# Patient Record
Sex: Female | Born: 1950 | Race: White | Hispanic: No | Marital: Married | Smoking: Current every day smoker
Health system: Southern US, Community
[De-identification: ages and names within clinical notes are randomized; demographics above are authoritative.]

## PROBLEM LIST (undated history)

## (undated) DIAGNOSIS — M797 Fibromyalgia: Secondary | ICD-10-CM

## (undated) DIAGNOSIS — F419 Anxiety disorder, unspecified: Secondary | ICD-10-CM

## (undated) DIAGNOSIS — E079 Disorder of thyroid, unspecified: Secondary | ICD-10-CM

## (undated) DIAGNOSIS — K219 Gastro-esophageal reflux disease without esophagitis: Secondary | ICD-10-CM

## (undated) DIAGNOSIS — IMO0002 Reserved for concepts with insufficient information to code with codable children: Secondary | ICD-10-CM

## (undated) DIAGNOSIS — E785 Hyperlipidemia, unspecified: Secondary | ICD-10-CM

## (undated) DIAGNOSIS — Z5189 Encounter for other specified aftercare: Secondary | ICD-10-CM

## (undated) DIAGNOSIS — M810 Age-related osteoporosis without current pathological fracture: Secondary | ICD-10-CM

## (undated) DIAGNOSIS — E119 Type 2 diabetes mellitus without complications: Secondary | ICD-10-CM

## (undated) DIAGNOSIS — F32A Depression, unspecified: Secondary | ICD-10-CM

## (undated) DIAGNOSIS — M1990 Unspecified osteoarthritis, unspecified site: Secondary | ICD-10-CM

## (undated) DIAGNOSIS — M329 Systemic lupus erythematosus, unspecified: Secondary | ICD-10-CM

## (undated) DIAGNOSIS — I10 Essential (primary) hypertension: Secondary | ICD-10-CM

## (undated) HISTORY — DX: Encounter for other specified aftercare: Z51.89

## (undated) HISTORY — DX: Unspecified osteoarthritis, unspecified site: M19.90

## (undated) HISTORY — DX: Age-related osteoporosis without current pathological fracture: M81.0

## (undated) HISTORY — DX: Anxiety disorder, unspecified: F41.9

## (undated) HISTORY — DX: Type 2 diabetes mellitus without complications: E11.9

## (undated) HISTORY — DX: Essential (primary) hypertension: I10

## (undated) HISTORY — DX: Gastro-esophageal reflux disease without esophagitis: K21.9

## (undated) HISTORY — DX: Fibromyalgia: M79.7

## (undated) HISTORY — DX: Hyperlipidemia, unspecified: E78.5

## (undated) HISTORY — DX: Disorder of thyroid, unspecified: E07.9

---

## 1991-11-07 HISTORY — PX: OTHER SURGICAL HISTORY: SHX169

## 2020-05-06 ENCOUNTER — Telehealth: Payer: Self-pay | Admitting: Endocrinology

## 2020-05-06 NOTE — Telephone Encounter (Signed)
Patient states she has only discussed Calcium with her PCP Dr Nyra Capes, and she has only been seeing him for less than a year. Patient moved here from [place], but Dr Dwyane Dee needed lab results for the past 2 years for Calcium for this patient and shes only had her labs done at Dr Nyra Capes office. Asked if Mrs Jasmine Keith could get those results please.   Dr Maryella Shivers Phone: [phone] [address] [place], [place] [address]

## 2020-05-07 NOTE — Telephone Encounter (Signed)
Called pt and got her old PCP providers name. His name is Dr. Boykin Peek and he is located in [place], [place] at the [facility].Their number is [phone] and fax number is [phone]. I called and spoke with Medical Records and they stated the way in which to obtain these labs needs to be a faxed request. This will be done today.

## 2020-05-14 ENCOUNTER — Ambulatory Visit: Payer: Medicare Other | Admitting: Endocrinology

## 2020-05-31 ENCOUNTER — Encounter: Payer: Self-pay | Admitting: Endocrinology

## 2020-05-31 ENCOUNTER — Other Ambulatory Visit: Payer: Self-pay

## 2020-05-31 ENCOUNTER — Ambulatory Visit (INDEPENDENT_AMBULATORY_CARE_PROVIDER_SITE_OTHER): Payer: Medicare Other | Admitting: Endocrinology

## 2020-05-31 DIAGNOSIS — Z780 Asymptomatic menopausal state: Secondary | ICD-10-CM

## 2020-05-31 NOTE — Progress Notes (Signed)
Patient ID: Jasmine Keith, female   DOB: January 22, 1951, 69 y.o.   MRN: [id number]            Chief complaint: High calcium  History of Present Illness:  Referring physician:   Review of records show that she has had a high calcium since at least 12/2018 At that time it was slightly above normal at 10.5 She also had a 11.0 calcium on 06/26/2019 Most recent is 11.2 done on 04/09/2020, at the same time albumin is 4.9  No results found for: CALCIUM  The hypercalcemia is not associated with any history of fractures, kidney problems, nephrolithiasis, sarcoidosis, known carcinoma or thyroid disease  She has had some pain in her lower back and sides and upper thighs but not any known fracture Has not had any history of height loss He did not take any calcium supplements or any herbal remedies  Prior PTH level in 2020 was 34, repeat 32, probably from [place] with upper normal 65  No results found for: PTH, CALCIUM, CAION, PHOS    25 (OH) Vitamin D level not available  No results found for: VD25OH  She has not had a bone density done  Allergies as of 05/31/2020   Not on File     Medication List       Accurate as of May 31, 2020  4:55 PM. If you have any questions, ask your nurse or doctor.        amLODipine 10 MG tablet Commonly known as: NORVASC Take 10 mg by mouth daily.   busPIRone 5 MG tablet Commonly known as: BUSPAR Take 5 mg by mouth daily.   fenofibrate 160 MG tablet Take 160 mg by mouth daily.   gabapentin 800 MG tablet Commonly known as: NEURONTIN Take 800 mg by mouth 2 (two) times daily.   LEVEMIR FLEXPEN [place] Inject 36 Units into the skin daily.   losartan 100 MG tablet Commonly known as: COZAAR Take 100 mg by mouth daily.   meclizine 25 MG tablet Commonly known as: ANTIVERT Take 25 mg by mouth as needed for dizziness.   metFORMIN 1000 MG tablet Commonly known as: GLUCOPHAGE Take 1,000 mg by mouth 2 (two) times daily with a meal.   nortriptyline  50 MG capsule Commonly known as: PAMELOR Take 50 mg by mouth at bedtime.   pantoprazole 40 MG tablet Commonly known as: PROTONIX Take 40 mg by mouth daily.   pravastatin 10 MG tablet Commonly known as: PRAVACHOL Take 10 mg by mouth daily.       Allergies: Not on File  History reviewed. No pertinent past medical history.  History reviewed. No pertinent surgical history.  Family History  Problem Relation Age of Onset  . Diabetes Mother   . Kidney disease Mother   . Diabetes Father   . Cancer Sister        lung  . Kidney disease Sister     Social History:  reports that she has been smoking cigarettes. She has been smoking about 0.50 packs per day. She has never used smokeless tobacco. No history on file for alcohol use and drug use.  Review of Systems  Constitutional: Negative for weight loss and weight gain.  Eyes: Negative for blurred vision.  Cardiovascular: Negative for leg swelling.  Gastrointestinal: Negative for constipation and diarrhea.  Endocrine: Negative for general weakness.       Has had diabetes 10 yrs..  Managed by PCP, currently on Levemir insulin with Metformin.  Last A1c  7.1 in 6/21  Musculoskeletal: Positive for joint pain.  Neurological: Negative for numbness and tingling.     EXAM:  BP (!) 140/72 (BP Location: Left Arm, Patient Position: Sitting, Cuff Size: Normal)   Pulse 93   Ht 5\' 1"  (1.549 m)   Wt 114 lb 12.8 oz (52.1 kg)   SpO2 96%   BMI 21.69 kg/m   GENERAL: Averagely built and nourished  No pallor, clubbing, lymphadenopathy or edema.    Skin:  no rash or pigmentation.  EYES:  Externally normal.   ENT: Exam deferred.  THYROID:  Not palpable.   HEART:  Normal  S1 and S2; no murmur or click.  CHEST:  Normal shape Lungs:   Vescicular breath sounds heard equally.  No crepitations/ wheeze.  ABDOMEN:  No distention.  Liver and spleen not palpable.   No other mass or tenderness.  NEUROLOGICAL: .Reflexes are normal  bilaterally at biceps.  SPINE AND JOINTS: Spine appears normal to inspection  Some [place] nodes present on the fingers.  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  Discussed potential  effects of hyperparathyroidism long-term on bone health, kidney stones and kidney function Explained to patient that surgery is indicated only there are symptoms of high calcium, calcium level over 1 point above the normal range or known osteoporosis.  Plan:  Check ionized calcium  Repeat PTH  Check vitamin D level  Bone density  Further management will depend on results of current evaluation  DIABETES: She is being followed by PCP and is asking about her A1c targets and this was explained Also explained to her that Medicare will not cover CGM like freestyle libre unless she is on multiple insulin injections and requiring more frequent monitoring  Jasmine Keith 05/31/2020, 4:55 PM

## 2020-06-02 LAB — PTH, INTACT AND CALCIUM
Calcium: 10.7 mg/dL — ABNORMAL HIGH (ref 8.7–10.3)
PTH: 45 pg/mL (ref 15–65)

## 2020-06-02 LAB — CALCIUM, IONIZED: Calcium, Ion: 5.8 mg/dL — ABNORMAL HIGH (ref 4.5–5.6)

## 2020-06-23 ENCOUNTER — Ambulatory Visit (INDEPENDENT_AMBULATORY_CARE_PROVIDER_SITE_OTHER)
Admission: RE | Admit: 2020-06-23 | Discharge: 2020-06-23 | Disposition: A | Discharge status: Home / Self Care | Payer: Medicare Other | Source: Ambulatory Visit | Attending: Endocrinology | Admitting: Endocrinology

## 2020-06-23 ENCOUNTER — Other Ambulatory Visit: Payer: Self-pay

## 2020-06-23 DIAGNOSIS — Z780 Asymptomatic menopausal state: Secondary | ICD-10-CM | POA: Diagnosis not present

## 2020-07-05 NOTE — Progress Notes (Signed)
Please call to let patient know that the bone density shows borderline osteoporosis at the hip.  Recommend starting alendronate 70 mg weekly for reducing fracture risk in the future.  This will be taken with water on empty stomach.  Can discuss further if needed

## 2020-07-08 ENCOUNTER — Encounter: Payer: Self-pay | Admitting: *Deleted

## 2020-07-13 ENCOUNTER — Other Ambulatory Visit: Payer: Self-pay | Admitting: *Deleted

## 2020-07-13 MED ORDER — ALENDRONATE SODIUM 70 MG PO TABS: 70.0000 mg | ORAL_TABLET | ORAL | 0 refills | Status: DC

## 2020-07-13 NOTE — Telephone Encounter (Signed)
Patient called regarding lab results letter.Prescription sent .  Patient verbalized understanding and would like to know if scan showed anything to worry about with her thyroid.

## 2020-07-23 ENCOUNTER — Encounter: Payer: Self-pay | Admitting: Medical

## 2020-07-23 ENCOUNTER — Other Ambulatory Visit: Payer: Self-pay

## 2020-07-23 ENCOUNTER — Ambulatory Visit (INDEPENDENT_AMBULATORY_CARE_PROVIDER_SITE_OTHER): Payer: Medicare Other | Admitting: Medical

## 2020-07-23 ENCOUNTER — Ambulatory Visit (HOSPITAL_BASED_OUTPATIENT_CLINIC_OR_DEPARTMENT_OTHER)
Admission: RE | Admit: 2020-07-23 | Discharge: 2020-07-23 | Disposition: A | Discharge status: Home / Self Care | Payer: Medicare Other | Source: Ambulatory Visit | Attending: Medical | Admitting: Medical

## 2020-07-23 VITALS — BP 133/65 | HR 94 | Temp 98.7°F | Resp 18 | Ht 61.0 in | Wt 116.2 lb

## 2020-07-23 DIAGNOSIS — E119 Type 2 diabetes mellitus without complications: Secondary | ICD-10-CM | POA: Diagnosis not present

## 2020-07-23 DIAGNOSIS — M069 Rheumatoid arthritis, unspecified: Secondary | ICD-10-CM

## 2020-07-23 DIAGNOSIS — F419 Anxiety disorder, unspecified: Secondary | ICD-10-CM

## 2020-07-23 DIAGNOSIS — I10 Essential (primary) hypertension: Secondary | ICD-10-CM | POA: Diagnosis not present

## 2020-07-23 DIAGNOSIS — M25559 Pain in unspecified hip: Secondary | ICD-10-CM | POA: Diagnosis present

## 2020-07-23 DIAGNOSIS — M797 Fibromyalgia: Secondary | ICD-10-CM

## 2020-07-23 DIAGNOSIS — R61 Generalized hyperhidrosis: Secondary | ICD-10-CM

## 2020-07-23 DIAGNOSIS — M810 Age-related osteoporosis without current pathological fracture: Secondary | ICD-10-CM

## 2020-07-23 DIAGNOSIS — G4700 Insomnia, unspecified: Secondary | ICD-10-CM

## 2020-07-23 DIAGNOSIS — K219 Gastro-esophageal reflux disease without esophagitis: Secondary | ICD-10-CM

## 2020-07-23 DIAGNOSIS — E785 Hyperlipidemia, unspecified: Secondary | ICD-10-CM

## 2020-07-23 MED ORDER — ALENDRONATE SODIUM 70 MG PO TABS: 70.0000 mg | ORAL_TABLET | ORAL | 3 refills | Status: DC

## 2020-07-23 NOTE — Progress Notes (Signed)
   Subjective:    Patient ID: Jasmine Keith, female    DOB: 09-12-1951, 69 y.o.   MRN: [id number]  HPI  Pt in for first time.  Pt is retired. Was working in nursing facility. She does not exercise.  Pt states diet healthy most of the time. 2 cups of coffee in am. Pt current smoker. Hx of smoker since 69 yo. Pack a day on average. Rare alcohol use.    Pt states rt hip pain prevents her from exercise.   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 Continue meloxicam.  Also hx of fibromyalgia.  Hx of htn. Continue amlodipine   For gerd on protonix.   For high choleterol on fenofribtate and pravastin.  For insomnia continue pamelor.  For anxiety continue buspar.  For osteoporosis rx fosamax.  Pt has diabetes. Pt is on levemir 36 units at night. Pt not sure when a1c was. She   Pt has had covid vacine.    Review of Systems     Objective:   Physical Exam  General Mental Status- Alert. General Appearance- Not in acute distress.   Skin General: Color- Normal Color. Moisture- Normal Moisture.  Neck  No JVD. Chest and Lung Exam Auscultation: Breath Sounds:-Normal.  Cardiovascular Auscultation:Rythm- Regular. Murmurs & Other Heart Sounds:Auscultation of the heart reveals- No Murmurs.  Abdomen Inspection:-Inspeection Normal. Palpation/Percussion:Note:No mass. Palpation and Percussion of the abdomen reveal- Non Tender, Non Distended + BS, no rebound or guarding.    Neurologic Cranial Nerve exam:- CN III-XII intact(No nystagmus), symmetric smile. Strength:- 5/5 equal and symmetric strength both upper and lower extremities.  Right hip-pain on range of motion.     Assessment & Plan:  Nice to meet you.  History of hypertension.  Blood pressure well controlled today.  Continue amlodipine.  History of osteoporosis.  Recent your Fosamax prescription to your pharmacy.  History of diabetes but I do not know when your last A1c was.  You report last A1c was  just above 7.0.  Continue current Levemir.  I want you to call your prior PCP and find out date of last A1c.  If more than 90 days then go ahead and get scheduled for CMP, lipid panel, A1c and CBC.  Want the labs to be done fasting.  The need to get scheduled for those labs.  For anxiety which is presently controlled recommend continue BuSpar.  For fibromyalgia history continue gabapentin.  For GERD continue Protonix.  For history of insomnia continue Pamelor.  High cholesterol continue fenofibrate and pravastatin.  We will follow fasting lipid panel when you get that done.    For right hip pain we will go ahead and get his right hip x-ray today.  You mention at the end of the interview random sweating during the day.  When asked that you check your temperature to see if you have a fever.  Need to know this first as this would change work-up.  Also go ahead and get CBC to assess your infection fighting cells.  For history of smoking strongly recommend discontinuing smoking.  Follow-up in 2 months or as needed.  Time spent with patient today was 47 minutes which consisted of chart rediew, discussing diagnoses, work up,  Treatment, answering various questions and documentation.

## 2020-07-23 NOTE — Patient Instructions (Addendum)
Nice to meet you.  History of hypertension.  Blood pressure well controlled today.  Continue amlodipine.  History of osteoporosis.  Recent your Fosamax prescription to your pharmacy.  History of diabetes but I do not know when your last A1c was.  You report last A1c was just above 7.0.  Continue current Levemir.  I want you to call your prior PCP and find out date of last A1c.  If more than 90 days then go ahead and get scheduled for CMP, lipid panel, A1c and CBC.  Want the labs to be done fasting.  The need to get scheduled for those labs.  For anxiety which is presently controlled recommend continue BuSpar.  For fibromyalgia history continue gabapentin.  For GERD continue Protonix.  For history of insomnia continue Pamelor.  High cholesterol continue fenofibrate and pravastatin.  We will follow fasting lipid panel when you get that done.    For right hip pain we will go ahead and get his right hip x-ray today.  You mention at the end of the interview random sweating during the day.  When asked that you check your temperature to see if you have a fever.  Need to know this first as this would change work-up.  Also go ahead and get CBC to assess your infection fighting cells.  For history of smoking strongly recommend discontinuing smoking.  Follow-up in 2 months or as needed.

## 2020-09-22 ENCOUNTER — Encounter: Payer: Self-pay | Admitting: Medical

## 2020-09-22 ENCOUNTER — Other Ambulatory Visit: Payer: Self-pay

## 2020-09-22 ENCOUNTER — Ambulatory Visit (INDEPENDENT_AMBULATORY_CARE_PROVIDER_SITE_OTHER): Payer: Medicare Other | Admitting: Medical

## 2020-09-22 VITALS — BP 150/70 | HR 97 | Resp 18 | Ht 61.0 in | Wt 114.0 lb

## 2020-09-22 DIAGNOSIS — I10 Essential (primary) hypertension: Secondary | ICD-10-CM

## 2020-09-22 DIAGNOSIS — M810 Age-related osteoporosis without current pathological fracture: Secondary | ICD-10-CM

## 2020-09-22 DIAGNOSIS — E119 Type 2 diabetes mellitus without complications: Secondary | ICD-10-CM

## 2020-09-22 DIAGNOSIS — M797 Fibromyalgia: Secondary | ICD-10-CM | POA: Diagnosis not present

## 2020-09-22 DIAGNOSIS — E785 Hyperlipidemia, unspecified: Secondary | ICD-10-CM

## 2020-09-22 MED ORDER — INSULIN DETEMIR 100 UNIT/ML FLEXPEN: SUBCUTANEOUS | 0 refills | Status: DC

## 2020-09-22 MED ORDER — BUSPIRONE HCL 7.5 MG PO TABS: 7.5000 mg | ORAL_TABLET | Freq: Two times a day (BID) | ORAL | 0 refills | Status: DC

## 2020-09-22 MED ORDER — METOPROLOL SUCCINATE ER 25 MG PO TB24: 25.0000 mg | ORAL_TABLET | Freq: Every day | ORAL | 3 refills | Status: DC

## 2020-09-22 NOTE — Progress Notes (Signed)
Subjective:    Patient ID: Jasmine Keith, female    DOB: [birth year], 69 y.o.   MRN: [id number]  HPI Pt in for follow up.  Pt has htn. BP high here. Has not been checking her bp at home. They think machine 68 year old. On amlodipine 10 mg daily.  Pt has diabetes last a1c was 7.1 early June. On levemir 36 units at night.    Pt has osteoporosis. Pt did get fosamax filled.  Pt has anxiety. She is on buspar 5 mg twice a day. Started by Dr. Nyra Capes. She states nervous person. She thinks anxiety not controlled.  Has fibromyalgia and use gabapentin 800 mg twice daily.  Has high cholesterol. On fenofibrate.    Review of Systems  Constitutional: Negative for activity change, chills, diaphoresis, fatigue and fever.  Respiratory: Negative for cough, chest tightness and shortness of breath.   Cardiovascular: Negative for chest pain, palpitations and leg swelling.  Gastrointestinal: Negative for abdominal pain, blood in stool, nausea and vomiting.  Genitourinary: Negative for dysuria.  Musculoskeletal: Negative for back pain, neck pain and neck stiffness.  Neurological: Negative for dizziness, seizures, weakness and headaches.  Psychiatric/Behavioral: Negative for agitation, behavioral problems, confusion, dysphoric mood, sleep disturbance and suicidal ideas. The patient is nervous/anxious.    Past Medical History:  Diagnosis Date  . Anxiety   . Diabetes mellitus without complication ([place])   . Fibromyalgia   . GERD (gastroesophageal reflux disease)   . Hyperlipidemia   . Hypertension   . Osteoporosis      Social History   Socioeconomic History  . Marital status: Married    Spouse name: Not on file  . Number of children: Not on file  . Years of education: Not on file  . Highest education level: Not on file  Occupational History  . Not on file  Tobacco Use  . Smoking status: Current Every Day Smoker    Packs/day: 0.50    Types: Cigarettes  . Smokeless tobacco: Never  Used  Substance and Sexual Activity  . Alcohol use: Yes    Comment: Rare  . Drug use: Not on file  . Sexual activity: Not on file  Other Topics Concern  . Not on file  Social History Narrative  . Not on file   Social Determinants of Health   Financial Resource Strain:   . Difficulty of Paying Living Expenses: Not on file  Food Insecurity:   . Worried About Charity fundraiser in the Last Year: Not on file  . Ran Out of Food in the Last Year: Not on file  Transportation Needs:   . Lack of Transportation (Medical): Not on file  . Lack of Transportation (Non-Medical): Not on file  Physical Activity:   . Days of Exercise per Week: Not on file  . Minutes of Exercise per Session: Not on file  Stress:   . Feeling of Stress : Not on file  Social Connections:   . Frequency of Communication with Friends and Family: Not on file  . Frequency of Social Gatherings with Friends and Family: Not on file  . Attends Religious Services: Not on file  . Active Member of Clubs or Organizations: Not on file  . Attends Archivist Meetings: Not on file  . Marital Status: Not on file  Intimate Partner Violence:   . Fear of Current or Ex-Partner: Not on file  . Emotionally Abused: Not on file  . Physically Abused: Not on file  .  Sexually Abused: Not on file    No past surgical history on file.  Family History  Problem Relation Age of Onset  . Diabetes Mother   . Kidney disease Mother   . Diabetes Father   . Cancer Sister        lung  . Kidney disease Sister     Not on File  Current Outpatient Medications on File Prior to Visit  Medication Sig Dispense Refill  . alendronate (FOSAMAX) 70 MG tablet Take 1 tablet (70 mg total) by mouth once a week. Take with a full glass of water on an empty stomach. 12 tablet 3  . amLODipine (NORVASC) 10 MG tablet Take 10 mg by mouth daily.    . busPIRone (BUSPAR) 5 MG tablet Take 5 mg by mouth daily.    . fenofibrate 160 MG tablet Take 160 mg  by mouth daily.    Marland Kitchen gabapentin (NEURONTIN) 800 MG tablet Take 800 mg by mouth 2 (two) times daily.    . Insulin Detemir (LEVEMIR FLEXPEN [place]) Inject 36 Units into the skin daily.    Marland Kitchen losartan (COZAAR) 100 MG tablet Take 100 mg by mouth daily.    . meclizine (ANTIVERT) 25 MG tablet Take 25 mg by mouth as needed for dizziness.    . meloxicam (MOBIC) 7.5 MG tablet Take 7.5 mg by mouth daily as needed.    . metFORMIN (GLUCOPHAGE) 1000 MG tablet Take 1,000 mg by mouth 2 (two) times daily with a meal.    . nortriptyline (PAMELOR) 50 MG capsule Take 50 mg by mouth at bedtime.    . pantoprazole (PROTONIX) 40 MG tablet Take 40 mg by mouth daily.    . pravastatin (PRAVACHOL) 10 MG tablet Take 10 mg by mouth daily.     No current facility-administered medications on file prior to visit.    BP (!) 150/70   Pulse 97   Resp 18   Ht 5\' 1"  (1.549 m)   Wt 114 lb (51.7 kg)   SpO2 99%   BMI 21.54 kg/m      Objective:   Physical Exam  General Mental Status- Alert. General Appearance- Not in acute distress.   Skin General: Color- Normal Color. Moisture- Normal Moisture.  Neck Carotid Arteries- Normal color. Moisture- Normal Moisture. No carotid bruits. No JVD.  Chest and Lung Exam Auscultation: Breath Sounds:-Normal.  Cardiovascular Auscultation:Rythm- Regular. Murmurs & Other Heart Sounds:Auscultation of the heart reveals- No Murmurs.  Abdomen Inspection:-Inspeection Normal. Palpation/Percussion:Note:No mass. Palpation and Percussion of the abdomen reveal- Non Tender, Non Distended + BS, no rebound or guarding.    Neurologic Cranial Nerve exam:- CN III-XII intact(No nystagmus), symmetric smile. Drift Test:- No drift. Romberg Exam:- Negative.  Heal to Toe Gait exam:-Normal. Finger to Nose:- Normal/Intact Strength:- 5/5 equal and symmetric strength both upper and lower extremities.      Assessment & Plan:  Your blood pressure is elevated today.  Not adequately controlled with  losartan and amlodipine.  We will add low-dose metoprolol to your regimen.  He recommended to get new blood pressure cuff and check your blood pressures daily.  Call our office in 1 week and give me update BP reading.  For history of osteoporosis continue on Fosamax.  History of diabetes.  Last A1c in the summer was 7.1.  Will repeat U7O and get metabolic panel today.  Refilled Levemir today.  Follow A1c results and see if we have to increase number of units.  History of fibromyalgia continue with gabapentin.  Anxiety not ideally controlled with current 5 mg BuSpar.  Will increase BuSpar to 7.5 mg twice daily.  For hyperlipidemia continue fenofibrate.  Not fasting today so we will get fasting lipid panel on next visit.  Follow-up date to be determined after lab review.

## 2020-09-22 NOTE — Patient Instructions (Signed)
Your blood pressure is elevated today.  Not adequately controlled with losartan and amlodipine.  We will add low-dose metoprolol to your regimen.  He recommended to get new blood pressure cuff and check your blood pressures daily.  Call our office in 1 week and give me update BP reading.  For history of osteoporosis continue on Fosamax.  History of diabetes.  Last A1c in the summer was 7.1.  Will repeat Z6W and get metabolic panel today.  Refilled Levemir today.  Follow A1c results and see if we have to increase number of units.  History of fibromyalgia continue with gabapentin.  Anxiety not ideally controlled with current 5 mg BuSpar.  Will increase BuSpar to 7.5 mg twice daily.  For hyperlipidemia continue fenofibrate.  Not fasting today so we will get fasting lipid panel on next visit.  Follow-up date to be determined after lab review.

## 2020-09-23 ENCOUNTER — Telehealth: Payer: Self-pay | Admitting: Medical

## 2020-09-23 LAB — COMPREHENSIVE METABOLIC PANEL
ALT: 16 U/L (ref 0–35)
AST: 20 U/L (ref 0–37)
Albumin: 4.6 g/dL (ref 3.5–5.2)
Alkaline Phosphatase: 43 U/L (ref 39–117)
BUN: 20 mg/dL (ref 6–23)
CO2: 24 mEq/L (ref 19–32)
Calcium: 10.8 mg/dL — ABNORMAL HIGH (ref 8.4–10.5)
Chloride: 103 mEq/L (ref 96–112)
Creatinine, Ser: 1.03 mg/dL (ref 0.40–1.20)
GFR: 56.11 mL/min — ABNORMAL LOW (ref >60.00)
Glucose, Bld: 166 mg/dL — ABNORMAL HIGH (ref 70–99)
Potassium: 4.8 mEq/L (ref 3.5–5.1)
Sodium: 137 mEq/L (ref 135–145)
Total Bilirubin: 0.4 mg/dL (ref 0.2–1.2)
Total Protein: 7.2 g/dL (ref 6.0–8.3)

## 2020-09-23 LAB — HEMOGLOBIN A1C: Hgb A1c MFr Bld: 6.7 % — ABNORMAL HIGH (ref 4.6–6.5)

## 2020-09-23 NOTE — Telephone Encounter (Signed)
Future pth and calcium placed.

## 2020-10-15 ENCOUNTER — Other Ambulatory Visit: Payer: Self-pay | Admitting: Medical

## 2020-11-29 ENCOUNTER — Telehealth: Payer: Self-pay | Admitting: Endocrinology

## 2020-11-29 ENCOUNTER — Telehealth: Payer: Self-pay | Admitting: Medical

## 2020-11-29 ENCOUNTER — Other Ambulatory Visit: Payer: Self-pay | Admitting: *Deleted

## 2020-11-29 MED ORDER — LOSARTAN POTASSIUM 100 MG PO TABS: 100.0000 mg | ORAL_TABLET | Freq: Every day | ORAL | 3 refills | Status: DC

## 2020-11-29 MED ORDER — NORTRIPTYLINE HCL 50 MG PO CAPS
50.0000 mg | ORAL_CAPSULE | Freq: Every day | ORAL | 0 refills | Status: DC
Start: 2020-11-29 — End: 2021-03-09

## 2020-11-29 MED ORDER — GABAPENTIN 800 MG PO TABS: 800.0000 mg | ORAL_TABLET | Freq: Two times a day (BID) | ORAL | 2 refills | Status: DC

## 2020-11-29 MED ORDER — INSULIN DETEMIR 100 UNIT/ML FLEXPEN: SUBCUTANEOUS | 0 refills | Status: DC

## 2020-11-29 MED ORDER — METFORMIN HCL 1000 MG PO TABS: 1000.0000 mg | ORAL_TABLET | Freq: Two times a day (BID) | ORAL | 3 refills | Status: DC

## 2020-11-29 NOTE — Telephone Encounter (Signed)
All her medications are being prescribed by PCP

## 2020-11-29 NOTE — Telephone Encounter (Signed)
Noted  

## 2020-11-29 NOTE — Telephone Encounter (Signed)
Medication: insulin detemir (LEVEMIR) 100 unit/ml SOLN [[id number]  insulin detemir (LEVEMIR) 100 unit/ml SOLN    Has the patient contacted their pharmacy? No. (If no, request that the patient contact the pharmacy for the refill.) (If yes, when and what did the pharmacy advise?)  Preferred Pharmacy (with phone number or street name):  [id number] - [place], [address] Phone:  [phone]  Fax:  [phone]       Agent: Please be advised that RX refills may take up to 3 business days. We ask that you follow-up with your pharmacy.

## 2020-11-29 NOTE — Telephone Encounter (Signed)
Refilled pt losartan, gabapentin, pamelor and metformin. Please get her to follow up with me by end of February.

## 2020-11-29 NOTE — Telephone Encounter (Signed)
Patient called request new RX's for the following medications: metFORMIN (GLUCOPHAGE) 1000 MG tablet  gabapentin (NEURONTIN) 800 MG tablet  nortriptyline (PAMELOR) 50 MG capsule   AND   losartan (COZAAR) 100 MG tablet  Be sent to:  [id number] - [place], [place] - [place] [address] Phone:  [phone]  Fax:  [phone]

## 2020-11-29 NOTE — Telephone Encounter (Signed)
I do not see that you have prescribed any of these medications for this patient before?  Please advise if okay to fill or deny?

## 2020-11-29 NOTE — Addendum Note (Signed)
Addended by: Jeronimo Greaves on: 11/29/2020 01:35 PM   Modules accepted: Orders

## 2020-11-29 NOTE — Addendum Note (Signed)
Addended by: Anabel Halon on: 11/29/2020 05:26 PM   Modules accepted: Orders

## 2020-11-29 NOTE — Telephone Encounter (Signed)
Rx sent 

## 2020-11-30 NOTE — Telephone Encounter (Signed)
Called pt and lvm to return call.  

## 2020-12-01 NOTE — Telephone Encounter (Signed)
Pt scheduled 12/29/2020.

## 2020-12-02 ENCOUNTER — Other Ambulatory Visit: Payer: Self-pay | Admitting: *Deleted

## 2020-12-02 ENCOUNTER — Ambulatory Visit (INDEPENDENT_AMBULATORY_CARE_PROVIDER_SITE_OTHER): Payer: Medicare Other | Admitting: Endocrinology

## 2020-12-02 ENCOUNTER — Encounter: Payer: Self-pay | Admitting: Endocrinology

## 2020-12-02 ENCOUNTER — Other Ambulatory Visit: Payer: Self-pay

## 2020-12-02 DIAGNOSIS — R5383 Other fatigue: Secondary | ICD-10-CM

## 2020-12-02 DIAGNOSIS — M85859 Other specified disorders of bone density and structure, unspecified thigh: Secondary | ICD-10-CM | POA: Insufficient documentation

## 2020-12-02 HISTORY — DX: Other fatigue: R53.83

## 2020-12-02 HISTORY — DX: Hypercalcemia: E83.52

## 2020-12-02 HISTORY — DX: Other specified disorders of bone density and structure, unspecified thigh: M85.859

## 2020-12-02 LAB — BASIC METABOLIC PANEL
BUN: 16 mg/dL (ref 6–23)
CO2: 26 mEq/L (ref 19–32)
Calcium: 10 mg/dL (ref 8.4–10.5)
Chloride: 106 mEq/L (ref 96–112)
Creatinine, Ser: 0.92 mg/dL (ref 0.40–1.20)
GFR: 64.16 mL/min (ref >60.00)
Glucose, Bld: 161 mg/dL — ABNORMAL HIGH (ref 70–99)
Potassium: 3.9 mEq/L (ref 3.5–5.1)
Sodium: 139 mEq/L (ref 135–145)

## 2020-12-02 LAB — VITAMIN D 25 HYDROXY (VIT D DEFICIENCY, FRACTURES): VITD: 29.84 ng/mL — ABNORMAL LOW (ref 30.00–100.00)

## 2020-12-02 LAB — TSH: TSH: 4.72 u[IU]/mL — ABNORMAL HIGH (ref 0.35–4.50)

## 2020-12-02 LAB — T4, FREE: Free T4: 0.88 ng/dL (ref 0.60–1.60)

## 2020-12-02 MED ORDER — BD PEN NEEDLE NANO U/F 32G X 4 MM MISC: 5 refills | Status: DC

## 2020-12-02 NOTE — Progress Notes (Addendum)
Patient ID: MILIA Keith, female   DOB: 02/11/1951, 70 y.o.   MRN: [id number]            Chief complaint: Follow-up of high calcium  History of Present Illness:    Review of records show that she has had a high calcium since at least 12/2018 At that time it was slightly above normal at 10.5 She also had a 11.0 calcium on 06/26/2019 Highest level is 11.2 done on 04/09/2020, at the same time albumin is 4.9  Lab Results  Component Value Date   CALCIUM 10.8 (H) 09/22/2020   CALCIUM 10.7 (H) 05/31/2020    The hypercalcemia is not associated with any history of fractures, kidney problems, nephrolithiasis, sarcoidosis, known carcinoma or thyroid disease  She does have OSTEOPENIA For this she was started on Fosamax 70 mg weekly in 06/2020  She did not take any calcium supplements or vitamin D  Previous PTH level in 2020 was 34, repeat 32, probably from [name] with upper normal 65  Lab Results  Component Value Date   PTH 45 05/31/2020   PTH Comment 05/31/2020   CALCIUM 10.8 (H) 09/22/2020   CAION 5.8 (H) 05/31/2020      25 (OH) Vitamin D level not available  No results found for: VD25OH  BONE density results:  Results: 06/23/2020 Lumbar spine L1-L4 Femoral neck (FN) 33% left distal radius  T-score -0.3 RFN: -2.2 LFN: -2.3      Allergies as of 12/02/2020   No Known Allergies     Medication List       Accurate as of December 02, 2020  1:39 PM. If you have any questions, ask your nurse or doctor.        alendronate 70 MG tablet Commonly known as: FOSAMAX Take 1 tablet (70 mg total) by mouth once a week. Take with a full glass of water on an empty stomach.   amLODipine 10 MG tablet Commonly known as: NORVASC Take 10 mg by mouth daily.   BD Pen Needle Nano U/F 32G X 4 MM Misc Generic drug: Insulin Pen Needle Use one needle daily to inject insulin What changed:   how to take this  additional instructions Changed by: PEDRAZA, RHONDA, CMA   busPIRone 5 MG  tablet Commonly known as: BUSPAR Take 5 mg by mouth daily.   busPIRone 7.5 MG tablet Commonly known as: BUSPAR TAKE 1 TABLET BY MOUTH 2 TIMES DAILY.   fenofibrate 160 MG tablet Take 160 mg by mouth daily.   gabapentin 800 MG tablet Commonly known as: NEURONTIN Take 1 tablet (800 mg total) by mouth 2 (two) times daily.   LEVEMIR FLEXPEN [place] Inject 36 Units into the skin daily.   insulin detemir 100 unit/ml Soln Commonly known as: LEVEMIR 36 units at night.   losartan 100 MG tablet Commonly known as: COZAAR Take 1 tablet (100 mg total) by mouth daily.   meclizine 25 MG tablet Commonly known as: ANTIVERT Take 25 mg by mouth as needed for dizziness.   meloxicam 7.5 MG tablet Commonly known as: MOBIC Take 7.5 mg by mouth daily as needed.   metFORMIN 1000 MG tablet Commonly known as: GLUCOPHAGE Take 1 tablet (1,000 mg total) by mouth 2 (two) times daily with a meal.   metoprolol succinate 25 MG 24 hr tablet Commonly known as: TOPROL-XL Take 1 tablet (25 mg total) by mouth daily.   nortriptyline 50 MG capsule Commonly known as: PAMELOR Take 1 capsule (50 mg total) by mouth  at bedtime.   pantoprazole 40 MG tablet Commonly known as: PROTONIX Take 40 mg by mouth daily.   pravastatin 10 MG tablet Commonly known as: PRAVACHOL Take 10 mg by mouth daily.       Allergies: No Known Allergies  Past Medical History:  Diagnosis Date  . Anxiety   . Diabetes mellitus without complication ([place])   . Fibromyalgia   . GERD (gastroesophageal reflux disease)   . Hyperlipidemia   . Hypertension   . Osteoporosis     No past surgical history on file.  Family History  Problem Relation Age of Onset  . Diabetes Mother   . Kidney disease Mother   . Diabetes Father   . Cancer Sister        lung  . Kidney disease Sister     Social History:  reports that she has been smoking cigarettes. She has been smoking about 0.50 packs per day. She has never used smokeless tobacco.  She reports current alcohol use. No history on file for drug use.  Review of Systems   She is being managed by her PCP in follow-up for diabetes on Levemir and Metformin  She thinks her blood sugars are fairly good but checking infrequently  Lab Results  Component Value Date   HGBA1C 6.7 (H) 09/22/2020   Lab Results  Component Value Date   CREATININE 1.03 09/22/2020   FATIGUE: She says she is having significant fatigue and not clear why this is so, no history of anemia No thyroid levels available  No results found for: TSH, FREET4   EXAM:  BP 130/70   Pulse 82   Ht 5\' 1"  (1.549 m)   Wt 117 lb 6.4 oz (53.3 kg)   SpO2 99%   BMI 22.18 kg/m     Assessment/Plan:   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:  She has had mild relatively chronic hypercalcemia with last level under 11  OSTEOPENIA: Her T score is -2.2 at the hip she is on Fosamax prophylactically because of associated hyperparathyroid  Plan:  Check calcium again  Check vitamin D level  Bone density in 06/2022  Continue Fosamax for at least a total of 5 years  DIABETES: She is being followed by PCP and can continue to follow-up with him as scheduled If request for consultation is made by PCP would be glad to see her for this also  FATIGUE: We will rule out hypothyroidism with free T4 and TSH levels  Jasmine Keith 12/02/2020, 1:39 PM   Addendum: Vitamin D level slightly low, start vitamin D3 1000 units daily Also TSH mildly increased, can try levothyroxine 25 mcg daily and follow-up in 2 months with labs  Jasmine Keith Dwyane Dee

## 2020-12-16 ENCOUNTER — Other Ambulatory Visit: Payer: Self-pay | Admitting: Medical

## 2020-12-29 ENCOUNTER — Ambulatory Visit: Payer: BLUE CROSS/BLUE SHIELD | Admitting: Medical

## 2021-01-03 ENCOUNTER — Telehealth: Payer: Self-pay | Admitting: Medical

## 2021-01-03 ENCOUNTER — Other Ambulatory Visit: Payer: Self-pay

## 2021-01-03 ENCOUNTER — Ambulatory Visit (INDEPENDENT_AMBULATORY_CARE_PROVIDER_SITE_OTHER): Payer: Medicare Other | Admitting: Medical

## 2021-01-03 ENCOUNTER — Encounter: Payer: Self-pay | Admitting: Medical

## 2021-01-03 VITALS — BP 132/60 | HR 77 | Temp 98.3°F | Resp 20 | Ht 61.0 in | Wt 114.2 lb

## 2021-01-03 DIAGNOSIS — G4700 Insomnia, unspecified: Secondary | ICD-10-CM

## 2021-01-03 DIAGNOSIS — R197 Diarrhea, unspecified: Secondary | ICD-10-CM

## 2021-01-03 DIAGNOSIS — I10 Essential (primary) hypertension: Secondary | ICD-10-CM

## 2021-01-03 DIAGNOSIS — F32A Depression, unspecified: Secondary | ICD-10-CM

## 2021-01-03 DIAGNOSIS — E119 Type 2 diabetes mellitus without complications: Secondary | ICD-10-CM | POA: Diagnosis not present

## 2021-01-03 DIAGNOSIS — E785 Hyperlipidemia, unspecified: Secondary | ICD-10-CM

## 2021-01-03 DIAGNOSIS — Z1211 Encounter for screening for malignant neoplasm of colon: Secondary | ICD-10-CM

## 2021-01-03 DIAGNOSIS — R5383 Other fatigue: Secondary | ICD-10-CM | POA: Diagnosis not present

## 2021-01-03 DIAGNOSIS — R7989 Other specified abnormal findings of blood chemistry: Secondary | ICD-10-CM | POA: Diagnosis not present

## 2021-01-03 LAB — CBC WITH DIFFERENTIAL/PLATELET
Basophils Absolute: 0.1 10*3/uL (ref 0.0–0.1)
Basophils Relative: 1.1 % (ref 0.0–3.0)
Eosinophils Absolute: 0.1 10*3/uL (ref 0.0–0.7)
Eosinophils Relative: 1.6 % (ref 0.0–5.0)
HCT: 34.8 % — ABNORMAL LOW (ref 36.0–46.0)
Hemoglobin: 11.6 g/dL — ABNORMAL LOW (ref 12.0–15.0)
Lymphocytes Relative: 27.3 % (ref 12.0–46.0)
Lymphs Abs: 1.9 10*3/uL (ref 0.7–4.0)
MCHC: 33.3 g/dL (ref 30.0–36.0)
MCV: 79.6 fl (ref 78.0–100.0)
Monocytes Absolute: 0.3 10*3/uL (ref 0.1–1.0)
Monocytes Relative: 4.9 % (ref 3.0–12.0)
Neutro Abs: 4.5 10*3/uL (ref 1.4–7.7)
Neutrophils Relative %: 65.1 % (ref 43.0–77.0)
Platelets: 390 10*3/uL (ref 150.0–400.0)
RBC: 4.37 Mil/uL (ref 3.87–5.11)
RDW: 14.6 % (ref 11.5–15.5)
WBC: 7 10*3/uL (ref 4.0–10.5)

## 2021-01-03 LAB — COMPREHENSIVE METABOLIC PANEL
ALT: 12 U/L (ref 0–35)
AST: 16 U/L (ref 0–37)
Albumin: 4.4 g/dL (ref 3.5–5.2)
Alkaline Phosphatase: 44 U/L (ref 39–117)
BUN: 16 mg/dL (ref 6–23)
CO2: 24 mEq/L (ref 19–32)
Calcium: 10.5 mg/dL (ref 8.4–10.5)
Chloride: 103 mEq/L (ref 96–112)
Creatinine, Ser: 0.83 mg/dL (ref 0.40–1.20)
GFR: 72.55 mL/min (ref >60.00)
Glucose, Bld: 144 mg/dL — ABNORMAL HIGH (ref 70–99)
Potassium: 4.5 mEq/L (ref 3.5–5.1)
Sodium: 137 mEq/L (ref 135–145)
Total Bilirubin: 0.4 mg/dL (ref 0.2–1.2)
Total Protein: 7 g/dL (ref 6.0–8.3)

## 2021-01-03 LAB — LIPID PANEL
Cholesterol: 257 mg/dL — ABNORMAL HIGH (ref 0–200)
HDL: 33.6 mg/dL — ABNORMAL LOW (ref >39.00)
NonHDL: 223.09
Total CHOL/HDL Ratio: 8
Triglycerides: 386 mg/dL — ABNORMAL HIGH (ref 0.0–149.0)
VLDL: 77.2 mg/dL — ABNORMAL HIGH (ref 0.0–40.0)

## 2021-01-03 LAB — LDL CHOLESTEROL, DIRECT: Direct LDL: 191 mg/dL

## 2021-01-03 LAB — T4, FREE: Free T4: 0.87 ng/dL (ref 0.60–1.60)

## 2021-01-03 LAB — VITAMIN B12: Vitamin B-12: 191 pg/mL — ABNORMAL LOW (ref 211–911)

## 2021-01-03 LAB — IRON: Iron: 62 ug/dL (ref 42–145)

## 2021-01-03 LAB — TSH: TSH: 6.85 u[IU]/mL — ABNORMAL HIGH (ref 0.35–4.50)

## 2021-01-03 LAB — HEMOGLOBIN A1C: Hgb A1c MFr Bld: 6.9 % — ABNORMAL HIGH (ref 4.6–6.5)

## 2021-01-03 MED ORDER — INSULIN DETEMIR 100 UNIT/ML FLEXPEN: SUBCUTANEOUS | 3 refills | Status: DC

## 2021-01-03 MED ORDER — ATORVASTATIN CALCIUM 10 MG PO TABS: 10.0000 mg | ORAL_TABLET | Freq: Every day | ORAL | 3 refills | Status: DC

## 2021-01-03 MED ORDER — VENLAFAXINE HCL ER 37.5 MG PO CP24: 37.5000 mg | ORAL_CAPSULE | Freq: Every day | ORAL | 0 refills | Status: DC

## 2021-01-03 MED ORDER — DICYCLOMINE HCL 10 MG PO CAPS: 10.0000 mg | ORAL_CAPSULE | Freq: Three times a day (TID) | ORAL | 0 refills | Status: DC

## 2021-01-03 NOTE — Patient Instructions (Addendum)
History of hypertension and on recheck today blood pressure is better than initial reading.  Continue amlodipine 10 mg daily, metoprolol XL 25 mg daily and losartan 20 mg daily.  History of diabetes and last A1c was six 6.7.  I refilled your Levemir today.  Continue 36 units at night and Metformin 1000 mg twice daily.  History of insomnia.  Continue nortriptyline.  History of depression and quite high pH Q9 score.  Prescribed low-dose Effexor 25 mg daily.  If any thoughts of harm to self or others then recommend [facility] long ED evaluation.  If not improving with low-dose Effexor then will refer you to psychiatry.  For fatigue placed TSH, T4, B12, B1, iron level and vitamin D.  For chronic loose stools for the past 3 to 4 months placed orders to do stool panel studies.  Tenderness and as soon as possible.  Then can start the Bentyl.  Went ahead and referred to GI for screening colonoscopy and to evaluate chronic loose stool/diarrhea.  History of hip pain.  Difficulty ambulating.  We will go ahead and fill out your disability placard.  X-rays were negative in the past when done through med center.  If you want me to refer to sports medicine let me know.  Follow-up in 1 month or as needed.

## 2021-01-03 NOTE — Progress Notes (Signed)
Subjective:    Patient ID: Jasmine Keith, female    DOB: 20-Aug-1951, 70 y.o.   MRN: [id number]  HPI  Pt bp is high initially. Pt does not check bp at home. She is on amlodipine 10 mg daily, metoprolol xl 25 mg daily and losartan 100 mg daily.  Pt has diabetes. Last a1c 3 months ago was 6.7. On levimir 36 units at night and metformin 1000 mg bid.   Hx of high cholesterol. On fenofribate.  Pt phq-9 score is 15. Pt is on pamelor 50 mg at night. Pt states in past use prozac. Worked for 2 years and then stopped working. Pt clarifies no plans for suicide or hurting other. But transient thoughts better of dead. Pt did psychiatrist in past around 70 years of age.     Review of Systems  Constitutional: Positive for fatigue.  HENT: Negative for congestion and ear discharge.   Eyes: Negative for pain.  Respiratory: Negative for cough, chest tightness, shortness of breath and wheezing.   Gastrointestinal: Positive for diarrhea.       For 3-4 months. Husband had and then he got better. Pt still has 3-4 loose stools a day.   Genitourinary: Negative for dysuria, flank pain and frequency.  Musculoskeletal: Negative for back pain.       Rt hip pain hurts when she walks. Hx of handicap placard.  Skin: Negative for rash.  Neurological: Negative for dizziness and headaches.  Hematological: Negative for adenopathy. Does not bruise/bleed easily.  Psychiatric/Behavioral: Negative for behavioral problems, decreased concentration and suicidal ideas. The patient is not nervous/anxious.     Past Medical History:  Diagnosis Date  . Anxiety   . Diabetes mellitus without complication ([place])   . Fibromyalgia   . GERD (gastroesophageal reflux disease)   . Hyperlipidemia   . Hypertension   . Osteoporosis      Social History   Socioeconomic History  . Marital status: Married    Spouse name: Not on file  . Number of children: Not on file  . Years of education: Not on file  . Highest  education level: Not on file  Occupational History  . Not on file  Tobacco Use  . Smoking status: Current Every Day Smoker    Packs/day: 0.50    Types: Cigarettes  . Smokeless tobacco: Never Used  Substance and Sexual Activity  . Alcohol use: Yes    Comment: Rare  . Drug use: Not on file  . Sexual activity: Not on file  Other Topics Concern  . Not on file  Social History Narrative  . Not on file   Social Determinants of Health   Financial Resource Strain: Not on file  Food Insecurity: Not on file  Transportation Needs: Not on file  Physical Activity: Not on file  Stress: Not on file  Social Connections: Not on file  Intimate Partner Violence: Not on file    No past surgical history on file.  Family History  Problem Relation Age of Onset  . Diabetes Mother   . Kidney disease Mother   . Diabetes Father   . Cancer Sister        lung  . Kidney disease Sister   . Thyroid disease Neg Hx     No Known Allergies  Current Outpatient Medications on File Prior to Visit  Medication Sig Dispense Refill  . alendronate (FOSAMAX) 70 MG tablet Take 1 tablet (70 mg total) by mouth once a week. Take with a  full glass of water on an empty stomach. 12 tablet 3  . amLODipine (NORVASC) 10 MG tablet Take 10 mg by mouth daily.    . busPIRone (BUSPAR) 5 MG tablet Take 5 mg by mouth daily.    . fenofibrate 160 MG tablet Take 160 mg by mouth daily.    Marland Kitchen gabapentin (NEURONTIN) 800 MG tablet Take 1 tablet (800 mg total) by mouth 2 (two) times daily. 60 tablet 2  . Insulin Detemir (LEVEMIR FLEXPEN [place]) Inject 36 Units into the skin daily.    . Insulin Pen Needle (BD PEN NEEDLE NANO U/F) 32G X 4 MM MISC Use one needle daily to inject insulin 30 each 5  . losartan (COZAAR) 100 MG tablet Take 1 tablet (100 mg total) by mouth daily. 90 tablet 3  . meclizine (ANTIVERT) 25 MG tablet Take 25 mg by mouth as needed for dizziness.    . meloxicam (MOBIC) 7.5 MG tablet Take 7.5 mg by mouth daily as needed.     . metFORMIN (GLUCOPHAGE) 1000 MG tablet Take 1 tablet (1,000 mg total) by mouth 2 (two) times daily with a meal. 180 tablet 3  . metoprolol succinate (TOPROL-XL) 25 MG 24 hr tablet TAKE 1 TABLET BY MOUTH EVERY DAY 90 tablet 1  . nortriptyline (PAMELOR) 50 MG capsule Take 1 capsule (50 mg total) by mouth at bedtime. 90 capsule 0  . pantoprazole (PROTONIX) 40 MG tablet Take 40 mg by mouth daily.    . busPIRone (BUSPAR) 7.5 MG tablet TAKE 1 TABLET BY MOUTH 2 TIMES DAILY. (Patient not taking: No sig reported) 180 tablet 1  . pravastatin (PRAVACHOL) 10 MG tablet Take 10 mg by mouth daily. (Patient not taking: No sig reported)     No current facility-administered medications on file prior to visit.    BP 132/60   Pulse 77   Temp 98.3 F (36.8 C) (Oral)   Resp 20   Ht 5\' 1"  (1.549 m)   Wt 114 lb 3.2 oz (51.8 kg)   SpO2 100%   BMI 21.58 kg/m       Objective:   Physical Exam   General Mental Status- Alert. General Appearance- Not in acute distress.   Skin General: Color- Normal Color. Moisture- Normal Moisture.  Neck Carotid Arteries- Normal color. Moisture- Normal Moisture. No carotid bruits. No JVD.  Chest and Lung Exam Auscultation: Breath Sounds:-Normal.  Cardiovascular Auscultation:Rythm- Regular. Murmurs & Other Heart Sounds:Auscultation of the heart reveals- No Murmurs.  Abdomen Inspection:-Inspeection Normal. Palpation/Percussion:Note:No mass. Palpation and Percussion of the abdomen reveal- Non Tender, Non Distended + BS, no rebound or guarding.    Neurologic Cranial Nerve exam:- CN III-XII intact(No nystagmus), symmetric smile. Strength:- 5/5 equal and symmetric strength both upper and lower extremities.     Assessment & Plan:  History of hypertension and on recheck today blood pressure is better than initial reading.  Continue amlodipine 10 mg daily, metoprolol XL 25 mg daily and losartan 20 mg daily.  History of diabetes and last A1c was six 6.7.  I  refilled your Levemir today.  Continue 36 units at night and Metformin 1000 mg twice daily.  History of insomnia.  Continue nortriptyline.  History of depression and quite high pH Q9 score.  Prescribed low-dose Effexor 25 mg daily.  If any thoughts of harm to self or others then recommend [facility] long ED evaluation.  If not improving with low-dose Effexor then will refer you to psychiatry.  For fatigue placed TSH, T4, B12, B1,  iron level and vitamin D.  For chronic loose stools for the past 3 to 4 months placed orders to do stool panel studies.  Tenderness and as soon as possible.  Then can start the Bentyl.  Went ahead and referred to GI for screening colonoscopy and to evaluate chronic loose stool/diarrhea.  History of hip pain.  Difficulty ambulating.  We will go ahead and fill out your disability placard.  X-rays were negative in the past when done through med center.  If you want me to refer to sports medicine let me know.  Follow-up in 1 month or as needed.  Mackie Pai, PA-C   Time spent with patient today was 40+   minutes which consisted of chart review, discussing diagnoses, work up, treatment and documentation.

## 2021-01-03 NOTE — Telephone Encounter (Signed)
Rx atorvastatin.

## 2021-01-06 ENCOUNTER — Encounter: Payer: Self-pay | Admitting: Gastroenterology

## 2021-01-06 ENCOUNTER — Telehealth: Payer: Self-pay | Admitting: Medical

## 2021-01-06 LAB — VITAMIN D 1,25 DIHYDROXY
Vitamin D 1, 25 (OH)2 Total: 18 pg/mL (ref 18–72)
Vitamin D2 1, 25 (OH)2: <8 pg/mL
Vitamin D3 1, 25 (OH)2: 18 pg/mL

## 2021-01-06 MED ORDER — VITAMIN D (ERGOCALCIFEROL) 1.25 MG (50000 UNIT) PO CAPS: 50000.0000 [IU] | ORAL_CAPSULE | ORAL | 0 refills | Status: DC

## 2021-01-06 NOTE — Telephone Encounter (Signed)
Rx vit D sent to pt pharmacy. 

## 2021-01-12 NOTE — Addendum Note (Signed)
Addended by: Elayne Snare on: 01/12/2021 08:29 AM   Modules accepted: Orders

## 2021-01-14 ENCOUNTER — Telehealth: Payer: Self-pay

## 2021-01-14 DIAGNOSIS — R5383 Other fatigue: Secondary | ICD-10-CM

## 2021-01-14 MED ORDER — LEVOTHYROXINE SODIUM 25 MCG PO TABS: 25.0000 ug | ORAL_TABLET | Freq: Every day | ORAL | 1 refills | Status: DC

## 2021-01-14 NOTE — Telephone Encounter (Signed)
Called and spoke with pt to give lab results. Pt was advised to start vitamin D3 1000 units daily. Pt was recently prescribed vitamin D 1.25 MG from PCP that pt has started. Also advised to start levothyroxine 25 mcg daily. Rx sent to pharmacy. Lab appt scheduled for 2 months.

## 2021-01-14 NOTE — Telephone Encounter (Signed)
-----   Message from Elayne Snare, MD sent at 01/12/2021  8:29 AM EST ----- Call regarding labs: Vitamin D level slightly low, start vitamin D3 1000 units daily Also TSH mildly increased, can start levothyroxine 25 mcg daily as a thyroid supplement and follow-up in 2 months with labs

## 2021-01-26 ENCOUNTER — Other Ambulatory Visit: Payer: Self-pay | Admitting: Medical

## 2021-01-31 ENCOUNTER — Other Ambulatory Visit: Payer: Self-pay

## 2021-01-31 ENCOUNTER — Encounter: Payer: Self-pay | Admitting: Medical

## 2021-01-31 ENCOUNTER — Ambulatory Visit (INDEPENDENT_AMBULATORY_CARE_PROVIDER_SITE_OTHER): Payer: Medicare Other | Admitting: Medical

## 2021-01-31 VITALS — BP 130/60 | HR 78 | Temp 98.2°F | Resp 16 | Ht 61.0 in | Wt 116.0 lb

## 2021-01-31 DIAGNOSIS — E785 Hyperlipidemia, unspecified: Secondary | ICD-10-CM | POA: Diagnosis not present

## 2021-01-31 DIAGNOSIS — E119 Type 2 diabetes mellitus without complications: Secondary | ICD-10-CM | POA: Diagnosis not present

## 2021-01-31 DIAGNOSIS — I10 Essential (primary) hypertension: Secondary | ICD-10-CM

## 2021-01-31 DIAGNOSIS — M25559 Pain in unspecified hip: Secondary | ICD-10-CM

## 2021-01-31 DIAGNOSIS — F32A Depression, unspecified: Secondary | ICD-10-CM

## 2021-01-31 DIAGNOSIS — R7989 Other specified abnormal findings of blood chemistry: Secondary | ICD-10-CM

## 2021-01-31 DIAGNOSIS — R197 Diarrhea, unspecified: Secondary | ICD-10-CM

## 2021-01-31 DIAGNOSIS — E538 Deficiency of other specified B group vitamins: Secondary | ICD-10-CM | POA: Diagnosis not present

## 2021-01-31 NOTE — Progress Notes (Signed)
Subjective:    Patient ID: Jasmine Keith, female    DOB: 1951-07-04, 70 y.o.   MRN: [id number]  HPI  Pt in for follow up on bp. Pt does not check her blood pressure at home. Pt is on amlodipine 10 mg daily, metoprolol xl 25 mg daily and losartan 100 mg daily.  Last a1c was 6.9. On levemir 36 units at night and metformin 1000 mg twice daily.  Pt has high cholesterol and she is now on atorvastatin.   Reviewed 10 year cardiovascular risk score.   Pt has hx of depression. Pt mood feels some better. Started on low dose effexor 37.5 mg.    Hx of rt hip pain for years. Hurts for her to walk. In past used a cane. She wants disability placard. On review states can't walk 200 feet without having to rest.    Review of Systems  Constitutional: Negative for chills, fatigue and fever.  HENT: Negative for dental problem, ear discharge and ear pain.   Eyes: Negative for photophobia and pain.  Respiratory: Negative for cough, chest tightness, shortness of breath and wheezing.   Cardiovascular: Negative for chest pain and palpitations.  Gastrointestinal: Negative for abdominal pain.  Genitourinary: Negative for dysuria and flank pain.  Musculoskeletal: Negative for back pain.  Skin: Negative for rash.  Neurological: Negative for dizziness, seizures, syncope, weakness, numbness and headaches.  Hematological: Negative for adenopathy. Does not bruise/bleed easily.  Psychiatric/Behavioral: Positive for dysphoric mood. Negative for behavioral problems, confusion, self-injury, sleep disturbance and suicidal ideas. The patient is nervous/anxious.     Past Medical History:  Diagnosis Date  . Anxiety   . Diabetes mellitus without complication ([place])   . Fibromyalgia   . GERD (gastroesophageal reflux disease)   . Hyperlipidemia   . Hypertension   . Osteoporosis      Social History   Socioeconomic History  . Marital status: Married    Spouse name: Not on file  . Number of children: Not  on file  . Years of education: Not on file  . Highest education level: Not on file  Occupational History  . Not on file  Tobacco Use  . Smoking status: Current Every Day Smoker    Packs/day: 0.50    Types: Cigarettes  . Smokeless tobacco: Never Used  Substance and Sexual Activity  . Alcohol use: Yes    Comment: Rare  . Drug use: Not on file  . Sexual activity: Not on file  Other Topics Concern  . Not on file  Social History Narrative  . Not on file   Social Determinants of Health   Financial Resource Strain: Not on file  Food Insecurity: Not on file  Transportation Needs: Not on file  Physical Activity: Not on file  Stress: Not on file  Social Connections: Not on file  Intimate Partner Violence: Not on file    No past surgical history on file.  Family History  Problem Relation Age of Onset  . Diabetes Mother   . Kidney disease Mother   . Diabetes Father   . Cancer Sister        lung  . Kidney disease Sister   . Thyroid disease Neg Hx     No Known Allergies  Current Outpatient Medications on File Prior to Visit  Medication Sig Dispense Refill  . alendronate (FOSAMAX) 70 MG tablet Take 1 tablet (70 mg total) by mouth once a week. Take with a full glass of water on an empty  stomach. 12 tablet 3  . amLODipine (NORVASC) 10 MG tablet Take 10 mg by mouth daily.    Marland Kitchen atorvastatin (LIPITOR) 10 MG tablet Take 1 tablet (10 mg total) by mouth daily. 30 tablet 3  . busPIRone (BUSPAR) 5 MG tablet Take 5 mg by mouth daily.    . busPIRone (BUSPAR) 7.5 MG tablet TAKE 1 TABLET BY MOUTH 2 TIMES DAILY. (Patient not taking: No sig reported) 180 tablet 1  . dicyclomine (BENTYL) 10 MG capsule Take 1 capsule (10 mg total) by mouth 3 (three) times daily before meals. 90 capsule 0  . fenofibrate 160 MG tablet Take 160 mg by mouth daily.    Marland Kitchen gabapentin (NEURONTIN) 800 MG tablet Take 1 tablet (800 mg total) by mouth 2 (two) times daily. 60 tablet 2  . Insulin Detemir (LEVEMIR FLEXPEN [place])  Inject 36 Units into the skin daily.    . insulin detemir (LEVEMIR) 100 unit/ml SOLN 36 units at night. 15 mL 3  . Insulin Pen Needle (BD PEN NEEDLE NANO U/F) 32G X 4 MM MISC Use one needle daily to inject insulin 30 each 5  . levothyroxine (SYNTHROID) 25 MCG tablet Take 1 tablet (25 mcg total) by mouth daily before breakfast. 90 tablet 1  . losartan (COZAAR) 100 MG tablet Take 1 tablet (100 mg total) by mouth daily. 90 tablet 3  . meclizine (ANTIVERT) 25 MG tablet Take 25 mg by mouth as needed for dizziness.    . meloxicam (MOBIC) 7.5 MG tablet Take 7.5 mg by mouth daily as needed.    . metFORMIN (GLUCOPHAGE) 1000 MG tablet Take 1 tablet (1,000 mg total) by mouth 2 (two) times daily with a meal. 180 tablet 3  . metoprolol succinate (TOPROL-XL) 25 MG 24 hr tablet TAKE 1 TABLET BY MOUTH EVERY DAY 90 tablet 1  . nortriptyline (PAMELOR) 50 MG capsule Take 1 capsule (50 mg total) by mouth at bedtime. 90 capsule 0  . pantoprazole (PROTONIX) 40 MG tablet Take 40 mg by mouth daily.    Marland Kitchen venlafaxine XR (EFFEXOR-XR) 37.5 MG 24 hr capsule TAKE 1 CAPSULE BY MOUTH DAILY WITH BREAKFAST. 90 capsule 1  . Vitamin D, Ergocalciferol, (DRISDOL) 1.25 MG (50000 UNIT) CAPS capsule Take 1 capsule (50,000 Units total) by mouth every 7 (seven) days. 8 capsule 0   No current facility-administered medications on file prior to visit.    BP (!) 161/61 (BP Location: Left Arm, Patient Position: Sitting, Cuff Size: Small)   Pulse 78   Temp 98.2 F (36.8 C) (Oral)   Resp 16   Ht 5\' 1"  (1.549 m)   Wt 116 lb (52.6 kg)   SpO2 100%   BMI 21.92 kg/m       Objective:   Physical Exam   General Mental Status- Alert. General Appearance- Not in acute distress.   Skin General: Color- Normal Color. Moisture- Normal Moisture.  Neck Carotid Arteries- Normal color. Moisture- Normal Moisture. No carotid bruits. No JVD.  Chest and Lung Exam Auscultation: Breath Sounds:-Normal.  Cardiovascular Auscultation:Rythm-  Regular. Murmurs & Other Heart Sounds:Auscultation of the heart reveals- No Murmurs.  Abdomen Inspection:-Inspeection Normal. Palpation/Percussion:Note:No mass. Palpation and Percussion of the abdomen reveal- Non Tender, Non Distended + BS, no rebound or guarding.    Neurologic Cranial Nerve exam:- CN III-XII intact(No nystagmus), symmetric smile. Drift Test:- No drift. Romberg Exam:- Negative.  Heal to Toe Gait exam:-Normal. Finger to Nose:- Normal/Intact Strength:- 5/5 equal and symmetric strength both upper and lower extremities.  Assessment & Plan:  Bp is well controlled. Continue amlodipine 10 mg daily, metoprolol xl 25 mg daily and losartan 100 mg daily.  Diabetes well controlled/less than 7.0 a1c. Continue levmir 36 units at night and metfomrin 1000 mg twice daily.  For high cholesterol continue atorvastatin.  Depression/mood better. Continue with effexor 37.5 mg daily/  For rt hip pain chronic will sign disability placard. But also want to go ahead and refer you to sport med MD.  For low b-12 get level today.  Follow up 2 months or as needed.

## 2021-01-31 NOTE — Patient Instructions (Addendum)
Bp is well controlled. Continue amlodipine 10 mg daily, metoprolol xl 25 mg daily and losartan 100 mg daily.  Diabetes well controlled/less than 7.0 a1c. Continue levmir 36 units at night and metfomrin 1000 mg twice daily.  For high cholesterol continue atorvastatin.  Depression/mood better. Continue with effexor 37.5 mg daily/  For rt hip pain chronic will sign disability placard. But also want to go ahead and refer you to sport med MD.  For low b-12 get level today.  Follow up 2 months or as needed.

## 2021-01-31 NOTE — Addendum Note (Signed)
Addended by: Manuela Schwartz on: 01/31/2021 02:25 PM   Modules accepted: Orders

## 2021-02-01 LAB — VITAMIN B12: Vitamin B-12: 195 pg/mL — ABNORMAL LOW (ref 211–911)

## 2021-02-02 LAB — CLOSTRIDIUM DIFFICILE BY PCR: Toxigenic C. Difficile by PCR: NEGATIVE

## 2021-02-04 LAB — OVA AND PARASITE EXAMINATION
CONCENTRATE RESULT:: NONE SEEN
MICRO NUMBER:: 11699635
SPECIMEN QUALITY:: ADEQUATE
TRICHROME RESULT:: NONE SEEN

## 2021-02-05 LAB — STOOL CULTURE: E coli, Shiga toxin Assay: NEGATIVE

## 2021-02-10 ENCOUNTER — Ambulatory Visit (INDEPENDENT_AMBULATORY_CARE_PROVIDER_SITE_OTHER): Payer: Medicare Other

## 2021-02-10 ENCOUNTER — Other Ambulatory Visit: Payer: Self-pay

## 2021-02-10 DIAGNOSIS — E538 Deficiency of other specified B group vitamins: Secondary | ICD-10-CM | POA: Diagnosis not present

## 2021-02-10 MED ORDER — CYANOCOBALAMIN 1000 MCG/ML IJ SOLN
1000.0000 ug | Freq: Once | INTRAMUSCULAR | Status: AC
  Administered 2021-02-10: 1000 ug via INTRAMUSCULAR

## 2021-02-10 NOTE — Progress Notes (Addendum)
Pt here for monthly B12 injection per Mackie Pai, PA-C  B12 1020mcg given IM on right deltoid and pt tolerated injection well.  Next B12 injection scheduled for 02/17/2021. -Jma   Mackie Pai, PA-C

## 2021-02-11 ENCOUNTER — Encounter: Payer: Self-pay | Admitting: Gastroenterology

## 2021-02-11 ENCOUNTER — Ambulatory Visit (INDEPENDENT_AMBULATORY_CARE_PROVIDER_SITE_OTHER): Payer: Medicare Other | Admitting: Gastroenterology

## 2021-02-11 VITALS — BP 132/82 | Ht 61.0 in | Wt 114.4 lb

## 2021-02-11 DIAGNOSIS — R197 Diarrhea, unspecified: Secondary | ICD-10-CM

## 2021-02-11 DIAGNOSIS — R110 Nausea: Secondary | ICD-10-CM

## 2021-02-11 DIAGNOSIS — R634 Abnormal weight loss: Secondary | ICD-10-CM | POA: Diagnosis not present

## 2021-02-11 NOTE — Patient Instructions (Signed)
If you are age 69 or older, your body mass index should be between 23-30. Your Body mass index is 21.61 kg/m. If this is out of the aforementioned range listed, please consider follow up with your Primary Care Provider.  If you are age 47 or younger, your body mass index should be between 19-25. Your Body mass index is 21.61 kg/m. If this is out of the aformentioned range listed, please consider follow up with your Primary Care Provider.   You have been scheduled for an endoscopy and colonoscopy. Please follow the written instructions given to you at your visit today. Please pick up your prep supplies at the pharmacy within the next 1-3 days. If you use inhalers (even only as needed), please bring them with you on the day of your procedure.  Continue with Bentyl 3 times a day.   Thank you,  Dr. Jackquline Denmark

## 2021-02-11 NOTE — Progress Notes (Signed)
Chief Complaint: GI problems  Referring Provider:  Mackie Pai, PA-C      ASSESSMENT AND PLAN;   #1. Diarrhea. D/d includes irritable bowel syndrome with predominant diarrhea, diarrhea due to medications like Metformin, microscopic colitis, IBD, malabsorption, celiac disease and hypothyroidism. Neg stool studies for C Diff, culture.  #2. Nausea with postprandial abdominal bloating.  #3. Wt loss  Plan: -Bentyl 10mg  TID (she has it) -EGD/Colon with miralax prep.  I discussed risks and benefits. -Check Wt every week and record. If continued wt loss, will proceed with CT abdo/pelvis with PO/IV contrast followed by solid-phase GES, if needed.  -Discussed need to eat 3 meals a day -Also asked her to quit smoking.   D/w pt and pt's husband in detail. All the contact numbers were given. Patient knows how to get in touch with [place].  Discussed risks & benefits. Risks including rare perforation req laparotomy, bleeding after bx/polypectomy req blood transfusion, rarely missing neoplasms, risks of anesthesia/sedation. Benefits outweigh the risks. Patient agrees to proceed. All the questions were answered. Consent forms given for review.     HPI:    Jasmine Keith is a 70 y.o. female  Accompanied by her husband With IDDM, anxiety/depression, fibromyalgia, HLD, HTN, GERD  C/O Diarrhea 2-4/day x 3-4 months, getting worse.  She will occasionally have nocturnal symptoms but not usually.  Had negative stool studies including stool for C. difficile, WBCs, ova parasites.  No melena or hematochezia.  She would have bowel movements after she eats.  She has been given a prescription for Bentyl which she has not tried.  She continues to be on Metformin.   C/O postprandial upper abdominal bloating with nausea but no vomiting.  Does not have good appetite.  Has unintentional weight loss. Wt loss 129 to 114 over 3 months.  She has turned into a vegetarian.  Was found to have B12 deficiency  and is currently on supplements.  No fever chills or night sweats.  SH-moved from [place] to [place] and then to [place] 3 months ago.   Past Medical History:  Diagnosis Date  . Anxiety   . Diabetes mellitus without complication ([place])   . Fibromyalgia   . GERD (gastroesophageal reflux disease)   . Hyperlipidemia   . Hypertension   . Osteoporosis     History reviewed. No pertinent surgical history.  Family History  Problem Relation Age of Onset  . Diabetes Mother   . Kidney disease Mother   . Diabetes Father   . Cancer Sister        lung  . Kidney disease Sister   . Thyroid disease Neg Hx     Social History   Tobacco Use  . Smoking status: Current Every Day Smoker    Packs/day: 0.50    Types: Cigarettes  . Smokeless tobacco: Never Used  Substance Use Topics  . Alcohol use: Yes    Comment: Rare  . Drug use: Not Currently    Current Outpatient Medications  Medication Sig Dispense Refill  . alendronate (FOSAMAX) 70 MG tablet Take 1 tablet (70 mg total) by mouth once a week. Take with a full glass of water on an empty stomach. 12 tablet 3  . amLODipine (NORVASC) 10 MG tablet Take 10 mg by mouth daily.    . busPIRone (BUSPAR) 7.5 MG tablet TAKE 1 TABLET BY MOUTH 2 TIMES DAILY. 180 tablet 1  . fenofibrate 160 MG tablet Take 160 mg by mouth daily.    Marland Kitchen  gabapentin (NEURONTIN) 800 MG tablet Take 1 tablet (800 mg total) by mouth 2 (two) times daily. 60 tablet 2  . Insulin Detemir (LEVEMIR FLEXPEN [place]) Inject 36 Units into the skin daily.    . insulin detemir (LEVEMIR) 100 unit/ml SOLN 36 units at night. 15 mL 3  . Insulin Pen Needle (BD PEN NEEDLE NANO U/F) 32G X 4 MM MISC Use one needle daily to inject insulin 30 each 5  . levothyroxine (SYNTHROID) 25 MCG tablet Take 1 tablet (25 mcg total) by mouth daily before breakfast. 90 tablet 1  . losartan (COZAAR) 100 MG tablet Take 1 tablet (100 mg total) by mouth daily. 90 tablet 3  . meclizine (ANTIVERT) 25 MG tablet Take 25  mg by mouth as needed for dizziness.    . meloxicam (MOBIC) 7.5 MG tablet Take 7.5 mg by mouth daily as needed.    . metFORMIN (GLUCOPHAGE) 1000 MG tablet Take 1 tablet (1,000 mg total) by mouth 2 (two) times daily with a meal. 180 tablet 3  . metoprolol succinate (TOPROL-XL) 25 MG 24 hr tablet TAKE 1 TABLET BY MOUTH EVERY DAY 90 tablet 1  . nortriptyline (PAMELOR) 50 MG capsule Take 1 capsule (50 mg total) by mouth at bedtime. 90 capsule 0  . pantoprazole (PROTONIX) 40 MG tablet Take 40 mg by mouth daily.    Marland Kitchen venlafaxine XR (EFFEXOR-XR) 37.5 MG 24 hr capsule TAKE 1 CAPSULE BY MOUTH DAILY WITH BREAKFAST. 90 capsule 1  . Vitamin D, Ergocalciferol, (DRISDOL) 1.25 MG (50000 UNIT) CAPS capsule Take 1 capsule (50,000 Units total) by mouth every 7 (seven) days. 8 capsule 0  . busPIRone (BUSPAR) 5 MG tablet Take 5 mg by mouth daily.    Marland Kitchen dicyclomine (BENTYL) 10 MG capsule Take 1 capsule (10 mg total) by mouth 3 (three) times daily before meals. (Patient not taking: Reported on 02/11/2021) 90 capsule 0   No current facility-administered medications for this visit.    No Known Allergies  Review of Systems:  Constitutional: Denies fever, chills, diaphoresis, appetite change and has fatigue.  HEENT: Denies photophobia, eye pain, redness, hearing loss, ear pain, congestion, sore throat, rhinorrhea, sneezing, mouth sores, neck pain, neck stiffness and tinnitus.   Respiratory: Denies SOB, DOE, cough, chest tightness,  and wheezing.   Cardiovascular: Denies chest pain, palpitations and leg swelling.  Genitourinary: Denies dysuria, urgency, frequency, hematuria, flank pain and difficulty urinating.  Musculoskeletal: Denies myalgias, back pain, joint swelling, arthralgias and gait problem.  Skin: No rash.  Neurological: Denies dizziness, seizures, syncope, weakness, light-headedness, numbness and headaches.  Hematological: Denies adenopathy. Easy bruising, personal or family bleeding history   Psychiatric/Behavioral: Has anxiety or depression     Physical Exam:    BP 132/82 (BP Location: Right Arm, Patient Position: Sitting, Cuff Size: Normal)   Ht 5\' 1"  (1.549 m)   Wt 114 lb 6 oz (51.9 kg)   BMI 21.61 kg/m  Wt Readings from Last 3 Encounters:  02/11/21 114 lb 6 oz (51.9 kg)  01/31/21 116 lb (52.6 kg)  01/03/21 114 lb 3.2 oz (51.8 kg)   Constitutional:  Well-developed, in no acute distress. Psychiatric: Normal mood and affect. Behavior is normal. HEENT: Pupils normal.  Conjunctivae are normal. No scleral icterus. Neck supple.  Cardiovascular: Normal rate, regular rhythm. No edema Pulmonary/chest: Effort normal and breath sounds normal. No wheezing, rales or rhonchi. Abdominal: Soft, nondistended. Nontender. Bowel sounds active throughout. There are no masses palpable. No hepatomegaly. Rectal: Deferred Neurological: Alert and oriented to  person place and time. Skin: Skin is warm and dry. No rashes noted.  Data Reviewed: I have personally reviewed following labs and imaging studies  CBC: CBC Latest Ref Rng & Units 01/03/2021  WBC 4.0 - 10.5 K/uL 7.0  Hemoglobin 12.0 - 15.0 g/dL 11.6(L)  Hematocrit 36.0 - 46.0 % 34.8(L)  Platelets 150.0 - 400.0 K/uL 390.0    CMP: CMP Latest Ref Rng & Units 01/03/2021 12/02/2020 09/22/2020  Glucose 70 - 99 mg/dL 144(H) 161(H) 166(H)  BUN 6 - 23 mg/dL 16 16 20   Creatinine 0.40 - 1.20 mg/dL 0.83 0.92 1.03  Sodium 135 - 145 mEq/L 137 139 137  Potassium 3.5 - 5.1 mEq/L 4.5 3.9 4.8  Chloride 96 - 112 mEq/L 103 106 103  CO2 19 - 32 mEq/L 24 26 24   Calcium 8.4 - 10.5 mg/dL 10.5 10.0 10.8(H)  Total Protein 6.0 - 8.3 g/dL 7.0 - 7.2  Total Bilirubin 0.2 - 1.2 mg/dL 0.4 - 0.4  Alkaline Phos 39 - 117 U/L 44 - 43  AST 0 - 37 U/L 16 - 20  ALT 0 - 35 U/L 12 - 16      Carmell Austria, MD 02/11/2021, 10:39 AM  Cc: Mackie Pai, PA-C

## 2021-02-17 ENCOUNTER — Ambulatory Visit (INDEPENDENT_AMBULATORY_CARE_PROVIDER_SITE_OTHER): Payer: Medicare Other

## 2021-02-17 ENCOUNTER — Other Ambulatory Visit: Payer: Self-pay

## 2021-02-17 DIAGNOSIS — E538 Deficiency of other specified B group vitamins: Secondary | ICD-10-CM

## 2021-02-17 MED ORDER — CYANOCOBALAMIN 1000 MCG/ML IJ SOLN
1000.0000 ug | Freq: Once | INTRAMUSCULAR | Status: AC
  Administered 2021-02-17: 1000 ug via INTRAMUSCULAR

## 2021-02-17 NOTE — Progress Notes (Addendum)
Jasmine Keith is a 70 y.o. female presents to the office today for B12 injection #2 of 4 weekly, per physician's orders. Original order: on lab results from 01-31-21. Cyanocobalamin (med),1000 mcg/ml (dose),  IM (route) was administered on right deltoid (location) today. Patient tolerated injection.  Patient next injection due: in one week appt made for 02-24-21.   Jolene Provost [name], PA-C

## 2021-02-24 ENCOUNTER — Other Ambulatory Visit: Payer: Self-pay

## 2021-02-24 ENCOUNTER — Ambulatory Visit (INDEPENDENT_AMBULATORY_CARE_PROVIDER_SITE_OTHER): Payer: Medicare Other

## 2021-02-24 DIAGNOSIS — E538 Deficiency of other specified B group vitamins: Secondary | ICD-10-CM | POA: Diagnosis not present

## 2021-02-24 MED ORDER — CYANOCOBALAMIN 1000 MCG/ML IJ SOLN
1000.0000 ug | Freq: Once | INTRAMUSCULAR | Status: AC
  Administered 2021-02-24: 1000 ug via INTRAMUSCULAR

## 2021-02-24 NOTE — Progress Notes (Addendum)
Jasmine Keith is a 70 y.o. female presents to the office today for B12 injections #3 of 4 weekly, per physician's orders. Original order: on lab results from 01-31-21. Cyanocobalamin (med), 1000 mcg/ml (dose),  IM (route) was administered on left deltoid (location) today. Patient tolerated injection.  Patient next injection due: in one week, appt made for Teresa Pelton, PA-C

## 2021-02-28 ENCOUNTER — Other Ambulatory Visit: Payer: Self-pay | Admitting: Medical

## 2021-03-02 ENCOUNTER — Ambulatory Visit (HOSPITAL_BASED_OUTPATIENT_CLINIC_OR_DEPARTMENT_OTHER)
Admission: RE | Admit: 2021-03-02 | Discharge: 2021-03-02 | Disposition: A | Discharge status: Home / Self Care | Payer: Medicare Other | Source: Ambulatory Visit | Attending: Medical | Admitting: Medical

## 2021-03-02 ENCOUNTER — Ambulatory Visit (INDEPENDENT_AMBULATORY_CARE_PROVIDER_SITE_OTHER): Payer: Medicare Other | Admitting: Medical

## 2021-03-02 ENCOUNTER — Other Ambulatory Visit: Payer: Self-pay

## 2021-03-02 VITALS — BP 182/63 | HR 77 | Temp 98.0°F | Resp 20 | Ht 61.0 in | Wt 113.0 lb

## 2021-03-02 DIAGNOSIS — R519 Headache, unspecified: Secondary | ICD-10-CM

## 2021-03-02 DIAGNOSIS — W57XXXA Bitten or stung by nonvenomous insect and other nonvenomous arthropods, initial encounter: Secondary | ICD-10-CM

## 2021-03-02 DIAGNOSIS — E538 Deficiency of other specified B group vitamins: Secondary | ICD-10-CM

## 2021-03-02 DIAGNOSIS — R42 Dizziness and giddiness: Secondary | ICD-10-CM | POA: Diagnosis present

## 2021-03-02 DIAGNOSIS — S30861A Insect bite (nonvenomous) of abdominal wall, initial encounter: Secondary | ICD-10-CM

## 2021-03-02 DIAGNOSIS — I10 Essential (primary) hypertension: Secondary | ICD-10-CM | POA: Diagnosis not present

## 2021-03-02 DIAGNOSIS — R197 Diarrhea, unspecified: Secondary | ICD-10-CM

## 2021-03-02 MED ORDER — DOXYCYCLINE HYCLATE 100 MG PO TABS: 100.0000 mg | ORAL_TABLET | Freq: Two times a day (BID) | ORAL | 0 refills | Status: DC

## 2021-03-02 MED ORDER — MECLIZINE HCL 25 MG PO TABS: 25.0000 mg | ORAL_TABLET | Freq: Three times a day (TID) | ORAL | 0 refills | Status: DC | PRN

## 2021-03-02 NOTE — Progress Notes (Signed)
Subjective:    Patient ID: Jasmine Keith, female    DOB: Feb 15, 1951, 70 y.o.   MRN: [id number]  HPI  Pt in for 2 weeks of having some headaches and dizziness. Pt states meclizine will help with dizziness. Meclizine make dizziness tolerable.  Pt states headaches are occurring almost daily. Will last for few hours up to all day. Last 2 days less headache. No loss of vision, no slurred speech, no gross motor/sensory function deficits or aphasia.  Over past 2 weeks she had high level bp reading 202/80 about one week ago. Sysolic has been [phone] as she checked.  Pt is still having daily chronic loose stools. Stools studies at end of march were negative.  GI MD plan. 1. Diarrhea. D/d includes irritable bowel syndrome with predominant diarrhea, diarrhea due to medications like Metformin, microscopic colitis, IBD, malabsorption, celiac disease and hypothyroidism. Neg stool studies for C Diff, culture.  #2. Nausea with postprandial abdominal bloating.  #3. Wt loss  Plan: -Bentyl 10mg  TID (she has it) -EGD/Colon with miralax prep.  I discussed risks and benefits. -Check Wt every week and record. If continued wt loss, will proceed with CT abdo/pelvis with PO/IV contrast followed by solid-phase GES, if needed.  -Discussed need to eat 3 meals a day -Also asked her to quit smoking.   Pt states also got bit by tick on rt side abdomen. Pt pulled it off 5 days ago. The area is little red and sore.  Review of Systems  Constitutional: Positive for fatigue.  Respiratory: Negative for cough, choking, shortness of breath and wheezing.   Cardiovascular: Negative for chest pain and palpitations.  Gastrointestinal: Negative for abdominal pain and blood in stool.  Genitourinary: Negative for dysuria.  Musculoskeletal: Negative for back pain and myalgias.  Skin: Negative for rash.  Neurological: Positive for headaches. Negative for dizziness, speech difficulty, weakness and numbness.   Psychiatric/Behavioral: Negative for behavioral problems, confusion and suicidal ideas. The patient is not nervous/anxious.     Past Medical History:  Diagnosis Date  . Anxiety   . Diabetes mellitus without complication ([place])   . Fibromyalgia   . GERD (gastroesophageal reflux disease)   . Hyperlipidemia   . Hypertension   . Osteoporosis      Social History   Socioeconomic History  . Marital status: Married    Spouse name: Not on file  . Number of children: Not on file  . Years of education: Not on file  . Highest education level: Not on file  Occupational History  . Not on file  Tobacco Use  . Smoking status: Current Every Day Smoker    Packs/day: 0.50    Types: Cigarettes  . Smokeless tobacco: Never Used  Substance and Sexual Activity  . Alcohol use: Yes    Comment: Rare  . Drug use: Not Currently  . Sexual activity: Not on file  Other Topics Concern  . Not on file  Social History Narrative  . Not on file   Social Determinants of Health   Financial Resource Strain: Not on file  Food Insecurity: Not on file  Transportation Needs: Not on file  Physical Activity: Not on file  Stress: Not on file  Social Connections: Not on file  Intimate Partner Violence: Not on file    No past surgical history on file.  Family History  Problem Relation Age of Onset  . Diabetes Mother   . Kidney disease Mother   . Diabetes Father   . Cancer Sister  lung  . Kidney disease Sister   . Thyroid disease Neg Hx     No Known Allergies  Current Outpatient Medications on File Prior to Visit  Medication Sig Dispense Refill  . alendronate (FOSAMAX) 70 MG tablet Take 1 tablet (70 mg total) by mouth once a week. Take with a full glass of water on an empty stomach. 12 tablet 3  . amLODipine (NORVASC) 10 MG tablet Take 10 mg by mouth daily.    Marland Kitchen atorvastatin (LIPITOR) 10 MG tablet TAKE 1 TABLET BY MOUTH EVERY DAY 90 tablet 1  . busPIRone (BUSPAR) 5 MG tablet Take 5 mg by  mouth daily.    . busPIRone (BUSPAR) 7.5 MG tablet TAKE 1 TABLET BY MOUTH TWICE A DAY 180 tablet 1  . dicyclomine (BENTYL) 10 MG capsule TAKE 1 CAPSULE (10 MG TOTAL) BY MOUTH 3 (THREE) TIMES DAILY BEFORE MEALS. 90 capsule 0  . fenofibrate 160 MG tablet Take 160 mg by mouth daily.    Marland Kitchen gabapentin (NEURONTIN) 800 MG tablet Take 1 tablet (800 mg total) by mouth 2 (two) times daily. 60 tablet 2  . Insulin Detemir (LEVEMIR FLEXPEN [place]) Inject 36 Units into the skin daily.    . insulin detemir (LEVEMIR) 100 unit/ml SOLN 36 units at night. 15 mL 3  . Insulin Pen Needle (BD PEN NEEDLE NANO U/F) 32G X 4 MM MISC Use one needle daily to inject insulin 30 each 5  . levothyroxine (SYNTHROID) 25 MCG tablet Take 1 tablet (25 mcg total) by mouth daily before breakfast. 90 tablet 1  . losartan (COZAAR) 100 MG tablet Take 1 tablet (100 mg total) by mouth daily. 90 tablet 3  . meclizine (ANTIVERT) 25 MG tablet Take 25 mg by mouth as needed for dizziness.    . meloxicam (MOBIC) 7.5 MG tablet Take 7.5 mg by mouth daily as needed.    . metFORMIN (GLUCOPHAGE) 1000 MG tablet Take 1 tablet (1,000 mg total) by mouth 2 (two) times daily with a meal. 180 tablet 3  . metoprolol succinate (TOPROL-XL) 25 MG 24 hr tablet TAKE 1 TABLET BY MOUTH EVERY DAY 90 tablet 1  . nortriptyline (PAMELOR) 50 MG capsule Take 1 capsule (50 mg total) by mouth at bedtime. 90 capsule 0  . pantoprazole (PROTONIX) 40 MG tablet Take 40 mg by mouth daily.    Marland Kitchen venlafaxine XR (EFFEXOR-XR) 37.5 MG 24 hr capsule TAKE 1 CAPSULE BY MOUTH DAILY WITH BREAKFAST. 90 capsule 1  . Vitamin D, Ergocalciferol, (DRISDOL) 1.25 MG (50000 UNIT) CAPS capsule TAKE 1 CAPSULE (50,000 UNITS TOTAL) BY MOUTH EVERY 7 (SEVEN) DAYS 8 capsule 0   No current facility-administered medications on file prior to visit.    BP (!) 182/63   Pulse 77   Temp 98 F (36.7 C)   Resp 20   Ht 5\' 1"  (1.549 m)   Wt 113 lb (51.3 kg)   SpO2 100%   BMI 21.35 kg/m       Objective:    Physical Exam   General Mental Status- Alert. General Appearance- Not in acute distress.   Skin Right side abdomen small tick bite site.  Minimal scab present and slight tender.  No portion of tick seen.  Neck Carotid Arteries- Normal color. Moisture- Normal Moisture. No carotid bruits. No JVD.  Chest and Lung Exam Auscultation: Breath Sounds:-Normal.  Cardiovascular Auscultation:Rythm- Regular. Murmurs & Other Heart Sounds:Auscultation of the heart reveals- No Murmurs.  Abdomen Inspection:-Inspeection Normal. Palpation/Percussion:Note:No mass. Palpation and Percussion of  the abdomen reveal- Non Tender, Non Distended + BS, no rebound or guarding.    Neurologic Cranial Nerve exam:- CN III-XII intact(No nystagmus), symmetric smile. Drift Test:- No drift. Romberg Exam:- difficult romberg. Little off balance. Heal to Toe Gait exam:-Normal. Finger to Nose:- Normal/Intact Strength:- 5/5 equal and symmetric strength both upper and lower extremities.       Assessment & Plan:  Recent moderate to severe headaches intermittently over the past 2 weeks.  With intermittent episodes of dizziness and occasional rare vomiting.  Prescribed meclizine to use for dizziness.  Rx advisement given.  Your blood pressure is moderate to severe high today.  Continue current BP medications and advised increasing metoprolol to 50 mg daily.  You have already taken one 25 mg tablets to take additional 25mg  this afternoon.  Will check electrolytes/labs today.  If blood pressure remains high and electrolytes normal could consider adding a diuretic.  CT head without contrast ordered.  You can go down to get that study at this afternoon.  It is on hold and call study.  All discussed results with the after radiologist reviews.  If CT head negative but worsening signs and symptoms as described and recommend ED evaluation as in some cases MRIs needed despite negative CT.  Diarrhea recently chronic with negative  stool studies.  GI has evaluated you.  Colonoscopy has been planned.  Recommend hydrate well and eat bland foods.  Continue/restart Bentyl.  Tick bite recently with some tenderness at the tick bite site.  Based on constellation of recent signs and symptoms and described fatigue recommend going ahead and starting doxycycline antibiotic.  Rx advisement given regarding possible loose stools.  Make sure he has some food in your stomach before taking the antibiotic.  If any severe GI symptoms or increased diarrhea let me know.  Can take probiotics over-the-counter and increase probiotics in diet.  Follow-up tomorrow for B12.  Follow-up in 7 days or as needed.  Mackie Pai, PA-C   Time spent with patient today was  41  minutes which consisted of chart rediew, discussing diagnosis, work up treatment and documentation.

## 2021-03-02 NOTE — Patient Instructions (Signed)
Recent moderate to severe headaches intermittently over the past 2 weeks.  With intermittent episodes of dizziness and occasional rare vomiting.  Prescribed meclizine to use for dizziness.  Rx advisement given.  Your blood pressure is moderate to severe high today.  Continue current BP medications and advised increasing metoprolol to 50 mg daily.  You have already taken one 25 mg tablets to take additional 25mg  this afternoon.  Will check electrolytes/labs today.  If blood pressure remains high and electrolytes normal could consider adding a diuretic.  CT head without contrast ordered.  You can go down to get that study at this afternoon.  It is on hold and call study.  All discussed results with the after radiologist reviews.  If CT head negative but worsening signs and symptoms as described and recommend ED evaluation as in some cases MRIs needed despite negative CT.  Diarrhea recently chronic with negative stool studies.  GI has evaluated you.  Colonoscopy has been planned.  Recommend hydrate well and eat bland foods.  Continue/restart Bentyl.  Tick bite recently with some tenderness at the tick bite site.  Based on constellation of recent signs and symptoms and described fatigue recommend going ahead and starting doxycycline antibiotic.  Rx advisement given regarding possible loose stools.  Make sure he has some food in your stomach before taking the antibiotic.  If any severe GI symptoms or increased diarrhea let me know.  Can take probiotics over-the-counter and increase probiotics in diet.  Follow-up tomorrow for B12.  Follow-up in 7 days or as needed.

## 2021-03-03 ENCOUNTER — Ambulatory Visit: Payer: Medicare Other

## 2021-03-03 LAB — COMPREHENSIVE METABOLIC PANEL
ALT: 10 U/L (ref 0–35)
AST: 15 U/L (ref 0–37)
Albumin: 4.6 g/dL (ref 3.5–5.2)
Alkaline Phosphatase: 40 U/L (ref 39–117)
BUN: 19 mg/dL (ref 6–23)
CO2: 25 mEq/L (ref 19–32)
Calcium: 10.7 mg/dL — ABNORMAL HIGH (ref 8.4–10.5)
Chloride: 104 mEq/L (ref 96–112)
Creatinine, Ser: 0.8 mg/dL (ref 0.40–1.20)
GFR: 75.75 mL/min (ref >60.00)
Glucose, Bld: 71 mg/dL (ref 70–99)
Potassium: 4.3 mEq/L (ref 3.5–5.1)
Sodium: 140 mEq/L (ref 135–145)
Total Bilirubin: 0.3 mg/dL (ref 0.2–1.2)
Total Protein: 7.3 g/dL (ref 6.0–8.3)

## 2021-03-03 LAB — CBC WITH DIFFERENTIAL/PLATELET
Basophils Absolute: 0.1 10*3/uL (ref 0.0–0.1)
Basophils Relative: 1.2 % (ref 0.0–3.0)
Eosinophils Absolute: 0.2 10*3/uL (ref 0.0–0.7)
Eosinophils Relative: 2.3 % (ref 0.0–5.0)
HCT: 35.8 % — ABNORMAL LOW (ref 36.0–46.0)
Hemoglobin: 11.8 g/dL — ABNORMAL LOW (ref 12.0–15.0)
Lymphocytes Relative: 30.5 % (ref 12.0–46.0)
Lymphs Abs: 2.5 10*3/uL (ref 0.7–4.0)
MCHC: 32.9 g/dL (ref 30.0–36.0)
MCV: 80 fl (ref 78.0–100.0)
Monocytes Absolute: 0.5 10*3/uL (ref 0.1–1.0)
Monocytes Relative: 5.8 % (ref 3.0–12.0)
Neutro Abs: 4.9 10*3/uL (ref 1.4–7.7)
Neutrophils Relative %: 60.2 % (ref 43.0–77.0)
Platelets: 428 10*3/uL — ABNORMAL HIGH (ref 150.0–400.0)
RBC: 4.48 Mil/uL (ref 3.87–5.11)
RDW: 15.1 % (ref 11.5–15.5)
WBC: 8.1 10*3/uL (ref 4.0–10.5)

## 2021-03-03 LAB — VITAMIN B12: Vitamin B-12: 834 pg/mL (ref 211–911)

## 2021-03-09 ENCOUNTER — Other Ambulatory Visit: Payer: Self-pay

## 2021-03-09 ENCOUNTER — Encounter: Payer: Self-pay | Admitting: Medical

## 2021-03-09 ENCOUNTER — Ambulatory Visit (INDEPENDENT_AMBULATORY_CARE_PROVIDER_SITE_OTHER): Payer: Medicare Other | Admitting: Medical

## 2021-03-09 VITALS — BP 148/60 | HR 77 | Resp 20 | Ht 61.0 in | Wt 111.0 lb

## 2021-03-09 DIAGNOSIS — R197 Diarrhea, unspecified: Secondary | ICD-10-CM

## 2021-03-09 DIAGNOSIS — I10 Essential (primary) hypertension: Secondary | ICD-10-CM

## 2021-03-09 DIAGNOSIS — E538 Deficiency of other specified B group vitamins: Secondary | ICD-10-CM | POA: Diagnosis not present

## 2021-03-09 DIAGNOSIS — R350 Frequency of micturition: Secondary | ICD-10-CM | POA: Diagnosis not present

## 2021-03-09 DIAGNOSIS — R8290 Unspecified abnormal findings in urine: Secondary | ICD-10-CM | POA: Diagnosis not present

## 2021-03-09 DIAGNOSIS — F32A Depression, unspecified: Secondary | ICD-10-CM

## 2021-03-09 LAB — POC URINALSYSI DIPSTICK (AUTOMATED)
Blood, UA: NEGATIVE
Glucose, UA: NEGATIVE
Ketones, UA: 5
Leukocytes, UA: NEGATIVE
Nitrite, UA: NEGATIVE
Protein, UA: POSITIVE — AB
Spec Grav, UA: >=1.03 — AB (ref 1.010–1.025)
Urobilinogen, UA: 0.2 E.U./dL
pH, UA: 5.5 (ref 5.0–8.0)

## 2021-03-09 MED ORDER — VENLAFAXINE HCL ER 37.5 MG PO CP24: 37.5000 mg | ORAL_CAPSULE | Freq: Every day | ORAL | 3 refills | Status: DC

## 2021-03-09 NOTE — Patient Instructions (Addendum)
Your blood pressure is a lot better than it was previously.  Continue current regimen.  I do want you to get you upper arm blood pressure cuff over-the-counter and check your blood pressure 3 times daily 5 minutes apart.  Update me on Monday on those readings.  If blood pressure still elevated then would consider low-dose HCTZ as an add-on to your treatment.  History of diarrhea.  You are scheduled for colonoscopy upcoming week.  I want your blood pressure to be recently controlled prior to that study.   Frequent urination and odor over the last 4 to 5 days.  We will get a urinalysis and urine culture.  Then decide on antibiotic use.  B12 level is now upper range normal.  Want you to get B12 over-the-counter 5000 MCG dose and use 1 tablet daily.  History of depression.  Still depressed by PHQ-9 score.  We will increase your dose of Effexor to 75mg .  Providing serotonin syndrome education sheet since you are also on Pamelor.  If you have any insight signs or symptoms and stop both meds and let me know.  He has severe signs and symptoms and ED evaluation.  For vitamin D deficiency continue weekly vitamin D and will repeat vitamin D level in 4 weeks.  Follow-up in 4 weeks or as needed.   Serotonin Syndrome Serotonin is a chemical in your body (neurotransmitter) that helps to control several functions, such as:  Brain and nerve cell function.  Mood and emotions.  Memory.  Eating.  Sleeping.  Sexual activity.  Stress response. Having too much serotonin in your body can cause serotonin syndrome. This condition can be harmful to your brain and nerve cells. This can be a life-threatening condition. What are the causes? This condition may be caused by taking medicines or drugs that increase the level of serotonin in your body, such as:  Antidepressant medicines.  Migraine medicines.  Certain pain medicines.  Certain drugs, including ecstasy, LSD, cocaine, and  amphetamines.  Over-the-counter cough or cold medicines that contain dextromethorphan.  Certain herbal supplements, including St. John's wort, ginseng, and nutmeg. This condition usually occurs when you take these medicines or drugs in combination, but it can also happen with a high dose of a single medicine or drug. What increases the risk? You are more likely to develop this condition if:  You just started taking a medicine or drug that increases the level of serotonin in the body.  You recently increased the dose of a medicine or drug that increases the level of serotonin in the body.  You take more than one medicine or drug that increases the level of serotonin in the body. What are the signs or symptoms? Symptoms of this condition usually start within several hours of taking a medicine or drug. Symptoms may be mild or severe. Mild symptoms include:  Sweating.  Restlessness or agitation.  Muscle twitching or stiffness.  Rapid heart rate.  Nausea and vomiting.  Diarrhea.  Headache.  Shivering or goose bumps.  Confusion. Severe symptoms include:  Irregular heartbeat.  Seizures.  Loss of consciousness.  High fever. How is this diagnosed? This condition may be diagnosed based on:  Your medical history.  A physical exam.  Your prior use of drugs and medicines.  Blood or urine tests. These may be used to rule out other causes of your symptoms. How is this treated? The treatment for this condition depends on the severity of your symptoms.  For mild cases, stopping the  medicine or drug that caused your condition is usually all that is needed.  For moderate to severe cases, treatment in a hospital may be needed to prevent or manage life-threatening symptoms. This may include medicines to control your symptoms, IV fluids, interventions to support your breathing, and treatments to control your body temperature. Follow these instructions at home: Medicines  Take  over-the-counter and prescription medicines only as told by your health care provider. This is important.  Check with your health care provider before you start taking any new prescriptions, over-the-counter medicines, herbs, or supplements.  Avoid combining any medicines that can cause this condition to occur.   Lifestyle  Maintain a healthy lifestyle. ? Eat a healthy diet that includes plenty of vegetables, fruits, whole grains, low-fat dairy products, and lean protein. Do not eat a lot of foods that are high in fat, added sugars, or salt. ? Get the right amount and quality of sleep. Most adults need 7-9 hours of sleep each night. ? Make time to exercise, even if it is only for short periods of time. Most adults should exercise for at least 150 minutes each week. ? Do not drink alcohol. ? Do not use illegal drugs, and do not take medicines for reasons other than they are prescribed.   General instructions  Do not use any products that contain nicotine or tobacco, such as cigarettes and e-cigarettes. If you need help quitting, ask your health care provider.  Keep all follow-up visits as told by your health care provider. This is important. Contact a health care provider if:  Your symptoms do not improve or they get worse. Get help right away if you:  Have worsening confusion, severe headache, chest pain, high fever, seizures, or loss of consciousness.  Experience serious side effects of medicine, such as swelling of your face, lips, tongue, or throat.  Have serious thoughts about hurting yourself or others. These symptoms may represent a serious problem that is an emergency. Do not wait to see if the symptoms will go away. Get medical help right away. Call your local emergency services (911 in the [place] Do not drive yourself to the hospital. If you ever feel like you may hurt yourself or others, or have thoughts about taking your own life, get help right away. You can go to your  nearest emergency department or call:  Your local emergency services (911 in the [place]  A suicide crisis helpline, such as the [place] at 251-518-5209. This is open 24 hours a day. Summary  Serotonin is a brain chemical that helps to regulate the nervous system. High levels of serotonin in the body can cause serotonin syndrome, which is a very dangerous condition.  This condition may be caused by taking medicines or drugs that increase the level of serotonin in your body.  Treatment depends on the severity of your symptoms. For mild cases, stopping the medicine or drug that caused your condition is usually all that is needed.  Check with your health care provider before you start taking any new prescriptions, over-the-counter medicines, herbs, or supplements. This information is not intended to replace advice given to you by your health care provider. Make sure you discuss any questions you have with your health care provider. Document Revised: 11/30/2017 Document Reviewed: 11/30/2017 Elsevier Patient Education  2021 [employer].

## 2021-03-09 NOTE — Progress Notes (Signed)
Subjective:    Patient ID: Jasmine Keith, female    DOB: 1950/12/04, 70 y.o.   MRN: [id number]  HPI 62 Pt blood pressure today initially 149/51.   Pt has old machines. Her reading at 166/73. Some other readings above. But not as high as it was before.  I advised to  increase her metoprolol to 50 mg daily.  Pt states a lot less dizzy than before. No only transient dizziness on position change.   Ct head was negative.  Pt got 3 injections b12 and now b12 level higher range normal.   Hx of low vit D. Has 4 tabs weekly.   Pt has follow up with Dr. Lyndel Safe.         Review of Systems  Constitutional: Negative for chills, fatigue and fever.  Respiratory: Negative for cough, chest tightness, shortness of breath and wheezing.   Cardiovascular: Negative for chest pain and palpitations.  Gastrointestinal: Negative for abdominal distention, abdominal pain, blood in stool, constipation, diarrhea and nausea.  Genitourinary: Positive for frequency. Negative for difficulty urinating, dysuria and flank pain.       Odor to urine and some frequent urination for 5 day.  Musculoskeletal: Negative for back pain.  Skin: Negative for rash.  Neurological: Negative for dizziness and headaches.  Hematological: Negative for adenopathy. Does not bruise/bleed easily.  Psychiatric/Behavioral: Positive for dysphoric mood. Negative for behavioral problems, confusion, hallucinations and suicidal ideas. The patient is not nervous/anxious.     Past Medical History:  Diagnosis Date  . Anxiety   . Diabetes mellitus without complication ([place])   . Fibromyalgia   . GERD (gastroesophageal reflux disease)   . Hyperlipidemia   . Hypertension   . Osteoporosis      Social History   Socioeconomic History  . Marital status: Married    Spouse name: Not on file  . Number of children: Not on file  . Years of education: Not on file  . Highest education level: Not on file  Occupational History  . Not  on file  Tobacco Use  . Smoking status: Current Every Day Smoker    Packs/day: 0.50    Types: Cigarettes  . Smokeless tobacco: Never Used  Substance and Sexual Activity  . Alcohol use: Yes    Comment: Rare  . Drug use: Not Currently  . Sexual activity: Not on file  Other Topics Concern  . Not on file  Social History Narrative  . Not on file   Social Determinants of Health   Financial Resource Strain: Not on file  Food Insecurity: Not on file  Transportation Needs: Not on file  Physical Activity: Not on file  Stress: Not on file  Social Connections: Not on file  Intimate Partner Violence: Not on file    No past surgical history on file.  Family History  Problem Relation Age of Onset  . Diabetes Mother   . Kidney disease Mother   . Diabetes Father   . Cancer Sister        lung  . Kidney disease Sister   . Thyroid disease Neg Hx     No Known Allergies  Current Outpatient Medications on File Prior to Visit  Medication Sig Dispense Refill  . alendronate (FOSAMAX) 70 MG tablet Take 1 tablet (70 mg total) by mouth once a week. Take with a full glass of water on an empty stomach. 12 tablet 3  . amLODipine (NORVASC) 10 MG tablet Take 10 mg by mouth daily.    Marland Kitchen  atorvastatin (LIPITOR) 10 MG tablet TAKE 1 TABLET BY MOUTH EVERY DAY 90 tablet 1  . busPIRone (BUSPAR) 5 MG tablet Take 5 mg by mouth daily.    . busPIRone (BUSPAR) 7.5 MG tablet TAKE 1 TABLET BY MOUTH TWICE A DAY 180 tablet 1  . dicyclomine (BENTYL) 10 MG capsule TAKE 1 CAPSULE (10 MG TOTAL) BY MOUTH 3 (THREE) TIMES DAILY BEFORE MEALS. 90 capsule 0  . doxycycline (VIBRA-TABS) 100 MG tablet Take 1 tablet (100 mg total) by mouth 2 (two) times daily. Can give caps or generic. 20 tablet 0  . fenofibrate 160 MG tablet Take 160 mg by mouth daily.    Marland Kitchen gabapentin (NEURONTIN) 800 MG tablet Take 1 tablet (800 mg total) by mouth 2 (two) times daily. 60 tablet 2  . Insulin Detemir (LEVEMIR FLEXPEN ) Inject 36 Units into the  skin daily.    . insulin detemir (LEVEMIR) 100 unit/ml SOLN 36 units at night. 15 mL 3  . Insulin Pen Needle (BD PEN NEEDLE NANO U/F) 32G X 4 MM MISC Use one needle daily to inject insulin 30 each 5  . levothyroxine (SYNTHROID) 25 MCG tablet Take 1 tablet (25 mcg total) by mouth daily before breakfast. 90 tablet 1  . losartan (COZAAR) 100 MG tablet Take 1 tablet (100 mg total) by mouth daily. 90 tablet 3  . meclizine (ANTIVERT) 25 MG tablet Take 1 tablet (25 mg total) by mouth 3 (three) times daily as needed for dizziness. 30 tablet 0  . meloxicam (MOBIC) 7.5 MG tablet Take 7.5 mg by mouth daily as needed.    . metFORMIN (GLUCOPHAGE) 1000 MG tablet Take 1 tablet (1,000 mg total) by mouth 2 (two) times daily with a meal. 180 tablet 3  . metoprolol succinate (TOPROL-XL) 25 MG 24 hr tablet TAKE 1 TABLET BY MOUTH EVERY DAY 90 tablet 1  . pantoprazole (PROTONIX) 40 MG tablet Take 40 mg by mouth daily.    . Vitamin D, Ergocalciferol, (DRISDOL) 1.25 MG (50000 UNIT) CAPS capsule TAKE 1 CAPSULE (50,000 UNITS TOTAL) BY MOUTH EVERY 7 (SEVEN) DAYS 8 capsule 0   No current facility-administered medications on file prior to visit.    BP (!) 148/60   Pulse 77   Resp 20   Ht 5\' 1"  (1.549 m)   Wt 111 lb (50.3 kg)   SpO2 99%   BMI 20.97 kg/m      Objective:   Physical Exam  General Mental Status- Alert. General Appearance- Not in acute distress.   Skin General: Color- Normal Color. Moisture- Normal Moisture.  Neck Carotid Arteries- Normal color. Moisture- Normal Moisture. No carotid bruits. No JVD.  Chest and Lung Exam Auscultation: Breath Sounds:-Normal.  Cardiovascular Auscultation:Rythm- Regular. Murmurs & Other Heart Sounds:Auscultation of the heart reveals- No Murmurs.  Abdomen Inspection:-Inspeection Normal. Palpation/Percussion:Note:No mass. Palpation and Percussion of the abdomen reveal- Non Tender, Non Distended + BS, no rebound or guarding.    Neurologic Cranial Nerve  exam:- CN III-XII intact(No nystagmus), symmetric smile. Drift Test:- No drift. Romberg Exam:- Negative.  Heal to Toe Gait exam:-Normal. Finger to Nose:- Normal/Intact Strength:- 5/5 equal and symmetric strength both upper and lower extremities.  Back- no cva tenderness    Assessment & Plan:  Your blood pressure is a lot better than it was previously.  Continue current regimen.  I do want you to get you upper arm blood pressure cuff over-the-counter and check your blood pressure 3 times daily 5 minutes apart.  Update me on Monday  on those readings.  If blood pressure still elevated then would consider low-dose HCTZ as an add-on to your treatment.  History of diarrhea.  You are scheduled for colonoscopy upcoming week.  I want your blood pressure to be recently controlled prior to that study.   Frequent urination and odor over the last 4 to 5 days.  We will get a urinalysis and urine culture.  Then decide on antibiotic use.  B12 level is now upper range normal.  Want you to get B12 over-the-counter 5000 MCG dose and use 1 tablet daily.  History of depression.  Still depressed by PHQ-9 score.  We will increase your dose of Effexor to 75mg .  Providing serotonin syndrome education sheet since you are also on Pamelor.  If you have any insight signs or symptoms and stop both meds and let me know.  He has severe signs and symptoms and ED evaluation.  For vitamin D deficiency continue weekly vitamin D and will repeat vitamin D level in 4 weeks.  Follow-up in 4 weeks or as needed.  Mackie Pai, PA-C

## 2021-03-10 LAB — URINE CULTURE
MICRO NUMBER:: 11849193
Result:: NO GROWTH
SPECIMEN QUALITY:: ADEQUATE

## 2021-03-14 ENCOUNTER — Other Ambulatory Visit (INDEPENDENT_AMBULATORY_CARE_PROVIDER_SITE_OTHER): Payer: Medicare Other

## 2021-03-14 ENCOUNTER — Other Ambulatory Visit: Payer: Self-pay

## 2021-03-14 DIAGNOSIS — R5383 Other fatigue: Secondary | ICD-10-CM

## 2021-03-14 LAB — TSH: TSH: 3.9 u[IU]/mL (ref 0.35–4.50)

## 2021-03-14 LAB — T4, FREE: Free T4: 0.88 ng/dL (ref 0.60–1.60)

## 2021-03-15 ENCOUNTER — Telehealth: Payer: Self-pay | Admitting: Medical

## 2021-03-15 LAB — BASIC METABOLIC PANEL
BUN: 19 mg/dL (ref 6–23)
CO2: 19 mEq/L (ref 19–32)
Calcium: 9.9 mg/dL (ref 8.4–10.5)
Chloride: 108 mEq/L (ref 96–112)
Creatinine, Ser: 0.91 mg/dL (ref 0.40–1.20)
GFR: 64.88 mL/min (ref >60.00)
Glucose, Bld: 170 mg/dL — ABNORMAL HIGH (ref 70–99)
Potassium: 4.3 mEq/L (ref 3.5–5.1)
Sodium: 139 mEq/L (ref 135–145)

## 2021-03-15 MED ORDER — METOPROLOL SUCCINATE ER 50 MG PO TB24: 50.0000 mg | ORAL_TABLET | Freq: Every day | ORAL | 1 refills | Status: DC

## 2021-03-15 NOTE — Telephone Encounter (Addendum)
Is patient supposed to take medication BID or once daily ?      I have advised her to take two of  the 25 extended release metoprolol.  Her blood pressure had improved so advised her to increase to 50 mg.  I sent in a higher dose extended release version to take 1 tablet daily.  Mackie Pai, PA-C

## 2021-03-15 NOTE — Telephone Encounter (Signed)
Patient states Jasmine Keith told her to take 2 tablets instead of 1.   metoprolol succinate (TOPROL-XL) 25 MG 24 hr tablet [[id number]]    [id number] - [place], [place] - [address]  [address] [name] [address]  Phone:  [phone] Fax:  [phone]

## 2021-03-16 NOTE — Telephone Encounter (Signed)
Pt.notified

## 2021-03-18 ENCOUNTER — Ambulatory Visit (AMBULATORY_SURGERY_CENTER): Payer: Medicare Other | Admitting: Gastroenterology

## 2021-03-18 ENCOUNTER — Encounter: Payer: Self-pay | Admitting: Gastroenterology

## 2021-03-18 ENCOUNTER — Other Ambulatory Visit: Payer: Self-pay

## 2021-03-18 VITALS — BP 138/64 | HR 68 | Temp 97.1°F | Resp 20 | Ht 61.0 in | Wt 114.0 lb

## 2021-03-18 DIAGNOSIS — D122 Benign neoplasm of ascending colon: Secondary | ICD-10-CM | POA: Diagnosis not present

## 2021-03-18 DIAGNOSIS — K2950 Unspecified chronic gastritis without bleeding: Secondary | ICD-10-CM | POA: Diagnosis not present

## 2021-03-18 DIAGNOSIS — K2980 Duodenitis without bleeding: Secondary | ICD-10-CM | POA: Diagnosis not present

## 2021-03-18 DIAGNOSIS — R7309 Other abnormal glucose: Secondary | ICD-10-CM

## 2021-03-18 DIAGNOSIS — R110 Nausea: Secondary | ICD-10-CM | POA: Diagnosis not present

## 2021-03-18 DIAGNOSIS — K2970 Gastritis, unspecified, without bleeding: Secondary | ICD-10-CM

## 2021-03-18 DIAGNOSIS — K648 Other hemorrhoids: Secondary | ICD-10-CM

## 2021-03-18 DIAGNOSIS — K5730 Diverticulosis of large intestine without perforation or abscess without bleeding: Secondary | ICD-10-CM

## 2021-03-18 DIAGNOSIS — R197 Diarrhea, unspecified: Secondary | ICD-10-CM

## 2021-03-18 MED ORDER — DEXTROSE 50 % IV SOLN
25.0000 mL | Freq: Once | INTRAVENOUS | Status: AC
  Administered 2021-03-18: 25 mL via INTRAVENOUS

## 2021-03-18 MED ORDER — SODIUM CHLORIDE 0.9 % IV SOLN: 500.0000 mL | Freq: Once | INTRAVENOUS | Status: DC

## 2021-03-18 NOTE — Op Note (Addendum)
[facility] Patient Name: Jasmine Keith Procedure Date: 03/18/2021 2:39 PM MRN: [id number] Endoscopist: Jackquline Denmark , MD Age: 70 Referring MD:  Date of Birth: 1951/03/25 Gender: Female Account #: [id number] Procedure:                Colonoscopy Indications:              Diarrhea of unexplained origin Medicines:                Monitored Anesthesia Care Procedure:                Pre-Anesthesia Assessment:                           - Prior to the procedure, a History and Physical                            was performed, and patient medications and                            allergies were reviewed. The patient's tolerance of                            previous anesthesia was also reviewed. The risks                            and benefits of the procedure and the sedation                            options and risks were discussed with the patient.                            All questions were answered, and informed consent                            was obtained. Prior Anticoagulants: The patient has                            taken no previous anticoagulant or antiplatelet                            agents. ASA Grade Assessment: II - A patient with                            mild systemic disease. After reviewing the risks                            and benefits, the patient was deemed in                            satisfactory condition to undergo the procedure.                           After obtaining informed consent, the colonoscope  was passed under direct vision. Throughout the                            procedure, the patient's blood pressure, pulse, and                            oxygen saturations were monitored continuously. The                            Olympus PCF-[id number] (#[id number]) Colonoscope was                            introduced through the anus and advanced to the the                            cecum, identified by  appendiceal orifice and                            ileocecal valve. The colonoscopy was performed                            without difficulty. TI could not be intubated                            despite multiple attempts. The patient tolerated                            the procedure well. The quality of the bowel                            preparation was good. The ileocecal valve,                            appendiceal orifice, and rectum were photographed. Scope In: 3:02:39 PM Scope Out: 3:20:20 PM Scope Withdrawal Time: 0 hours 13 minutes 50 seconds  Total Procedure Duration: 0 hours 17 minutes 41 seconds  Findings:                 A 2 mm polyp was found in the proximal ascending                            colon. The polyp was sessile. The polyp was removed                            with a cold biopsy forceps. Resection and retrieval                            were complete.                           Multiple medium-mouthed diverticula were found in                            the sigmoid colon and few in ascending colon.  Multiple random colonic biopsies were obtained from                            throughout the colon to rule out microscopic                            colitis.                           Non-bleeding internal hemorrhoids were found during                            retroflexion. The hemorrhoids were moderate.                           The exam was otherwise without abnormality on                            direct and retroflexion views. Complications:            No immediate complications. Estimated Blood Loss:     Estimated blood loss: none. Impression:               - One 2 mm polyp in the proximal ascending colon,                            removed with a cold biopsy forceps. Resected and                            retrieved.                           - Pancolonic diverticulosis predominantly in the                            sigmoid  colon.                           - Non-bleeding internal hemorrhoids.                           - The examination was otherwise normal on direct                            and retroflexion views. Recommendation:           - Patient has a contact number available for                            emergencies. The signs and symptoms of potential                            delayed complications were discussed with the                            patient. Return to normal activities tomorrow.  Written discharge instructions were provided to the                            patient.                           - Resume previous diet.                           - Continue present medications.                           - Await pathology results.                           - Repeat colonoscopy for surveillance based on                            pathology results.                           - Return to GI clinic PRN.                           - The findings and recommendations were discussed                            with the patient's family. Jackquline Denmark, MD 03/18/2021 3:28:03 PM This report has been signed electronically.

## 2021-03-18 NOTE — Progress Notes (Signed)
Report to PACU, RN, vss, BBS= Clear.  

## 2021-03-18 NOTE — Progress Notes (Signed)
Called to room to assist during endoscopic procedure.  Patient ID and intended procedure confirmed with present staff. Received instructions for my participation in the procedure from the performing physician.  

## 2021-03-18 NOTE — Progress Notes (Signed)
VS by HC °

## 2021-03-18 NOTE — Op Note (Signed)
[facility] Patient Name: Jasmine Keith Procedure Date: 03/18/2021 2:40 PM MRN: [id number] Endoscopist: Jackquline Denmark , MD Age: 70 Referring MD:  Date of Birth: [birth year] Gender: Female Account #: [id number] Procedure:                Upper GI endoscopy Indications:              Nausea. Medicines:                Monitored Anesthesia Care Procedure:                Pre-Anesthesia Assessment:                           - Prior to the procedure, a History and Physical                            was performed, and patient medications and                            allergies were reviewed. The patient's tolerance of                            previous anesthesia was also reviewed. The risks                            and benefits of the procedure and the sedation                            options and risks were discussed with the patient.                            All questions were answered, and informed consent                            was obtained. Prior Anticoagulants: The patient has                            taken no previous anticoagulant or antiplatelet                            agents. ASA Grade Assessment: II - A patient with                            mild systemic disease. After reviewing the risks                            and benefits, the patient was deemed in                            satisfactory condition to undergo the procedure.                           After obtaining informed consent, the endoscope was  passed under direct vision. Throughout the                            procedure, the patient's blood pressure, pulse, and                            oxygen saturations were monitored continuously. The                            #[id number] was introduced through the mouth, and                            advanced to the second part of duodenum. The upper                            GI endoscopy was accomplished without  difficulty.                            The patient tolerated the procedure well. Scope In: Scope Out: Findings:                 The examined esophagus was normal, with                            well-defined Z-line at 36 cm, examined by NBI.                           Diffuse mild inflammation characterized by erythema                            was found in the gastric body with prominent                            gastric folds. Multiple biopsies were taken with a                            cold forceps for histology. Some biopsies were                            obtained using the tunnel technique.                           The examined duodenum was normal. Biopsies for                            histology were taken with a cold forceps for                            evaluation of celiac disease. Complications:            No immediate complications. Estimated Blood Loss:     Estimated blood loss: none. Impression:               - Mild gastritis.                           -  Mildly prominent gastric folds (biopsied) Recommendation:           - Patient has a contact number available for                            emergencies. The signs and symptoms of potential                            delayed complications were discussed with the                            patient. Return to normal activities tomorrow.                            Written discharge instructions were provided to the                            patient.                           - Resume previous diet.                           - Continue present medications.                           - Await pathology results.                           - The findings and recommendations were discussed                            with the patient's family. Jackquline Denmark, MD 03/18/2021 3:24:37 PM This report has been signed electronically.

## 2021-03-18 NOTE — Patient Instructions (Signed)
YOU HAD AN ENDOSCOPIC PROCEDURE TODAY AT THE  ENDOSCOPY CENTER:   Refer to the procedure report that was given to you for any specific questions about what was found during the examination.  If the procedure report does not answer your questions, please call your gastroenterologist to clarify.  If you requested that your care partner not be given the details of your procedure findings, then the procedure report has been included in a sealed envelope for you to review at your convenience later.  YOU SHOULD EXPECT: Some feelings of bloating in the abdomen. Passage of more gas than usual.  Walking can help get rid of the air that was put into your GI tract during the procedure and reduce the bloating. If you had a lower endoscopy (such as a colonoscopy or flexible sigmoidoscopy) you may notice spotting of blood in your stool or on the toilet paper. If you underwent a bowel prep for your procedure, you may not have a normal bowel movement for a few days.  Please Note:  You might notice some irritation and congestion in your nose or some drainage.  This is from the oxygen used during your procedure.  There is no need for concern and it should clear up in a day or so.  SYMPTOMS TO REPORT IMMEDIATELY:   Following lower endoscopy (colonoscopy or flexible sigmoidoscopy):  Excessive amounts of blood in the stool  Significant tenderness or worsening of abdominal pains  Swelling of the abdomen that is new, acute  Fever of 100F or higher   Following upper endoscopy (EGD)  Vomiting of blood or coffee ground material  New chest pain or pain under the shoulder blades  Painful or persistently difficult swallowing  New shortness of breath  Fever of 100F or higher  Black, tarry-looking stools  For urgent or emergent issues, a gastroenterologist can be reached at any hour by calling [phone]. Do not use MyChart messaging for urgent concerns.    DIET:  We do recommend a small meal at first, but  then you may proceed to your regular diet.  Drink plenty of fluids but you should avoid alcoholic beverages for 24 hours.  ACTIVITY:  You should plan to take it easy for the rest of today and you should NOT DRIVE or use heavy machinery until tomorrow (because of the sedation medicines used during the test).    FOLLOW UP: Our staff will call the number listed on your records 48-72 hours following your procedure to check on you and address any questions or concerns that you may have regarding the information given to you following your procedure. If we do not reach you, we will leave a message.  We will attempt to reach you two times.  During this call, we will ask if you have developed any symptoms of COVID 19. If you develop any symptoms (ie: fever, flu-like symptoms, shortness of breath, cough etc.) before then, please call [phone].  If you test positive for Covid 19 in the 2 weeks post procedure, please call and report this information to us.    If any biopsies were taken you will be contacted by phone or by letter within the next 1-3 weeks.  Please call us at [phone] if you have not heard about the biopsies in 3 weeks.    SIGNATURES/CONFIDENTIALITY: You and/or your care partner have signed paperwork which will be entered into your electronic medical record.  These signatures attest to the fact that that the information above on   your After Visit Summary has been reviewed and is understood.  Full responsibility of the confidentiality of this discharge information lies with you and/or your care-partner. 

## 2021-03-22 ENCOUNTER — Telehealth: Payer: Self-pay | Admitting: *Deleted

## 2021-03-22 NOTE — Telephone Encounter (Signed)
Attempted 2nd phone call. No answer. Called twice and rings a couple of times and goes to busy signal. Unable to leave message.

## 2021-03-22 NOTE — Telephone Encounter (Signed)
Attempted f/u phone call. No answer. Left message. °

## 2021-04-03 ENCOUNTER — Other Ambulatory Visit: Payer: Self-pay | Admitting: Medical

## 2021-04-05 ENCOUNTER — Encounter: Payer: Self-pay | Admitting: Medical

## 2021-04-05 ENCOUNTER — Ambulatory Visit (INDEPENDENT_AMBULATORY_CARE_PROVIDER_SITE_OTHER): Payer: Medicare Other | Admitting: Medical

## 2021-04-05 ENCOUNTER — Other Ambulatory Visit: Payer: Self-pay

## 2021-04-05 VITALS — BP 128/60 | HR 75 | Resp 18 | Ht 61.0 in | Wt 115.0 lb

## 2021-04-05 DIAGNOSIS — E559 Vitamin D deficiency, unspecified: Secondary | ICD-10-CM

## 2021-04-05 DIAGNOSIS — K580 Irritable bowel syndrome with diarrhea: Secondary | ICD-10-CM | POA: Diagnosis not present

## 2021-04-05 DIAGNOSIS — I10 Essential (primary) hypertension: Secondary | ICD-10-CM | POA: Diagnosis not present

## 2021-04-05 DIAGNOSIS — E119 Type 2 diabetes mellitus without complications: Secondary | ICD-10-CM | POA: Diagnosis not present

## 2021-04-05 DIAGNOSIS — F32A Depression, unspecified: Secondary | ICD-10-CM | POA: Diagnosis not present

## 2021-04-05 LAB — HEMOGLOBIN A1C: Hgb A1c MFr Bld: 6.9 % — ABNORMAL HIGH (ref 4.6–6.5)

## 2021-04-05 MED ORDER — VENLAFAXINE HCL ER 75 MG PO CP24: 75.0000 mg | ORAL_CAPSULE | Freq: Every day | ORAL | 3 refills | Status: DC

## 2021-04-05 NOTE — Progress Notes (Signed)
Subjective:    Patient ID: Jasmine [facility], female    DOB: 04/19/1951, 70 y.o.   MRN: [id number]  HPI   Pt in for follow up.  Pt has hx of depression. Pt states her mood is overall better compared to last visit. Pt did not increase her effexor to 75. Pt had 37.5 mg tabs. I believe I had asked her to take 2 TABS.   Pt blood pressure mild elevated today.I had asked her to check bp daily. She states checked bp twice and was 140/90 range. Pt is on losartan 100 mg daily. Pt also is on metoprolol xl 50 mg daiy.  Pt did get the colonoscopy done. 1 2 mm polyp found. Pt diarrhea symptoms better with bentyl.  Hx of diabetes. Last a1c was 6.9. Pt is on metformin 1000 mg twice daily.          Review of Systems  Constitutional: Negative for chills and fatigue.  Respiratory: Negative for cough.   Cardiovascular: Negative for chest pain and palpitations.  Gastrointestinal: Negative for abdominal pain.  Endocrine: Negative for polydipsia, polyphagia and polyuria.  Genitourinary: Negative for dysuria.  Musculoskeletal: Negative for back pain.  Neurological: Negative for dizziness, syncope, weakness, light-headedness and numbness.  Psychiatric/Behavioral: Positive for dysphoric mood. Negative for agitation and confusion. The patient is nervous/anxious.      Past Medical History:  Diagnosis Date  . Anxiety   . Diabetes mellitus without complication ([place])   . Fibromyalgia   . GERD (gastroesophageal reflux disease)   . Hyperlipidemia   . Hypertension   . Osteoporosis   . Thyroid disease      Social History   Socioeconomic History  . Marital status: Married    Spouse name: Not on file  . Number of children: Not on file  . Years of education: Not on file  . Highest education level: Not on file  Occupational History  . Not on file  Tobacco Use  . Smoking status: Current Every Day Smoker    Packs/day: 0.50    Types: Cigarettes  . Smokeless tobacco: Never Used  Vaping Use   . Vaping Use: Never used  Substance and Sexual Activity  . Alcohol use: Yes    Comment: Rare  . Drug use: Not Currently  . Sexual activity: Not on file  Other Topics Concern  . Not on file  Social History Narrative  . Not on file   Social Determinants of Health   Financial Resource Strain: Not on file  Food Insecurity: Not on file  Transportation Needs: Not on file  Physical Activity: Not on file  Stress: Not on file  Social Connections: Not on file  Intimate Partner Violence: Not on file    Past Surgical History:  Procedure Laterality Date  . coronary stent placed  1993    Family History  Problem Relation Age of Onset  . Diabetes Mother   . Kidney disease Mother   . Diabetes Father   . Cancer Sister        lung  . Kidney disease Sister   . Thyroid disease Neg Hx   . Colon cancer Neg Hx   . Esophageal cancer Neg Hx   . Rectal cancer Neg Hx   . Stomach cancer Neg Hx     No Known Allergies  Current Outpatient Medications on File Prior to Visit  Medication Sig Dispense Refill  . alendronate (FOSAMAX) 70 MG tablet Take 1 tablet (70 mg total) by mouth once  a week. Take with a full glass of water on an empty stomach. 12 tablet 3  . amLODipine (NORVASC) 10 MG tablet Take 10 mg by mouth daily.    . busPIRone (BUSPAR) 7.5 MG tablet TAKE 1 TABLET BY MOUTH TWICE A DAY 180 tablet 1  . dicyclomine (BENTYL) 10 MG capsule TAKE 1 CAPSULE (10 MG TOTAL) BY MOUTH 3 (THREE) TIMES DAILY BEFORE MEALS. 90 capsule 0  . fenofibrate 160 MG tablet Take 160 mg by mouth daily.    Marland Kitchen gabapentin (NEURONTIN) 800 MG tablet Take 1 tablet (800 mg total) by mouth 2 (two) times daily. 60 tablet 2  . Insulin Detemir (LEVEMIR FLEXPEN [place]) Inject 36 Units into the skin daily.    . insulin detemir (LEVEMIR) 100 unit/ml SOLN 36 units at night. 15 mL 3  . Insulin Pen Needle (BD PEN NEEDLE NANO U/F) 32G X 4 MM MISC Use one needle daily to inject insulin 30 each 5  . levothyroxine (SYNTHROID) 25 MCG tablet  Take 1 tablet (25 mcg total) by mouth daily before breakfast. 90 tablet 1  . losartan (COZAAR) 100 MG tablet Take 1 tablet (100 mg total) by mouth daily. 90 tablet 3  . meclizine (ANTIVERT) 25 MG tablet Take 1 tablet (25 mg total) by mouth 3 (three) times daily as needed for dizziness. 30 tablet 0  . meloxicam (MOBIC) 7.5 MG tablet Take 7.5 mg by mouth daily as needed.    . metFORMIN (GLUCOPHAGE) 1000 MG tablet Take 1 tablet (1,000 mg total) by mouth 2 (two) times daily with a meal. 180 tablet 3  . metoprolol succinate (TOPROL-XL) 50 MG 24 hr tablet Take 1 tablet (50 mg total) by mouth daily. Take with or immediately following a meal. 90 tablet 1  . pantoprazole (PROTONIX) 40 MG tablet Take 40 mg by mouth daily.    . Vitamin D, Ergocalciferol, (DRISDOL) 1.25 MG (50000 UNIT) CAPS capsule TAKE 1 CAPSULE (50,000 UNITS TOTAL) BY MOUTH EVERY 7 (SEVEN) DAYS 8 capsule 0   Current Facility-Administered Medications on File Prior to Visit  Medication Dose Route Frequency Provider Last Rate Last Admin  . 0.9 %  sodium chloride infusion  500 mL Intravenous Once Jackquline Denmark, MD        BP 128/60   Pulse 75   Resp 18   Ht 5\' 1"  (1.549 m)   Wt 115 lb (52.2 kg)   SpO2 100%   BMI 21.73 kg/m       Objective:   Physical Exam  General Mental Status- Alert. General Appearance- Not in acute distress.   Skin General: Color- Normal Color. Moisture- Normal Moisture.  Neck Carotid Arteries- Normal color. Moisture- Normal Moisture. No carotid bruits. No JVD.  Chest and Lung Exam Auscultation: Breath Sounds:-Normal.  Cardiovascular Auscultation:Rythm- Regular. Murmurs & Other Heart Sounds:Auscultation of the heart reveals- No Murmurs.  Abdomen Inspection:-Inspeection Normal. Palpation/Percussion:Note:No mass. Palpation and Percussion of the abdomen reveal- Non Tender, Non Distended + BS, no rebound or guarding.    Neurologic Cranial Nerve exam:- CN III-XII intact(No nystagmus), symmetric  smile. Strength:- 5/5 equal and symmetric strength both upper and lower extremities.      Assessment & Plan:  History of hypertension with blood pressure well controlled on second check today.  Continue losartan 100 mg daily and metoprolol XL 50 mg daily.  You recommend that you get new blood pressure cuff and check blood pressures 3-4 times a week.  Want to see your blood pressure less than 140/90.  Presently recommend continuing same BP med regimen.  If BP trending above 140/90 then would add additional medication.  History of depression with mood slightly increased recently.  Though you have moderate high depression score.  Going to prescribe Effexor 75mg  daily.  You had been on 37.5 mg in the past.  History of IBS type signs and symptoms.  Colonoscopy only showed small 2 mm polyp.  Continue bentyl.  History of diabetes.  Last A1c was 6.9.  We will get repeat A1c today.  Continue metformin.  If A1c less than 7 will make any dose adjustments.  Follow-up in 1 month or as needed.

## 2021-04-05 NOTE — Patient Instructions (Signed)
History of hypertension with blood pressure well controlled on second check today.  Continue losartan 100 mg daily and metoprolol XL 50 mg daily.  You recommend that you get new blood pressure cuff and check blood pressures 3-4 times a week.  Want to see your blood pressure less than 140/90.  Presently recommend continuing same BP med regimen.  If BP trending above 140/90 then would add additional medication.  History of depression with mood slightly increased recently.  Though you have moderate high depression score.  Going to prescribe Effexor 75mg  daily.  You had been on 37.5 mg in the past.  History of IBS type signs and symptoms.  Colonoscopy only showed small 2 mm polyp.  Continue bentyl.  History of diabetes.  Last A1c was 6.9.  We will get repeat A1c today.  Continue metformin.  If A1c less than 7 will make any dose adjustments.  Follow-up in 1 month or as needed.

## 2021-04-06 ENCOUNTER — Encounter: Payer: Self-pay | Admitting: Gastroenterology

## 2021-04-06 ENCOUNTER — Ambulatory Visit: Payer: Medicare Other | Admitting: Medical

## 2021-04-12 ENCOUNTER — Other Ambulatory Visit: Payer: Self-pay | Admitting: Medical

## 2021-04-29 ENCOUNTER — Other Ambulatory Visit: Payer: Self-pay | Admitting: Medical

## 2021-04-30 ENCOUNTER — Other Ambulatory Visit: Payer: Self-pay | Admitting: Medical

## 2021-05-10 ENCOUNTER — Ambulatory Visit: Payer: Medicare Other | Admitting: Medical

## 2021-05-11 ENCOUNTER — Ambulatory Visit (HOSPITAL_COMMUNITY)
Admission: EM | Admit: 2021-05-11 | Discharge: 2021-05-11 | Disposition: A | Discharge status: Home / Self Care | Payer: Medicare Other

## 2021-05-11 ENCOUNTER — Emergency Department (HOSPITAL_COMMUNITY)
Admission: EM | Admit: 2021-05-11 | Discharge: 2021-05-11 | Disposition: A | Discharge status: Home / Self Care | Payer: Medicare Other | Attending: Emergency Medicine | Admitting: Emergency Medicine

## 2021-05-11 ENCOUNTER — Encounter (HOSPITAL_COMMUNITY): Payer: Self-pay | Admitting: Emergency Medicine

## 2021-05-11 ENCOUNTER — Other Ambulatory Visit: Payer: Self-pay

## 2021-05-11 ENCOUNTER — Encounter (HOSPITAL_COMMUNITY): Payer: Self-pay | Admitting: *Deleted

## 2021-05-11 DIAGNOSIS — Z5321 Procedure and treatment not carried out due to patient leaving prior to being seen by health care provider: Secondary | ICD-10-CM | POA: Insufficient documentation

## 2021-05-11 DIAGNOSIS — L0211 Cutaneous abscess of neck: Secondary | ICD-10-CM | POA: Diagnosis not present

## 2021-05-11 DIAGNOSIS — R509 Fever, unspecified: Secondary | ICD-10-CM

## 2021-05-11 DIAGNOSIS — L02811 Cutaneous abscess of head [any part, except face]: Secondary | ICD-10-CM

## 2021-05-11 LAB — CBC WITH DIFFERENTIAL/PLATELET
Abs Immature Granulocytes: 0.03 10*3/uL (ref 0.00–0.07)
Basophils Absolute: 0 10*3/uL (ref 0.0–0.1)
Basophils Relative: 1 %
Eosinophils Absolute: 0 10*3/uL (ref 0.0–0.5)
Eosinophils Relative: 0 %
HCT: 34.3 % — ABNORMAL LOW (ref 36.0–46.0)
Hemoglobin: 11.5 g/dL — ABNORMAL LOW (ref 12.0–15.0)
Immature Granulocytes: 1 %
Lymphocytes Relative: 16 %
Lymphs Abs: 0.8 10*3/uL (ref 0.7–4.0)
MCH: 26.7 pg (ref 26.0–34.0)
MCHC: 33.5 g/dL (ref 30.0–36.0)
MCV: 79.8 fL — ABNORMAL LOW (ref 80.0–100.0)
Monocytes Absolute: 0.4 10*3/uL (ref 0.1–1.0)
Monocytes Relative: 7 %
Neutro Abs: 4 10*3/uL (ref 1.7–7.7)
Neutrophils Relative %: 75 %
Platelets: 289 10*3/uL (ref 150–400)
RBC: 4.3 MIL/uL (ref 3.87–5.11)
RDW: 14.3 % (ref 11.5–15.5)
WBC: 5.2 10*3/uL (ref 4.0–10.5)
nRBC: 0 % (ref 0.0–0.2)

## 2021-05-11 LAB — COMPREHENSIVE METABOLIC PANEL
ALT: 25 U/L (ref 0–44)
AST: 37 U/L (ref 15–41)
Albumin: 4.2 g/dL (ref 3.5–5.0)
Alkaline Phosphatase: 36 U/L — ABNORMAL LOW (ref 38–126)
Anion gap: 10 (ref 5–15)
BUN: 15 mg/dL (ref 8–23)
CO2: 23 mmol/L (ref 22–32)
Calcium: 9.5 mg/dL (ref 8.9–10.3)
Chloride: 104 mmol/L (ref 98–111)
Creatinine, Ser: 0.97 mg/dL (ref 0.44–1.00)
GFR, Estimated: >60 mL/min (ref >60)
Glucose, Bld: 77 mg/dL (ref 70–99)
Potassium: 4.4 mmol/L (ref 3.5–5.1)
Sodium: 137 mmol/L (ref 135–145)
Total Bilirubin: 0.5 mg/dL (ref 0.3–1.2)
Total Protein: 7.4 g/dL (ref 6.5–8.1)

## 2021-05-11 LAB — LACTIC ACID, PLASMA: Lactic Acid, Venous: 1.5 mmol/L (ref 0.5–1.9)

## 2021-05-11 NOTE — ED Provider Notes (Signed)
Emergency Medicine Provider Triage Evaluation Note  Jasmine Keith , a 70 y.o. female  was evaluated in triage.  Pt complains of abscesses to the top of the head, scalp.  Prior extensive history including diabetes.  Evaluated urgent care previously, and sent here for further evaluation along with work-up for sepsis.  Review of Systems  Positive: Wound, fever Negative:   Physical Exam  BP (!) 157/76 (BP Location: Left Arm)   Pulse 93   Temp 99.8 F (37.7 C) (Oral)   Resp 18   SpO2 100%  Gen:   Awake, no distress   Resp:  Normal effort  MSK:   Moves extremities without difficulty  Other:   One 1.5x1.5cm abscess behind R ear, exquisitely tender. R sided cervical lymphadenopathy. Effusion at base of neck Medical Decision Making  Medically screening exam initiated at 2:30 PM.  Appropriate orders placed.  Lauri Purdum [facility] was informed that the remainder of the evaluation will be completed by another provider, this initial triage assessment does not replace that evaluation, and the importance of remaining in the ED until their evaluation is complete.     Janeece Fitting, PA-C 05/11/21 1432    Daleen Bo, MD 05/12/21 [phone]

## 2021-05-11 NOTE — ED Triage Notes (Signed)
Patient reports multiple scalp abscesses x4 days. Fever last night. Sent by UC for further evaluation.

## 2021-05-11 NOTE — ED Notes (Signed)
PT refused standing oeder for tylenol for temp of 100.6.

## 2021-05-11 NOTE — Discharge Instructions (Addendum)
-  [facility] for further evaluation of your head abscesses and fevers. I'm concerned you have an infection that's going into your bloodstream.  This needs prompt intervention with imaging, lab work, possible drainage and IV antibiotics.

## 2021-05-11 NOTE — ED Notes (Signed)
Pt left d/t wait time. States that she could not wait any longer.

## 2021-05-11 NOTE — ED Provider Notes (Signed)
[facility]    CSN: [id number] Arrival date & time: 05/11/21  1029      History   Chief Complaint Chief Complaint  Patient presents with   Fever    HPI Jasmine Keith is a 70 y.o. female presenting with multiple abscesses to head and scalp.  Medical history diabetes, fibromyalgia, hyperlipidemia, hypertension, thyroid disease, osteoporosis.  States that her symptoms began about 5 days ago with 2 scalp abscesses, these have improved somewhat but now she has a large and painful lump behind her right ear with swelling and pain extending down into her neck.  Some pain radiating into her ear as well, but denies hearing changes, tinnitus, dizziness.  Endorses chills and generalized body aches and generalized weakness.  Denies chest pain, shortness of breath, dizziness.  HPI  Past Medical History:  Diagnosis Date   Anxiety    Diabetes mellitus without complication ([place])    Fibromyalgia    GERD (gastroesophageal reflux disease)    Hyperlipidemia    Hypertension    Osteoporosis    Thyroid disease     Patient Active Problem List   Diagnosis Date Noted   Fatigue 12/02/2020   Osteopenia of hip 12/02/2020   Hypercalcemia 12/02/2020    Past Surgical History:  Procedure Laterality Date   coronary stent placed  1993    OB History   No obstetric history on file.      Home Medications    Prior to Admission medications   Medication Sig Start Date End Date Taking? Authorizing Provider  alendronate (FOSAMAX) 70 MG tablet Take 1 tablet (70 mg total) by mouth once a week. Take with a full glass of water on an empty stomach. 07/23/20   Saguier, Percell Miller, PA-C  amLODipine (NORVASC) 10 MG tablet Take 10 mg by mouth daily.    [provider]  busPIRone (BUSPAR) 7.5 MG tablet TAKE 1 TABLET BY MOUTH TWICE A DAY 02/28/21   Saguier, Percell Miller, PA-C  dicyclomine (BENTYL) 10 MG capsule TAKE 1 CAPSULE (10 MG TOTAL) BY MOUTH 3 (THREE) TIMES DAILY BEFORE MEALS. 04/29/21    Saguier, Percell Miller, PA-C  fenofibrate 160 MG tablet Take 160 mg by mouth daily.    [provider]  gabapentin (NEURONTIN) 800 MG tablet TAKE 1 TABLET BY MOUTH 2 TIMES DAILY. 04/12/21   Saguier, Percell Miller, PA-C  Insulin Detemir (LEVEMIR FLEXPEN [place]) Inject 36 Units into the skin daily.    [provider]  insulin detemir (LEVEMIR) 100 unit/ml SOLN 36 units at night. 01/03/21   Saguier, Percell Miller, PA-C  Insulin Pen Needle (BD PEN NEEDLE NANO U/F) 32G X 4 MM MISC Use one needle daily to inject insulin 12/02/20   Elayne Snare, MD  levothyroxine (SYNTHROID) 25 MCG tablet Take 1 tablet (25 mcg total) by mouth daily before breakfast. 01/14/21   Elayne Snare, MD  losartan (COZAAR) 100 MG tablet Take 1 tablet (100 mg total) by mouth daily. 11/29/20   Saguier, Percell Miller, PA-C  meclizine (ANTIVERT) 25 MG tablet Take 1 tablet (25 mg total) by mouth 3 (three) times daily as needed for dizziness. 03/02/21   Saguier, Percell Miller, PA-C  meloxicam (MOBIC) 7.5 MG tablet Take 7.5 mg by mouth daily as needed. 07/07/20   [provider]  metFORMIN (GLUCOPHAGE) 1000 MG tablet Take 1 tablet (1,000 mg total) by mouth 2 (two) times daily with a meal. 11/29/20   Saguier, Percell Miller, PA-C  metoprolol succinate (TOPROL-XL) 50 MG 24 hr tablet Take 1 tablet (50 mg total) by  mouth daily. Take with or immediately following a meal. 03/15/21   Saguier, Percell Miller, PA-C  pantoprazole (PROTONIX) 40 MG tablet Take 40 mg by mouth daily.    [provider]  venlafaxine XR (EFFEXOR-XR) 75 MG 24 hr capsule TAKE 1 CAPSULE BY MOUTH DAILY WITH BREAKFAST. 04/29/21   Saguier, Percell Miller, PA-C  Vitamin D, Ergocalciferol, (DRISDOL) 1.25 MG (50000 UNIT) CAPS capsule TAKE 1 CAPSULE (50,000 UNITS TOTAL) BY MOUTH EVERY 7 (SEVEN) DAYS 05/03/21   Saguier, Percell Miller, PA-C    Family History Family History  Problem Relation Age of Onset   Diabetes Mother    Kidney disease Mother    Diabetes Father    Cancer Sister        lung   Kidney disease Sister     Thyroid disease Neg Hx    Colon cancer Neg Hx    Esophageal cancer Neg Hx    Rectal cancer Neg Hx    Stomach cancer Neg Hx     Social History Social History   Tobacco Use   Smoking status: Every Day    Packs/day: 0.50    Pack years: 0.00    Types: Cigarettes   Smokeless tobacco: Never  Vaping Use   Vaping Use: Never used  Substance Use Topics   Alcohol use: Yes    Comment: Rare   Drug use: Not Currently     Allergies   Patient has no known allergies.   Review of Systems Review of Systems  Musculoskeletal:        Abscesses scalp and head  All other systems reviewed and are negative.   Physical Exam Triage Vital Signs ED Triage Vitals  Enc Vitals Group     BP 05/11/21 1157 (!) 150/66     Pulse Rate 05/11/21 1157 87     Resp 05/11/21 1157 20     Temp 05/11/21 1157 (!) 100.7 F (38.2 C)     Temp Source 05/11/21 1157 Oral     SpO2 05/11/21 1157 100 %     Weight --      Height --      Head Circumference --      Peak Flow --      Pain Score 05/11/21 1158 8     Pain Loc --      Pain Edu? --      Excl. in [place]? --    No data found.  Updated Vital Signs BP (!) 150/66   Pulse 87   Temp (!) 100.7 F (38.2 C) (Oral)   Resp 20   SpO2 100%   Visual Acuity Right Eye Distance:   Left Eye Distance:   Bilateral Distance:    Right Eye Near:   Left Eye Near:    Bilateral Near:     Physical Exam Vitals reviewed.  Constitutional:      General: She is not in acute distress.    Appearance: Normal appearance. She is ill-appearing. She is not diaphoretic.  HENT:     Head: Normocephalic and atraumatic. Mass present.     Comments: Multiple abscesses to scalp. One 1.5x1.5cm abscess behind R ear, exquisitely tender. R sided cervical lymphadenopathy. Effusion at base of neck.     Right Ear: Hearing, tympanic membrane, ear canal and external ear normal. No middle ear effusion. There is no impacted cerumen. No foreign body. Tympanic membrane is not erythematous,  retracted or bulging.     Left Ear: Hearing, tympanic membrane, ear canal and external ear normal.  No middle ear effusion. There is no impacted cerumen. No foreign body. Tympanic membrane is not erythematous, retracted or bulging.  Cardiovascular:     Rate and Rhythm: Normal rate and regular rhythm.     Heart sounds: Normal heart sounds.  Pulmonary:     Effort: Pulmonary effort is normal.     Breath sounds: Normal breath sounds.  Skin:    General: Skin is warm.  Neurological:     General: No focal deficit present.     Mental Status: She is alert and oriented to person, place, and time.  Psychiatric:        Mood and Affect: Mood normal.        Behavior: Behavior normal.        Thought Content: Thought content normal.        Judgment: Judgment normal.     UC Treatments / Results  Labs (all labs ordered are listed, but only abnormal results are displayed) Labs Reviewed - No data to display  EKG   Radiology No results found.  Procedures Procedures (including critical care time)  Medications Ordered in UC Medications - No data to display  Initial Impression / Assessment and Plan / UC Course  I have reviewed the triage vital signs and the nursing notes.  Pertinent labs & imaging results that were available during my care of the patient were reviewed by me and considered in my medical decision making (see chart for details).     This patient is a very pleasant 70 y.o. year old female presenting with multiple abscesses to head.  Febrile but nontachycardic.  Given concern for deep tissue abscess of head and neck, even mastoiditis, I am recommending this patient head straight to the emergency department.  Suspect she will require imaging and possible I&D.  May require IV antibiotics.  She is hemodynamically stable for transport in personal vehicle driven by husband.  Final Clinical Impressions(s) / UC Diagnoses   Final diagnoses:  Abscess of multiple sites of head and neck   Febrile illness     Discharge Instructions      -[facility] straight [facility] for further evaluation of your head abscesses and fevers. I'm concerned you have an infection that's going into your bloodstream.  This needs prompt intervention with imaging, lab work, possible drainage and IV antibiotics.   ED Prescriptions   None    PDMP not reviewed this encounter.   Hazel Sams, PA-C 05/11/21 1306

## 2021-05-12 ENCOUNTER — Telehealth: Payer: Self-pay

## 2021-05-12 NOTE — Telephone Encounter (Signed)
Note opened in error.

## 2021-05-12 NOTE — Telephone Encounter (Signed)
Patient called stating she was seen sent to urgent care for abscess on head and behind ear. She was advised after 2 hours at UC that they would not be able to do anything for the abscess. She proceeded to [employer] work was completed however patient waited 6 hours and left without being seen.   Her husband grabbed the phone from her and expressed his frustration with [place] not being able to do anything. I tried to see if anyone could see her today but no appointment available. Her husband expressed that "we are his doctor and we should be able to do something, this is unacceptable" I expressed my apologies and advised him we are doing all we could. He stated she had to be "all but carried in the ER as she was so weak"  I spoke with Maudie Mercury our Production assistant, radio. She did advise me to place on Dr. Serita Sheller schedule tomorrow for evaluation. I have advised her to proceed back to ER for any worsening symptoms such as fever, vomiting, nausea, chest pain. Patient agreed.

## 2021-05-13 ENCOUNTER — Ambulatory Visit (INDEPENDENT_AMBULATORY_CARE_PROVIDER_SITE_OTHER): Payer: Medicare Other | Admitting: Family Medicine

## 2021-05-13 ENCOUNTER — Other Ambulatory Visit: Payer: Self-pay

## 2021-05-13 ENCOUNTER — Encounter: Payer: Self-pay | Admitting: Family Medicine

## 2021-05-13 VITALS — BP 142/72 | HR 84 | Temp 98.6°F | Ht 61.0 in | Wt 115.2 lb

## 2021-05-13 DIAGNOSIS — I889 Nonspecific lymphadenitis, unspecified: Secondary | ICD-10-CM | POA: Diagnosis not present

## 2021-05-13 MED ORDER — AMOXICILLIN-POT CLAVULANATE 875-125 MG PO TABS: 1.0000 | ORAL_TABLET | Freq: Two times a day (BID) | ORAL | 0 refills | Status: DC

## 2021-05-13 MED ORDER — PREDNISONE 20 MG PO TABS: 40.0000 mg | ORAL_TABLET | Freq: Every day | ORAL | 0 refills | Status: AC

## 2021-05-13 NOTE — Patient Instructions (Signed)
Take the prednisone first. If no improvement by Sunday, please start the Augmentin/antibiotic.  Ice/cold pack over area for 10-15 min twice daily.  Stay hydrated.  Let us know if you need anything.

## 2021-05-13 NOTE — Progress Notes (Signed)
Chief Complaint  Patient presents with   Abscess    Back of right ear and on top/side of head-right side.    Jasmine Keith is a 70 y.o. female here for a skin complaint.  She is here with her husband.  Duration: 1 week Location: R ear/side of head Pruritic? No Painful? Yes Drainage? No Sick contacts? No Other associated symptoms: Pain is better, she has not had a fever in 2 days. She went to urgent care and was told she needs to go to the emergency department for possible IV antibiotics.  She was waiting and did not stay to be seen. Therapies tried thus far: none  Past Medical History:  Diagnosis Date   Anxiety    Diabetes mellitus without complication (HCC)    Fibromyalgia    GERD (gastroesophageal reflux disease)    Hyperlipidemia    Hypertension    Osteoporosis    Thyroid disease     BP (!) 142/72   Pulse 84   Temp 98.6 F (37 C) (Oral)   Ht 5\' 1"  (1.549 m)   Wt 115 lb 4 oz (52.3 kg)   SpO2 95%   BMI 21.78 kg/m  Gen: awake, alert, appearing stated age Heart: RRR Lungs: CTAB. No accessory muscle use Skin: There is a 1.8 cm enlarged lymph node postauricularly on the right that is tender to palpation without fluctuance or erythema.  There are scattered lymph nodes over the right posterior scalp measuring less than 1 cm but they are also tender to palpation.  I did not appreciate any fluctuance, erythema, or drainage with these as well. Psych: Age appropriate judgment and insight  Lymphadenitis - Plan: predniSONE (DELTASONE) 20 MG tablet, amoxicillin-clavulanate (AUGMENTIN) 875-125 MG tablet  I think this is resolving on its own.  5-day prednisone burst 40 mg daily.  If no improvement in the next 2 days, will start a 10-day course of Augmentin.  If she is still having issues, will consider referral to ENT for possible biopsy and repeat labs. F/u prn. The patient and her spouse voiced understanding and agreement to the plan.  [place],  DO 05/13/21 12:13 PM

## 2021-05-23 ENCOUNTER — Other Ambulatory Visit: Payer: Self-pay | Admitting: Medical

## 2021-05-27 ENCOUNTER — Other Ambulatory Visit: Payer: Self-pay

## 2021-05-27 MED ORDER — AMLODIPINE BESYLATE 10 MG PO TABS: 10.0000 mg | ORAL_TABLET | Freq: Every day | ORAL | 2 refills | Status: DC

## 2021-05-27 MED ORDER — PANTOPRAZOLE SODIUM 40 MG PO TBEC: 40.0000 mg | DELAYED_RELEASE_TABLET | Freq: Every day | ORAL | 2 refills | Status: DC

## 2021-05-27 MED ORDER — FENOFIBRATE 160 MG PO TABS: 160.0000 mg | ORAL_TABLET | Freq: Every day | ORAL | 2 refills | Status: DC

## 2021-06-03 ENCOUNTER — Other Ambulatory Visit: Payer: Self-pay | Admitting: Medical

## 2021-06-15 ENCOUNTER — Other Ambulatory Visit: Payer: Self-pay | Admitting: Medical

## 2021-06-18 ENCOUNTER — Other Ambulatory Visit: Payer: Self-pay | Admitting: Medical

## 2021-07-02 ENCOUNTER — Other Ambulatory Visit: Payer: Self-pay | Admitting: Medical

## 2021-07-03 ENCOUNTER — Other Ambulatory Visit: Payer: Self-pay | Admitting: Medical

## 2021-07-05 MED ORDER — VENLAFAXINE HCL ER 75 MG PO CP24: 75.0000 mg | ORAL_CAPSULE | Freq: Every day | ORAL | 0 refills | Status: DC

## 2021-07-12 ENCOUNTER — Other Ambulatory Visit: Payer: Self-pay | Admitting: Endocrinology

## 2021-07-12 DIAGNOSIS — R5383 Other fatigue: Secondary | ICD-10-CM

## 2021-07-13 ENCOUNTER — Other Ambulatory Visit: Payer: Self-pay | Admitting: Medical

## 2021-07-13 IMAGING — CT CT HEAD W/O CM
3 of 4 series · 13 of 47 positions shown, 15 images · non-contrast
Comparison: None.

CLINICAL DATA: Headache.  Imbalance.

EXAM:
CT HEAD WITHOUT CONTRAST
TECHNIQUE: Contiguous axial images were obtained from the base of the skull
through the vertex without intravenous contrast.

[Series 2: head wo · axial · 0.44mm/px · z∈[+1002,+1122]mm · 7 of 33 slices shown, 9 images]
[im 5/33  brain]
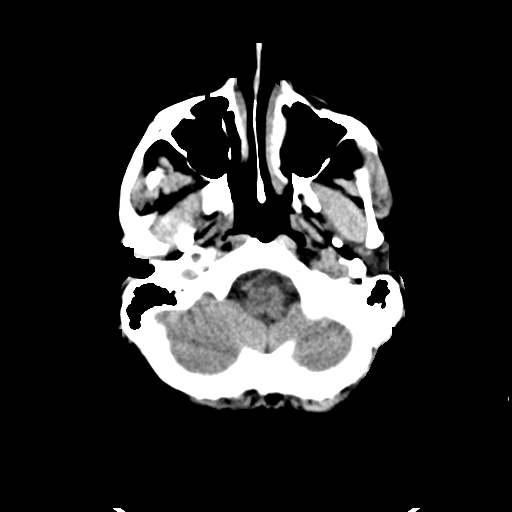
[im 5/33  bone]
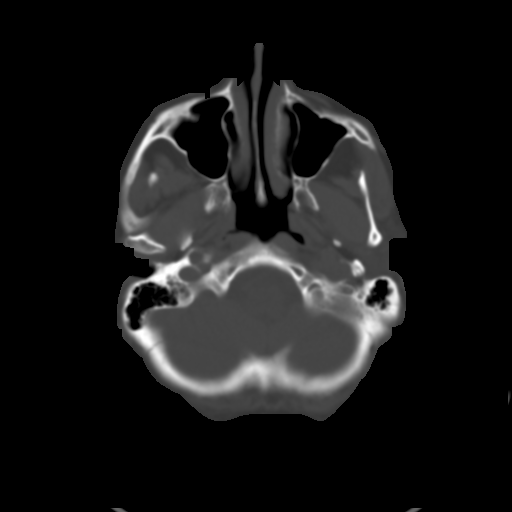
[im 9/33  brain]
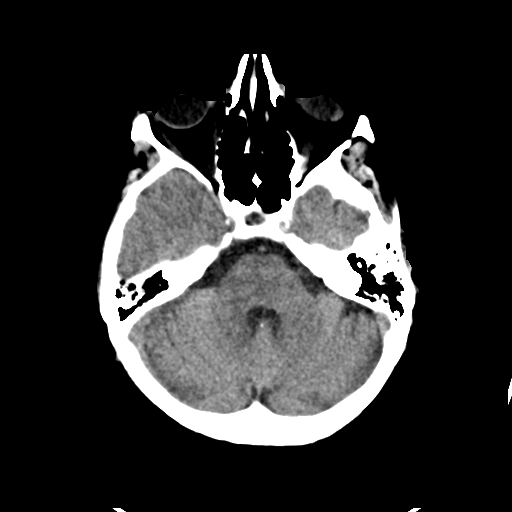
[im 13/33  brain]
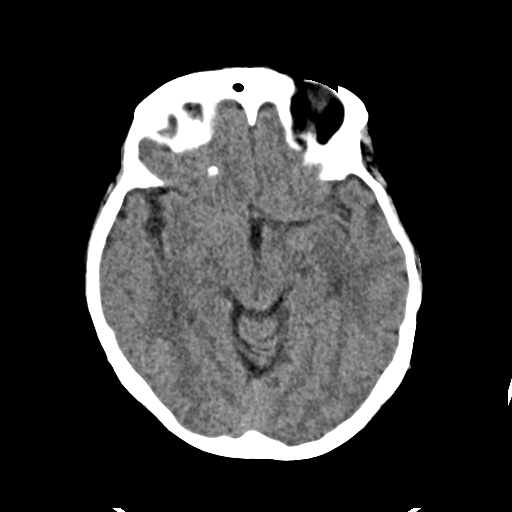
[im 17/33  brain]
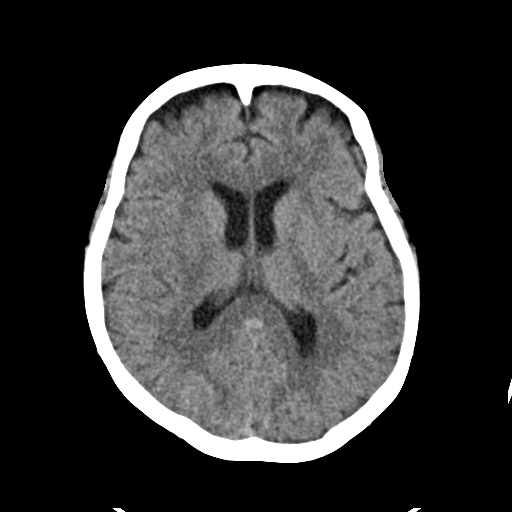
[im 21/33  brain]
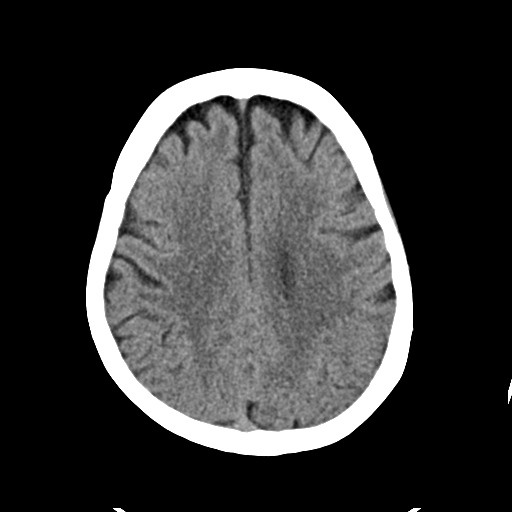
[im 21/33  bone]
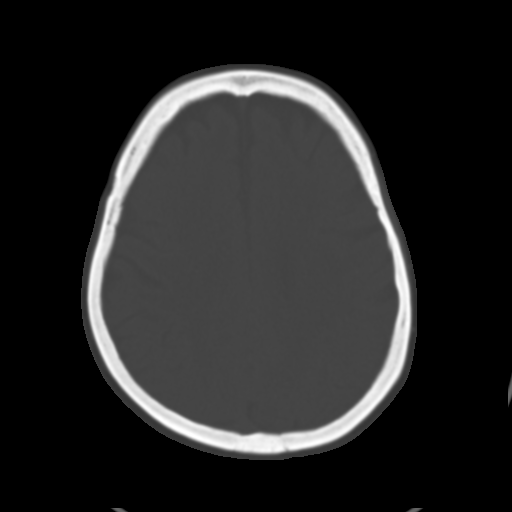
[im 25/33  brain]
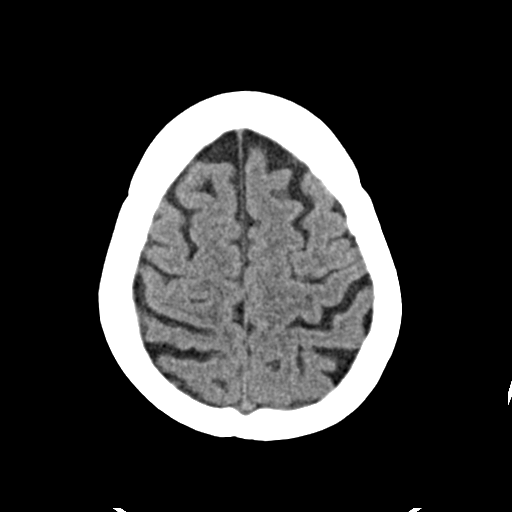
[im 29/33  brain]
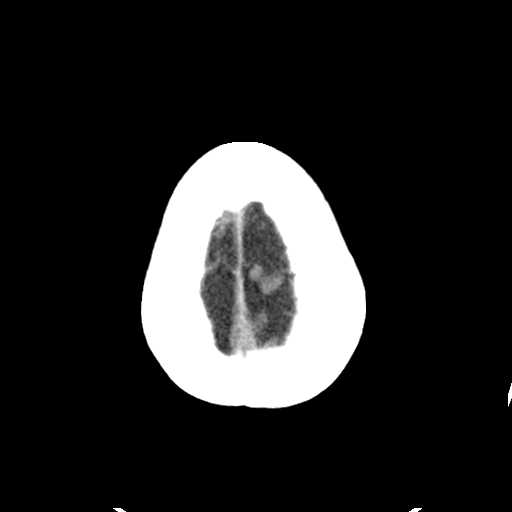

[Series 4: coronal soft · coronal · 0.32mm/px · 3 of 67 slices shown]
[im 23/67  brain]
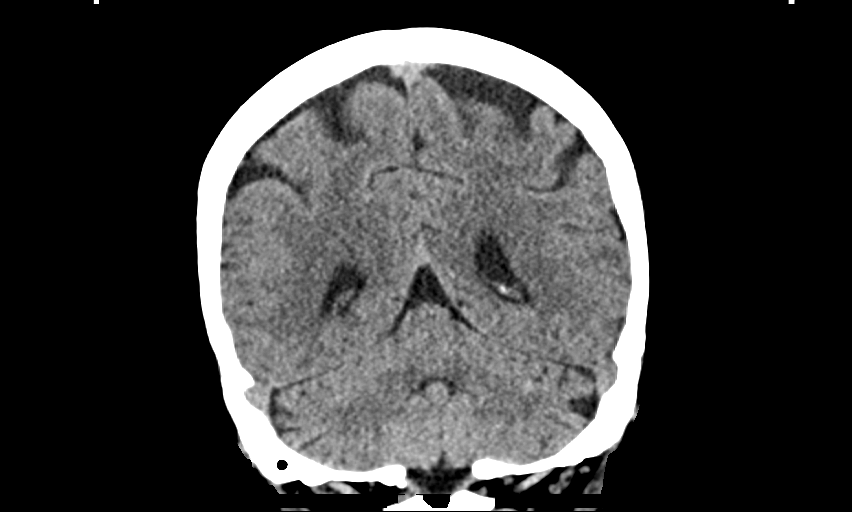
[im 30/67  brain]
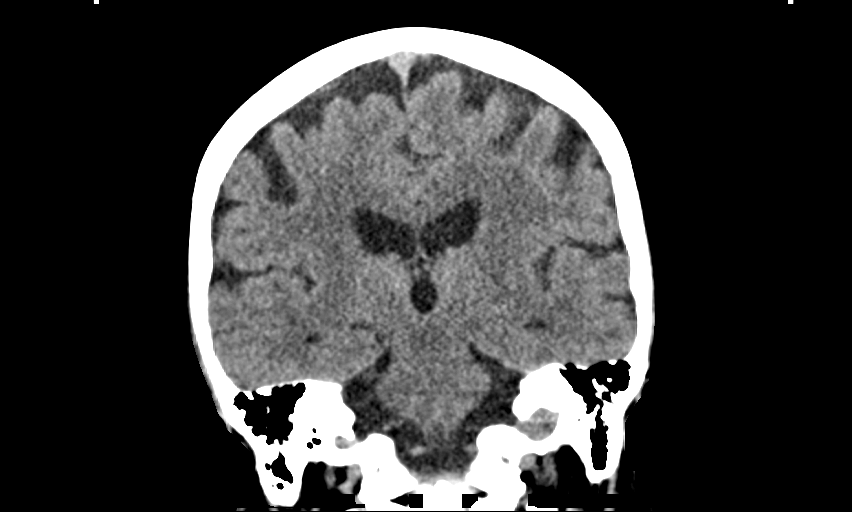
[im 37/67  brain]
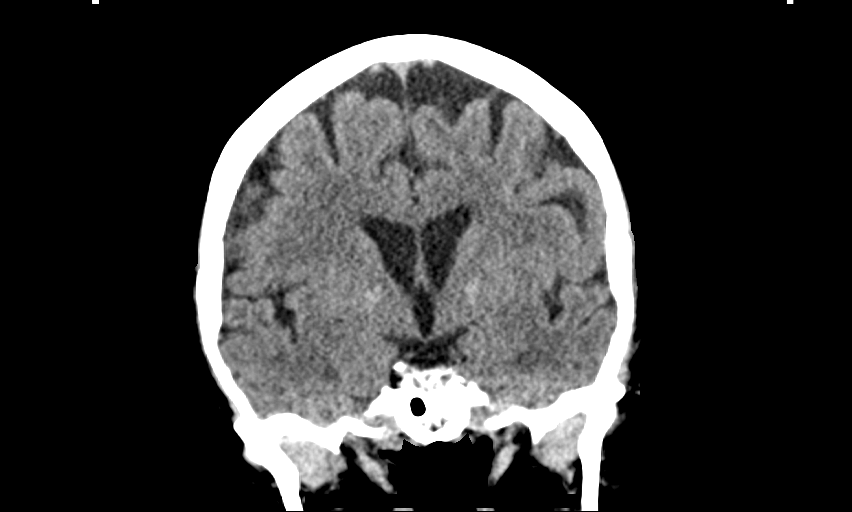

[Series 5: sag soft · sagittal · 0.32mm/px · 3 of 54 slices shown]
[im 18/54  brain]
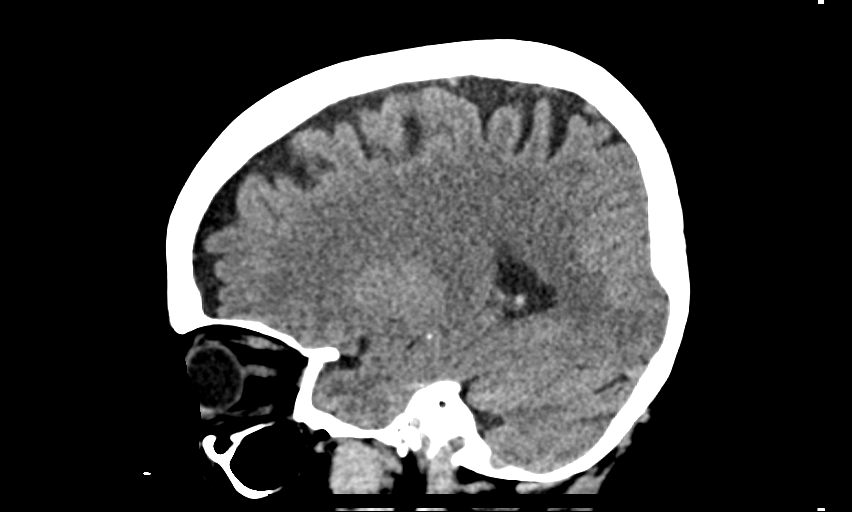
[im 27/54  brain]
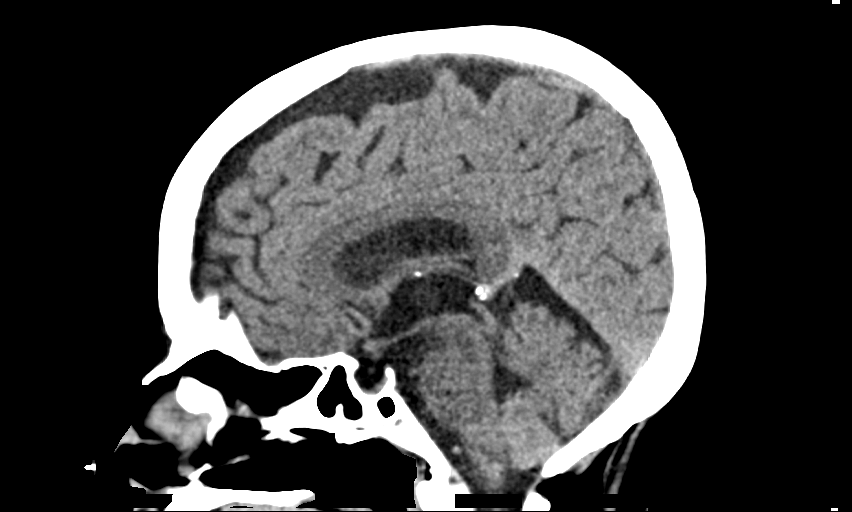
[im 36/54  brain]
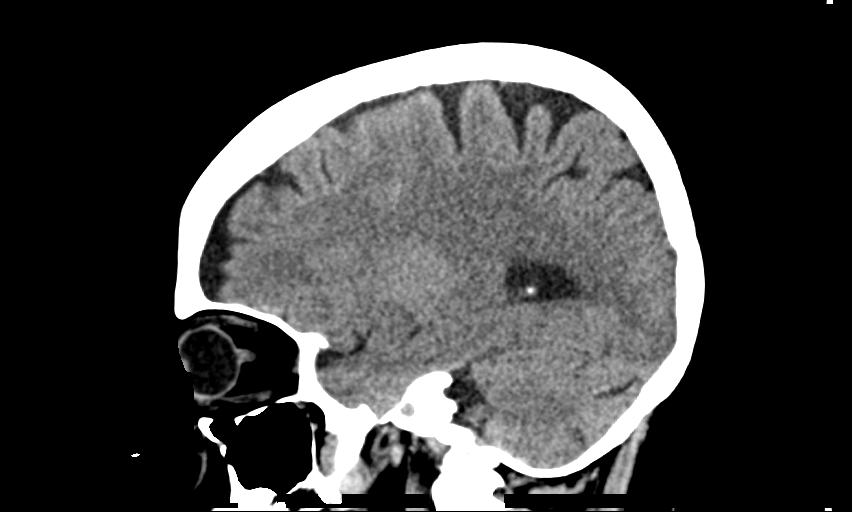

[13 of 47 positions shown; findings below may reference images not displayed]

FINDINGS: Brain: There is no evidence of an acute infarct, intracranial
hemorrhage, mass, midline shift, or extra-axial fluid collection.
The ventricles and sulci are within normal limits for age.

Vascular: Calcified atherosclerosis at the skull base. No hyperdense
vessel.

Skull: No fracture or suspicious osseous lesion.

Sinuses/Orbits: Paranasal sinuses and mastoid air cells are clear.
Unremarkable orbits.

Other: None.
IMPRESSION: Unremarkable CT appearance of the brain.

These results will be called to the ordering clinician or
representative by the [HOSPITAL] at the imaging location.

## 2021-07-14 NOTE — Addendum Note (Signed)
Addended by: Anabel Halon on: 07/14/2021 09:52 AM   Modules accepted: Orders

## 2021-07-14 NOTE — Telephone Encounter (Signed)
Would you like Pt to continue ergocalciferol?

## 2021-07-14 NOTE — Telephone Encounter (Signed)
Pt called and lvm to return call to schedule lab visit

## 2021-07-15 ENCOUNTER — Telehealth: Payer: Self-pay | Admitting: Medical

## 2021-07-15 DIAGNOSIS — R5383 Other fatigue: Secondary | ICD-10-CM

## 2021-07-15 MED ORDER — LEVOTHYROXINE SODIUM 25 MCG PO TABS
25.0000 ug | ORAL_TABLET | Freq: Every day | ORAL | 0 refills | Status: DC
Start: 2021-07-15 — End: 2021-10-07

## 2021-07-15 MED ORDER — GABAPENTIN 800 MG PO TABS: 800.0000 mg | ORAL_TABLET | Freq: Two times a day (BID) | ORAL | 2 refills | Status: DC

## 2021-07-15 NOTE — Telephone Encounter (Signed)
Pt called in regarding medication refill for gabapentin (NEURONTIN) 800 MG tablet.   [id number] [place], [address]  [address] [place] [id number] Pt is calling regarding medication refill for abapentin (NEURONTIN) 800 MG tablet.  And levothyroxine (SYNTHROID) 25 MCG tablet  Pt will be going out of town tomorrow morning 9/10.

## 2021-07-15 NOTE — Telephone Encounter (Signed)
Rx sent 

## 2021-07-20 ENCOUNTER — Other Ambulatory Visit: Payer: Self-pay | Admitting: Medical

## 2021-08-08 ENCOUNTER — Ambulatory Visit (INDEPENDENT_AMBULATORY_CARE_PROVIDER_SITE_OTHER): Payer: Medicare Other

## 2021-08-08 ENCOUNTER — Encounter: Payer: Self-pay | Admitting: Medical

## 2021-08-08 DIAGNOSIS — Z23 Encounter for immunization: Secondary | ICD-10-CM | POA: Diagnosis not present

## 2021-08-16 ENCOUNTER — Telehealth: Payer: Self-pay | Admitting: Medical

## 2021-08-16 MED ORDER — AMLODIPINE BESYLATE 10 MG PO TABS: 10.0000 mg | ORAL_TABLET | Freq: Every day | ORAL | 2 refills | Status: DC

## 2021-08-16 MED ORDER — FENOFIBRATE 160 MG PO TABS: 160.0000 mg | ORAL_TABLET | Freq: Every day | ORAL | 2 refills | Status: DC

## 2021-08-16 MED ORDER — METOPROLOL SUCCINATE ER 50 MG PO TB24: 50.0000 mg | ORAL_TABLET | Freq: Every day | ORAL | 1 refills | Status: DC

## 2021-08-16 MED ORDER — BUSPIRONE HCL 7.5 MG PO TABS: 7.5000 mg | ORAL_TABLET | Freq: Two times a day (BID) | ORAL | 1 refills | Status: DC

## 2021-08-16 MED ORDER — PANTOPRAZOLE SODIUM 40 MG PO TBEC: 40.0000 mg | DELAYED_RELEASE_TABLET | Freq: Every day | ORAL | 2 refills | Status: DC

## 2021-08-16 MED ORDER — GABAPENTIN 800 MG PO TABS: 800.0000 mg | ORAL_TABLET | Freq: Two times a day (BID) | ORAL | 2 refills | Status: DC

## 2021-08-16 NOTE — Telephone Encounter (Signed)
Rx sent 

## 2021-08-16 NOTE — Telephone Encounter (Signed)
Medication:  amLODipine (NORVASC) 10 MG tablet busPIRone (BUSPAR) 7.5 MG tablet  pantoprazole (PROTONIX) 40 MG tablet  gabapentin (NEURONTIN) 800 MG tablet fenofibrate 160 MG tablet  Has the patient contacted their pharmacy? No. (If no, request that the patient contact the pharmacy for the refill.) (If yes, when and what did the pharmacy advise?)  Preferred Pharmacy (with phone number or street name):  [id number] - [place], [address] [name] [address]  Phone:  [phone]  Fax:  [phone]

## 2021-08-18 ENCOUNTER — Ambulatory Visit (INDEPENDENT_AMBULATORY_CARE_PROVIDER_SITE_OTHER): Payer: Medicare Other | Admitting: Endocrinology

## 2021-08-18 ENCOUNTER — Other Ambulatory Visit: Payer: Self-pay

## 2021-08-18 ENCOUNTER — Encounter: Payer: Self-pay | Admitting: Endocrinology

## 2021-08-18 DIAGNOSIS — M85859 Other specified disorders of bone density and structure, unspecified thigh: Secondary | ICD-10-CM

## 2021-08-18 DIAGNOSIS — R5383 Other fatigue: Secondary | ICD-10-CM | POA: Diagnosis not present

## 2021-08-18 DIAGNOSIS — E559 Vitamin D deficiency, unspecified: Secondary | ICD-10-CM

## 2021-08-18 DIAGNOSIS — E039 Hypothyroidism, unspecified: Secondary | ICD-10-CM | POA: Diagnosis not present

## 2021-08-18 DIAGNOSIS — D509 Iron deficiency anemia, unspecified: Secondary | ICD-10-CM | POA: Diagnosis not present

## 2021-08-18 LAB — BASIC METABOLIC PANEL
BUN: 19 mg/dL (ref 6–23)
CO2: 24 mEq/L (ref 19–32)
Calcium: 9.6 mg/dL (ref 8.4–10.5)
Chloride: 103 mEq/L (ref 96–112)
Creatinine, Ser: 0.87 mg/dL (ref 0.40–1.20)
GFR: 68.27 mL/min (ref >60.00)
Glucose, Bld: 115 mg/dL — ABNORMAL HIGH (ref 70–99)
Potassium: 4.1 mEq/L (ref 3.5–5.1)
Sodium: 138 mEq/L (ref 135–145)

## 2021-08-18 LAB — IBC PANEL
Iron: 71 ug/dL (ref 42–145)
Saturation Ratios: 11.4 % — ABNORMAL LOW (ref 20.0–50.0)
TIBC: 623 ug/dL — ABNORMAL HIGH (ref 250.0–450.0)
Transferrin: 445 mg/dL — ABNORMAL HIGH (ref 212.0–360.0)

## 2021-08-18 LAB — T4, FREE: Free T4: 0.96 ng/dL (ref 0.60–1.60)

## 2021-08-18 LAB — VITAMIN D 25 HYDROXY (VIT D DEFICIENCY, FRACTURES): VITD: 42 ng/mL (ref 30.00–100.00)

## 2021-08-18 LAB — TSH: TSH: 4.24 u[IU]/mL (ref 0.35–5.50)

## 2021-08-18 NOTE — Progress Notes (Signed)
Patient ID: Jasmine Keith, female   DOB: 1951-04-21, 70 y.o.   MRN: [id number]            Chief complaint: Endocrinology follow-up  History of Present Illness:   HYPERCALCEMIA: Her current ecords show that she has had a high calcium since at least 12/2018 At that time it was slightly above normal at 10.5 She also had a 11.0 calcium on 06/26/2019  Highest level is 11.2 done on 04/09/2020, at the same time albumin is 4.9 Recent calcium levels have been normal  Lab Results  Component Value Date   CALCIUM 9.5 05/11/2021   CALCIUM 9.9 03/14/2021   CALCIUM 10.7 (H) 03/02/2021   CALCIUM 10.5 01/03/2021   CALCIUM 10.0 12/02/2020   CALCIUM 10.8 (H) 09/22/2020   CALCIUM 10.7 (H) 05/31/2020    The hypercalcemia is not associated with any history of fractures or kidney stones  She does have OSTEOPENIA For this she was started on Fosamax 70 mg weekly in 06/2020 Taking this regularly as directed without side effects  She did not take any calcium supplements  She was recommended OTC vitamin D but his PCP had prescribed 50,000 units vitamin D weekly for 2 months in June Currently not taking any supplements  PTH levels have been fluctuating between 32 and 45  Lab Results  Component Value Date   PTH 45 05/31/2020   PTH Comment 05/31/2020   CALCIUM 9.5 05/11/2021   CAION 5.8 (H) 05/31/2020      25 (OH) Vitamin D level:  Lab Results  Component Value Date   VD25OH 29.84 (L) 12/02/2020    BONE density results:  Results:  06/23/2020 Lumbar spine L1-L4 Femoral neck (FN) 33% left distal radius  T-score -0.3 RFN: -2.2 LFN: -2.3      Allergies as of 08/18/2021   No Known Allergies      Medication List        Accurate as of August 18, 2021  1:10 PM. If you have any questions, ask your nurse or doctor.          alendronate 70 MG tablet Commonly known as: FOSAMAX TAKE 1 TABLET BY MOUTH ONCE A WEEK. TAKE WITH A FULL GLASS OF WATER ON AN EMPTY STOMACH.   amLODipine  10 MG tablet Commonly known as: NORVASC Take 1 tablet (10 mg total) by mouth daily.   amoxicillin-clavulanate 875-125 MG tablet Commonly known as: AUGMENTIN Take 1 tablet by mouth 2 (two) times daily.   BD Pen Needle Nano U/F 32G X 4 MM Misc Generic drug: Insulin Pen Needle Use one needle daily to inject insulin   busPIRone 7.5 MG tablet Commonly known as: BUSPAR Take 1 tablet (7.5 mg total) by mouth 2 (two) times daily.   dicyclomine 10 MG capsule Commonly known as: BENTYL TAKE 1 CAPSULE (10 MG TOTAL) BY MOUTH 3 (THREE) TIMES DAILY BEFORE MEALS.   fenofibrate 160 MG tablet Take 1 tablet (160 mg total) by mouth daily.   gabapentin 800 MG tablet Commonly known as: NEURONTIN Take 1 tablet (800 mg total) by mouth 2 (two) times daily.   Levemir FlexTouch 100 UNIT/ML FlexPen Generic drug: insulin detemir INJECT 36 UNITS DAILY AT NIGHT   levothyroxine 25 MCG tablet Commonly known as: SYNTHROID Take 1 tablet (25 mcg total) by mouth daily before breakfast.   losartan 100 MG tablet Commonly known as: COZAAR Take 1 tablet (100 mg total) by mouth daily.   meclizine 25 MG tablet Commonly known as: ANTIVERT Take 1  tablet (25 mg total) by mouth 3 (three) times daily as needed for dizziness.   meloxicam 7.5 MG tablet Commonly known as: MOBIC Take 7.5 mg by mouth daily as needed.   metFORMIN 1000 MG tablet Commonly known as: GLUCOPHAGE Take 1 tablet (1,000 mg total) by mouth 2 (two) times daily with a meal.   metoprolol succinate 50 MG 24 hr tablet Commonly known as: TOPROL-XL Take 1 tablet (50 mg total) by mouth daily. Take with or immediately following a meal.   pantoprazole 40 MG tablet Commonly known as: PROTONIX Take 1 tablet (40 mg total) by mouth daily.   venlafaxine XR 75 MG 24 hr capsule Commonly known as: EFFEXOR-XR Take 1 capsule (75 mg total) by mouth daily with breakfast.   Vitamin D (Ergocalciferol) 1.25 MG (50000 UNIT) Caps capsule Commonly known as:  DRISDOL TAKE 1 CAPSULE (50,000 UNITS TOTAL) BY MOUTH EVERY 7 (SEVEN) DAYS        Allergies: No Known Allergies  Past Medical History:  Diagnosis Date   Anxiety    Diabetes mellitus without complication (HCC)    Fibromyalgia    GERD (gastroesophageal reflux disease)    Hyperlipidemia    Hypertension    Osteoporosis    Thyroid disease     Past Surgical History:  Procedure Laterality Date   coronary stent placed  1993    Family History  Problem Relation Age of Onset   Diabetes Mother    Kidney disease Mother    Diabetes Father    Cancer Sister        lung   Kidney disease Sister    Thyroid disease Neg Hx    Colon cancer Neg Hx    Esophageal cancer Neg Hx    Rectal cancer Neg Hx    Stomach cancer Neg Hx     Social History:  reports that she has been smoking cigarettes. She has been smoking an average of .5 packs per day. She has never used smokeless tobacco. She reports current alcohol use. She reports that she does not currently use drugs.  Review of Systems  She is being managed by her PCP in follow-up for diabetes on Levemir and Metformin No recent A1c available  Lab Results  Component Value Date   HGBA1C 6.9 (H) 04/05/2021   HGBA1C 6.9 (H) 01/03/2021   HGBA1C 6.7 (H) 09/22/2020   Lab Results  Component Value Date   CREATININE 0.97 05/11/2021   FATIGUE: Continues to complain of significant fatigue has not been evaluated by PCP Has mild anemia  HYPOTHYROIDISM: She was tried on levothyroxine 25 mcg daily in 2/22 when TSH was trending higher However she does not think she felt better with the supplement and is still tired  Lab Results  Component Value Date   TSH 3.90 03/14/2021   TSH 6.85 (H) 01/03/2021   TSH 4.72 (H) 12/02/2020   FREET4 0.88 03/14/2021   FREET4 0.87 01/03/2021   FREET4 0.88 12/02/2020     EXAM:  BP 140/70   Pulse (!) 59   Ht 5\' 1"  (1.549 m)   Wt 113 lb (51.3 kg)   SpO2 99%   BMI 21.35 kg/m   Thyroid not palpable No  tremor Biceps reflexes difficult to elicit  Assessment/Plan:   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:  She has had mild chronic hypercalcemia with recent levels back to normal  OSTEOPENIA: Baseline T score is -2.2 at the hip  As before she is on Fosamax prophylactically because of associated hyperparathyroidism No  side effects with this and she is taking it as directed regularly  History of vitamin D deficiency: Currently not on supplements and will need follow-up labs  DIABETES: She is being followed by PCP and needs to follow-up with him, needs follow-up appointment  FATIGUE: We will rule out iron deficiency, she does have persistent mild anemia  ABNORMAL TSH levels likely has only subclinical hypothyroidism as she does not seem to benefit from trial of 25 mg levothyroxine Will recheck levels today   Elayne Snare 08/18/2021, 1:10 PM   addendum: Her labs show calcium of 9.6, vitamin D of 42 Iron saturation 11% and needs to be evaluated by PCP Since TSH is 4.2 she will leave off her levothyroxine as unlikely she is benefiting from supplementation  Elayne Snare

## 2021-08-20 ENCOUNTER — Other Ambulatory Visit: Payer: Self-pay | Admitting: Medical

## 2021-09-23 ENCOUNTER — Other Ambulatory Visit: Payer: Self-pay

## 2021-09-23 ENCOUNTER — Encounter: Payer: Self-pay | Admitting: Medical

## 2021-09-23 ENCOUNTER — Ambulatory Visit (INDEPENDENT_AMBULATORY_CARE_PROVIDER_SITE_OTHER): Payer: Medicare Other | Admitting: Medical

## 2021-09-23 VITALS — BP 165/85 | HR 65 | Resp 18 | Ht 61.0 in | Wt 114.0 lb

## 2021-09-23 DIAGNOSIS — I10 Essential (primary) hypertension: Secondary | ICD-10-CM | POA: Diagnosis not present

## 2021-09-23 DIAGNOSIS — L089 Local infection of the skin and subcutaneous tissue, unspecified: Secondary | ICD-10-CM | POA: Diagnosis not present

## 2021-09-23 DIAGNOSIS — E119 Type 2 diabetes mellitus without complications: Secondary | ICD-10-CM | POA: Diagnosis not present

## 2021-09-23 DIAGNOSIS — R591 Generalized enlarged lymph nodes: Secondary | ICD-10-CM | POA: Diagnosis not present

## 2021-09-23 DIAGNOSIS — D649 Anemia, unspecified: Secondary | ICD-10-CM

## 2021-09-23 MED ORDER — LEVEMIR FLEXTOUCH 100 UNIT/ML ~~LOC~~ SOPN: PEN_INJECTOR | SUBCUTANEOUS | 3 refills | Status: DC

## 2021-09-23 MED ORDER — DOXYCYCLINE HYCLATE 100 MG PO TABS: 100.0000 mg | ORAL_TABLET | Freq: Two times a day (BID) | ORAL | 0 refills | Status: DC

## 2021-09-23 MED ORDER — HYDRALAZINE HCL 25 MG PO TABS: 25.0000 mg | ORAL_TABLET | Freq: Three times a day (TID) | ORAL | 0 refills | Status: DC

## 2021-09-23 NOTE — Progress Notes (Signed)
Subjective:    Patient ID: Jasmine Keith, female    DOB: [birth year], 70 y.o.   MRN: [id number]  HPI Area under her rt arm is swollen, red and draining. This started about one week ago. She states area burst and she got creamy yellow discharge from the area. She applied peroxide to area. Pt states drained for about 2 days.   No fever, no chills or sweats.  Pt has historry of lymphadenitis behind both ears. Dr.Wendling exam back in summer.   "Skin: There is a 1.8 cm enlarged lymph node postauricularly on the right that is tender to palpation without fluctuance or erythema.  There are scattered lymph nodes over the right posterior scalp measuring less than 1 cm but they are also tender to palpation.  I did not appreciate any fluctuance, erythema, or drainage with these as well. Psych: Age appropriate judgment and insight"  Dx.  Lymphadenitis - Plan: predniSONE (DELTASONE) 20 MG tablet, amoxicillin-clavulanate (AUGMENTIN) 875-125 MG tablet   I think this is resolving on its own.  5-day prednisone burst 40 mg daily.  If no improvement in the next 2 days, will start a 10-day course of Augmentin.  If she is still having issues, will consider referral to ENT for possible biopsy and repeat labs. F/u prn. The patient and her spouse voiced understanding and agreement to the plan.   High bp readings at home 767-341 systolic at home.  Pt is on amlodipine, losartan and b- blocker.   Review of Systems  Constitutional:  Negative for chills, fatigue and fever.  HENT:  Negative for congestion and drooling.   Respiratory:  Negative for cough, chest tightness, shortness of breath and wheezing.   Cardiovascular:  Negative for chest pain and palpitations.  Gastrointestinal:  Negative for abdominal distention, abdominal pain and blood in stool.  Musculoskeletal:  Negative for back pain.  Skin:        Skin infection.  Hematological:  Negative for adenopathy. Does not bruise/bleed easily.   Psychiatric/Behavioral:  Negative for behavioral problems, confusion and sleep disturbance. The patient is not nervous/anxious.       Objective:   Physical Exam  General- No acute distress. Pleasant patient. Neck- Full range of motion, no jvd Lungs- Clear, even and unlabored. Heart- regular rate and rhythm. Neurologic- CNII- XII grossly intact. No gross motor or sensory function deficits. Left axillary area- mild small pea sized area of induration. No dc, no warmth but mild tender. Heent- no sinus pressure. Rt side posterior cervical lymph node mild palpable notes. Behind rt ear sligth palpable enlarged lymph node. Rt ear- normal tm. Periauricular skin and on neck normal.           Assessment & Plan:    Patient Instructions  Skin infection presently. Prior description of abscess but looks like you have gotten a lot better. Considered getting culture but no discharge to culture.  At the present time considering low-level skin infection that I expect will get better with use doxycycline antibiotic.  Hx of persistent mild swollen lymph nodes  rt side neck and behind rt ear. This has been case since summer. Areas better but not completely. Decided to go ahead and refer to ENT.  Diabetes-last A1c was well controlled.  Refilling your insulin today and getting A1c along with CMP.  Hypertension-blood pressures are not controlled recently.  No cardiac or neurologic signs or symptoms.  Continue current 3 blood pressure med regimen and adding on hydralazine 25 mg 3 times daily.  If you have any cardiac or neurologic signs or symptoms recommend ED evaluation.   Follow-up in 10 days or sooner if needed.   Mackie Pai, PA-C

## 2021-09-23 NOTE — Patient Instructions (Addendum)
Skin infection presently. Prior description of abscess but looks like you have gotten a lot better. Considered getting culture but no discharge to culture.  At the present time considering low-level skin infection that I expect will get better with use doxycycline antibiotic.  Hx of persistent mild swollen lymph nodes  rt side neck and behind rt ear. This has been case since summer. Areas better but not completely. Decided to go ahead and refer to ENT.  Diabetes-last A1c was well controlled.  Refilling your insulin today and getting A1c along with CMP.  Hypertension-blood pressures are not controlled recently.  No cardiac or neurologic signs or symptoms.  Continue current 3 blood pressure med regimen and adding on hydralazine 25 mg 3 times daily.  If you have any cardiac or neurologic signs or symptoms recommend ED evaluation.   Follow-up in 10 days or sooner if needed.

## 2021-09-24 LAB — CBC WITH DIFFERENTIAL/PLATELET
Absolute Monocytes: 435 cells/uL (ref 200–950)
Basophils Absolute: 63 cells/uL (ref 0–200)
Basophils Relative: 0.8 %
Eosinophils Absolute: 79 cells/uL (ref 15–500)
Eosinophils Relative: 1 %
HCT: 33.7 % — ABNORMAL LOW (ref 35.0–45.0)
Hemoglobin: 10.9 g/dL — ABNORMAL LOW (ref 11.7–15.5)
Lymphs Abs: 2425 cells/uL (ref 850–3900)
MCH: 26.6 pg — ABNORMAL LOW (ref 27.0–33.0)
MCHC: 32.3 g/dL (ref 32.0–36.0)
MCV: 82.2 fL (ref 80.0–100.0)
MPV: 11.1 fL (ref 7.5–12.5)
Monocytes Relative: 5.5 %
Neutro Abs: 4898 cells/uL (ref 1500–7800)
Neutrophils Relative %: 62 %
Platelets: 396 10*3/uL (ref 140–400)
RBC: 4.1 10*6/uL (ref 3.80–5.10)
RDW: 14.5 % (ref 11.0–15.0)
Total Lymphocyte: 30.7 %
WBC: 7.9 10*3/uL (ref 3.8–10.8)

## 2021-09-24 LAB — HEMOGLOBIN A1C
Hgb A1c MFr Bld: 6.7 % of total Hgb — ABNORMAL HIGH (ref <5.7)
Mean Plasma Glucose: 146 mg/dL
eAG (mmol/L): 8.1 mmol/L

## 2021-09-24 LAB — COMPREHENSIVE METABOLIC PANEL
AG Ratio: 1.7 (calc) (ref 1.0–2.5)
ALT: 13 U/L (ref 6–29)
AST: 19 U/L (ref 10–35)
Albumin: 4.2 g/dL (ref 3.6–5.1)
Alkaline phosphatase (APISO): 39 U/L (ref 37–153)
BUN: 14 mg/dL (ref 7–25)
CO2: 23 mmol/L (ref 20–32)
Calcium: 9.9 mg/dL (ref 8.6–10.4)
Chloride: 108 mmol/L (ref 98–110)
Creat: 0.79 mg/dL (ref 0.50–1.05)
Globulin: 2.5 g/dL (calc) (ref 1.9–3.7)
Glucose, Bld: 96 mg/dL (ref 65–99)
Potassium: 4.3 mmol/L (ref 3.5–5.3)
Sodium: 141 mmol/L (ref 135–146)
Total Bilirubin: 0.3 mg/dL (ref 0.2–1.2)
Total Protein: 6.7 g/dL (ref 6.1–8.1)

## 2021-09-24 NOTE — Addendum Note (Signed)
Addended by: Anabel Halon on: 09/24/2021 01:34 PM   Modules accepted: Orders

## 2021-10-04 ENCOUNTER — Ambulatory Visit (INDEPENDENT_AMBULATORY_CARE_PROVIDER_SITE_OTHER): Payer: Medicare Other | Admitting: Medical

## 2021-10-04 ENCOUNTER — Encounter: Payer: Self-pay | Admitting: Medical

## 2021-10-04 ENCOUNTER — Other Ambulatory Visit: Payer: Self-pay

## 2021-10-04 VITALS — BP 157/80 | HR 78 | Resp 18 | Ht 61.0 in | Wt 113.6 lb

## 2021-10-04 DIAGNOSIS — L732 Hidradenitis suppurativa: Secondary | ICD-10-CM

## 2021-10-04 DIAGNOSIS — R591 Generalized enlarged lymph nodes: Secondary | ICD-10-CM

## 2021-10-04 DIAGNOSIS — L089 Local infection of the skin and subcutaneous tissue, unspecified: Secondary | ICD-10-CM | POA: Diagnosis not present

## 2021-10-04 DIAGNOSIS — R519 Headache, unspecified: Secondary | ICD-10-CM

## 2021-10-04 DIAGNOSIS — I10 Essential (primary) hypertension: Secondary | ICD-10-CM | POA: Diagnosis not present

## 2021-10-04 DIAGNOSIS — E559 Vitamin D deficiency, unspecified: Secondary | ICD-10-CM

## 2021-10-04 DIAGNOSIS — D649 Anemia, unspecified: Secondary | ICD-10-CM

## 2021-10-04 DIAGNOSIS — R5383 Other fatigue: Secondary | ICD-10-CM

## 2021-10-04 LAB — CBC WITH DIFFERENTIAL/PLATELET
Basophils Absolute: 0.1 10*3/uL (ref 0.0–0.1)
Basophils Relative: 1.2 % (ref 0.0–3.0)
Eosinophils Absolute: 0 10*3/uL (ref 0.0–0.7)
Eosinophils Relative: 0.5 % (ref 0.0–5.0)
HCT: 34.2 % — ABNORMAL LOW (ref 36.0–46.0)
Hemoglobin: 11.2 g/dL — ABNORMAL LOW (ref 12.0–15.0)
Lymphocytes Relative: 17.5 % (ref 12.0–46.0)
Lymphs Abs: 1.7 10*3/uL (ref 0.7–4.0)
MCHC: 32.6 g/dL (ref 30.0–36.0)
MCV: 81.9 fl (ref 78.0–100.0)
Monocytes Absolute: 0.4 10*3/uL (ref 0.1–1.0)
Monocytes Relative: 3.9 % (ref 3.0–12.0)
Neutro Abs: 7.3 10*3/uL (ref 1.4–7.7)
Neutrophils Relative %: 76.9 % (ref 43.0–77.0)
Platelets: 362 10*3/uL (ref 150.0–400.0)
RBC: 4.18 Mil/uL (ref 3.87–5.11)
RDW: 14.6 % (ref 11.5–15.5)
WBC: 9.5 10*3/uL (ref 4.0–10.5)

## 2021-10-04 LAB — IRON: Iron: 69 ug/dL (ref 42–145)

## 2021-10-04 MED ORDER — AMOXICILLIN-POT CLAVULANATE 875-125 MG PO TABS: 1.0000 | ORAL_TABLET | Freq: Two times a day (BID) | ORAL | 0 refills | Status: DC

## 2021-10-04 NOTE — Progress Notes (Signed)
Subjective:    Patient ID: Jasmine Keith, female    DOB: Dec 01, 1950, 70 y.o.   MRN: [id number]  HPI Pt in for follow up on left axillary area skin infection. She took 3 days of doxycycline and then had to stop due to stomach being upset. She states did not take on empty stomach but still had symptoms.   Pt describes that antibiotic caused reflux type symptoms.  Pt also has diabetes. Last a1c was 6.8. no tx to diabetic regimen.   Pt has lymph node enlarged behind her rt ear. She states has sore spots rt side scalp and  rt occipital area lymph node enlarged.  Referred to [facility].    Sent to [facility].   [address]   [address], [place] [address] [phone]  Htn- she stopped her newest bp medication hydralazine  on same day she stopped doxy.     Review of Systems  Constitutional:  Negative for chills, fatigue and fever.  Respiratory:  Negative for cough, chest tightness, shortness of breath and wheezing.   Cardiovascular:  Negative for chest pain and palpitations.  Gastrointestinal:  Negative for abdominal pain.  Musculoskeletal:  Negative for back pain.  Hematological:  Positive for adenopathy. Does not bruise/bleed easily.  Psychiatric/Behavioral:  Negative for suicidal ideas. The patient is not nervous/anxious.    Past Medical History:  Diagnosis Date   Anxiety    Diabetes mellitus without complication (HCC)    Fibromyalgia    GERD (gastroesophageal reflux disease)    Hyperlipidemia    Hypertension    Osteoporosis    Thyroid disease      Social History   Socioeconomic History   Marital status: Married    Spouse name: Not on file   Number of children: Not on file   Years of education: Not on file   Highest education level: Not on file  Occupational History   Not on file  Tobacco Use   Smoking status: Every Day    Packs/day: 0.50    Types: Cigarettes   Smokeless tobacco: Never  Vaping Use   Vaping Use: Never used   Substance and Sexual Activity   Alcohol use: Yes    Comment: Rare   Drug use: Not Currently   Sexual activity: Not on file  Other Topics Concern   Not on file  Social History Narrative   Not on file   Social Determinants of Health   Financial Resource Strain: Not on file  Food Insecurity: Not on file  Transportation Needs: Not on file  Physical Activity: Not on file  Stress: Not on file  Social Connections: Not on file  Intimate Partner Violence: Not on file    Past Surgical History:  Procedure Laterality Date   coronary stent placed  1993    Family History  Problem Relation Age of Onset   Diabetes Mother    Kidney disease Mother    Diabetes Father    Cancer Sister        lung   Kidney disease Sister    Thyroid disease Neg Hx    Colon cancer Neg Hx    Esophageal cancer Neg Hx    Rectal cancer Neg Hx    Stomach cancer Neg Hx     No Known Allergies  Current Outpatient Medications on File Prior to Visit  Medication Sig Dispense Refill   alendronate (FOSAMAX) 70 MG tablet TAKE 1 TABLET BY MOUTH ONCE A WEEK. TAKE  WITH A FULL GLASS OF WATER ON AN EMPTY STOMACH. 12 tablet 3   amLODipine (NORVASC) 10 MG tablet Take 1 tablet (10 mg total) by mouth daily. 30 tablet 2   busPIRone (BUSPAR) 7.5 MG tablet Take 1 tablet (7.5 mg total) by mouth 2 (two) times daily. 180 tablet 1   dicyclomine (BENTYL) 10 MG capsule TAKE 1 CAPSULE (10 MG TOTAL) BY MOUTH 3 (THREE) TIMES DAILY BEFORE MEALS. 90 capsule 0   doxycycline (VIBRA-TABS) 100 MG tablet Take 1 tablet (100 mg total) by mouth 2 (two) times daily. Can give capsules or generic. 20 tablet 0   fenofibrate 160 MG tablet Take 1 tablet (160 mg total) by mouth daily. 30 tablet 2   gabapentin (NEURONTIN) 800 MG tablet Take 1 tablet (800 mg total) by mouth 2 (two) times daily. 60 tablet 2   hydrALAZINE (APRESOLINE) 25 MG tablet Take 1 tablet (25 mg total) by mouth 3 (three) times daily. 90 tablet 0   insulin detemir (LEVEMIR FLEXTOUCH)  100 UNIT/ML FlexPen INJECT 36 UNITS DAILY AT NIGHT 3 mL 3   Insulin Pen Needle (BD PEN NEEDLE NANO U/F) 32G X 4 MM MISC Use one needle daily to inject insulin 30 each 5   levothyroxine (SYNTHROID) 25 MCG tablet Take 1 tablet (25 mcg total) by mouth daily before breakfast. 90 tablet 0   losartan (COZAAR) 100 MG tablet Take 1 tablet (100 mg total) by mouth daily. 90 tablet 3   meclizine (ANTIVERT) 25 MG tablet Take 1 tablet (25 mg total) by mouth 3 (three) times daily as needed for dizziness. 30 tablet 0   meloxicam (MOBIC) 7.5 MG tablet Take 7.5 mg by mouth daily as needed.     metFORMIN (GLUCOPHAGE) 1000 MG tablet Take 1 tablet (1,000 mg total) by mouth 2 (two) times daily with a meal. 180 tablet 3   metoprolol succinate (TOPROL-XL) 50 MG 24 hr tablet Take 1 tablet (50 mg total) by mouth daily. Take with or immediately following a meal. 90 tablet 1   pantoprazole (PROTONIX) 40 MG tablet Take 1 tablet (40 mg total) by mouth daily. 30 tablet 2   venlafaxine XR (EFFEXOR-XR) 75 MG 24 hr capsule Take 1 capsule (75 mg total) by mouth daily with breakfast. 30 capsule 0   Vitamin D, Ergocalciferol, (DRISDOL) 1.25 MG (50000 UNIT) CAPS capsule TAKE 1 CAPSULE (50,000 UNITS TOTAL) BY MOUTH EVERY 7 (SEVEN) DAYS 8 capsule 0   Current Facility-Administered Medications on File Prior to Visit  Medication Dose Route Frequency Provider Last Rate Last Admin   0.9 %  sodium chloride infusion  500 mL Intravenous Once Jackquline Denmark, MD        BP (!) 164/80   Pulse 78   Resp 18   Ht 5\' 1"  (1.549 m)   Wt 113 lb 9.6 oz (51.5 kg)   SpO2 98%   BMI 21.46 kg/m       Objective:   Physical Exam  General Mental Status- Alert. General Appearance- Not in acute distress.   Skin Scalp tenderness rt side parietal area. Mild pinkish redness.  Neck Carotid Arteries- Normal color. Moisture- Normal Moisture. No carotid bruits. No JVD.  Chest and Lung Exam Auscultation: Breath  Sounds:-Normal.  Cardiovascular Auscultation:Rythm- Regular. Murmurs & Other Heart Sounds:Auscultation of the heart reveals- No Murmurs.  Abdomen Inspection:-Inspeection Normal. Palpation/Percussion:Note:No mass. Palpation and Percussion of the abdomen reveal- Non Tender, Non Distended + BS, no rebound or guarding.    NeurologicCranial Nerve exam:  CN  III-XII intact(No nystagmus), symmetric smile. Strength:- 5/5 equal and symmetric strength both upper and lower extremities.       Assessment & Plan:   Patient Instructions  Resolved hydradenitis left side with brief course of doxy. But could not tolerate gi side effect.  Scalp tenderness with redness pink color. Rx augmentin. Number to ent to evaluate the rt side lymph node behind ear is [phone].  Htn- bp is elevated today but you have not been on hydralazine. Restarted today. Call with bp update or bring in numbers when repeat lab work Friday or Monday.  For anemia recently worse get cbc, iron level and ifob.  Follow up 1 week or sooner if needed.      Mackie Pai, PA-C

## 2021-10-04 NOTE — Patient Instructions (Addendum)
Resolved hydradenitis left side with brief course of doxy. But could not tolerate gi side effect.  Scalp tenderness with redness pink color. Rx augmentin. Number to ent to evaluate the rt side lymph node behind ear is [phone].  Htn- bp is elevated today but you have not been on hydralazine. Restarted today. Call with bp update or bring in numbers when repeat lab work Friday or Monday.  For anemia recently worse get cbc, iron level and ifob.  Follow up 1 week or sooner if needed.

## 2021-10-07 ENCOUNTER — Other Ambulatory Visit: Payer: Self-pay | Admitting: Endocrinology

## 2021-10-07 ENCOUNTER — Telehealth: Payer: Self-pay | Admitting: Medical

## 2021-10-07 DIAGNOSIS — R5383 Other fatigue: Secondary | ICD-10-CM

## 2021-10-07 DIAGNOSIS — E559 Vitamin D deficiency, unspecified: Secondary | ICD-10-CM

## 2021-10-07 MED ORDER — VITAMIN D (ERGOCALCIFEROL) 1.25 MG (50000 UNIT) PO CAPS: 50000.0000 [IU] | ORAL_CAPSULE | ORAL | 0 refills | Status: DC

## 2021-10-07 NOTE — Addendum Note (Signed)
Addended by: Anabel Halon on: 10/07/2021 02:18 PM   Modules accepted: Orders

## 2021-10-07 NOTE — Telephone Encounter (Signed)
Chart opened to rx med, order lab, review chart, respond to my chart message or send message to staff member  

## 2021-10-08 LAB — VITAMIN B1: Vitamin B1 (Thiamine): 9 nmol/L (ref 8–30)

## 2021-10-08 LAB — VITAMIN D 1,25 DIHYDROXY
Vitamin D 1, 25 (OH)2 Total: 18 pg/mL (ref 18–72)
Vitamin D2 1, 25 (OH)2: 9 pg/mL
Vitamin D3 1, 25 (OH)2: 9 pg/mL

## 2021-10-11 ENCOUNTER — Other Ambulatory Visit: Payer: Self-pay | Admitting: *Deleted

## 2021-10-11 ENCOUNTER — Ambulatory Visit: Payer: Medicare Other | Admitting: Medical

## 2021-10-11 DIAGNOSIS — E559 Vitamin D deficiency, unspecified: Secondary | ICD-10-CM

## 2021-10-17 ENCOUNTER — Other Ambulatory Visit: Payer: Self-pay | Admitting: Medical

## 2021-11-13 ENCOUNTER — Other Ambulatory Visit: Payer: Self-pay | Admitting: Medical

## 2021-11-14 ENCOUNTER — Telehealth: Payer: Self-pay | Admitting: Medical

## 2021-11-14 NOTE — Telephone Encounter (Signed)
Left message for patient to call back and schedule Medicare Annual Wellness Visit (AWV) in office.  ° °If not able to come in office, please offer to do virtually or by telephone.  Left office number and my jabber #[phone]. ° °Due for AWVI ° °Please schedule at anytime with Nurse Health Advisor. °  °

## 2021-11-15 ENCOUNTER — Other Ambulatory Visit: Payer: Self-pay | Admitting: Medical

## 2021-11-17 ENCOUNTER — Other Ambulatory Visit: Payer: Self-pay | Admitting: Medical

## 2021-11-18 ENCOUNTER — Other Ambulatory Visit: Payer: Self-pay | Admitting: Medical

## 2021-11-22 DIAGNOSIS — T310 Burns involving less than 10% of body surface: Secondary | ICD-10-CM

## 2021-11-22 DIAGNOSIS — T2131XA Burn of third degree of chest wall, initial encounter: Secondary | ICD-10-CM

## 2021-11-22 HISTORY — DX: Burn of third degree of chest wall, initial encounter: T21.31XA

## 2021-11-22 HISTORY — DX: Burns involving less than 10% of body surface: T31.0

## 2021-11-30 ENCOUNTER — Other Ambulatory Visit: Payer: Self-pay | Admitting: Medical

## 2021-12-07 ENCOUNTER — Other Ambulatory Visit: Payer: Self-pay | Admitting: Medical

## 2021-12-07 ENCOUNTER — Telehealth: Payer: Self-pay | Admitting: Medical

## 2021-12-07 NOTE — Telephone Encounter (Signed)
Left message for patient to call back and schedule Medicare Annual Wellness Visit (AWV) in office.  ° °If not able to come in office, please offer to do virtually or by telephone.  Left office number and my jabber #[phone]. ° °Due for AWVI ° °Please schedule at anytime with Nurse Health Advisor. °  °

## 2021-12-14 ENCOUNTER — Other Ambulatory Visit: Payer: Self-pay | Admitting: Medical

## 2021-12-20 ENCOUNTER — Other Ambulatory Visit: Payer: Self-pay | Admitting: Medical

## 2021-12-20 NOTE — Telephone Encounter (Addendum)
Pharmacy comment: Product Backordered/Unavailable:RX THAT WAS SENT OVER FOR PT IS NO LONGER BEING MADE BY THE COMPANY....  Sent in levimir flexpen. Our pharmacy states that is availablle. Sent rx. Can you check they filled. Thanks

## 2021-12-21 ENCOUNTER — Other Ambulatory Visit: Payer: Self-pay | Admitting: Medical

## 2021-12-21 MED ORDER — LEVEMIR FLEXPEN 100 UNIT/ML ~~LOC~~ SOPN: 36.0000 [IU] | PEN_INJECTOR | Freq: Every day | SUBCUTANEOUS | 11 refills | Status: DC

## 2021-12-21 NOTE — Addendum Note (Signed)
Addended by: Anabel Halon on: 12/21/2021 01:31 PM   Modules accepted: Orders

## 2021-12-21 NOTE — Telephone Encounter (Signed)
Please see pharmacy comment. I contacted [facility] and the pharmacist stated that the Levemir Flextouch 100 Unit/ML has been discontinued and will no longer be available. Pharmacist had no suggestions for alternatives. Please advise, thanks.

## 2021-12-23 ENCOUNTER — Other Ambulatory Visit: Payer: Self-pay

## 2021-12-23 MED ORDER — METFORMIN HCL 1000 MG PO TABS: 1000.0000 mg | ORAL_TABLET | Freq: Two times a day (BID) | ORAL | 3 refills | Status: DC

## 2021-12-23 MED ORDER — BD PEN NEEDLE NANO U/F 32G X 4 MM MISC: 5 refills | Status: DC

## 2022-01-03 ENCOUNTER — Other Ambulatory Visit: Payer: Self-pay | Admitting: Medical

## 2022-01-10 ENCOUNTER — Other Ambulatory Visit: Payer: Self-pay | Admitting: Medical

## 2022-01-18 ENCOUNTER — Other Ambulatory Visit: Payer: Self-pay | Admitting: Medical

## 2022-01-18 NOTE — Progress Notes (Addendum)
? ?Subjective:  ? Jasmine Keith is a 71 y.o. female who presents for an Initial Medicare Annual Wellness Visit. ?I connected with  Jasmine Keith on 01/19/22 by a audio enabled telemedicine application and verified that I am speaking with the correct person using two identifiers. ? ?Patient Location: Home ? ?Provider Location: Office/Clinic ? ?I discussed the limitations of evaluation and management by telemedicine. The patient expressed understanding and agreed to proceed.  ? ?Review of Systems    ?Cardiac Risk Factors include: advanced age (>74mn, >>54women);dyslipidemia;diabetes mellitus;hypertension ? ?   ?Objective:  ?  ?There were no vitals filed for this visit. ?There is no height or weight on file to calculate BMI. ? ?Advanced Directives 01/19/2022 05/11/2021 03/18/2021  ?Does Patient Have a Medical Advance Directive? No No No  ?Would patient like information on creating a medical advance directive? No - Patient declined - No - Patient declined  ? ? ?Current Medications (verified) ?Outpatient Encounter Medications as of 01/19/2022  ?Medication Sig  ? alendronate (FOSAMAX) 70 MG tablet TAKE 1 TABLET BY MOUTH ONCE A WEEK. TAKE WITH A FULL GLASS OF WATER ON AN EMPTY STOMACH.  ? amLODipine (NORVASC) 10 MG tablet TAKE 1 TABLET BY MOUTH EVERY DAY  ? busPIRone (BUSPAR) 7.5 MG tablet Take 1 tablet (7.5 mg total) by mouth 2 (two) times daily.  ? dicyclomine (BENTYL) 10 MG capsule TAKE 1 CAPSULE BY MOUTH 3 TIMES DAILY BEFORE MEALS.  ? fenofibrate 160 MG tablet TAKE 1 TABLET BY MOUTH EVERY DAY  ? gabapentin (NEURONTIN) 800 MG tablet TAKE 1 TABLET BY MOUTH 2 TIMES DAILY.  ? hydrALAZINE (APRESOLINE) 25 MG tablet TAKE 1 TABLET BY MOUTH THREE TIMES A DAY  ? insulin detemir (LEVEMIR FLEXPEN) 100 UNIT/ML FlexPen Inject 36 Units into the skin at bedtime.  ? Insulin Pen Needle (BD PEN NEEDLE NANO U/F) 32G X 4 MM MISC Use one needle daily to inject insulin  ? levothyroxine (SYNTHROID) 25 MCG tablet TAKE 1 TABLET BY MOUTH  EVERY DAY BEFORE BREAKFAST  ? losartan (COZAAR) 100 MG tablet TAKE 1 TABLET BY MOUTH EVERY DAY  ? meclizine (ANTIVERT) 25 MG tablet Take 1 tablet (25 mg total) by mouth 3 (three) times daily as needed for dizziness.  ? meloxicam (MOBIC) 7.5 MG tablet Take 7.5 mg by mouth daily as needed.  ? metFORMIN (GLUCOPHAGE) 1000 MG tablet Take 1 tablet (1,000 mg total) by mouth 2 (two) times daily with a meal.  ? metoprolol succinate (TOPROL-XL) 50 MG 24 hr tablet Take 1 tablet (50 mg total) by mouth daily. Take with or immediately following a meal.  ? pantoprazole (PROTONIX) 40 MG tablet TAKE 1 TABLET BY MOUTH EVERY DAY  ? venlafaxine XR (EFFEXOR-XR) 75 MG 24 hr capsule TAKE 1 CAPSULE BY MOUTH DAILY WITH BREAKFAST.  ? Vitamin D, Ergocalciferol, (DRISDOL) 1.25 MG (50000 UNIT) CAPS capsule Take 1 capsule (50,000 Units total) by mouth every 7 (seven) days.  ? [DISCONTINUED] amoxicillin-clavulanate (AUGMENTIN) 875-125 MG tablet Take 1 tablet by mouth 2 (two) times daily.  ? ?Facility-Administered Encounter Medications as of 01/19/2022  ?Medication  ? 0.9 %  sodium chloride infusion  ? ? ?Allergies (verified) ?Patient has no known allergies.  ? ?History: ?Past Medical History:  ?Diagnosis Date  ? Anxiety   ? Diabetes mellitus without complication ([place]   ? Fibromyalgia   ? GERD (gastroesophageal reflux disease)   ? Hyperlipidemia   ? Hypertension   ? Osteoporosis   ? Thyroid disease   ? ?  Past Surgical History:  ?Procedure Laterality Date  ? coronary stent placed  1993  ? ?Family History  ?Problem Relation Age of Onset  ? Diabetes Mother   ? Kidney disease Mother   ? Diabetes Father   ? Cancer Sister   ?     lung  ? Kidney disease Sister   ? Thyroid disease Neg Hx   ? Colon cancer Neg Hx   ? Esophageal cancer Neg Hx   ? Rectal cancer Neg Hx   ? Stomach cancer Neg Hx   ? ?Social History  ? ?Socioeconomic History  ? Marital status: Married  ?  Spouse name: Not on file  ? Number of children: Not on file  ? Years of education: Not on  file  ? Highest education level: Not on file  ?Occupational History  ? Not on file  ?Tobacco Use  ? Smoking status: Every Day  ?  Packs/day: 0.50  ?  Types: Cigarettes  ? Smokeless tobacco: Never  ?Vaping Use  ? Vaping Use: Never used  ?Substance and Sexual Activity  ? Alcohol use: Yes  ?  Comment: Rare  ? Drug use: Not Currently  ? Sexual activity: Not on file  ?Other Topics Concern  ? Not on file  ?Social History Narrative  ? Not on file  ? ?Social Determinants of Health  ? ?Financial Resource Strain: Low Risk   ? Difficulty of Paying Living Expenses: Not hard at all  ?Food Insecurity: No Food Insecurity  ? Worried About Charity fundraiser in the Last Year: Never true  ? Ran Out of Food in the Last Year: Never true  ?Transportation Needs: No Transportation Needs  ? Lack of Transportation (Medical): No  ? Lack of Transportation (Non-Medical): No  ?Physical Activity: Insufficiently Active  ? Days of Exercise per Week: 7 days  ? Minutes of Exercise per Session: 20 min  ?Stress: No Stress Concern Present  ? Feeling of Stress : Not at all  ?Social Connections: Socially Isolated  ? Frequency of Communication with Friends and Family: Twice a week  ? Frequency of Social Gatherings with Friends and Family: Never  ? Attends Religious Services: Never  ? Active Member of Clubs or Organizations: No  ? Attends Archivist Meetings: Never  ? Marital Status: Married  ? ? ?Tobacco Counseling ?Ready to quit: Not Answered ?Counseling given: Not Answered ? ? ?Clinical Intake: ? ?Pre-visit preparation completed: Yes ? ?Pain : No/denies pain ? ?  ? ?Nutritional Risks: None ?Diabetes: Yes ?CBG done?: No ?Did pt. bring in CBG monitor from home?: No ? ?  ? ?Diabetic?No ? ?Interpreter Needed?: No ? ?Information entered by :: Deborh Pense ? ? ?Activities of Daily Living ?In your present state of health, do you have any difficulty performing the following activities: 01/19/2022  ?Hearing? N  ?Vision? Y  ?Difficulty concentrating  or making decisions? N  ?Walking or climbing stairs? Y  ?Dressing or bathing? N  ?Doing errands, shopping? N  ?Preparing Food and eating ? N  ?Using the Toilet? N  ?In the past six months, have you accidently leaked urine? N  ?Do you have problems with loss of bowel control? N  ?Managing your Medications? N  ?Managing your Finances? N  ?Housekeeping or managing your Housekeeping? N  ?Some recent data might be hidden  ? ? ?Patient Care Team: ?Saguier, Iris Pert as PCP - General (Internal Medicine) ? ?Indicate any recent Medical Services you may have received from other  than [facility] providers in the past year (date may be approximate). ? ?   ?Assessment:  ? This is a routine wellness examination for Jasmine Keith. ? ?Hearing/Vision screen ?No results found. ? ?Dietary issues and exercise activities discussed: ?Current Exercise Habits: The patient does not participate in regular exercise at present, Exercise limited by: None identified ? ? Goals Addressed   ? ?  ?  ?  ?  ? This Visit's Progress  ?  Patient Stated     ?  Maintain health ?  ? ?  ? ?Depression Screen ?PHQ 2/9 Scores 01/19/2022 03/09/2021 01/03/2021 07/23/2020  ?PHQ - 2 Score '2 6 5 3  '$ ?PHQ- 9 Score '11 21 15 10  '$ ?  ?Fall Risk ?Fall Risk  01/19/2022  ?Falls in the past year? 0  ?Number falls in past yr: 0  ?Injury with Fall? 0  ?Risk for fall due to : No Fall Risks  ?Follow up Falls evaluation completed  ? ? ?FALL RISK PREVENTION PERTAINING TO THE HOME: ? ?Any stairs in or around the home? No  ?If so, are there any without handrails?  N/A ?Home free of loose throw rugs in walkways, pet beds, electrical cords, etc? Yes  ?Adequate lighting in your home to reduce risk of falls? Yes  ? ?ASSISTIVE DEVICES UTILIZED TO PREVENT FALLS: ? ?Life alert? No  ?Use of a cane, walker or w/c? No  ?Grab bars in the bathroom? No  ?Shower chair or bench in shower? No  ?Elevated toilet seat or a handicapped toilet? No  ? ?TIMED UP AND GO: ? ?Was the test performed? No .  ? ?Cognitive  Function: ?  ?  ?6CIT Screen 01/19/2022  ?What Year? 0 points  ?What time? 0 points  ?Count back from 20 0 points  ?Months in reverse 0 points  ?Repeat phrase 0 points  ? ? ?Immunizations ?Immunization History  ?

## 2022-01-19 ENCOUNTER — Ambulatory Visit (INDEPENDENT_AMBULATORY_CARE_PROVIDER_SITE_OTHER): Payer: Medicare Other

## 2022-01-19 DIAGNOSIS — Z1231 Encounter for screening mammogram for malignant neoplasm of breast: Secondary | ICD-10-CM | POA: Diagnosis not present

## 2022-01-19 DIAGNOSIS — Z0000 Encounter for general adult medical examination without abnormal findings: Secondary | ICD-10-CM

## 2022-01-19 DIAGNOSIS — Z0100 Encounter for examination of eyes and vision without abnormal findings: Secondary | ICD-10-CM

## 2022-01-19 NOTE — Patient Instructions (Signed)
Jasmine Keith , ?Thank you for taking time to come for your Medicare Wellness Visit. I appreciate your ongoing commitment to your health goals. Please review the following plan we discussed and let me know if I can assist you in the future.  ? ?Screening recommendations/referrals: ?Colonoscopy: 03/18/21 due 03/18/28 ?Mammogram: ordered 01/19/22 ?Bone Density: 06/23/20 due 06/23/22 ?Recommended yearly ophthalmology/optometry visit for glaucoma screening and checkup ?Recommended yearly dental visit for hygiene and checkup ? ?Vaccinations: ?Influenza vaccine: up to date ?Pneumococcal vaccine: Due-May obtain vaccine at your local pharmacy.  ?Tdap vaccine: up to date per pt ?Shingles vaccine: Due-May obtain vaccine at your local pharmacy.    ?Covid-19:Due-May obtain vaccine at your local pharmacy.  ? ?Advanced directives: no ? ?Conditions/risks identified: see problem list ? ?Next appointment: Follow up in one year for your annual wellness visit  ? ? ?Preventive Care 70 Years and Older, Female ?Preventive care refers to lifestyle choices and visits with your health care provider that can promote health and wellness. ?What does preventive care include? ?A yearly physical exam. This is also called an annual well check. ?Dental exams once or twice a year. ?Routine eye exams. Ask your health care provider how often you should have your eyes checked. ?Personal lifestyle choices, including: ?Daily care of your teeth and gums. ?Regular physical activity. ?Eating a healthy diet. ?Avoiding tobacco and drug use. ?Limiting alcohol use. ?Practicing safe sex. ?Taking low-dose aspirin every day. ?Taking vitamin and mineral supplements as recommended by your health care provider. ?What happens during an annual well check? ?The services and screenings done by your health care provider during your annual well check will depend on your age, overall health, lifestyle risk factors, and family history of disease. ?Counseling  ?Your health care  provider may ask you questions about your: ?Alcohol use. ?Tobacco use. ?Drug use. ?Emotional well-being. ?Home and relationship well-being. ?Sexual activity. ?Eating habits. ?History of falls. ?Memory and ability to understand (cognition). ?Work and work Statistician. ?Reproductive health. ?Screening  ?You may have the following tests or measurements: ?Height, weight, and BMI. ?Blood pressure. ?Lipid and cholesterol levels. These may be checked every 5 years, or more frequently if you are over 42 years old. ?Skin check. ?Lung cancer screening. You may have this screening every year starting at age 49 if you have a 30-pack-year history of smoking and currently smoke or have quit within the past 15 years. ?Fecal occult blood test (FOBT) of the stool. You may have this test every year starting at age 49. ?Flexible sigmoidoscopy or colonoscopy. You may have a sigmoidoscopy every 5 years or a colonoscopy every 10 years starting at age 60. ?Hepatitis C blood test. ?Hepatitis B blood test. ?Sexually transmitted disease (STD) testing. ?Diabetes screening. This is done by checking your blood sugar (glucose) after you have not eaten for a while (fasting). You may have this done every 1-3 years. ?Bone density scan. This is done to screen for osteoporosis. You may have this done starting at age 60. ?Mammogram. This may be done every 1-2 years. Talk to your health care provider about how often you should have regular mammograms. ?Talk with your health care provider about your test results, treatment options, and if necessary, the need for more tests. ?Vaccines  ?Your health care provider may recommend certain vaccines, such as: ?Influenza vaccine. This is recommended every year. ?Tetanus, diphtheria, and acellular pertussis (Tdap, Td) vaccine. You may need a Td booster every 10 years. ?Zoster vaccine. You may need this after age 61. ?Pneumococcal  13-valent conjugate (PCV13) vaccine. One dose is recommended after age  48. ?Pneumococcal polysaccharide (PPSV23) vaccine. One dose is recommended after age 64. ?Talk to your health care provider about which screenings and vaccines you need and how often you need them. ?This information is not intended to replace advice given to you by your health care provider. Make sure you discuss any questions you have with your health care provider. ?Document Released: 11/19/2015 Document Revised: 07/12/2016 Document Reviewed: 08/24/2015 ?Elsevier Interactive Patient Education ? 2017 [place]. ? ?Fall Prevention in the Home ?Falls can cause injuries. They can happen to people of all ages. There are many things you can do to make your home safe and to help prevent falls. ?What can I do on the outside of my home? ?Regularly fix the edges of walkways and driveways and fix any cracks. ?Remove anything that might make you trip as you walk through a door, such as a raised step or threshold. ?Trim any bushes or trees on the path to your home. ?Use bright outdoor lighting. ?Clear any walking paths of anything that might make someone trip, such as rocks or tools. ?Regularly check to see if handrails are loose or broken. Make sure that both sides of any steps have handrails. ?Any raised decks and porches should have guardrails on the edges. ?Have any leaves, snow, or ice cleared regularly. ?Use sand or salt on walking paths during winter. ?Clean up any spills in your garage right away. This includes oil or grease spills. ?What can I do in the bathroom? ?Use night lights. ?Install grab bars by the toilet and in the tub and shower. Do not use towel bars as grab bars. ?Use non-skid mats or decals in the tub or shower. ?If you need to sit down in the shower, use a plastic, non-slip stool. ?Keep the floor dry. Clean up any water that spills on the floor as soon as it happens. ?Remove soap buildup in the tub or shower regularly. ?Attach bath mats securely with double-sided non-slip rug tape. ?Do not have throw  rugs and other things on the floor that can make you trip. ?What can I do in the bedroom? ?Use night lights. ?Make sure that you have a light by your bed that is easy to reach. ?Do not use any sheets or blankets that are too big for your bed. They should not hang down onto the floor. ?Have a firm chair that has side arms. You can use this for support while you get dressed. ?Do not have throw rugs and other things on the floor that can make you trip. ?What can I do in the kitchen? ?Clean up any spills right away. ?Avoid walking on wet floors. ?Keep items that you use a lot in easy-to-reach places. ?If you need to reach something above you, use a strong step stool that has a grab bar. ?Keep electrical cords out of the way. ?Do not use floor polish or wax that makes floors slippery. If you must use wax, use non-skid floor wax. ?Do not have throw rugs and other things on the floor that can make you trip. ?What can I do with my stairs? ?Do not leave any items on the stairs. ?Make sure that there are handrails on both sides of the stairs and use them. Fix handrails that are broken or loose. Make sure that handrails are as long as the stairways. ?Check any carpeting to make sure that it is firmly attached to the stairs. Fix any carpet  that is loose or worn. ?Avoid having throw rugs at the top or bottom of the stairs. If you do have throw rugs, attach them to the floor with carpet tape. ?Make sure that you have a light switch at the top of the stairs and the bottom of the stairs. If you do not have them, ask someone to add them for you. ?What else can I do to help prevent falls? ?Wear shoes that: ?Do not have high heels. ?Have rubber bottoms. ?Are comfortable and fit you well. ?Are closed at the toe. Do not wear sandals. ?If you use a stepladder: ?Make sure that it is fully opened. Do not climb a closed stepladder. ?Make sure that both sides of the stepladder are locked into place. ?Ask someone to hold it for you, if  possible. ?Clearly mark and make sure that you can see: ?Any grab bars or handrails. ?First and last steps. ?Where the edge of each step is. ?Use tools that help you move around (mobility aids) if they are needed. These

## 2022-01-24 ENCOUNTER — Telehealth (HOSPITAL_BASED_OUTPATIENT_CLINIC_OR_DEPARTMENT_OTHER): Payer: Self-pay

## 2022-02-01 ENCOUNTER — Telehealth (HOSPITAL_BASED_OUTPATIENT_CLINIC_OR_DEPARTMENT_OTHER): Payer: Self-pay

## 2022-02-03 ENCOUNTER — Other Ambulatory Visit: Payer: Self-pay | Admitting: Medical

## 2022-02-08 ENCOUNTER — Other Ambulatory Visit: Payer: Self-pay | Admitting: Medical

## 2022-02-15 ENCOUNTER — Other Ambulatory Visit: Payer: Self-pay | Admitting: Medical

## 2022-02-26 ENCOUNTER — Other Ambulatory Visit: Payer: Self-pay | Admitting: Medical

## 2022-03-03 ENCOUNTER — Other Ambulatory Visit: Payer: Self-pay | Admitting: Medical

## 2022-03-23 ENCOUNTER — Other Ambulatory Visit: Payer: Self-pay | Admitting: Medical

## 2022-04-02 ENCOUNTER — Other Ambulatory Visit: Payer: Self-pay | Admitting: Endocrinology

## 2022-04-02 DIAGNOSIS — R5383 Other fatigue: Secondary | ICD-10-CM

## 2022-04-24 ENCOUNTER — Other Ambulatory Visit: Payer: Self-pay

## 2022-04-24 DIAGNOSIS — R5383 Other fatigue: Secondary | ICD-10-CM

## 2022-04-24 MED ORDER — AMLODIPINE BESYLATE 10 MG PO TABS: 10.0000 mg | ORAL_TABLET | Freq: Every day | ORAL | 0 refills | Status: DC

## 2022-04-24 MED ORDER — LEVOTHYROXINE SODIUM 25 MCG PO TABS: ORAL_TABLET | ORAL | 0 refills | Status: DC

## 2022-04-24 MED ORDER — GABAPENTIN 800 MG PO TABS: 800.0000 mg | ORAL_TABLET | Freq: Two times a day (BID) | ORAL | 0 refills | Status: DC

## 2022-05-06 ENCOUNTER — Other Ambulatory Visit: Payer: Self-pay | Admitting: Medical

## 2022-05-07 ENCOUNTER — Other Ambulatory Visit: Payer: Self-pay | Admitting: Medical

## 2022-05-19 ENCOUNTER — Other Ambulatory Visit: Payer: Self-pay | Admitting: Medical

## 2022-05-22 ENCOUNTER — Other Ambulatory Visit: Payer: Self-pay | Admitting: Medical

## 2022-05-25 ENCOUNTER — Telehealth: Payer: Self-pay

## 2022-05-25 ENCOUNTER — Inpatient Hospital Stay (HOSPITAL_BASED_OUTPATIENT_CLINIC_OR_DEPARTMENT_OTHER)
Admission: EM | Admit: 2022-05-25 | Discharge: 2022-05-29 | DRG: 641 | Disposition: A | Discharge status: Home / Self Care | Payer: Medicare Other | Attending: Internal Medicine | Admitting: Internal Medicine

## 2022-05-25 ENCOUNTER — Other Ambulatory Visit: Payer: Self-pay

## 2022-05-25 ENCOUNTER — Ambulatory Visit (INDEPENDENT_AMBULATORY_CARE_PROVIDER_SITE_OTHER): Payer: Medicare Other | Admitting: Medical

## 2022-05-25 ENCOUNTER — Encounter: Payer: Self-pay | Admitting: Medical

## 2022-05-25 ENCOUNTER — Encounter (HOSPITAL_BASED_OUTPATIENT_CLINIC_OR_DEPARTMENT_OTHER): Payer: Self-pay | Admitting: Emergency Medicine

## 2022-05-25 VITALS — BP 130/70 | HR 68 | Resp 18 | Ht 61.0 in | Wt 108.8 lb

## 2022-05-25 DIAGNOSIS — E860 Dehydration: Principal | ICD-10-CM | POA: Diagnosis present

## 2022-05-25 DIAGNOSIS — F419 Anxiety disorder, unspecified: Secondary | ICD-10-CM | POA: Diagnosis present

## 2022-05-25 DIAGNOSIS — Z7983 Long term (current) use of bisphosphonates: Secondary | ICD-10-CM

## 2022-05-25 DIAGNOSIS — F17210 Nicotine dependence, cigarettes, uncomplicated: Secondary | ICD-10-CM | POA: Diagnosis not present

## 2022-05-25 DIAGNOSIS — F17200 Nicotine dependence, unspecified, uncomplicated: Secondary | ICD-10-CM

## 2022-05-25 DIAGNOSIS — Z955 Presence of coronary angioplasty implant and graft: Secondary | ICD-10-CM

## 2022-05-25 DIAGNOSIS — Z791 Long term (current) use of non-steroidal anti-inflammatories (NSAID): Secondary | ICD-10-CM

## 2022-05-25 DIAGNOSIS — K219 Gastro-esophageal reflux disease without esophagitis: Secondary | ICD-10-CM | POA: Diagnosis not present

## 2022-05-25 DIAGNOSIS — Z794 Long term (current) use of insulin: Secondary | ICD-10-CM

## 2022-05-25 DIAGNOSIS — N179 Acute kidney failure, unspecified: Secondary | ICD-10-CM | POA: Diagnosis not present

## 2022-05-25 DIAGNOSIS — E039 Hypothyroidism, unspecified: Secondary | ICD-10-CM | POA: Diagnosis not present

## 2022-05-25 DIAGNOSIS — R195 Other fecal abnormalities: Secondary | ICD-10-CM

## 2022-05-25 DIAGNOSIS — R531 Weakness: Principal | ICD-10-CM

## 2022-05-25 DIAGNOSIS — M810 Age-related osteoporosis without current pathological fracture: Secondary | ICD-10-CM | POA: Diagnosis present

## 2022-05-25 DIAGNOSIS — R790 Abnormal level of blood mineral: Secondary | ICD-10-CM

## 2022-05-25 DIAGNOSIS — R778 Other specified abnormalities of plasma proteins: Secondary | ICD-10-CM | POA: Diagnosis not present

## 2022-05-25 DIAGNOSIS — R5383 Other fatigue: Secondary | ICD-10-CM

## 2022-05-25 DIAGNOSIS — I10 Essential (primary) hypertension: Secondary | ICD-10-CM | POA: Diagnosis not present

## 2022-05-25 DIAGNOSIS — E11649 Type 2 diabetes mellitus with hypoglycemia without coma: Secondary | ICD-10-CM | POA: Diagnosis not present

## 2022-05-25 DIAGNOSIS — Z7985 Long-term (current) use of injectable non-insulin antidiabetic drugs: Secondary | ICD-10-CM

## 2022-05-25 DIAGNOSIS — I714 Abdominal aortic aneurysm, without rupture, unspecified: Secondary | ICD-10-CM

## 2022-05-25 DIAGNOSIS — Z79899 Other long term (current) drug therapy: Secondary | ICD-10-CM

## 2022-05-25 DIAGNOSIS — E785 Hyperlipidemia, unspecified: Secondary | ICD-10-CM | POA: Diagnosis present

## 2022-05-25 DIAGNOSIS — Z833 Family history of diabetes mellitus: Secondary | ICD-10-CM | POA: Diagnosis not present

## 2022-05-25 DIAGNOSIS — M797 Fibromyalgia: Secondary | ICD-10-CM | POA: Diagnosis present

## 2022-05-25 DIAGNOSIS — Z841 Family history of disorders of kidney and ureter: Secondary | ICD-10-CM | POA: Diagnosis not present

## 2022-05-25 DIAGNOSIS — Z79890 Hormone replacement therapy (postmenopausal): Secondary | ICD-10-CM

## 2022-05-25 DIAGNOSIS — R197 Diarrhea, unspecified: Secondary | ICD-10-CM | POA: Diagnosis not present

## 2022-05-25 DIAGNOSIS — D649 Anemia, unspecified: Secondary | ICD-10-CM | POA: Diagnosis not present

## 2022-05-25 DIAGNOSIS — E119 Type 2 diabetes mellitus without complications: Secondary | ICD-10-CM

## 2022-05-25 DIAGNOSIS — Z720 Tobacco use: Secondary | ICD-10-CM

## 2022-05-25 DIAGNOSIS — Z801 Family history of malignant neoplasm of trachea, bronchus and lung: Secondary | ICD-10-CM

## 2022-05-25 DIAGNOSIS — A498 Other bacterial infections of unspecified site: Secondary | ICD-10-CM

## 2022-05-25 HISTORY — DX: Dehydration: E86.0

## 2022-05-25 LAB — COMPREHENSIVE METABOLIC PANEL
ALT: 14 U/L (ref 0–35)
ALT: 16 U/L (ref 0–44)
AST: 18 U/L (ref 0–37)
AST: 25 U/L (ref 15–41)
Albumin: 4.2 g/dL (ref 3.5–5.2)
Albumin: 3.8 g/dL (ref 3.5–5.0)
Alkaline Phosphatase: 39 U/L (ref 39–117)
Alkaline Phosphatase: 45 U/L (ref 38–126)
Anion gap: 10 (ref 5–15)
BUN: 37 mg/dL — ABNORMAL HIGH (ref 6–23)
BUN: 37 mg/dL — ABNORMAL HIGH (ref 8–23)
CO2: 22 mEq/L (ref 19–32)
CO2: 17 mmol/L — ABNORMAL LOW (ref 22–32)
Calcium: 10.3 mg/dL (ref 8.4–10.5)
Calcium: 9.7 mg/dL (ref 8.9–10.3)
Chloride: 101 mEq/L (ref 96–112)
Chloride: 108 mmol/L (ref 98–111)
Creatinine, Ser: 1.31 mg/dL — ABNORMAL HIGH (ref 0.40–1.20)
Creatinine, Ser: 1.43 mg/dL — ABNORMAL HIGH (ref 0.44–1.00)
GFR, Estimated: 40 mL/min — ABNORMAL LOW (ref >60)
GFR: 41.55 mL/min — ABNORMAL LOW (ref >60.00)
Glucose, Bld: 111 mg/dL — ABNORMAL HIGH (ref 70–99)
Glucose, Bld: 182 mg/dL — ABNORMAL HIGH (ref 70–99)
Potassium: 4.7 mEq/L (ref 3.5–5.1)
Potassium: 4.1 mmol/L (ref 3.5–5.1)
Sodium: 133 mEq/L — ABNORMAL LOW (ref 135–145)
Sodium: 135 mmol/L (ref 135–145)
Total Bilirubin: 0.4 mg/dL (ref 0.2–1.2)
Total Bilirubin: 0.2 mg/dL — ABNORMAL LOW (ref 0.3–1.2)
Total Protein: 7 g/dL (ref 6.0–8.3)
Total Protein: 6.8 g/dL (ref 6.5–8.1)

## 2022-05-25 LAB — CBC WITH DIFFERENTIAL/PLATELET
Abs Immature Granulocytes: 0.03 10*3/uL (ref 0.00–0.07)
Basophils Absolute: 0.1 10*3/uL (ref 0.0–0.1)
Basophils Absolute: 0.1 10*3/uL (ref 0.0–0.1)
Basophils Relative: 1.1 % (ref 0.0–3.0)
Basophils Relative: 1 %
Eosinophils Absolute: 0.1 10*3/uL (ref 0.0–0.7)
Eosinophils Absolute: 0.2 10*3/uL (ref 0.0–0.5)
Eosinophils Relative: 1.1 % (ref 0.0–5.0)
Eosinophils Relative: 2 %
HCT: 30.4 % — ABNORMAL LOW (ref 36.0–46.0)
HCT: 29.9 % — ABNORMAL LOW (ref 36.0–46.0)
Hemoglobin: 10 g/dL — ABNORMAL LOW (ref 12.0–15.0)
Hemoglobin: 10 g/dL — ABNORMAL LOW (ref 12.0–15.0)
Immature Granulocytes: 0 %
Lymphocytes Relative: 22.3 % (ref 12.0–46.0)
Lymphocytes Relative: 25 %
Lymphs Abs: 1.5 10*3/uL (ref 0.7–4.0)
Lymphs Abs: 1.8 10*3/uL (ref 0.7–4.0)
MCH: 27.2 pg (ref 26.0–34.0)
MCHC: 32.8 g/dL (ref 30.0–36.0)
MCHC: 33.4 g/dL (ref 30.0–36.0)
MCV: 81.3 fl (ref 78.0–100.0)
MCV: 81.3 fL (ref 80.0–100.0)
Monocytes Absolute: 0.3 10*3/uL (ref 0.1–1.0)
Monocytes Absolute: 0.4 10*3/uL (ref 0.1–1.0)
Monocytes Relative: 4.9 % (ref 3.0–12.0)
Monocytes Relative: 5 %
Neutro Abs: 4.9 10*3/uL (ref 1.4–7.7)
Neutro Abs: 5 10*3/uL (ref 1.7–7.7)
Neutrophils Relative %: 70.6 % (ref 43.0–77.0)
Neutrophils Relative %: 67 %
Platelets: 389 10*3/uL (ref 150.0–400.0)
Platelets: 442 10*3/uL — ABNORMAL HIGH (ref 150–400)
RBC: 3.74 Mil/uL — ABNORMAL LOW (ref 3.87–5.11)
RBC: 3.68 MIL/uL — ABNORMAL LOW (ref 3.87–5.11)
RDW: 14.2 % (ref 11.5–15.5)
RDW: 13.7 % (ref 11.5–15.5)
WBC: 6.9 10*3/uL (ref 4.0–10.5)
WBC: 7.5 10*3/uL (ref 4.0–10.5)
nRBC: 0 % (ref 0.0–0.2)

## 2022-05-25 LAB — TROPONIN I (HIGH SENSITIVITY)
High Sens Troponin I: 15 ng/L (ref 2–17)
Troponin I (High Sensitivity): 21 ng/L — ABNORMAL HIGH (ref <18)
Troponin I (High Sensitivity): 19 ng/L — ABNORMAL HIGH (ref <18)

## 2022-05-25 LAB — CBG MONITORING, ED: Glucose-Capillary: 164 mg/dL — ABNORMAL HIGH (ref 70–99)

## 2022-05-25 LAB — TSH: TSH: 4.65 u[IU]/mL (ref 0.35–5.50)

## 2022-05-25 LAB — IRON: Iron: 69 ug/dL (ref 42–145)

## 2022-05-25 LAB — VITAMIN B12: Vitamin B-12: 482 pg/mL (ref 211–911)

## 2022-05-25 LAB — MAGNESIUM
Magnesium: 0.7 mg/dL — CL (ref 1.5–2.5)
Magnesium: 0.8 mg/dL — CL (ref 1.7–2.4)

## 2022-05-25 LAB — T4, FREE: Free T4: 1.03 ng/dL (ref 0.60–1.60)

## 2022-05-25 MED ORDER — SODIUM CHLORIDE 0.9 % IV SOLN: Freq: Once | INTRAVENOUS | Status: AC

## 2022-05-25 MED ORDER — MAGNESIUM SULFATE 2 GM/50ML IV SOLN
2.0000 g | Freq: Once | INTRAVENOUS | Status: AC
Start: 2022-05-25 — End: 2022-05-25
  Administered 2022-05-25: 2 g via INTRAVENOUS
  Filled 2022-05-25: qty 50

## 2022-05-25 MED ORDER — SODIUM CHLORIDE 0.9 % IV BOLUS
1000.0000 mL | Freq: Once | INTRAVENOUS | Status: AC
  Administered 2022-05-25: 1000 mL via INTRAVENOUS

## 2022-05-25 NOTE — ED Triage Notes (Signed)
Pt arrives pov ambuatory. Seen PCP today and was told her magnesium was low and she was dehydrated. Pt states she feels weak and fatigued. Recently admitted and treated for C-diff.

## 2022-05-25 NOTE — ED Notes (Signed)
Checked CBG 164

## 2022-05-25 NOTE — ED Notes (Signed)
Care Handoff/Patient Report given to Pepper Pike, RN at [facility]. All Questions Answered. Patient made aware of Inpatient Bed Status and Verbally Consents to Transfer.

## 2022-05-25 NOTE — Telephone Encounter (Signed)
CRITICAL VALUE STICKER  CRITICAL VALUE: Magnesium- 0.7  RECEIVER (on-site recipient of call): [place]. RMA  DATE & TIME NOTIFIED: 05/25/22. 2:07PM  MESSENGER (representative from lab):  MD NOTIFIED: Johnnette Gourd, and Dr Charlett Blake- since she is DOD Oletta Lamas 1/2 day)  TIME OF NOTIFICATION: 05/25/22. 2:07PM  RESPONSE:
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
## 2022-05-25 NOTE — Patient Instructions (Addendum)
Recent hospitalization in [place].  I do not have records but on discussion it appears that you had severe diarrhea with dehydration and then vasovagal syncopal type episode.  Diagnosed with C. difficile and symptoms much improved but still having loose stools.  I am placing stool studies to make sure no other cause found and make sure C. difficile has resolved.  Make sure you stay well-hydrated and after turning in stool studies can use Imodium to help with loose stools.  EKG done today since troponin was elevated during the hospitalization and hospital recommended cardiac follow-up. Ekg showed some slight bradycardia and some t was t waves present. No old ekg to compare.  On review of minimal paperwork from hospitalization there is a note regarding elevated troponin.  Based on the above description I am thinking he might have had severe dehydration and some demand ischemia.  No present chest pain or cardiac signs and symptoms.  Will get 1 set of stat troponin presently.  If that level were to be elevated then she would need to be evaluated emergency department.  If negative then we will go ahead and make referral to cardiologist as hospital [place] recommend cardiac evaluation.  Also known history of stent and new diagnosis of aortic aneurysm.  I do not have imaging studies of abdomen to review.  If you were to get any lifting abdomen pain recommend ED evaluation pending for record review.  Persisting fatigue and reported low magnesium during hospitalization.  Suspect probably had low potassium as well.  Will get metabolic panel, magnesium and fatigue labs.  Follow-up in 7 to 10 days or sooner if needed.  If you get any cardiac signs or symptoms , severe abd pain or severe dyspnea be then be seen in the ED. Will follow heart protein study.

## 2022-05-25 NOTE — Progress Notes (Signed)
Subjective:    Patient ID: Jasmine Keith, female    DOB: [birth year], 71 y.o.   MRN: [id number]  HPI  Pt states she visit her son in [place]. On second day was hospitalized. She states day was admitted started to vomit and have diarrhea that day. Her husband wanted her to go to ED. Pt declined. Then husband found her on bathroom flare.   I don't have pt records from [facility]. Pt states she was diagnosed with c dificile. Dehydrated at time of admission.   Pt states day before passed out she had a lot of diarrhea and vomiting. But had diarrhea for maybe a month before it got worse.   Pt states still has loose stools but was like water.   Pt states had low magnesium and maybe low k.  She was sent home on antibiotic yesterday. Pt states ran out of this antibiotic yesterday.  On limited paperwork that I see she was dx with gastroenteritis. Also note states elevated troponin.  Dc'd from hospital last Friday.  Also told abdominal aortic aneurysm on imaging.  Pt feels fatigue.   Review of Systems  Constitutional:  Positive for fatigue. Negative for chills.  HENT:  Negative for congestion.   Respiratory:  Negative for cough, chest tightness, shortness of breath and wheezing.   Cardiovascular:  Negative for chest pain and palpitations.  Gastrointestinal:  Negative for abdominal pain, constipation, diarrhea, nausea and vomiting.  Musculoskeletal:  Negative for back pain and myalgias.  Neurological:  Negative for dizziness and seizures.  Hematological:  Negative for adenopathy. Does not bruise/bleed easily.  Psychiatric/Behavioral:  Negative for behavioral problems.     Past Medical History:  Diagnosis Date   Anxiety    Diabetes mellitus without complication (HCC)    Fibromyalgia    GERD (gastroesophageal reflux disease)    Hyperlipidemia    Hypertension    Osteoporosis    Thyroid disease      Social History   Socioeconomic History   Marital status: Married     Spouse name: Not on file   Number of children: Not on file   Years of education: Not on file   Highest education level: Not on file  Occupational History   Not on file  Tobacco Use   Smoking status: Every Day    Packs/day: 0.50    Types: Cigarettes   Smokeless tobacco: Never  Vaping Use   Vaping Use: Never used  Substance and Sexual Activity   Alcohol use: Yes    Comment: Rare   Drug use: Not Currently   Sexual activity: Not on file  Other Topics Concern   Not on file  Social History Narrative   Not on file   Social Determinants of Health   Financial Resource Strain: Low Risk  (01/19/2022)   Overall Financial Resource Strain (CARDIA)    Difficulty of Paying Living Expenses: Not hard at all  Food Insecurity: No Food Insecurity (01/19/2022)   Hunger Vital Sign    Worried About Running Out of Food in the Last Year: Never true    Ran Out of Food in the Last Year: Never true  Transportation Needs: No Transportation Needs (01/19/2022)   PRAPARE - Hydrologist (Medical): No    Lack of Transportation (Non-Medical): No  Physical Activity: Insufficiently Active (01/19/2022)   Exercise Vital Sign    Days of Exercise per Week: 7 days    Minutes of Exercise per Session:  20 min  Stress: No Stress Concern Present (01/19/2022)   [place]    Feeling of Stress : Not at all  Social Connections: Socially Isolated (01/19/2022)   Social Connection and Isolation Panel [NHANES]    Frequency of Communication with Friends and Family: Twice a week    Frequency of Social Gatherings with Friends and Family: Never    Attends Religious Services: Never    Marine scientist or Organizations: No    Attends Archivist Meetings: Never    Marital Status: Married  Human resources officer Violence: Not At Risk (01/19/2022)   Humiliation, Afraid, Rape, and Kick questionnaire    Fear of Current or  Ex-Partner: No    Emotionally Abused: No    Physically Abused: No    Sexually Abused: No    Past Surgical History:  Procedure Laterality Date   coronary stent placed  1993    Family History  Problem Relation Age of Onset   Diabetes Mother    Kidney disease Mother    Diabetes Father    Cancer Sister        lung   Kidney disease Sister    Thyroid disease Neg Hx    Colon cancer Neg Hx    Esophageal cancer Neg Hx    Rectal cancer Neg Hx    Stomach cancer Neg Hx     No Known Allergies  Current Outpatient Medications on File Prior to Visit  Medication Sig Dispense Refill   alendronate (FOSAMAX) 70 MG tablet TAKE 1 TABLET BY MOUTH ONCE A WEEK. TAKE WITH A FULL GLASS OF WATER ON AN EMPTY STOMACH. 12 tablet 3   amLODipine (NORVASC) 10 MG tablet Take 1 tablet (10 mg total) by mouth daily. 90 tablet 0   busPIRone (BUSPAR) 7.5 MG tablet TAKE 1 TABLET BY MOUTH 2 TIMES DAILY. 180 tablet 1   dicyclomine (BENTYL) 10 MG capsule TAKE 1 CAPSULE BY MOUTH 3 TIMES DAILY BEFORE MEALS 270 capsule 1   fenofibrate 160 MG tablet TAKE 1 TABLET BY MOUTH EVERY DAY 90 tablet 0   gabapentin (NEURONTIN) 800 MG tablet Take 1 tablet (800 mg total) by mouth 2 (two) times daily. 60 tablet 0   hydrALAZINE (APRESOLINE) 25 MG tablet TAKE 1 TABLET BY MOUTH THREE TIMES A DAY 270 tablet 1   insulin detemir (LEVEMIR FLEXPEN) 100 UNIT/ML FlexPen Inject 36 Units into the skin at bedtime. 15 mL 11   Insulin Pen Needle (BD PEN NEEDLE NANO U/F) 32G X 4 MM MISC Use one needle daily to inject insulin 30 each 5   levothyroxine (SYNTHROID) 25 MCG tablet TAKE 1 TABLET BY MOUTH EVERY DAY BEFORE BREAKFAST 90 tablet 0   losartan (COZAAR) 100 MG tablet TAKE 1 TABLET BY MOUTH EVERY DAY 90 tablet 3   meclizine (ANTIVERT) 25 MG tablet Take 1 tablet (25 mg total) by mouth 3 (three) times daily as needed for dizziness. 30 tablet 0   meloxicam (MOBIC) 7.5 MG tablet Take 7.5 mg by mouth daily as needed.     metFORMIN (GLUCOPHAGE) 1000 MG  tablet Take 1 tablet (1,000 mg total) by mouth 2 (two) times daily with a meal. 180 tablet 3   metoprolol succinate (TOPROL-XL) 50 MG 24 hr tablet TAKE 1 TABLET BY MOUTH DAILY. TAKE WITH OR IMMEDIATELY FOLLOWING A MEAL. 90 tablet 1   pantoprazole (PROTONIX) 40 MG tablet TAKE 1 TABLET BY MOUTH EVERY DAY 90 tablet 1  venlafaxine XR (EFFEXOR-XR) 75 MG 24 hr capsule TAKE 1 CAPSULE BY MOUTH DAILY WITH BREAKFAST. 90 capsule 2   Vitamin D, Ergocalciferol, (DRISDOL) 1.25 MG (50000 UNIT) CAPS capsule Take 1 capsule (50,000 Units total) by mouth every 7 (seven) days. 8 capsule 0   Current Facility-Administered Medications on File Prior to Visit  Medication Dose Route Frequency Provider Last Rate Last Admin   0.9 %  sodium chloride infusion  500 mL Intravenous Once Jackquline Denmark, MD        BP 130/70   Pulse 68   Resp 18   Ht '5\' 1"'$  (1.549 m)   Wt 108 lb 12.8 oz (49.4 kg)   SpO2 99%   BMI 20.56 kg/m          Objective:   Physical Exam   General Mental Status- Alert. General Appearance- Not in acute distress.   Skin General: Color- Normal Color. Moisture- Normal Moisture.  Neck Carotid Arteries- Normal color. Moisture- Normal Moisture. No carotid bruits. No JVD.  Chest and Lung Exam Auscultation: Breath Sounds:-Normal.  Cardiovascular Auscultation:Rythm- Regular. Murmurs & Other Heart Sounds:Auscultation of the heart reveals- No Murmurs.  Abdomen Inspection:-Inspeection Normal. Palpation/Percussion:Note:No mass. Palpation and Percussion of the abdomen reveal- Non Tender, Non Distended + BS, no rebound or guarding.   Neurologic Cranial Nerve exam:- CN III-XII intact(No nystagmus), symmetric smile. Strength:- 5/5 equal and symmetric strength both upper and lower extremities.       Assessment & Plan:   Patient Instructions  Recent hospitalization in [place].  I do not have records but on discussion it appears that you had severe diarrhea with dehydration and then vasovagal  syncopal type episode.  Diagnosed with C. difficile and symptoms much improved but still having loose stools.  I am placing stool studies to make sure no other cause found and make sure C. difficile has resolved.  Make sure you stay well-hydrated and after turning in stool studies can use Imodium to help with loose stools.  EKG done today since troponin was elevated during the hospitalization and hospital recommended cardiac follow-up.  On review of minimal paperwork from hospitalization there is a note regarding elevated troponin.  Based on the above description I am thinking he might have had severe dehydration and some demand ischemia.  No present chest pain or cardiac signs and symptoms.  Will get 1 set of stat troponin presently.  If that level were to be elevated then she would need to be evaluated emergency department.  If negative then we will go ahead and make referral to cardiologist as hospital [place] recommend cardiac evaluation.  Also known history of stent and new diagnosis of aortic aneurysm.  I do not have imaging studies of abdomen to review.  If you were to get any lifting abdomen pain recommend ED evaluation pending for record review.  Persisting fatigue and reported low magnesium during hospitalization.  Suspect probably had low potassium as well.  Will get metabolic panel, magnesium and fatigue labs.  Follow-up in 7 to 10 days or sooner if needed.    Mackie Pai, PA-C   Time spent with patient today was  51 minutes which consisted of chart revdiew, discussing diagnosis, work up treatment and documentation. Did talk with Dr. Kirkland Hun and reviewed presentation. Sent him ekg to review.

## 2022-05-26 ENCOUNTER — Telehealth (HOSPITAL_COMMUNITY): Payer: Self-pay | Admitting: Pharmacy Technician

## 2022-05-26 ENCOUNTER — Other Ambulatory Visit (HOSPITAL_COMMUNITY): Payer: Self-pay

## 2022-05-26 DIAGNOSIS — E119 Type 2 diabetes mellitus without complications: Secondary | ICD-10-CM

## 2022-05-26 DIAGNOSIS — I10 Essential (primary) hypertension: Secondary | ICD-10-CM

## 2022-05-26 DIAGNOSIS — Z791 Long term (current) use of non-steroidal anti-inflammatories (NSAID): Secondary | ICD-10-CM | POA: Diagnosis not present

## 2022-05-26 DIAGNOSIS — Z833 Family history of diabetes mellitus: Secondary | ICD-10-CM | POA: Diagnosis not present

## 2022-05-26 DIAGNOSIS — Z79899 Other long term (current) drug therapy: Secondary | ICD-10-CM | POA: Diagnosis not present

## 2022-05-26 DIAGNOSIS — Z720 Tobacco use: Secondary | ICD-10-CM | POA: Diagnosis present

## 2022-05-26 DIAGNOSIS — F17210 Nicotine dependence, cigarettes, uncomplicated: Secondary | ICD-10-CM | POA: Diagnosis present

## 2022-05-26 DIAGNOSIS — Z7983 Long term (current) use of bisphosphonates: Secondary | ICD-10-CM | POA: Diagnosis not present

## 2022-05-26 DIAGNOSIS — E860 Dehydration: Secondary | ICD-10-CM

## 2022-05-26 DIAGNOSIS — E785 Hyperlipidemia, unspecified: Secondary | ICD-10-CM

## 2022-05-26 DIAGNOSIS — N179 Acute kidney failure, unspecified: Secondary | ICD-10-CM

## 2022-05-26 DIAGNOSIS — K219 Gastro-esophageal reflux disease without esophagitis: Secondary | ICD-10-CM

## 2022-05-26 DIAGNOSIS — Z79890 Hormone replacement therapy (postmenopausal): Secondary | ICD-10-CM | POA: Diagnosis not present

## 2022-05-26 DIAGNOSIS — M810 Age-related osteoporosis without current pathological fracture: Secondary | ICD-10-CM | POA: Diagnosis present

## 2022-05-26 DIAGNOSIS — A498 Other bacterial infections of unspecified site: Secondary | ICD-10-CM

## 2022-05-26 DIAGNOSIS — Z841 Family history of disorders of kidney and ureter: Secondary | ICD-10-CM | POA: Diagnosis not present

## 2022-05-26 DIAGNOSIS — M797 Fibromyalgia: Secondary | ICD-10-CM | POA: Diagnosis present

## 2022-05-26 DIAGNOSIS — E039 Hypothyroidism, unspecified: Secondary | ICD-10-CM

## 2022-05-26 DIAGNOSIS — Z794 Long term (current) use of insulin: Secondary | ICD-10-CM | POA: Diagnosis not present

## 2022-05-26 DIAGNOSIS — F419 Anxiety disorder, unspecified: Secondary | ICD-10-CM | POA: Diagnosis present

## 2022-05-26 DIAGNOSIS — Z801 Family history of malignant neoplasm of trachea, bronchus and lung: Secondary | ICD-10-CM | POA: Diagnosis not present

## 2022-05-26 DIAGNOSIS — Z7985 Long-term (current) use of injectable non-insulin antidiabetic drugs: Secondary | ICD-10-CM | POA: Diagnosis not present

## 2022-05-26 DIAGNOSIS — Z955 Presence of coronary angioplasty implant and graft: Secondary | ICD-10-CM | POA: Diagnosis not present

## 2022-05-26 DIAGNOSIS — E11649 Type 2 diabetes mellitus with hypoglycemia without coma: Secondary | ICD-10-CM | POA: Diagnosis not present

## 2022-05-26 HISTORY — DX: Hyperlipidemia, unspecified: E78.5

## 2022-05-26 HISTORY — DX: Other bacterial infections of unspecified site: A49.8

## 2022-05-26 HISTORY — DX: Hypothyroidism, unspecified: E03.9

## 2022-05-26 HISTORY — DX: Tobacco use: Z72.0

## 2022-05-26 HISTORY — DX: Hypomagnesemia: E83.42

## 2022-05-26 HISTORY — DX: Acute kidney failure, unspecified: N17.9

## 2022-05-26 HISTORY — DX: Type 2 diabetes mellitus without complications: E11.9

## 2022-05-26 HISTORY — DX: Essential (primary) hypertension: I10

## 2022-05-26 LAB — CBC WITH DIFFERENTIAL/PLATELET
Abs Immature Granulocytes: 0.04 10*3/uL (ref 0.00–0.07)
Basophils Absolute: 0.1 10*3/uL (ref 0.0–0.1)
Basophils Relative: 1 %
Eosinophils Absolute: 0.2 10*3/uL (ref 0.0–0.5)
Eosinophils Relative: 2 %
HCT: 27.2 % — ABNORMAL LOW (ref 36.0–46.0)
Hemoglobin: 8.8 g/dL — ABNORMAL LOW (ref 12.0–15.0)
Immature Granulocytes: 1 %
Lymphocytes Relative: 21 %
Lymphs Abs: 1.5 10*3/uL (ref 0.7–4.0)
MCH: 26.7 pg (ref 26.0–34.0)
MCHC: 32.4 g/dL (ref 30.0–36.0)
MCV: 82.4 fL (ref 80.0–100.0)
Monocytes Absolute: 0.5 10*3/uL (ref 0.1–1.0)
Monocytes Relative: 6 %
Neutro Abs: 4.8 10*3/uL (ref 1.7–7.7)
Neutrophils Relative %: 69 %
Platelets: 350 10*3/uL (ref 150–400)
RBC: 3.3 MIL/uL — ABNORMAL LOW (ref 3.87–5.11)
RDW: 13.8 % (ref 11.5–15.5)
WBC: 7 10*3/uL (ref 4.0–10.5)
nRBC: 0 % (ref 0.0–0.2)

## 2022-05-26 LAB — COMPREHENSIVE METABOLIC PANEL
ALT: 14 U/L (ref 0–44)
AST: 17 U/L (ref 15–41)
Albumin: 3.3 g/dL — ABNORMAL LOW (ref 3.5–5.0)
Alkaline Phosphatase: 33 U/L — ABNORMAL LOW (ref 38–126)
Anion gap: 8 (ref 5–15)
BUN: 31 mg/dL — ABNORMAL HIGH (ref 8–23)
CO2: 19 mmol/L — ABNORMAL LOW (ref 22–32)
Calcium: 9.2 mg/dL (ref 8.9–10.3)
Chloride: 113 mmol/L — ABNORMAL HIGH (ref 98–111)
Creatinine, Ser: 1.08 mg/dL — ABNORMAL HIGH (ref 0.44–1.00)
GFR, Estimated: 56 mL/min — ABNORMAL LOW (ref >60)
Glucose, Bld: 111 mg/dL — ABNORMAL HIGH (ref 70–99)
Potassium: 4.7 mmol/L (ref 3.5–5.1)
Sodium: 140 mmol/L (ref 135–145)
Total Bilirubin: 0.3 mg/dL (ref 0.3–1.2)
Total Protein: 6.2 g/dL — ABNORMAL LOW (ref 6.5–8.1)

## 2022-05-26 LAB — C DIFFICILE QUICK SCREEN W PCR REFLEX
C Diff antigen: NEGATIVE
C Diff interpretation: NOT DETECTED
C Diff toxin: NEGATIVE

## 2022-05-26 LAB — HEMOGLOBIN A1C
Hgb A1c MFr Bld: 6.8 % — ABNORMAL HIGH (ref 4.8–5.6)
Mean Plasma Glucose: 148.46 mg/dL

## 2022-05-26 LAB — GLUCOSE, CAPILLARY
Glucose-Capillary: 122 mg/dL — ABNORMAL HIGH (ref 70–99)
Glucose-Capillary: 141 mg/dL — ABNORMAL HIGH (ref 70–99)
Glucose-Capillary: 159 mg/dL — ABNORMAL HIGH (ref 70–99)

## 2022-05-26 LAB — MAGNESIUM: Magnesium: 1.5 mg/dL — ABNORMAL LOW (ref 1.7–2.4)

## 2022-05-26 MED ORDER — INSULIN DETEMIR 100 UNIT/ML FLEXPEN: 36.0000 [IU] | PEN_INJECTOR | Freq: Every day | SUBCUTANEOUS | Status: DC

## 2022-05-26 MED ORDER — LEVOTHYROXINE SODIUM 25 MCG PO TABS
25.0000 ug | ORAL_TABLET | Freq: Every day | ORAL | Status: DC
  Administered 2022-05-26 – 2022-05-29 (×4): 25 ug via ORAL
  Filled 2022-05-26 (×5): qty 1

## 2022-05-26 MED ORDER — INSULIN DETEMIR 100 UNIT/ML ~~LOC~~ SOLN
36.0000 [IU] | Freq: Every day | SUBCUTANEOUS | Status: DC
  Administered 2022-05-26: 36 [IU] via SUBCUTANEOUS
  Filled 2022-05-26 (×2): qty 0.36

## 2022-05-26 MED ORDER — INSULIN ASPART 100 UNIT/ML IJ SOLN
0.0000 [IU] | Freq: Three times a day (TID) | INTRAMUSCULAR | Status: DC
  Administered 2022-05-26: 1 [IU] via SUBCUTANEOUS
  Administered 2022-05-29: 2 [IU] via SUBCUTANEOUS

## 2022-05-26 MED ORDER — ACETAMINOPHEN 650 MG RE SUPP: 650.0000 mg | Freq: Four times a day (QID) | RECTAL | Status: DC | PRN

## 2022-05-26 MED ORDER — HYDRALAZINE HCL 50 MG PO TABS
100.0000 mg | ORAL_TABLET | Freq: Two times a day (BID) | ORAL | Status: DC
  Administered 2022-05-26 – 2022-05-29 (×7): 100 mg via ORAL
  Filled 2022-05-26 (×7): qty 2

## 2022-05-26 MED ORDER — GABAPENTIN 400 MG PO CAPS
800.0000 mg | ORAL_CAPSULE | Freq: Two times a day (BID) | ORAL | Status: DC
  Administered 2022-05-26 – 2022-05-29 (×7): 800 mg via ORAL
  Filled 2022-05-26 (×7): qty 2

## 2022-05-26 MED ORDER — HYDRALAZINE HCL 20 MG/ML IJ SOLN
10.0000 mg | Freq: Three times a day (TID) | INTRAMUSCULAR | Status: DC | PRN
  Administered 2022-05-28: 10 mg via INTRAVENOUS
  Filled 2022-05-26: qty 1

## 2022-05-26 MED ORDER — HYDRALAZINE HCL 100 MG PO TABS: 100.0000 mg | ORAL_TABLET | Freq: Two times a day (BID) | ORAL | Status: DC

## 2022-05-26 MED ORDER — FIDAXOMICIN 200 MG PO TABS
200.0000 mg | ORAL_TABLET | Freq: Two times a day (BID) | ORAL | Status: DC
  Administered 2022-05-26: 200 mg via ORAL
  Filled 2022-05-26: qty 1

## 2022-05-26 MED ORDER — NICOTINE 14 MG/24HR TD PT24
14.0000 mg | MEDICATED_PATCH | Freq: Every day | TRANSDERMAL | Status: DC
  Filled 2022-05-26 (×3): qty 1

## 2022-05-26 MED ORDER — SODIUM CHLORIDE 0.9 % IV SOLN: INTRAVENOUS | Status: AC

## 2022-05-26 MED ORDER — ACETAMINOPHEN 325 MG PO TABS
650.0000 mg | ORAL_TABLET | Freq: Four times a day (QID) | ORAL | Status: DC | PRN
  Administered 2022-05-26: 650 mg via ORAL
  Filled 2022-05-26: qty 2

## 2022-05-26 MED ORDER — HYDROCODONE-ACETAMINOPHEN 5-325 MG PO TABS: 1.0000 | ORAL_TABLET | ORAL | Status: DC | PRN

## 2022-05-26 MED ORDER — MAGNESIUM SULFATE 2 GM/50ML IV SOLN
2.0000 g | Freq: Once | INTRAVENOUS | Status: AC
  Administered 2022-05-26: 2 g via INTRAVENOUS
  Filled 2022-05-26: qty 50

## 2022-05-26 MED ORDER — ENOXAPARIN SODIUM 40 MG/0.4ML IJ SOSY
40.0000 mg | PREFILLED_SYRINGE | INTRAMUSCULAR | Status: DC
  Administered 2022-05-26 – 2022-05-28 (×3): 40 mg via SUBCUTANEOUS
  Filled 2022-05-26 (×4): qty 0.4

## 2022-05-26 MED ORDER — VENLAFAXINE HCL ER 75 MG PO CP24
75.0000 mg | ORAL_CAPSULE | Freq: Every day | ORAL | Status: DC
  Administered 2022-05-26 – 2022-05-29 (×4): 75 mg via ORAL
  Filled 2022-05-26 (×4): qty 1

## 2022-05-26 MED ORDER — AMLODIPINE BESYLATE 10 MG PO TABS
10.0000 mg | ORAL_TABLET | Freq: Every day | ORAL | Status: DC
  Administered 2022-05-26 – 2022-05-29 (×4): 10 mg via ORAL
  Filled 2022-05-26 (×4): qty 1

## 2022-05-26 MED ORDER — FENOFIBRATE 160 MG PO TABS
160.0000 mg | ORAL_TABLET | Freq: Every day | ORAL | Status: DC
  Administered 2022-05-26 – 2022-05-29 (×4): 160 mg via ORAL
  Filled 2022-05-26 (×4): qty 1

## 2022-05-26 MED ORDER — BUSPIRONE HCL 5 MG PO TABS
7.5000 mg | ORAL_TABLET | Freq: Two times a day (BID) | ORAL | Status: DC
  Administered 2022-05-26 – 2022-05-29 (×7): 7.5 mg via ORAL
  Filled 2022-05-26 (×7): qty 2

## 2022-05-26 NOTE — H&P (Signed)
History and Physical    Patient: Jasmine Keith YPP:[id number] DOB: Jan 06, 1951 DOA: 05/25/2022 DOS: the patient was seen and examined on 05/26/2022 PCP: Mackie Pai, PA-C  Patient coming from: Home  Chief Complaint:  Chief Complaint  Patient presents with   Fatigue   HPI: [name] is a 71 y.o. female with medical history significant of HTN, hypothyroidism, GERD, DM2. Presenting generalized weakness and abnormal labs. She was recently hospitalized in [place] for c diff infection and dehydration. She stay for a week and was discharged to home with a few more days of vancomycin. She says her N/V resolved during that time, but she still had diarrhea. She completed her vancomycin a few days ago. Her diarrhea has not resolved. In fact, it has worsened. She feels more fatigued. She has not had any fevers, N/V, abdominal pain. She decided to see her PCP yesterday. During her w/u, it was noted that she was severely hypomagnesemic. It was recommended that she go to the ED for assistance. She denies any other aggravating or alleviating factors.    Review of Systems: As mentioned in the history of present illness. All other systems reviewed and are negative. Past Medical History:  Diagnosis Date   Anxiety    Diabetes mellitus without complication (HCC)    Fibromyalgia    GERD (gastroesophageal reflux disease)    Hyperlipidemia    Hypertension    Osteoporosis    Thyroid disease    Past Surgical History:  Procedure Laterality Date   coronary stent placed  1993   Social History:  reports that she has been smoking cigarettes. She has been smoking an average of .5 packs per day. She has never used smokeless tobacco. She reports current alcohol use. She reports that she does not currently use drugs.  No Known Allergies  Family History  Problem Relation Age of Onset   Diabetes Mother    Kidney disease Mother    Diabetes Father    Cancer Sister        lung   Kidney disease Sister     Thyroid disease Neg Hx    Colon cancer Neg Hx    Esophageal cancer Neg Hx    Rectal cancer Neg Hx    Stomach cancer Neg Hx     Prior to Admission medications   Medication Sig Start Date End Date Taking? Authorizing Provider  alendronate (FOSAMAX) 70 MG tablet TAKE 1 TABLET BY MOUTH ONCE A WEEK. TAKE WITH A FULL GLASS OF WATER ON AN EMPTY STOMACH. 07/05/21   Saguier, Percell Miller, PA-C  amLODipine (NORVASC) 10 MG tablet Take 1 tablet (10 mg total) by mouth daily. 04/24/22   Saguier, Percell Miller, PA-C  busPIRone (BUSPAR) 7.5 MG tablet TAKE 1 TABLET BY MOUTH 2 TIMES DAILY. 03/03/22   Saguier, Percell Miller, PA-C  dicyclomine (BENTYL) 10 MG capsule TAKE 1 CAPSULE BY MOUTH 3 TIMES DAILY BEFORE MEALS 05/22/22   Saguier, Percell Miller, PA-C  fenofibrate 160 MG tablet TAKE 1 TABLET BY MOUTH EVERY DAY 05/08/22 08/06/22  Saguier, Percell Miller, PA-C  gabapentin (NEURONTIN) 800 MG tablet Take 1 tablet (800 mg total) by mouth 2 (two) times daily. 04/24/22   Saguier, Percell Miller, PA-C  hydrALAZINE (APRESOLINE) 25 MG tablet TAKE 1 TABLET BY MOUTH THREE TIMES A DAY 05/19/22   Saguier, Percell Miller, PA-C  insulin detemir (LEVEMIR FLEXPEN) 100 UNIT/ML FlexPen Inject 36 Units into the skin at bedtime. 12/21/21   Saguier, Percell Miller, PA-C  Insulin Pen Needle (BD PEN NEEDLE NANO U/F) 32G X 4  MM MISC Use one needle daily to inject insulin 12/23/21   Saguier, Percell Miller, PA-C  levothyroxine (SYNTHROID) 25 MCG tablet TAKE 1 TABLET BY MOUTH EVERY DAY BEFORE BREAKFAST 04/24/22   Saguier, Percell Miller, PA-C  losartan (COZAAR) 100 MG tablet TAKE 1 TABLET BY MOUTH EVERY DAY 12/07/21   Saguier, Percell Miller, PA-C  meclizine (ANTIVERT) 25 MG tablet Take 1 tablet (25 mg total) by mouth 3 (three) times daily as needed for dizziness. 03/02/21   Saguier, Percell Miller, PA-C  meloxicam (MOBIC) 7.5 MG tablet Take 7.5 mg by mouth daily as needed. 07/07/20   [provider]  metFORMIN (GLUCOPHAGE) 1000 MG tablet Take 1 tablet (1,000 mg total) by mouth 2 (two) times daily with a meal. 12/23/21   Saguier,  Percell Miller, PA-C  metoprolol succinate (TOPROL-XL) 50 MG 24 hr tablet TAKE 1 TABLET BY MOUTH DAILY. TAKE WITH OR IMMEDIATELY FOLLOWING A MEAL. 02/27/22   Saguier, Percell Miller, PA-C  pantoprazole (PROTONIX) 40 MG tablet TAKE 1 TABLET BY MOUTH EVERY DAY 05/19/22   Saguier, Percell Miller, PA-C  venlafaxine XR (EFFEXOR-XR) 75 MG 24 hr capsule TAKE 1 CAPSULE BY MOUTH DAILY WITH BREAKFAST. 01/03/22   Saguier, Percell Miller, PA-C  Vitamin D, Ergocalciferol, (DRISDOL) 1.25 MG (50000 UNIT) CAPS capsule Take 1 capsule (50,000 Units total) by mouth every 7 (seven) days. 10/07/21   Mackie Pai, PA-C    Physical Exam: Vitals:   05/25/22 2336 05/25/22 2345 05/26/22 0054 05/26/22 0512  BP:  (!) 154/52 (!) 149/60 (!) 163/59  Pulse:  66 65 65  Resp:  '19 14 14  '$ Temp: 97.9 F (36.6 C)  98.4 F (36.9 C) 98.2 F (36.8 C)  TempSrc: Oral  Oral Oral  SpO2:  99% 96% 96%  Weight:   50.3 kg   Height:       General: 71 y.o. female resting in bed in NAD Eyes: PERRL, normal sclera ENMT: Nares patent w/o discharge, orophaynx clear, dentition normal, ears w/o discharge/lesions/ulcers Neck: Supple, trachea midline Cardiovascular: RRR, +S1, S2, no m/g/r, equal pulses throughout Respiratory: CTABL, no w/r/r, normal WOB GI: BS+, NDNT, no masses noted, no organomegaly noted MSK: No e/c/c Neuro: A&O x 3, no focal deficits Psyc: Appropriate interaction and affect, calm/cooperative  Data Reviewed:    Lab Results  Component Value Date   NA 140 05/26/2022   K 4.7 05/26/2022   CO2 19 (L) 05/26/2022   GLUCOSE 111 (H) 05/26/2022   BUN 31 (H) 05/26/2022   CREATININE 1.08 (H) 05/26/2022   CALCIUM 9.2 05/26/2022   GFRNONAA 56 (L) 05/26/2022   Mg2+ 1.5  Lab Results  Component Value Date   WBC 7.0 05/26/2022   HGB 8.8 (L) 05/26/2022   HCT 27.2 (L) 05/26/2022   MCV 82.4 05/26/2022   PLT 350 05/26/2022   Assessment and Plan: Diarrhea C diff infection     - admit to inpt, tele     - c diff pending, check GI PCR     - start  dificid     - fluids  AKI     - secondary to dehydration     - fluids     - watch nephrotoxins  Hypomagnesemia     - continue replacement  DM2     - A1c, SSI, DM diet  Anxiety     - continue home regimen  GERD     - hold home regimen  HTN     - hold her home ARB; resume home regimen otherwise  HLD     -  resume home regimen  Hypothyroidism     - resume home regimen  Tobacco abuse     - counsel against further use     - nicotine patch  Advance Care Planning:   Code Status: FULL  Consults: None at bedside  Family Communication: None at bedside  Severity of Illness: The appropriate patient status for this patient is INPATIENT. Inpatient status is judged to be reasonable and necessary in order to provide the required intensity of service to ensure the patient's safety. The patient's presenting symptoms, physical exam findings, and initial radiographic and laboratory data in the context of their chronic comorbidities is felt to place them at high risk for further clinical deterioration. Furthermore, it is not anticipated that the patient will be medically stable for discharge from the hospital within 2 midnights of admission.   * I certify that at the point of admission it is my clinical judgment that the patient will require inpatient hospital care spanning beyond 2 midnights from the point of admission due to high intensity of service, high risk for further deterioration and high frequency of surveillance required.*  Author: Jonnie Finner, DO 05/26/2022 7:07 AM  For on call review [email or web address].

## 2022-05-26 NOTE — ED Notes (Signed)
[facility] at the Bedside. Patient Report/Care Handoff given to Same. All Questions answered.

## 2022-05-26 NOTE — TOC Benefit Eligibility Note (Signed)
Patient Teacher, English as a foreign language completed.    The patient is currently admitted and upon discharge could be taking Dificid 200 mg tablets.  The current 30 day co-pay is, $1,279.95.   The patient is insured through [place], [place] Patient Advocate Specialist [place] Patient Advocate Team Direct Number: [phone]  Fax: [phone]

## 2022-05-26 NOTE — Telephone Encounter (Signed)
Pharmacy Patient Advocate Encounter  Insurance verification completed.    The patient is insured through [employer] Part D   The patient is currently admitted and ran test claims for the following: Dificid.  Copays and coinsurance results were relayed to Inpatient clinical team.

## 2022-05-26 NOTE — Telephone Encounter (Signed)
Patient Advocate Encounter  Patient is approved through the [employer] Patient Assistance Program for Dificid through 11/05/2022  Medication will be mailed to patient's home.      Lyndel Safe, [place] Patient Advocate Specialist [place] Patient Advocate Team Direct Number: [phone]  Fax: [phone]

## 2022-05-27 DIAGNOSIS — E860 Dehydration: Secondary | ICD-10-CM | POA: Diagnosis not present

## 2022-05-27 LAB — GASTROINTESTINAL PANEL BY PCR, STOOL (REPLACES STOOL CULTURE)

## 2022-05-27 LAB — COMPREHENSIVE METABOLIC PANEL
ALT: 15 U/L (ref 0–44)
AST: 23 U/L (ref 15–41)
Albumin: 3.4 g/dL — ABNORMAL LOW (ref 3.5–5.0)
Alkaline Phosphatase: 33 U/L — ABNORMAL LOW (ref 38–126)
Anion gap: 6 (ref 5–15)
BUN: 17 mg/dL (ref 8–23)
CO2: 21 mmol/L — ABNORMAL LOW (ref 22–32)
Calcium: 8.7 mg/dL — ABNORMAL LOW (ref 8.9–10.3)
Chloride: 115 mmol/L — ABNORMAL HIGH (ref 98–111)
Creatinine, Ser: 0.91 mg/dL (ref 0.44–1.00)
GFR, Estimated: >60 mL/min (ref >60)
Glucose, Bld: 43 mg/dL — CL (ref 70–99)
Potassium: 3.9 mmol/L (ref 3.5–5.1)
Sodium: 142 mmol/L (ref 135–145)
Total Bilirubin: 0.2 mg/dL — ABNORMAL LOW (ref 0.3–1.2)
Total Protein: 6.5 g/dL (ref 6.5–8.1)

## 2022-05-27 LAB — GLUCOSE, CAPILLARY
Glucose-Capillary: 43 mg/dL — CL (ref 70–99)
Glucose-Capillary: 45 mg/dL — ABNORMAL LOW (ref 70–99)
Glucose-Capillary: 139 mg/dL — ABNORMAL HIGH (ref 70–99)
Glucose-Capillary: 79 mg/dL (ref 70–99)
Glucose-Capillary: 130 mg/dL — ABNORMAL HIGH (ref 70–99)
Glucose-Capillary: 167 mg/dL — ABNORMAL HIGH (ref 70–99)

## 2022-05-27 LAB — MAGNESIUM: Magnesium: 1.6 mg/dL — ABNORMAL LOW (ref 1.7–2.4)

## 2022-05-27 LAB — CBC
HCT: 28.2 % — ABNORMAL LOW (ref 36.0–46.0)
Hemoglobin: 9.2 g/dL — ABNORMAL LOW (ref 12.0–15.0)
MCH: 27.1 pg (ref 26.0–34.0)
MCHC: 32.6 g/dL (ref 30.0–36.0)
MCV: 82.9 fL (ref 80.0–100.0)
Platelets: 334 10*3/uL (ref 150–400)
RBC: 3.4 MIL/uL — ABNORMAL LOW (ref 3.87–5.11)
RDW: 13.8 % (ref 11.5–15.5)
WBC: 7.7 10*3/uL (ref 4.0–10.5)
nRBC: 0 % (ref 0.0–0.2)

## 2022-05-27 MED ORDER — LOPERAMIDE HCL 2 MG PO CAPS
2.0000 mg | ORAL_CAPSULE | ORAL | Status: DC | PRN
Start: 2022-05-27 — End: 2022-05-29

## 2022-05-27 MED ORDER — LOPERAMIDE HCL 2 MG PO CAPS
4.0000 mg | ORAL_CAPSULE | Freq: Every day | ORAL | Status: DC
  Administered 2022-05-27: 4 mg via ORAL
  Filled 2022-05-27 (×3): qty 2

## 2022-05-27 MED ORDER — INSULIN DETEMIR 100 UNIT/ML ~~LOC~~ SOLN
25.0000 [IU] | Freq: Every day | SUBCUTANEOUS | Status: DC
  Administered 2022-05-27: 25 [IU] via SUBCUTANEOUS
  Filled 2022-05-27 (×2): qty 0.25

## 2022-05-28 DIAGNOSIS — E860 Dehydration: Secondary | ICD-10-CM | POA: Diagnosis not present

## 2022-05-28 LAB — CBC WITH DIFFERENTIAL/PLATELET
Abs Immature Granulocytes: 0.02 10*3/uL (ref 0.00–0.07)
Basophils Absolute: 0 10*3/uL (ref 0.0–0.1)
Basophils Relative: 1 %
Eosinophils Absolute: 0.1 10*3/uL (ref 0.0–0.5)
Eosinophils Relative: 2 %
HCT: 28.7 % — ABNORMAL LOW (ref 36.0–46.0)
Hemoglobin: 9.3 g/dL — ABNORMAL LOW (ref 12.0–15.0)
Immature Granulocytes: 0 %
Lymphocytes Relative: 22 %
Lymphs Abs: 1.3 10*3/uL (ref 0.7–4.0)
MCH: 27 pg (ref 26.0–34.0)
MCHC: 32.4 g/dL (ref 30.0–36.0)
MCV: 83.4 fL (ref 80.0–100.0)
Monocytes Absolute: 0.4 10*3/uL (ref 0.1–1.0)
Monocytes Relative: 7 %
Neutro Abs: 4.1 10*3/uL (ref 1.7–7.7)
Neutrophils Relative %: 68 %
Platelets: 318 10*3/uL (ref 150–400)
RBC: 3.44 MIL/uL — ABNORMAL LOW (ref 3.87–5.11)
RDW: 14.2 % (ref 11.5–15.5)
WBC: 6 10*3/uL (ref 4.0–10.5)
nRBC: 0 % (ref 0.0–0.2)

## 2022-05-28 LAB — COMPREHENSIVE METABOLIC PANEL
ALT: 16 U/L (ref 0–44)
AST: 27 U/L (ref 15–41)
Albumin: 3.5 g/dL (ref 3.5–5.0)
Alkaline Phosphatase: 33 U/L — ABNORMAL LOW (ref 38–126)
Anion gap: 6 (ref 5–15)
BUN: 12 mg/dL (ref 8–23)
CO2: 20 mmol/L — ABNORMAL LOW (ref 22–32)
Calcium: 8.9 mg/dL (ref 8.9–10.3)
Chloride: 118 mmol/L — ABNORMAL HIGH (ref 98–111)
Creatinine, Ser: 0.89 mg/dL (ref 0.44–1.00)
GFR, Estimated: >60 mL/min (ref >60)
Glucose, Bld: 31 mg/dL — CL (ref 70–99)
Potassium: 3.7 mmol/L (ref 3.5–5.1)
Sodium: 144 mmol/L (ref 135–145)
Total Bilirubin: 0.4 mg/dL (ref 0.3–1.2)
Total Protein: 6.5 g/dL (ref 6.5–8.1)

## 2022-05-28 LAB — URINALYSIS, ROUTINE W REFLEX MICROSCOPIC
Bilirubin Urine: NEGATIVE
Glucose, UA: 50 mg/dL — AB
Ketones, ur: NEGATIVE mg/dL
Nitrite: NEGATIVE
Protein, ur: 100 mg/dL — AB
RBC / HPF: >50 RBC/hpf — ABNORMAL HIGH (ref 0–5)
Specific Gravity, Urine: 1.015 (ref 1.005–1.030)
WBC, UA: >50 WBC/hpf — ABNORMAL HIGH (ref 0–5)
pH: 6 (ref 5.0–8.0)

## 2022-05-28 LAB — GLUCOSE, CAPILLARY
Glucose-Capillary: 92 mg/dL (ref 70–99)
Glucose-Capillary: 201 mg/dL — ABNORMAL HIGH (ref 70–99)
Glucose-Capillary: 27 mg/dL — CL (ref 70–99)
Glucose-Capillary: 113 mg/dL — ABNORMAL HIGH (ref 70–99)
Glucose-Capillary: 155 mg/dL — ABNORMAL HIGH (ref 70–99)

## 2022-05-28 LAB — HIV ANTIBODY (ROUTINE TESTING W REFLEX): HIV Screen 4th Generation wRfx: NONREACTIVE

## 2022-05-28 MED ORDER — SODIUM CHLORIDE 0.9 % IV SOLN: INTRAVENOUS | Status: DC

## 2022-05-28 MED ORDER — METOPROLOL TARTRATE 25 MG PO TABS
25.0000 mg | ORAL_TABLET | Freq: Two times a day (BID) | ORAL | Status: DC
  Administered 2022-05-28 – 2022-05-29 (×3): 25 mg via ORAL
  Filled 2022-05-28 (×3): qty 1

## 2022-05-28 MED ORDER — INSULIN DETEMIR 100 UNIT/ML ~~LOC~~ SOLN
10.0000 [IU] | Freq: Every day | SUBCUTANEOUS | Status: DC
  Administered 2022-05-28: 10 [IU] via SUBCUTANEOUS
  Filled 2022-05-28 (×2): qty 0.1

## 2022-05-28 MED ORDER — MAGNESIUM SULFATE 4 GM/100ML IV SOLN
4.0000 g | Freq: Once | INTRAVENOUS | Status: AC
  Administered 2022-05-28: 4 g via INTRAVENOUS
  Filled 2022-05-28: qty 100

## 2022-05-28 MED ORDER — DEXTROSE 50 % IV SOLN
INTRAVENOUS | Status: AC
  Administered 2022-05-28: 50 mL
  Filled 2022-05-28: qty 50

## 2022-05-28 NOTE — Assessment & Plan Note (Signed)
Nicotine patch will be made available.

## 2022-05-28 NOTE — Assessment & Plan Note (Signed)
Continue statin as at home.

## 2022-05-28 NOTE — Assessment & Plan Note (Addendum)
Resolved. Monitor volume status.

## 2022-05-28 NOTE — Assessment & Plan Note (Signed)
Continue Synthroid as at home.

## 2022-05-28 NOTE — Progress Notes (Signed)
PROGRESS NOTE  Jasmine Keith IEP:[id number] DOB: [birth year] DOA: 05/25/2022 PCP: Mackie Pai, PA-C  Brief History   Jasmine Keith is a 71 y.o. female with medical history significant of HTN, hypothyroidism, GERD, DM2. Presenting generalized weakness and abnormal labs. She was recently hospitalized in [place] for c diff infection and dehydration. She stay for a week and was discharged to home with a few more days of vancomycin. She says her N/V resolved during that time, but she still had diarrhea. She completed her vancomycin a few days ago. Her diarrhea has not resolved. In fact, it has worsened. She feels more fatigued. She has not had any fevers, N/V, abdominal pain. She decided to see her PCP yesterday. During her w/u, it was noted that she was severely hypomagnesemic. It was recommended that she go to the ED for assistance. She denies any other aggravating or alleviating factors.     In the ED the patient was found to be dehydrated with AKI, and a magnesium of 0.8. She was given IV fluids and magnesium was supplemented. She was continued on Dificid. Stool studies were obtained. These ruled out ongoing C Diff infection. Dificid has been stopped. She has had no further diarrhea.   The patient was admitted to a telemetry bed for further evaluation and treatment.  She was continued on Lantus 36 units daily upon admission. She was hypoglycemic into the 30's this morning.  Consultants  None  Procedures  None Antibiotics   Anti-infectives (From admission, onward)    Start     Dose/Rate Route Frequency Ordered Stop   05/26/22 1000  fidaxomicin (DIFICID) tablet 200 mg  Status:  Discontinued        200 mg Oral 2 times daily 05/26/22 0739 05/26/22 1354      Subjective  The patient is resting comfortably. No new complaints.   Objective   Vitals:  Vitals:   05/28/22 0613 05/28/22 1335  BP: (!) 189/56 (!) 170/54  Pulse: 97 81  Resp: 20 18  Temp: 98.4 F (36.9 C) 98.1 F (36.7  C)  SpO2: 96% 100%    Exam:  Constitutional:  The patient is awake, alert, and oriented x 3. No acute distress. Respiratory:  No increased work of breathing. No wheezes, rales, or rhonchi No tactile fremitus Cardiovascular:  Regular rate and rhythm No murmurs, ectopy, or gallups. No lateral PMI. No thrills. Abdomen:  Abdomen is soft, non-tender, non-distended No hernias, masses, or organomegaly Normoactive bowel sounds.  Musculoskeletal:  No cyanosis, clubbing, or edema Skin:  No rashes, lesions, ulcers palpation of skin: no induration or nodules Neurologic:  CN 2-12 intact Sensation all 4 extremities intact Psychiatric:  Mental status Mood, affect appropriate Orientation to person, place, time  judgment and insight appear intact   I have personally reviewed the following:   Today's Data  Vitals  Lab Data  CBC BMP Glucoses  Micro Data  Stool studies  Imaging    Cardiology Data    Other Data    Scheduled Meds:  amLODipine  10 mg Oral Daily   busPIRone  7.5 mg Oral BID   enoxaparin (LOVENOX) injection  40 mg Subcutaneous Q24H   fenofibrate  160 mg Oral Daily   gabapentin  800 mg Oral BID   hydrALAZINE  100 mg Oral BID   insulin aspart  0-9 Units Subcutaneous TID WC   insulin detemir  10 Units Subcutaneous QHS   levothyroxine  25 mcg Oral QAC breakfast   loperamide  4  mg Oral Daily   metoprolol tartrate  25 mg Oral BID   nicotine  14 mg Transdermal Daily   venlafaxine XR  75 mg Oral Q breakfast   Continuous Infusions:  Principal Problem:   Dehydration Active Problems:   Clostridioides difficile infection   AKI (acute kidney injury) ([place])   Hypomagnesemia   DM2 (diabetes mellitus, type 2) (HCC)   Anxiety   GERD (gastroesophageal reflux disease)   HTN (hypertension)   HLD (hyperlipidemia)   Hypothyroidism   Tobacco abuse   LOS: 2 days   A & P  Assessment and Plan: * Dehydration Continue IV hydration. Monitor volume  status.  Tobacco abuse Nicotine patch will be made available.  Hypothyroidism Continue Synthroid as at home.  HLD (hyperlipidemia) Continue statin as at home.  HTN (hypertension) Start hydralazine 100 mg bid.   GERD (gastroesophageal reflux disease) Continue protonix.  Anxiety Noted. Anxiolytics will be available on an as needed basis.   DM2 (diabetes mellitus, type 2) ([place]) Initially the patient was given 36 units of lantus sub Q and FSBS with SSI. She was hypoglycemic on the following morning. The lantus was reduced to 25 units.   Hypomagnesemia Magnesium was 0.8 on admission due to GI losses. It was improved to 1.5 this morning. Continue to supplement.  AKI (acute kidney injury) ([place]) Baseline creatinine is 1.0. Creatinine today is 1.61. Continue IV fluids. Avoid nephrotoxic medications and hypotension. Monitor creatinine and electrolytes.   Clostridioides difficile infection Ruled out. Negative by PCR and toxin. Dificid stopped. The patient states that she has had no further episodes of diarrhea.    I have seen and examined this patient myself. I have spent 34 minutes in her evaluation and treatment.  DVT prophylaxis: Lovenox Code Status: Full Code Family Communication: None available Disposition Plan: Home    Jasmine Leckey, DO [facility] Direct contact: see [email or web address]  7PM-7AM contact night coverage as above 05/27/2022, 4:26 PM  LOS: 2 days

## 2022-05-28 NOTE — Assessment & Plan Note (Signed)
Ruled out. Negative by PCR and toxin. Dificid stopped. The patient states that she has had no further episodes of diarrhea.

## 2022-05-28 NOTE — Assessment & Plan Note (Addendum)
The patient remains hypertensive.Have restarted home dose of metoprolol.

## 2022-05-28 NOTE — Assessment & Plan Note (Addendum)
Noted. Anxiolytics will be available on an as needed basis. She is on Buspar.

## 2022-05-28 NOTE — Progress Notes (Signed)
PROGRESS NOTE  Jasmine Keith KGU:[id number] DOB: 12-20-1950 DOA: 05/25/2022 PCP: Mackie Pai, PA-C  Brief History   Jasmine Keith is a 71 y.o. female with medical history significant of HTN, hypothyroidism, GERD, DM2. Presenting generalized weakness and abnormal labs. She was recently hospitalized in [place] for c diff infection and dehydration. She stay for a week and was discharged to home with a few more days of vancomycin. She says her N/V resolved during that time, but she still had diarrhea. She completed her vancomycin a few days ago. Her diarrhea has not resolved. In fact, it has worsened. She feels more fatigued. She has not had any fevers, N/V, abdominal pain. She decided to see her PCP yesterday. During her w/u, it was noted that she was severely hypomagnesemic. It was recommended that she go to the ED for assistance. She denies any other aggravating or alleviating factors.     In the ED the patient was found to be dehydrated with AKI, and a magnesium of 0.8. She was given IV fluids and magnesium was supplemented. She was continued on Dificid. Stool studies were obtained. These ruled out ongoing C Diff infection. Dificid has been stopped. She has had no further diarrhea.   The patient was admitted to a telemetry bed for further evaluation and treatment.  She was continued on Lantus 36 units daily upon admission. She was hypoglycemic into the 30's yesterday morning. Lantus was reduced to 25 units. She was again hypoglycemic into the 40's this morning. Will reduce lantus again to 10 units. The patient states that she has not been eating well, because she does not like the food.   Magnesium is still low this am. Will supplement.  Consultants  None  Procedures  None Antibiotics   Anti-infectives (From admission, onward)    Start     Dose/Rate Route Frequency Ordered Stop   05/26/22 1000  fidaxomicin (DIFICID) tablet 200 mg  Status:  Discontinued        200 mg Oral 2 times  daily 05/26/22 0739 05/26/22 1354      Subjective  The patient is resting comfortably. No new complaints. Spouse is at bedside.  Objective   Vitals:  Vitals:   05/28/22 0613 05/28/22 1335  BP: (!) 189/56 (!) 170/54  Pulse: 97 81  Resp: 20 18  Temp: 98.4 F (36.9 C) 98.1 F (36.7 C)  SpO2: 96% 100%    Exam:  Constitutional:  The patient is awake, alert, and oriented x 3. No acute distress. Respiratory:  No increased work of breathing. No wheezes, rales, or rhonchi No tactile fremitus Cardiovascular:  Regular rate and rhythm No murmurs, ectopy, or gallups. No lateral PMI. No thrills. Abdomen:  Abdomen is soft, non-tender, non-distended No hernias, masses, or organomegaly Normoactive bowel sounds.  Musculoskeletal:  No cyanosis, clubbing, or edema Skin:  No rashes, lesions, ulcers palpation of skin: no induration or nodules Neurologic:  CN 2-12 intact Sensation all 4 extremities intact Psychiatric:  Mental status Mood, affect appropriate Orientation to person, place, time  judgment and insight appear intact   I have personally reviewed the following:   Today's Data  Vitals  Lab Data  CBC BMP Glucoses  Micro Data  Stool studies  Imaging    Cardiology Data    Other Data    Scheduled Meds:  amLODipine  10 mg Oral Daily   busPIRone  7.5 mg Oral BID   enoxaparin (LOVENOX) injection  40 mg Subcutaneous Q24H   fenofibrate  160  mg Oral Daily   gabapentin  800 mg Oral BID   hydrALAZINE  100 mg Oral BID   insulin aspart  0-9 Units Subcutaneous TID WC   insulin detemir  10 Units Subcutaneous QHS   levothyroxine  25 mcg Oral QAC breakfast   loperamide  4 mg Oral Daily   metoprolol tartrate  25 mg Oral BID   nicotine  14 mg Transdermal Daily   venlafaxine XR  75 mg Oral Q breakfast   Principal Problem:   Dehydration Active Problems:   Clostridioides difficile infection   AKI (acute kidney injury) ([place])   Hypomagnesemia   DM2 (diabetes  mellitus, type 2) (HCC)   Anxiety   GERD (gastroesophageal reflux disease)   HTN (hypertension)   HLD (hyperlipidemia)   Hypothyroidism   Tobacco abuse   LOS: 2 days   A & P  Assessment and Plan: * Dehydration Resolved. Monitor volume status.  Tobacco abuse Nicotine patch will be made available.  Hypothyroidism Continue Synthroid as at home.  HLD (hyperlipidemia) Continue statin as at home.  HTN (hypertension) The patient remains hypertensive. Will add metoprolol 25 mg bid. Monitor.  GERD (gastroesophageal reflux disease) Continue protonix.  Anxiety Noted. Anxiolytics will be available on an as needed basis. She is on Buspar.  DM2 (diabetes mellitus, type 2) ([place]) Initially the patient was given 36 units of lantus sub Q and FSBS with SSI. She was hypoglycemic on the following morning. The lantus was reduced to 25 units. This morning she was again hypoglycemic into the 40's. Lantus will again be reduced. This time it will be reduced to 10 units. The patient states that she has not been eating well, because she does not like the food.  Hypomagnesemia Magnesium was 0.8 on admission due to GI losses. It was improved to 1.6 this morning. Continue to supplement.  AKI (acute kidney injury) ([place]) Baseline creatinine is 1.0. Creatinine today is improved to 0.89.  Avoid nephrotoxic medications and hypotension. Monitor creatinine and electrolytes.   Clostridioides difficile infection Ruled out. Negative by PCR and toxin. Dificid stopped. The patient states that she has had no further episodes of diarrhea.    I have seen and examined this patient myself. I have spent 32 minutes in her evaluation and treatment.  DVT prophylaxis: Lovenox Code Status: Full Code Family Communication: None available Disposition Plan: Home    Braxston Quinter, DO [facility] Direct contact: see [email or web address]  7PM-7AM contact night coverage as above 05/28/2022, 6:49 PM  LOS: 2 days

## 2022-05-28 NOTE — Assessment & Plan Note (Addendum)
Initially the patient was given 36 units of lantus sub Q and FSBS with SSI. She was hypoglycemic on the following morning. The lantus was reduced to 25 units. On the morning of 05/28/2022 she was again hypoglycemic into the 40's. Lantus will again be reduced. This time it will be reduced to 10 units. The patient states that she has not been eating well, because she does not like the food.  Glucose this morning was 75. No no hypoglycemia. The patient will be discharged on Lantus 10 units daily.

## 2022-05-28 NOTE — Assessment & Plan Note (Addendum)
Baseline creatinine is 1.0. Creatinine today is improved to 0.98.  Avoid nephrotoxic medications and hypotension.

## 2022-05-28 NOTE — Assessment & Plan Note (Signed)
Continue protonix  

## 2022-05-28 NOTE — Assessment & Plan Note (Addendum)
Magnesium is now within normal limits. Monitor.

## 2022-05-29 ENCOUNTER — Other Ambulatory Visit (HOSPITAL_COMMUNITY): Payer: Self-pay

## 2022-05-29 ENCOUNTER — Other Ambulatory Visit: Payer: Self-pay

## 2022-05-29 DIAGNOSIS — E860 Dehydration: Secondary | ICD-10-CM | POA: Diagnosis not present

## 2022-05-29 LAB — CBC WITH DIFFERENTIAL/PLATELET
Abs Immature Granulocytes: 0.02 10*3/uL (ref 0.00–0.07)
Basophils Absolute: 0 10*3/uL (ref 0.0–0.1)
Basophils Relative: 1 %
Eosinophils Absolute: 0.1 10*3/uL (ref 0.0–0.5)
Eosinophils Relative: 2 %
HCT: 28.5 % — ABNORMAL LOW (ref 36.0–46.0)
Hemoglobin: 9.3 g/dL — ABNORMAL LOW (ref 12.0–15.0)
Immature Granulocytes: 0 %
Lymphocytes Relative: 19 %
Lymphs Abs: 1 10*3/uL (ref 0.7–4.0)
MCH: 27 pg (ref 26.0–34.0)
MCHC: 32.6 g/dL (ref 30.0–36.0)
MCV: 82.6 fL (ref 80.0–100.0)
Monocytes Absolute: 0.4 10*3/uL (ref 0.1–1.0)
Monocytes Relative: 7 %
Neutro Abs: 3.7 10*3/uL (ref 1.7–7.7)
Neutrophils Relative %: 71 %
Platelets: 306 10*3/uL (ref 150–400)
RBC: 3.45 MIL/uL — ABNORMAL LOW (ref 3.87–5.11)
RDW: 14.1 % (ref 11.5–15.5)
WBC: 5.2 10*3/uL (ref 4.0–10.5)
nRBC: 0 % (ref 0.0–0.2)

## 2022-05-29 LAB — BASIC METABOLIC PANEL
Anion gap: 5 (ref 5–15)
BUN: 15 mg/dL (ref 8–23)
CO2: 22 mmol/L (ref 22–32)
Calcium: 9 mg/dL (ref 8.9–10.3)
Chloride: 113 mmol/L — ABNORMAL HIGH (ref 98–111)
Creatinine, Ser: 0.98 mg/dL (ref 0.44–1.00)
GFR, Estimated: >60 mL/min (ref >60)
Glucose, Bld: 75 mg/dL (ref 70–99)
Potassium: 4.2 mmol/L (ref 3.5–5.1)
Sodium: 140 mmol/L (ref 135–145)

## 2022-05-29 LAB — GLUCOSE, CAPILLARY
Glucose-Capillary: 191 mg/dL — ABNORMAL HIGH (ref 70–99)
Glucose-Capillary: 103 mg/dL — ABNORMAL HIGH (ref 70–99)
Glucose-Capillary: 74 mg/dL (ref 70–99)
Glucose-Capillary: 175 mg/dL — ABNORMAL HIGH (ref 70–99)

## 2022-05-29 LAB — MAGNESIUM: Magnesium: 2.4 mg/dL (ref 1.7–2.4)

## 2022-05-29 MED ORDER — MAGNESIUM OXIDE -MG SUPPLEMENT 500 MG PO CAPS
500.0000 mg | ORAL_CAPSULE | Freq: Two times a day (BID) | ORAL | 0 refills | Status: DC
Start: 2022-05-29 — End: 2022-07-19
  Filled 2022-05-29: qty 13, 7d supply, fill #0

## 2022-05-29 MED ORDER — ACETAMINOPHEN 325 MG PO TABS
650.0000 mg | ORAL_TABLET | Freq: Four times a day (QID) | ORAL | 0 refills | Status: DC | PRN
  Filled 2022-05-29: qty 60, 8d supply, fill #0

## 2022-05-29 MED ORDER — NICOTINE 14 MG/24HR TD PT24
14.0000 mg | MEDICATED_PATCH | Freq: Every day | TRANSDERMAL | 0 refills | Status: DC
  Filled 2022-05-29: qty 28, 28d supply, fill #0

## 2022-05-29 MED ORDER — INSULIN DETEMIR 100 UNIT/ML ~~LOC~~ SOLN
10.0000 [IU] | Freq: Every day | SUBCUTANEOUS | 11 refills | Status: DC
  Filled 2022-05-29: qty 10, 90d supply, fill #0

## 2022-05-29 NOTE — Telephone Encounter (Signed)
Prescription sent to [facility]. Pt is currently admitted.

## 2022-05-29 NOTE — Discharge Summary (Signed)
Physician Discharge Summary   Patient: ALOHA Keith MRN: [id number] DOB: 10/05/1951  Admit date:     05/25/2022  Discharge date: 05/29/22  Discharge Physician: Karie Kirks   PCP: Mackie Pai, PA-C   Recommendations at discharge:    Discharge to home Follow up with PCP in 7-10 days. Check glucoses twice daily (once before breakfast, and once before dinner). Record these values and take them in to PCP visit. The patient should have a chemistry checked at PCP visit. Results to be reported to PCP.  Discharge Diagnoses: Principal Problem:   Dehydration Active Problems:   Clostridioides difficile infection   AKI (acute kidney injury) ([place])   Hypomagnesemia   DM2 (diabetes mellitus, type 2) (HCC)   Anxiety   GERD (gastroesophageal reflux disease)   HTN (hypertension)   HLD (hyperlipidemia)   Hypothyroidism   Tobacco abuse  Resolved Problems:   * No resolved hospital problems. *  Hospital Course: Jasmine Keith is a 71 y.o. female with medical history significant of HTN, hypothyroidism, GERD, DM2. Presenting generalized weakness and abnormal labs. She was recently hospitalized in [place] for c diff infection and dehydration. She stay for a week and was discharged to home with a few more days of vancomycin. She says her N/V resolved during that time, but she still had diarrhea. She completed her vancomycin a few days ago. Her diarrhea has not resolved. In fact, it has worsened. She feels more fatigued. She has not had any fevers, N/V, abdominal pain. She decided to see her PCP yesterday. During her w/u, it was noted that she was severely hypomagnesemic. It was recommended that she go to the ED for assistance. She denies any other aggravating or alleviating factors.     In the ED the patient was found to be dehydrated with AKI, and a magnesium of 0.8. She was given IV fluids and magnesium was supplemented. She was continued on Dificid. Stool studies were obtained. These ruled out  ongoing C Diff infection. Dificid has been stopped. She has had no further diarrhea.    The patient was admitted to a telemetry bed for further evaluation and treatment.   She was continued on Lantus 36 units daily upon admission. She was hypoglycemic into the 30's yesterday morning. Lantus was reduced to 25 units. She was again hypoglycemic into the 40's on the morning of the 23 rd. Will reduce lantus again to 10 units. The patient states that she has not been eating well, because she does not like the food. Glucose this morning was 75.   Magnesium and potassium were followed and were supplemented as necessary.   The patient will be discharged to home today in fair condition on lantus 10 units daily.  Assessment and Plan: * Dehydration Resolved. Monitor volume status.  Tobacco abuse Nicotine patch will be made available.  Hypothyroidism Continue Synthroid as at home.  HLD (hyperlipidemia) Continue statin as at home.  HTN (hypertension) The patient remains hypertensive.Have restarted home dose of metoprolol.  GERD (gastroesophageal reflux disease) Continue protonix.  Anxiety Noted. Anxiolytics will be available on an as needed basis. She is on Buspar.  DM2 (diabetes mellitus, type 2) ([place]) Initially the patient was given 36 units of lantus sub Q and FSBS with SSI. She was hypoglycemic on the following morning. The lantus was reduced to 25 units. On the morning of 05/28/2022 she was again hypoglycemic into the 40's. Lantus will again be reduced. This time it will be reduced to 10 units. The patient  states that she has not been eating well, because she does not like the food.  Glucose this morning was 75. No no hypoglycemia. The patient will be discharged on Lantus 10 units daily.  Hypomagnesemia Magnesium is now within normal limits. Monitor.  AKI (acute kidney injury) ([place]) Baseline creatinine is 1.0. Creatinine today is improved to 0.98.  Avoid nephrotoxic medications and  hypotension.   Clostridioides difficile infection Ruled out. Negative by PCR and toxin. Dificid stopped. The patient states that she has had no further episodes of diarrhea.    Consultants: None Procedures performed: None  Disposition: Home Diet recommendation:  Discharge Diet Orders (From admission, onward)     Start     Ordered   05/29/22 0000  Diet Carb Modified        05/29/22 1329           Carb modified diet DISCHARGE MEDICATION: Allergies as of 05/29/2022   No Known Allergies      Medication List     STOP taking these medications    dicyclomine 10 MG capsule Commonly known as: BENTYL   Levemir FlexPen 100 UNIT/ML FlexPen Generic drug: insulin detemir Replaced by: insulin detemir 100 UNIT/ML injection   meclizine 25 MG tablet Commonly known as: ANTIVERT   meloxicam 7.5 MG tablet Commonly known as: MOBIC   metoprolol succinate 50 MG 24 hr tablet Commonly known as: TOPROL-XL   vancomycin 250 MG capsule Commonly known as: VANCOCIN   Vitamin D (Ergocalciferol) 1.25 MG (50000 UNIT) Caps capsule Commonly known as: DRISDOL       TAKE these medications    acetaminophen 325 MG tablet Commonly known as: TYLENOL Take 2 tablets (650 mg total) by mouth every 6 (six) hours as needed for mild pain (or Fever >/= 101).   alendronate 70 MG tablet Commonly known as: FOSAMAX TAKE 1 TABLET BY MOUTH ONCE A WEEK. TAKE WITH A FULL GLASS OF WATER ON AN EMPTY STOMACH. What changed: See the new instructions.   amLODipine 10 MG tablet Commonly known as: NORVASC Take 1 tablet (10 mg total) by mouth daily.   BD Pen Needle Nano U/F 32G X 4 MM Misc Generic drug: Insulin Pen Needle Use one needle daily to inject insulin   busPIRone 7.5 MG tablet Commonly known as: BUSPAR TAKE 1 TABLET BY MOUTH 2 TIMES DAILY.   fenofibrate 160 MG tablet TAKE 1 TABLET BY MOUTH EVERY DAY   gabapentin 800 MG tablet Commonly known as: NEURONTIN Take 1 tablet (800 mg total) by  mouth 2 (two) times daily.   hydrALAZINE 100 MG tablet Commonly known as: APRESOLINE Take 100 mg by mouth 2 (two) times daily. What changed: Another medication with the same name was removed. Continue taking this medication, and follow the directions you see here.   ibuprofen 200 MG tablet Commonly known as: ADVIL Take 600 mg by mouth daily as needed (pain/aches).   insulin detemir 100 UNIT/ML injection Commonly known as: LEVEMIR Inject 0.1 mLs (10 Units total) into the skin at bedtime. Replaces: Levemir FlexPen 100 UNIT/ML FlexPen   levothyroxine 25 MCG tablet Commonly known as: SYNTHROID TAKE 1 TABLET BY MOUTH EVERY DAY BEFORE BREAKFAST What changed:  how much to take how to take this when to take this additional instructions   losartan 100 MG tablet Commonly known as: COZAAR TAKE 1 TABLET BY MOUTH EVERY DAY   Magnesium Oxide -Mg Supplement 500 MG Caps Take 1 capsule (500 mg total) by mouth 2 (two) times daily. For  7 days.   metFORMIN 1000 MG tablet Commonly known as: GLUCOPHAGE Take 1 tablet (1,000 mg total) by mouth 2 (two) times daily with a meal.   metoprolol tartrate 50 MG tablet Commonly known as: LOPRESSOR Take 50 mg by mouth 2 (two) times daily.   nicotine 14 mg/24hr patch Commonly known as: NICODERM CQ - dosed in mg/24 hours Place 1 patch (14 mg total) onto the skin daily. Start taking on: May 30, 2022   pantoprazole 40 MG tablet Commonly known as: PROTONIX TAKE 1 TABLET BY MOUTH EVERY DAY   venlafaxine XR 75 MG 24 hr capsule Commonly known as: EFFEXOR-XR TAKE 1 CAPSULE BY MOUTH DAILY WITH BREAKFAST.        Discharge Exam: Filed Weights   05/25/22 1957 05/26/22 0054  Weight: 49.4 kg 50.3 kg   Vitals:   05/28/22 2034 05/29/22 0259  BP: (!) 177/62 (!) 173/67  Pulse: 71 66  Resp: 18 18  Temp: 98.3 F (36.8 C) 98 F (36.7 C)  SpO2: 97% 97%   Exam:  Constitutional:  The patient is awake, alert, and oriented x 3. No acute  distress. Respiratory:  No increased work of breathing. No wheezes, rales, or rhonchi No tactile fremitus Cardiovascular:  Regular rate and rhythm No murmurs, ectopy, or gallups. No lateral PMI. No thrills. Abdomen:  Abdomen is soft, non-tender, non-distended No hernias, masses, or organomegaly Normoactive bowel sounds.  Musculoskeletal:  No cyanosis, clubbing, or edema Skin:  No rashes, lesions, ulcers palpation of skin: no induration or nodules Neurologic:  CN 2-12 intact Sensation all 4 extremities intact Psychiatric:  Mental status Mood, affect appropriate Orientation to person, place, time  judgment and insight appear intact  Condition at discharge: fair  The results of significant diagnostics from this hospitalization (including imaging, microbiology, ancillary and laboratory) are listed below for reference.   Imaging Studies: No results found.  Microbiology: Results for orders placed or performed during the hospital encounter of 05/25/22  C Difficile Quick Screen w PCR reflex     Status: None   Collection Time: 05/26/22 11:36 AM   Specimen: Stool  Result Value Ref Range Status   C Diff antigen NEGATIVE NEGATIVE Final   C Diff toxin NEGATIVE NEGATIVE Final   C Diff interpretation No C. difficile detected.  Final    Comment: Performed at [facility], [place] [address] [place], [place] [address]  Gastrointestinal Panel by PCR , Stool     Status: None   Collection Time: 05/26/22 11:36 AM   Specimen: Stool  Result Value Ref Range Status   Campylobacter species NOT DETECTED NOT DETECTED Final   Plesimonas shigelloides NOT DETECTED NOT DETECTED Final   Salmonella species NOT DETECTED NOT DETECTED Final   Yersinia enterocolitica NOT DETECTED NOT DETECTED Final   Vibrio species NOT DETECTED NOT DETECTED Final   Vibrio cholerae NOT DETECTED NOT DETECTED Final   Enteroaggregative E coli (EAEC) NOT DETECTED NOT DETECTED Final   Enteropathogenic E  coli (EPEC) NOT DETECTED NOT DETECTED Final   Enterotoxigenic E coli (ETEC) NOT DETECTED NOT DETECTED Final   Shiga like toxin producing E coli (STEC) NOT DETECTED NOT DETECTED Final   Shigella/Enteroinvasive E coli (EIEC) NOT DETECTED NOT DETECTED Final   Cryptosporidium NOT DETECTED NOT DETECTED Final   Cyclospora cayetanensis NOT DETECTED NOT DETECTED Final   Entamoeba histolytica NOT DETECTED NOT DETECTED Final   Giardia lamblia NOT DETECTED NOT DETECTED Final   Adenovirus F40/41 NOT DETECTED NOT DETECTED Final  Astrovirus NOT DETECTED NOT DETECTED Final   Norovirus GI/GII NOT DETECTED NOT DETECTED Final   Rotavirus A NOT DETECTED NOT DETECTED Final   Sapovirus (I, II, IV, and V) NOT DETECTED NOT DETECTED Final    Comment: Performed at [facility], [address] [place], [place] [address]    Labs: CBC: Recent Labs  Lab 05/25/22 1156 05/25/22 1905 05/26/22 0501 05/27/22 0719 05/28/22 0610 05/29/22 0526  WBC 6.9 7.5 7.0 7.7 6.0 5.2  NEUTROABS 4.9 5.0 4.8  --  4.1 3.7  HGB 10.0* 10.0* 8.8* 9.2* 9.3* 9.3*  HCT 30.4* 29.9* 27.2* 28.2* 28.7* 28.5*  MCV 81.3 81.3 82.4 82.9 83.4 82.6  PLT 389.0 442* 350 334 318 244   Basic Metabolic Panel: Recent Labs  Lab 05/25/22 1156 05/25/22 1905 05/26/22 0501 05/27/22 0719 05/28/22 0610 05/29/22 0526  NA 133* 135 140 142 144 140  K 4.7 4.1 4.7 3.9 3.7 4.2  CL 101 108 113* 115* 118* 113*  CO2 22 17* 19* 21* 20* 22  GLUCOSE 111* 182* 111* 43* 31* 75  BUN 37* 37* 31* '17 12 15  '$ CREATININE 1.31* 1.43* 1.08* 0.91 0.89 0.98  CALCIUM 10.3 9.7 9.2 8.7* 8.9 9.0  MG 0.7* 0.8* 1.5* 1.6*  --  2.4   Liver Function Tests: Recent Labs  Lab 05/25/22 1156 05/25/22 1905 05/26/22 0501 05/27/22 0719 05/28/22 0610  AST '18 25 17 23 27  '$ ALT '14 16 14 15 16  '$ ALKPHOS 39 45 33* 33* 33*  BILITOT 0.4 0.2* 0.3 0.2* 0.4  PROT 7.0 6.8 6.2* 6.5 6.5  ALBUMIN 4.2 3.8 3.3* 3.4* 3.5   CBG: Recent Labs  Lab 05/28/22 1631 05/28/22 2133  05/29/22 0256 05/29/22 0732 05/29/22 1108  GLUCAP 191* 155* 103* 74 175*    Discharge time spent: greater than 30 minutes.  Signed: Kyira Volkert, DO [facility] 05/29/2022

## 2022-05-30 ENCOUNTER — Encounter: Payer: Self-pay | Admitting: *Deleted

## 2022-05-30 ENCOUNTER — Telehealth: Payer: Self-pay | Admitting: *Deleted

## 2022-05-30 LAB — VITAMIN B1: Vitamin B1 (Thiamine): 8 nmol/L (ref 8–30)

## 2022-05-30 NOTE — Patient Outreach (Signed)
  Care Coordination [facility] Note Transition Care Management Unsuccessful Follow-up Telephone Call  Date of discharge and from where:  05/29/22- [facility]  Attempts:  1st Attempt  Reason for unsuccessful TCM follow-up call:  Left voice message  Oneta Rack, RN, BSN, [place] RN CM [facility] Care Coordination [phone]: direct office

## 2022-05-31 ENCOUNTER — Telehealth: Payer: Self-pay | Admitting: *Deleted

## 2022-05-31 ENCOUNTER — Encounter: Payer: Self-pay | Admitting: *Deleted

## 2022-05-31 NOTE — Patient Outreach (Addendum)
  Care Coordination [facility] Note Transition Care Management Follow-up Telephone Call Date of discharge and from where: 05/29/22- [facility] How have you been since you were released from the hospital? "Overall, I am doing very good, I am just very tired and trying to get some rest" Any questions or concerns? No  Items Reviewed: Did the pt receive and understand the discharge instructions provided? Yes  Medications obtained and verified? Yes  Other?  N/A Any new allergies since your discharge? No  Dietary orders reviewed? Yes Do you have support at home? Yes   Home Care and Equipment/Supplies: Were home health services ordered? no If so, what is the name of the agency? N/A  Has the agency set up a time to come to the patient's home? not applicable Were any new equipment or medical supplies ordered?  No What is the name of the medical supply agency? N/A Were you able to get the supplies/equipment? not applicable Do you have any questions related to the use of the equipment or supplies? No- N/A  Functional Questionnaire: (I = Independent and D = Dependent) ADLs: I  Bathing/Dressing- I  Meal Prep- I  Eating- I  Maintaining continence- I  Transferring/Ambulation- I  Managing Meds- I  Follow up appointments reviewed:  PCP Hospital f/u appt confirmed? Yes  Scheduled to see PCP on 06/12/22 @ 11:00 am. [facility] f/u appt confirmed? Yes  Scheduled to see endocrinology provider on 06/01/22 @ 11:00 am. Are transportation arrangements needed? No  If their condition worsens, is the pt aware to call PCP or go to the Emergency Dept.? Yes Was the patient provided with contact information for the PCP's office or ED? Yes Was to pt encouraged to call back with questions or concerns? Yes  SDOH assessments and interventions completed:   Yes  Care Coordination Interventions Activated:  Yes Care Coordination Interventions:   Reviewed discharge instructions with patient and confirmed  good understanding of same   Encounter Outcome:  Pt. Visit Completed  Oneta Rack, RN, BSN, [place] [phone]: direct office

## 2022-06-01 ENCOUNTER — Ambulatory Visit (INDEPENDENT_AMBULATORY_CARE_PROVIDER_SITE_OTHER): Payer: Medicare Other | Admitting: Endocrinology

## 2022-06-01 ENCOUNTER — Encounter: Payer: Self-pay | Admitting: Endocrinology

## 2022-06-01 VITALS — BP 138/44 | HR 58 | Ht 61.0 in | Wt 109.8 lb

## 2022-06-01 DIAGNOSIS — E210 Primary hyperparathyroidism: Secondary | ICD-10-CM

## 2022-06-01 DIAGNOSIS — E1165 Type 2 diabetes mellitus with hyperglycemia: Secondary | ICD-10-CM

## 2022-06-01 DIAGNOSIS — Z794 Long term (current) use of insulin: Secondary | ICD-10-CM

## 2022-06-01 DIAGNOSIS — E038 Other specified hypothyroidism: Secondary | ICD-10-CM | POA: Diagnosis not present

## 2022-06-01 DIAGNOSIS — M85859 Other specified disorders of bone density and structure, unspecified thigh: Secondary | ICD-10-CM | POA: Diagnosis not present

## 2022-06-01 NOTE — Patient Instructions (Signed)
Stop Levethyroxine

## 2022-06-01 NOTE — Progress Notes (Signed)
Patient ID: Jasmine Keith, female   DOB: May 21, 1951, 71 y.o.   MRN: [id number]            Chief complaint: Endocrinology follow-up  History of Present Illness:   HYPERCALCEMIA: Available show that she has had a high calcium since at least 12/2018 At that time it was slightly above normal at 10.5 She also had a 11.0 calcium on 06/26/2019  Highest level is 11.2 done on 04/09/2020, at the same time albumin is 4.9 Recent calcium levels have been normal except on hospitalization  Lab Results  Component Value Date   CALCIUM 9.0 05/29/2022   CALCIUM 8.9 05/28/2022   CALCIUM 8.7 (L) 05/27/2022   CALCIUM 9.2 05/26/2022   CALCIUM 9.7 05/25/2022   CALCIUM 10.3 05/25/2022   CALCIUM 9.9 09/23/2021   CALCIUM 9.6 08/18/2021   CALCIUM 9.5 05/11/2021    The hypercalcemia is not associated with any history of fractures or kidney stones  She does have OSTEOPENIA with lowest T score -2.3 at the left femoral neck  For this she was started on Fosamax 70 mg weekly in 06/2020 Taking this regularly on empty stomach weekly without side effects  She did not take any calcium supplements  She was recommended OTC vitamin D   PTH levels have been fluctuating between 32 and 45  Lab Results  Component Value Date   PTH 45 05/31/2020   PTH Comment 05/31/2020   CALCIUM 9.0 05/29/2022   CAION 5.8 (H) 05/31/2020      25 (OH) Vitamin D level:  Lab Results  Component Value Date   VD25OH 42.00 08/18/2021   VD25OH 29.84 (L) 12/02/2020    BONE density results:  Results:  06/23/2020 Lumbar spine L1-L4 Femoral neck (FN) 33% left distal radius  T-score -0.3 RFN: -2.2 LFN: -2.3      Allergies as of 06/01/2022   No Known Allergies      Medication List        Accurate as of June 01, 2022 11:25 AM. If you have any questions, ask your nurse or doctor.          acetaminophen 325 MG tablet Commonly known as: TYLENOL Take 2 tablets (650 mg total) by mouth every 6 (six) hours as needed for  mild pain (or Fever >/= 101).   alendronate 70 MG tablet Commonly known as: FOSAMAX TAKE 1 TABLET BY MOUTH ONCE A WEEK. TAKE WITH A FULL GLASS OF WATER ON AN EMPTY STOMACH. What changed: See the new instructions.   amLODipine 10 MG tablet Commonly known as: NORVASC Take 1 tablet (10 mg total) by mouth daily.   BD Pen Needle Nano U/F 32G X 4 MM Misc Generic drug: Insulin Pen Needle Use one needle daily to inject insulin   busPIRone 7.5 MG tablet Commonly known as: BUSPAR TAKE 1 TABLET BY MOUTH 2 TIMES DAILY.   fenofibrate 160 MG tablet TAKE 1 TABLET BY MOUTH EVERY DAY   gabapentin 800 MG tablet Commonly known as: NEURONTIN Take 1 tablet (800 mg total) by mouth 2 (two) times daily.   hydrALAZINE 100 MG tablet Commonly known as: APRESOLINE Take 100 mg by mouth 2 (two) times daily.   ibuprofen 200 MG tablet Commonly known as: ADVIL Take 600 mg by mouth daily as needed (pain/aches).   insulin detemir 100 UNIT/ML injection Commonly known as: LEVEMIR Inject 0.1 mLs (10 Units total) into the skin at bedtime.   levothyroxine 25 MCG tablet Commonly known as: SYNTHROID TAKE 1 TABLET BY  MOUTH EVERY DAY BEFORE BREAKFAST What changed:  how much to take how to take this when to take this additional instructions   losartan 100 MG tablet Commonly known as: COZAAR TAKE 1 TABLET BY MOUTH EVERY DAY   Magnesium Oxide -Mg Supplement 500 MG Caps Take 1 capsule (500 mg total) by mouth 2 (two) times daily. For 7 days.   metFORMIN 1000 MG tablet Commonly known as: GLUCOPHAGE Take 1 tablet (1,000 mg total) by mouth 2 (two) times daily with a meal.   metoprolol tartrate 50 MG tablet Commonly known as: LOPRESSOR Take 50 mg by mouth 2 (two) times daily.   nicotine 14 mg/24hr patch Commonly known as: NICODERM CQ - dosed in mg/24 hours Place 1 patch (14 mg total) onto the skin daily.   pantoprazole 40 MG tablet Commonly known as: PROTONIX TAKE 1 TABLET BY MOUTH EVERY DAY    venlafaxine XR 75 MG 24 hr capsule Commonly known as: EFFEXOR-XR TAKE 1 CAPSULE BY MOUTH DAILY WITH BREAKFAST.        Allergies: No Known Allergies  Past Medical History:  Diagnosis Date   Anxiety    Diabetes mellitus without complication (HCC)    Fibromyalgia    GERD (gastroesophageal reflux disease)    Hyperlipidemia    Hypertension    Osteoporosis    Thyroid disease     Past Surgical History:  Procedure Laterality Date   coronary stent placed  1993    Family History  Problem Relation Age of Onset   Diabetes Mother    Kidney disease Mother    Diabetes Father    Cancer Sister        lung   Kidney disease Sister    Thyroid disease Neg Hx    Colon cancer Neg Hx    Esophageal cancer Neg Hx    Rectal cancer Neg Hx    Stomach cancer Neg Hx     Social History:  reports that she has been smoking cigarettes. She has been smoking an average of .5 packs per day. She has never used smokeless tobacco. She reports current alcohol use. She reports that she does not currently use drugs.  Review of Systems  She is being managed by her PCP in follow-up for diabetes on Levemir and Metformin Blood sugar range at home 96-180, higher after meals Levemir was reduced when she was hospitalized because of hypoglycemia recently  Lab Results  Component Value Date   HGBA1C 6.8 (H) 05/26/2022   HGBA1C 6.7 (H) 09/23/2021   HGBA1C 6.9 (H) 04/05/2021    Lab Results  Component Value Date   CREATININE 0.98 05/29/2022   FATIGUE: Continues to complain of significant fatigue and somnolence for several months  HYPOTHYROIDISM: She was tried on levothyroxine 25 mcg daily in 2/22 when TSH was trending higher She did not feel any better with this but this is continued by her PCP  Lab Results  Component Value Date   TSH 4.65 05/25/2022   TSH 4.24 08/18/2021   TSH 3.90 03/14/2021   FREET4 1.03 05/25/2022   FREET4 0.96 08/18/2021   FREET4 0.88 03/14/2021     EXAM:  BP (!) 138/44    Pulse (!) 58   Ht '5\' 1"'$  (1.549 m)   Wt 109 lb 12.8 oz (49.8 kg)   SpO2 97%   BMI 20.75 kg/m     Assessment/Plan:   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:  She has had mild chronic hypercalcemia with levels not above normal now  OSTEOPENIA:  Baseline T score is -2.2 at the hip  Continues on Fosamax prophylactically because of associated hyperparathyroidism No side effects with this and she is taking it as prescribed regularly   DIABETES: She is being followed by PCP Her blood sugars are not optimally controlled and she will ask her PCP if she needs an endocrinology consultation  Subclinical hypothyroidism: Unlikely that she has symptoms and likely not needing any supplementation at this point, she will discuss with PCP  Fatigue and somnolence: Not clear if she can have sleep apnea or other conditions, needs to follow-up with PCP   Elayne Snare 06/01/2022, 11:25 AM

## 2022-06-09 ENCOUNTER — Other Ambulatory Visit: Payer: Self-pay

## 2022-06-09 ENCOUNTER — Emergency Department (HOSPITAL_BASED_OUTPATIENT_CLINIC_OR_DEPARTMENT_OTHER): Payer: Medicare Other

## 2022-06-09 ENCOUNTER — Encounter (HOSPITAL_COMMUNITY): Payer: Self-pay

## 2022-06-09 ENCOUNTER — Inpatient Hospital Stay (HOSPITAL_BASED_OUTPATIENT_CLINIC_OR_DEPARTMENT_OTHER)
Admission: EM | Admit: 2022-06-09 | Discharge: 2022-06-13 | DRG: 640 | Disposition: A | Discharge status: Home / Self Care | Payer: Medicare Other | Attending: Internal Medicine | Admitting: Internal Medicine

## 2022-06-09 ENCOUNTER — Encounter (HOSPITAL_BASED_OUTPATIENT_CLINIC_OR_DEPARTMENT_OTHER): Payer: Self-pay | Admitting: *Deleted

## 2022-06-09 DIAGNOSIS — E039 Hypothyroidism, unspecified: Secondary | ICD-10-CM

## 2022-06-09 DIAGNOSIS — Z794 Long term (current) use of insulin: Secondary | ICD-10-CM | POA: Diagnosis not present

## 2022-06-09 DIAGNOSIS — E876 Hypokalemia: Secondary | ICD-10-CM | POA: Diagnosis present

## 2022-06-09 DIAGNOSIS — F17210 Nicotine dependence, cigarettes, uncomplicated: Secondary | ICD-10-CM | POA: Diagnosis not present

## 2022-06-09 DIAGNOSIS — F419 Anxiety disorder, unspecified: Secondary | ICD-10-CM | POA: Diagnosis not present

## 2022-06-09 DIAGNOSIS — G9341 Metabolic encephalopathy: Secondary | ICD-10-CM | POA: Diagnosis not present

## 2022-06-09 DIAGNOSIS — Z833 Family history of diabetes mellitus: Secondary | ICD-10-CM

## 2022-06-09 DIAGNOSIS — K219 Gastro-esophageal reflux disease without esophagitis: Secondary | ICD-10-CM | POA: Diagnosis not present

## 2022-06-09 DIAGNOSIS — Z955 Presence of coronary angioplasty implant and graft: Secondary | ICD-10-CM

## 2022-06-09 DIAGNOSIS — R627 Adult failure to thrive: Secondary | ICD-10-CM | POA: Diagnosis not present

## 2022-06-09 DIAGNOSIS — E860 Dehydration: Secondary | ICD-10-CM | POA: Diagnosis not present

## 2022-06-09 DIAGNOSIS — N390 Urinary tract infection, site not specified: Secondary | ICD-10-CM | POA: Diagnosis present

## 2022-06-09 DIAGNOSIS — M810 Age-related osteoporosis without current pathological fracture: Secondary | ICD-10-CM | POA: Diagnosis present

## 2022-06-09 DIAGNOSIS — E785 Hyperlipidemia, unspecified: Secondary | ICD-10-CM | POA: Diagnosis not present

## 2022-06-09 DIAGNOSIS — Z720 Tobacco use: Secondary | ICD-10-CM

## 2022-06-09 DIAGNOSIS — M797 Fibromyalgia: Secondary | ICD-10-CM | POA: Diagnosis not present

## 2022-06-09 DIAGNOSIS — D638 Anemia in other chronic diseases classified elsewhere: Secondary | ICD-10-CM | POA: Diagnosis present

## 2022-06-09 DIAGNOSIS — E119 Type 2 diabetes mellitus without complications: Secondary | ICD-10-CM

## 2022-06-09 DIAGNOSIS — N179 Acute kidney failure, unspecified: Secondary | ICD-10-CM | POA: Diagnosis present

## 2022-06-09 DIAGNOSIS — Z7983 Long term (current) use of bisphosphonates: Secondary | ICD-10-CM | POA: Diagnosis not present

## 2022-06-09 DIAGNOSIS — E1165 Type 2 diabetes mellitus with hyperglycemia: Secondary | ICD-10-CM | POA: Diagnosis not present

## 2022-06-09 DIAGNOSIS — Z7984 Long term (current) use of oral hypoglycemic drugs: Secondary | ICD-10-CM | POA: Diagnosis not present

## 2022-06-09 DIAGNOSIS — Z79890 Hormone replacement therapy (postmenopausal): Secondary | ICD-10-CM | POA: Diagnosis not present

## 2022-06-09 DIAGNOSIS — I10 Essential (primary) hypertension: Secondary | ICD-10-CM

## 2022-06-09 DIAGNOSIS — Z79899 Other long term (current) drug therapy: Secondary | ICD-10-CM

## 2022-06-09 LAB — URINALYSIS, ROUTINE W REFLEX MICROSCOPIC
Bilirubin Urine: NEGATIVE
Glucose, UA: NEGATIVE mg/dL
Hgb urine dipstick: NEGATIVE
Ketones, ur: NEGATIVE mg/dL
Nitrite: NEGATIVE
Protein, ur: 100 mg/dL — AB
Specific Gravity, Urine: 1.02 (ref 1.005–1.030)
pH: 5.5 (ref 5.0–8.0)

## 2022-06-09 LAB — COMPREHENSIVE METABOLIC PANEL
ALT: 16 U/L (ref 0–44)
AST: 29 U/L (ref 15–41)
Albumin: 4.3 g/dL (ref 3.5–5.0)
Alkaline Phosphatase: 35 U/L — ABNORMAL LOW (ref 38–126)
Anion gap: 10 (ref 5–15)
BUN: 37 mg/dL — ABNORMAL HIGH (ref 8–23)
CO2: 19 mmol/L — ABNORMAL LOW (ref 22–32)
Calcium: 9.7 mg/dL (ref 8.9–10.3)
Chloride: 108 mmol/L (ref 98–111)
Creatinine, Ser: 1.48 mg/dL — ABNORMAL HIGH (ref 0.44–1.00)
GFR, Estimated: 38 mL/min — ABNORMAL LOW (ref >60)
Glucose, Bld: 171 mg/dL — ABNORMAL HIGH (ref 70–99)
Potassium: 4.2 mmol/L (ref 3.5–5.1)
Sodium: 137 mmol/L (ref 135–145)
Total Bilirubin: 0.4 mg/dL (ref 0.3–1.2)
Total Protein: 8 g/dL (ref 6.5–8.1)

## 2022-06-09 LAB — CBC WITH DIFFERENTIAL/PLATELET
Abs Immature Granulocytes: 0.03 10*3/uL (ref 0.00–0.07)
Basophils Absolute: 0.1 10*3/uL (ref 0.0–0.1)
Basophils Relative: 1 %
Eosinophils Absolute: 0.1 10*3/uL (ref 0.0–0.5)
Eosinophils Relative: 1 %
HCT: 32.5 % — ABNORMAL LOW (ref 36.0–46.0)
Hemoglobin: 10.6 g/dL — ABNORMAL LOW (ref 12.0–15.0)
Immature Granulocytes: 0 %
Lymphocytes Relative: 14 %
Lymphs Abs: 1.2 10*3/uL (ref 0.7–4.0)
MCH: 26.5 pg (ref 26.0–34.0)
MCHC: 32.6 g/dL (ref 30.0–36.0)
MCV: 81.3 fL (ref 80.0–100.0)
Monocytes Absolute: 0.5 10*3/uL (ref 0.1–1.0)
Monocytes Relative: 5 %
Neutro Abs: 6.7 10*3/uL (ref 1.7–7.7)
Neutrophils Relative %: 79 %
Platelets: 474 10*3/uL — ABNORMAL HIGH (ref 150–400)
RBC: 4 MIL/uL (ref 3.87–5.11)
RDW: 14.3 % (ref 11.5–15.5)
WBC: 8.5 10*3/uL (ref 4.0–10.5)
nRBC: 0 % (ref 0.0–0.2)

## 2022-06-09 LAB — URINALYSIS, MICROSCOPIC (REFLEX)

## 2022-06-09 LAB — AMMONIA: Ammonia: 21 umol/L (ref 9–35)

## 2022-06-09 LAB — MAGNESIUM: Magnesium: 1.4 mg/dL — ABNORMAL LOW (ref 1.7–2.4)

## 2022-06-09 MED ORDER — SODIUM CHLORIDE 0.9 % IV BOLUS
1000.0000 mL | Freq: Once | INTRAVENOUS | Status: AC
  Administered 2022-06-09: 1000 mL via INTRAVENOUS

## 2022-06-09 MED ORDER — INSULIN ASPART 100 UNIT/ML IJ SOLN
0.0000 [IU] | Freq: Three times a day (TID) | INTRAMUSCULAR | Status: DC
  Administered 2022-06-10 – 2022-06-11 (×3): 2 [IU] via SUBCUTANEOUS
  Administered 2022-06-11 (×2): 1 [IU] via SUBCUTANEOUS
  Administered 2022-06-12 (×2): 2 [IU] via SUBCUTANEOUS
  Administered 2022-06-12 – 2022-06-13 (×2): 1 [IU] via SUBCUTANEOUS

## 2022-06-09 MED ORDER — ENSURE MAX PROTEIN PO LIQD
11.0000 [oz_av] | Freq: Every day | ORAL | Status: DC
  Administered 2022-06-10 – 2022-06-13 (×2): 11 [oz_av] via ORAL
  Filled 2022-06-09 (×4): qty 330

## 2022-06-09 MED ORDER — ENSURE ENLIVE PO LIQD
237.0000 mL | Freq: Two times a day (BID) | ORAL | Status: DC
  Administered 2022-06-12: 237 mL via ORAL

## 2022-06-09 MED ORDER — ONDANSETRON HCL 4 MG/2ML IJ SOLN
4.0000 mg | Freq: Four times a day (QID) | INTRAMUSCULAR | Status: DC | PRN
  Administered 2022-06-11 (×2): 4 mg via INTRAVENOUS
  Filled 2022-06-09 (×2): qty 2

## 2022-06-09 MED ORDER — ACETAMINOPHEN 650 MG RE SUPP: 650.0000 mg | Freq: Four times a day (QID) | RECTAL | Status: DC | PRN

## 2022-06-09 MED ORDER — CLONIDINE HCL 0.2 MG PO TABS: 0.2000 mg | ORAL_TABLET | ORAL | Status: DC | PRN

## 2022-06-09 MED ORDER — METOPROLOL SUCCINATE ER 50 MG PO TB24
50.0000 mg | ORAL_TABLET | Freq: Every day | ORAL | Status: DC
  Administered 2022-06-10 – 2022-06-13 (×4): 50 mg via ORAL
  Filled 2022-06-09 (×4): qty 1

## 2022-06-09 MED ORDER — DICYCLOMINE HCL 10 MG PO CAPS
10.0000 mg | ORAL_CAPSULE | Freq: Three times a day (TID) | ORAL | Status: DC
  Administered 2022-06-09 – 2022-06-13 (×12): 10 mg via ORAL
  Filled 2022-06-09 (×12): qty 1

## 2022-06-09 MED ORDER — ONDANSETRON HCL 4 MG PO TABS: 4.0000 mg | ORAL_TABLET | Freq: Four times a day (QID) | ORAL | Status: DC | PRN

## 2022-06-09 MED ORDER — LEVOTHYROXINE SODIUM 25 MCG PO TABS
25.0000 ug | ORAL_TABLET | Freq: Every day | ORAL | Status: DC
Start: 2022-06-10 — End: 2022-06-13
  Administered 2022-06-10 – 2022-06-13 (×3): 25 ug via ORAL
  Filled 2022-06-09 (×3): qty 1

## 2022-06-09 MED ORDER — ACETAMINOPHEN 325 MG PO TABS
650.0000 mg | ORAL_TABLET | Freq: Four times a day (QID) | ORAL | Status: DC | PRN
  Administered 2022-06-10: 650 mg via ORAL
  Filled 2022-06-09: qty 2

## 2022-06-09 MED ORDER — AMLODIPINE BESYLATE 10 MG PO TABS
10.0000 mg | ORAL_TABLET | Freq: Every day | ORAL | Status: DC
  Administered 2022-06-10 – 2022-06-13 (×4): 10 mg via ORAL
  Filled 2022-06-09 (×4): qty 1

## 2022-06-09 MED ORDER — INSULIN ASPART 100 UNIT/ML IJ SOLN
0.0000 [IU] | Freq: Every day | INTRAMUSCULAR | Status: DC
  Administered 2022-06-11: 3 [IU] via SUBCUTANEOUS
  Administered 2022-06-12: 2 [IU] via SUBCUTANEOUS

## 2022-06-09 MED ORDER — FENOFIBRATE 160 MG PO TABS
160.0000 mg | ORAL_TABLET | Freq: Every day | ORAL | Status: DC
  Administered 2022-06-10 – 2022-06-13 (×3): 160 mg via ORAL
  Filled 2022-06-09 (×4): qty 1

## 2022-06-09 MED ORDER — HEPARIN SODIUM (PORCINE) 5000 UNIT/ML IJ SOLN
5000.0000 [IU] | Freq: Three times a day (TID) | INTRAMUSCULAR | Status: DC
  Administered 2022-06-10 – 2022-06-13 (×8): 5000 [IU] via SUBCUTANEOUS
  Filled 2022-06-09 (×9): qty 1

## 2022-06-09 MED ORDER — LACTATED RINGERS IV SOLN: INTRAVENOUS | Status: DC

## 2022-06-09 MED ORDER — HYDRALAZINE HCL 50 MG PO TABS
100.0000 mg | ORAL_TABLET | Freq: Two times a day (BID) | ORAL | Status: DC
  Administered 2022-06-09 – 2022-06-13 (×8): 100 mg via ORAL
  Filled 2022-06-09 (×8): qty 2

## 2022-06-09 MED ORDER — MAGNESIUM SULFATE 2 GM/50ML IV SOLN
2.0000 g | Freq: Once | INTRAVENOUS | Status: AC
  Administered 2022-06-09: 2 g via INTRAVENOUS
  Filled 2022-06-09: qty 50

## 2022-06-09 NOTE — Assessment & Plan Note (Signed)
Observation telemetry bed. Continue with IVF with LR. Hold losartan. Likely due to poor po intake, ARB therapy, loose stools.

## 2022-06-09 NOTE — ED Triage Notes (Signed)
Husband states since DC from hospital she has been dizzy and weak, at times still having diarrhea. Presents today with dizziness and weakness.

## 2022-06-09 NOTE — Progress Notes (Signed)
Called by the ED provider regarding admission on this patient who is 70 year old with history of HTN, hypothyroidism, GERD, DM 2 who presented to the hospital due to lethargy, weakness and concerns for failure to thrive. Upon admission she was found to have clinical dehydration, acute kidney injury and hypomagnesemia. Patient recently had C. difficile infection several days ago at a hospital in [place] which was treated with vancomycin but due to persistence of diarrhea she was admitted again with [place] and discharged on 7/24 after being treated for dehydration and AKI.  During that time initially patient was started on Dificid but with negative PCR/toxin it was stopped.  It appears patient also had urinary tract infection at that time.  At this time I have requested ED provider to obtain CT of the head, UA, ammonia levels prior to determining appropriate level of care especially if CT of the head is abnormal patient will need to go to [facility] versus coming to [facility].  ED provider to call [place] back with updated results prior to determining level of admission and campus.  Gerlean Ren MD

## 2022-06-09 NOTE — Assessment & Plan Note (Signed)
Continue with IVF. Repeat BMP in AM. 

## 2022-06-09 NOTE — Assessment & Plan Note (Signed)
Continue protonix 40 mg 

## 2022-06-09 NOTE — ED Notes (Addendum)
Pt c/o dizziness,and weakness,  appears very sleepy, ill appearing, slow to respond to commands and questions. Placed on cont cardiac monitoring. Husband states she has not felt very well since DC from [facility] with C diff. Still having occ loose stools.

## 2022-06-09 NOTE — H&P (Signed)
History and Physical    Jasmine Keith JSE:[id number] DOB: [birth year] DOA: 06/09/2022  DOS: the patient was seen and examined on 06/09/2022  PCP: Mackie Pai, PA-C   Patient coming from: Home  I have personally briefly reviewed patient's old medical records in [place]  CC: fatigue, weakness HPI: 70 year old Caucasian female history of hypertension, type 2 diabetes, tobacco abuse, reflux, hypothyroidism presents to the hospital today with worsening weakness, lethargy, poor p.o. intake.  She was admitted 2 weeks ago for similar symptoms.  She was given IV hydration and sent home on p.o. magnesium.  She states that she has been taking magnesium but feels that this causes diarrhea.  She has been taking Imodium at home due to her diarrhea which in talking to her is actually just loose stools but her Imodium gave her some stomach pain so she stopped it.  Since stopping her Imodium, she has had increasing loose stools now numbering 2 times a day.  She denies any blood in her stools.  She been eating quite poorly at home.  Drinking minimally.  She continues to take her losartan for her hypertension.  She was brought to the ER by her husband today.  On arrival temp 90.3 heart rate 60 blood pressure 167/46 Labs showed a BUN of 37, creatinine 1.48, magnesium 1.4  Hemoglobin 10.6, platelets of 474, white count 8.5, ammonia level 21  CT head showed no acute intracranial findings.  EKG showed normal sinus rhythm.  Due to the patient's acute kidney injury, failure to thrive, [facility] hospitalist contacted for admission.   ED Course: Labs show acute kidney injury, hypomagnesemia  Review of Systems:  Review of Systems  Constitutional:  Positive for malaise/fatigue. Negative for chills and fever.  HENT: Negative.    Eyes: Negative.   Respiratory: Negative.    Cardiovascular: Negative.   Gastrointestinal:  Negative for blood in stool.       Loose stools but not frank diarrhea.  No  hematochezia.  Poor p.o. intake.  Genitourinary: Negative.   Musculoskeletal: Negative.   Skin: Negative.   Neurological: Negative.   Endo/Heme/Allergies: Negative.   Psychiatric/Behavioral: Negative.    All other systems reviewed and are negative.   Past Medical History:  Diagnosis Date   Anxiety    Diabetes mellitus without complication (HCC)    Fibromyalgia    GERD (gastroesophageal reflux disease)    Hyperlipidemia    Hypertension    Osteoporosis    Thyroid disease     Past Surgical History:  Procedure Laterality Date   coronary stent placed  1993     reports that she has been smoking cigarettes. She has been smoking an average of .5 packs per day. She has never used smokeless tobacco. She reports current alcohol use. She reports that she does not currently use drugs.  No Known Allergies  Family History  Problem Relation Age of Onset   Diabetes Mother    Kidney disease Mother    Diabetes Father    Cancer Sister        lung   Kidney disease Sister    Thyroid disease Neg Hx    Colon cancer Neg Hx    Esophageal cancer Neg Hx    Rectal cancer Neg Hx    Stomach cancer Neg Hx     Prior to Admission medications   Medication Sig Start Date End Date Taking? Authorizing Provider  acetaminophen (TYLENOL) 325 MG tablet Take 2 tablets (650 mg total) by mouth every  6 (six) hours as needed for mild pain (or Fever >/= 101). 05/29/22  Yes Swayze, Ava, DO  alendronate (FOSAMAX) 70 MG tablet TAKE 1 TABLET BY MOUTH ONCE A WEEK. TAKE WITH A FULL GLASS OF WATER ON AN EMPTY STOMACH. Patient taking differently: Take 70 mg by mouth once a week. 07/05/21  Yes Saguier, Percell Miller, PA-C  amLODipine (NORVASC) 10 MG tablet Take 1 tablet (10 mg total) by mouth daily. 04/24/22  Yes Saguier, Percell Miller, PA-C  busPIRone (BUSPAR) 7.5 MG tablet TAKE 1 TABLET BY MOUTH 2 TIMES DAILY. Patient taking differently: Take 7.5 mg by mouth daily. 03/03/22  Yes Saguier, Percell Miller, PA-C  fenofibrate 160 MG tablet TAKE 1  TABLET BY MOUTH EVERY DAY Patient taking differently: Take 160 mg by mouth daily. 05/08/22 08/06/22 Yes Saguier, Percell Miller, PA-C  gabapentin (NEURONTIN) 800 MG tablet Take 1 tablet (800 mg total) by mouth 2 (two) times daily. 04/24/22  Yes Saguier, Percell Miller, PA-C  hydrALAZINE (APRESOLINE) 100 MG tablet Take 100 mg by mouth 2 (two) times daily. 05/17/22  Yes [provider]  insulin detemir (LEVEMIR) 100 UNIT/ML injection Inject 0.1 mLs (10 Units total) into the skin at bedtime. Patient taking differently: Inject 20 Units into the skin at bedtime. 05/29/22  Yes Swayze, Ava, DO  levothyroxine (SYNTHROID) 25 MCG tablet TAKE 1 TABLET BY MOUTH EVERY DAY BEFORE BREAKFAST Patient taking differently: Take 25 mcg by mouth daily before breakfast. 04/24/22  Yes Saguier, Percell Miller, PA-C  losartan (COZAAR) 100 MG tablet TAKE 1 TABLET BY MOUTH EVERY DAY Patient taking differently: Take 100 mg by mouth daily. 12/07/21  Yes Saguier, Percell Miller, PA-C  metFORMIN (GLUCOPHAGE) 1000 MG tablet Take 1 tablet (1,000 mg total) by mouth 2 (two) times daily with a meal. 12/23/21  Yes Saguier, Percell Miller, PA-C  metoprolol succinate (TOPROL-XL) 50 MG 24 hr tablet Take 50 mg by mouth daily. 06/03/22  Yes [provider]  pantoprazole (PROTONIX) 40 MG tablet TAKE 1 TABLET BY MOUTH EVERY DAY Patient taking differently: Take 40 mg by mouth daily. 05/19/22  Yes Saguier, Percell Miller, PA-C  venlafaxine XR (EFFEXOR-XR) 75 MG 24 hr capsule TAKE 1 CAPSULE BY MOUTH DAILY WITH BREAKFAST. Patient taking differently: Take 75 mg by mouth daily with breakfast. 01/03/22  Yes Saguier, Percell Miller, PA-C  Insulin Pen Needle (BD PEN NEEDLE NANO U/F) 32G X 4 MM MISC Use one needle daily to inject insulin 12/23/21   Saguier, Percell Miller, PA-C  Magnesium Oxide -Mg Supplement 500 MG CAPS Take 1 capsule (500 mg total) by mouth 2 (two) times daily. For 7 days. Patient not taking: Reported on 06/09/2022 05/29/22   Mosie Lukes, MD  nicotine (NICODERM CQ - DOSED IN MG/24 HOURS) 14  mg/24hr patch Place 1 patch (14 mg total) onto the skin daily. Patient not taking: Reported on 06/09/2022 05/30/22   Karie Kirks, DO    Physical Exam: Vitals:   06/09/22 2000 06/09/22 2045 06/09/22 2100 06/09/22 2200  BP: (!) 185/53 (!) 170/41 (!) 176/44 (!) 185/52  Pulse: 65 67 66 62  Resp: '15 14 17 14  '$ Temp:    98.1 F (36.7 C)  TempSrc:    Oral  SpO2: 99% 99% 98% 97%  Weight:    50.6 kg    Physical Exam Vitals and nursing note reviewed.  Constitutional:      General: She is not in acute distress.    Appearance: Normal appearance. She is not ill-appearing, toxic-appearing or diaphoretic.  HENT:     Head: Normocephalic and atraumatic.  Nose: Nose normal. No rhinorrhea.  Eyes:     General: No scleral icterus. Cardiovascular:     Rate and Rhythm: Normal rate and regular rhythm.     Pulses: Normal pulses.  Pulmonary:     Effort: Pulmonary effort is normal. No respiratory distress.     Breath sounds: Normal breath sounds. No wheezing or rales.  Abdominal:     General: Abdomen is flat. Bowel sounds are normal. There is no distension.     Palpations: Abdomen is soft.     Tenderness: There is no abdominal tenderness. There is no guarding or rebound.  Musculoskeletal:     Right lower leg: No edema.     Left lower leg: No edema.  Skin:    General: Skin is warm and dry.     Capillary Refill: Capillary refill takes less than 2 seconds.  Neurological:     General: No focal deficit present.     Mental Status: She is alert and oriented to person, place, and time.      Labs on Admission: I have personally reviewed following labs and imaging studies  CBC: Recent Labs  Lab 06/09/22 1636  WBC 8.5  NEUTROABS 6.7  HGB 10.6*  HCT 32.5*  MCV 81.3  PLT 119*   Basic Metabolic Panel: Recent Labs  Lab 06/09/22 1636  NA 137  K 4.2  CL 108  CO2 19*  GLUCOSE 171*  BUN 37*  CREATININE 1.48*  CALCIUM 9.7  MG 1.4*   GFR: Estimated Creatinine Clearance: 27.1 mL/min (A)  (by C-G formula based on SCr of 1.48 mg/dL (H)). Liver Function Tests: Recent Labs  Lab 06/09/22 1636  AST 29  ALT 16  ALKPHOS 35*  BILITOT 0.4  PROT 8.0  ALBUMIN 4.3   No results for input(s): "LIPASE", "AMYLASE" in the last 168 hours. Recent Labs  Lab 06/09/22 1905  AMMONIA 21   Coagulation Profile: No results for input(s): "INR", "PROTIME" in the last 168 hours. Cardiac Enzymes: No results for input(s): "CKTOTAL", "CKMB", "CKMBINDEX", "TROPONINI", "TROPONINIHS" in the last 168 hours. BNP (last 3 results) No results for input(s): "PROBNP" in the last 8760 hours. HbA1C: No results for input(s): "HGBA1C" in the last 72 hours. CBG: No results for input(s): "GLUCAP" in the last 168 hours. Lipid Profile: No results for input(s): "CHOL", "HDL", "LDLCALC", "TRIG", "CHOLHDL", "LDLDIRECT" in the last 72 hours. Thyroid Function Tests: No results for input(s): "TSH", "T4TOTAL", "FREET4", "T3FREE", "THYROIDAB" in the last 72 hours. Anemia Panel: No results for input(s): "VITAMINB12", "FOLATE", "FERRITIN", "TIBC", "IRON", "RETICCTPCT" in the last 72 hours. Urine analysis:    Component Value Date/Time   COLORURINE YELLOW 06/09/2022 1937   APPEARANCEUR CLOUDY (A) 06/09/2022 1937   LABSPEC 1.020 06/09/2022 1937   PHURINE 5.5 06/09/2022 1937   GLUCOSEU NEGATIVE 06/09/2022 1937   HGBUR NEGATIVE 06/09/2022 1937   BILIRUBINUR NEGATIVE 06/09/2022 1937   BILIRUBINUR 1+ 03/09/2021 1230   KETONESUR NEGATIVE 06/09/2022 1937   PROTEINUR 100 (A) 06/09/2022 1937   UROBILINOGEN 0.2 03/09/2021 1230   NITRITE NEGATIVE 06/09/2022 1937   LEUKOCYTESUR SMALL (A) 06/09/2022 1937    Radiological Exams on Admission: I have personally reviewed images CT Head Wo Contrast  Result Date: 06/09/2022 CLINICAL DATA:  Mental status change, unknown cause EXAM: CT HEAD WITHOUT CONTRAST TECHNIQUE: Contiguous axial images were obtained from the base of the skull through the vertex without intravenous contrast.  RADIATION DOSE REDUCTION: This exam was performed according to the departmental dose-optimization program which includes automated  exposure control, adjustment of the mA and/or kV according to patient size and/or use of iterative reconstruction technique. COMPARISON:  CT head March 02, 2021. FINDINGS: Brain: No evidence of acute infarction, hemorrhage, hydrocephalus, extra-axial collection or mass lesion/mass effect. Partially empty sella. Vascular: No hyperdense vessel identified. Skull: No acute fracture. Sinuses/Orbits: Clear sinuses.  No acute orbital findings. Other: No mastoid effusions. IMPRESSION: No evidence of acute intracranial abnormality. Electronically Signed   By: Margaretha Sheffield M.D.   On: 06/09/2022 19:00    EKG: My personal interpretation of EKG shows: NSR    Assessment/Plan Principal Problem:   AKI (acute kidney injury) ([place]) Active Problems:   Dehydration   Hypomagnesemia   DM2 (diabetes mellitus, type 2) (HCC)   GERD (gastroesophageal reflux disease)   HTN (hypertension)   Hypothyroidism   Tobacco abuse    Assessment and Plan: * AKI (acute kidney injury) ([place]) Observation telemetry bed. Continue with IVF with LR. Hold losartan. Likely due to poor po intake, ARB therapy, loose stools.  Hypomagnesemia Given 2 grams IV mag in ER. Repeat Mg level in AM. Pt states that mag oxide 500 mg tablets gives her diarrhea. Discussed that nuts such as almonds and cashews are better sources of dietary magnesium that won't cause diarrhea.  Dehydration Continue with IVF. Repeat BMP in AM.  Tobacco abuse Pt declines nicotine patch.  Hypothyroidism Continue synthroid 25 mcg daily.  HTN (hypertension) Continue toprol-xl 50 mg and norvasc 10 mg, hydralazine 100 mg. Hold losartan due to AKI.  GERD (gastroesophageal reflux disease) Continue protonix 40 mg.  DM2 (diabetes mellitus, type 2) (HCC) Add SSI. Hold levemir until appetite is better.   DVT prophylaxis: SQ  Heparin Code Status: Full Code Family Communication: no family at bedside  Disposition Plan: return home  Consults called: none  Admission status: Observation, Telemetry bed   Kristopher Oppenheim, DO [facility] 06/09/2022, 10:57 PM

## 2022-06-09 NOTE — ED Notes (Signed)
Will obtain labs from pt. After her meds infuse.  Will not stick the Pt. Again due to her feeling so bad at this time.

## 2022-06-09 NOTE — Assessment & Plan Note (Signed)
Add SSI. Hold levemir until appetite is better.

## 2022-06-09 NOTE — ED Notes (Signed)
Pt. Husband is Marguerita Merles   Call [facility] Phone First [phone]  Cell Phone [phone]

## 2022-06-09 NOTE — Assessment & Plan Note (Signed)
Continue synthroid 25 mcg daily. 

## 2022-06-09 NOTE — Assessment & Plan Note (Signed)
Pt declines nicotine patch.

## 2022-06-09 NOTE — ED Notes (Addendum)
Per Pt. Husband the pt. Has been fighting C diff off and on for several weeks now.  Pt. Started out with C diff in [place] and now is still having issues with abd. Pain and dehydration after weeks of admissions and treatment.

## 2022-06-09 NOTE — Assessment & Plan Note (Signed)
Given 2 grams IV mag in ER. Repeat Mg level in AM. Pt states that mag oxide 500 mg tablets gives her diarrhea. Discussed that nuts such as almonds and cashews are better sources of dietary magnesium that won't cause diarrhea.

## 2022-06-09 NOTE — Subjective & Objective (Signed)
CC: fatigue, weakness HPI: 71 year old Caucasian female history of hypertension, type 2 diabetes, tobacco abuse, reflux, hypothyroidism presents to the hospital today with worsening weakness, lethargy, poor p.o. intake.  She was admitted 2 weeks ago for similar symptoms.  She was given IV hydration and sent home on p.o. magnesium.  She states that she has been taking magnesium but feels that this causes diarrhea.  She has been taking Imodium at home due to her diarrhea which in talking to her is actually just loose stools but her Imodium gave her some stomach pain so she stopped it.  Since stopping her Imodium, she has had increasing loose stools now numbering 2 times a day.  She denies any blood in her stools.  She been eating quite poorly at home.  Drinking minimally.  She continues to take her losartan for her hypertension.  She was brought to the ER by her husband today.  On arrival temp 90.3 heart rate 60 blood pressure 167/46 Labs showed a BUN of 37, creatinine 1.48, magnesium 1.4  Hemoglobin 10.6, platelets of 474, white count 8.5, ammonia level 21  CT head showed no acute intracranial findings.  EKG showed normal sinus rhythm.  Due to the patient's acute kidney injury, failure to thrive, [facility] hospitalist contacted for admission.

## 2022-06-09 NOTE — ED Provider Notes (Signed)
[facility] Provider Note   CSN: [id number] Arrival date & time: 06/09/22  1550     History  Chief Complaint  Patient presents with   Dizziness    Jasmine Keith is a 71 y.o. female with a past medical history significant for hypertension, hypothyroidism, acid reflux, type 2 diabetes who presents with generalized weakness, dizziness.  Patient was recently hospitalized in [place] for C. difficile infection, dehydration, she stayed for a week and was discharged home with vancomycin.  She was then seen and evaluated on 7/20 for similar generalized weakness, as well as hypomagnesemia.  She had negative stool studies on recent admission, and had stopped Dificid.  Diarrhea was improved on admission.  Patient had acute glycemia and has been having some labile blood sugar, Lantus was decreased at home.  She has been taking magnesium supplementation.  She stopped taking Imodium due to stomach upset.  She reports she has been having on and off ongoing diarrhea.  My evaluation patient denied pain, shortness of breath.  She is a poor historian but does answer questions, most of history is provided by her husband.   Dizziness Associated symptoms: weakness        Home Medications Prior to Admission medications   Medication Sig Start Date End Date Taking? Authorizing Provider  acetaminophen (TYLENOL) 325 MG tablet Take 2 tablets (650 mg total) by mouth every 6 (six) hours as needed for mild pain (or Fever >/= 101). 05/29/22   Swayze, Ava, DO  alendronate (FOSAMAX) 70 MG tablet TAKE 1 TABLET BY MOUTH ONCE A WEEK. TAKE WITH A FULL GLASS OF WATER ON AN EMPTY STOMACH. Patient taking differently: Take 70 mg by mouth once a week. 07/05/21   Saguier, Percell Miller, PA-C  amLODipine (NORVASC) 10 MG tablet Take 1 tablet (10 mg total) by mouth daily. 04/24/22   Saguier, Percell Miller, PA-C  busPIRone (BUSPAR) 7.5 MG tablet TAKE 1 TABLET BY MOUTH 2 TIMES DAILY. Patient taking  differently: Take 7.5 mg by mouth 2 (two) times daily. 03/03/22   Saguier, Percell Miller, PA-C  fenofibrate 160 MG tablet TAKE 1 TABLET BY MOUTH EVERY DAY Patient taking differently: Take 160 mg by mouth daily. 05/08/22 08/06/22  Saguier, Percell Miller, PA-C  gabapentin (NEURONTIN) 800 MG tablet Take 1 tablet (800 mg total) by mouth 2 (two) times daily. 04/24/22   Saguier, Percell Miller, PA-C  hydrALAZINE (APRESOLINE) 100 MG tablet Take 100 mg by mouth 2 (two) times daily. 05/17/22   [provider]  ibuprofen (ADVIL) 200 MG tablet Take 600 mg by mouth daily as needed (pain/aches).    [provider]  insulin detemir (LEVEMIR) 100 UNIT/ML injection Inject 0.1 mLs (10 Units total) into the skin at bedtime. 05/29/22   Swayze, Ava, DO  Insulin Pen Needle (BD PEN NEEDLE NANO U/F) 32G X 4 MM MISC Use one needle daily to inject insulin 12/23/21   Saguier, Percell Miller, PA-C  levothyroxine (SYNTHROID) 25 MCG tablet TAKE 1 TABLET BY MOUTH EVERY DAY BEFORE BREAKFAST Patient taking differently: Take 25 mcg by mouth daily before breakfast. 04/24/22   Saguier, Percell Miller, PA-C  losartan (COZAAR) 100 MG tablet TAKE 1 TABLET BY MOUTH EVERY DAY Patient taking differently: Take 100 mg by mouth daily. 12/07/21   Saguier, Percell Miller, PA-C  Magnesium Oxide -Mg Supplement 500 MG CAPS Take 1 capsule (500 mg total) by mouth 2 (two) times daily. For 7 days. 05/29/22   Mosie Lukes, MD  metFORMIN (GLUCOPHAGE) 1000 MG tablet Take 1 tablet (  1,000 mg total) by mouth 2 (two) times daily with a meal. 12/23/21   Saguier, Percell Miller, PA-C  metoprolol tartrate (LOPRESSOR) 50 MG tablet Take 50 mg by mouth 2 (two) times daily. 05/17/22   [provider]  nicotine (NICODERM CQ - DOSED IN MG/24 HOURS) 14 mg/24hr patch Place 1 patch (14 mg total) onto the skin daily. 05/30/22   Swayze, Ava, DO  pantoprazole (PROTONIX) 40 MG tablet TAKE 1 TABLET BY MOUTH EVERY DAY Patient taking differently: Take 40 mg by mouth daily. 05/19/22   Saguier, Percell Miller, PA-C   venlafaxine XR (EFFEXOR-XR) 75 MG 24 hr capsule TAKE 1 CAPSULE BY MOUTH DAILY WITH BREAKFAST. Patient taking differently: Take 75 mg by mouth daily with breakfast. 01/03/22   Saguier, Percell Miller, PA-C      Allergies    Patient has no known allergies.    Review of Systems   Review of Systems  Neurological:  Positive for dizziness and weakness.  All other systems reviewed and are negative.   Physical Exam Updated Vital Signs BP (!) 164/52   Pulse 64   Temp 98.3 F (36.8 C) (Oral)   Resp 14   SpO2 98%  Physical Exam Vitals and nursing note reviewed.  Constitutional:      General: She is not in acute distress.    Appearance: Normal appearance. She is ill-appearing.  HENT:     Head: Normocephalic and atraumatic.  Eyes:     General:        Right eye: No discharge.        Left eye: No discharge.  Cardiovascular:     Rate and Rhythm: Normal rate and regular rhythm.     Heart sounds: No murmur heard.    No friction rub. No gallop.  Pulmonary:     Effort: Pulmonary effort is normal.     Breath sounds: Normal breath sounds.  Abdominal:     General: Bowel sounds are normal.     Palpations: Abdomen is soft.  Skin:    General: Skin is warm and dry.     Capillary Refill: Capillary refill takes less than 2 seconds.  Neurological:     Mental Status: She is alert and oriented to person, place, and time.     Comments: Cranial nerves II through XII grossly intact.  Intact finger-nose, intact heel-to-shin. Alert and oriented x3.  Moves all 4 limbs spontaneously, normal coordination.  No pronator drift.  Decreased strength bilateral upper and lower extremities.  Romberg and gait not assessed secondary to weakness, dizziness.  Psychiatric:        Mood and Affect: Mood normal.        Behavior: Behavior normal.     ED Results / Procedures / Treatments   Labs (all labs ordered are listed, but only abnormal results are displayed) Labs Reviewed  CBC WITH DIFFERENTIAL/PLATELET - Abnormal;  Notable for the following components:      Result Value   Hemoglobin 10.6 (*)    HCT 32.5 (*)    Platelets 474 (*)    All other components within normal limits  COMPREHENSIVE METABOLIC PANEL - Abnormal; Notable for the following components:   CO2 19 (*)    Glucose, Bld 171 (*)    BUN 37 (*)    Creatinine, Ser 1.48 (*)    Alkaline Phosphatase 35 (*)    GFR, Estimated 38 (*)    All other components within normal limits  MAGNESIUM - Abnormal; Notable for the following components:  Magnesium 1.4 (*)    All other components within normal limits  URINALYSIS, ROUTINE W REFLEX MICROSCOPIC  AMMONIA    EKG EKG Interpretation  Date/Time:  Friday June 09 2022 16:41:30 EDT Ventricular Rate:  58 PR Interval:  114 QRS Duration: 100 QT Interval:  438 QTC Calculation: 431 R Axis:   22 Text Interpretation: Sinus rhythm Borderline short PR interval Nonspecific T abnrm, anterolateral leads NO sig change from prior tracing May 25 2022 Confirmed by Octaviano Glow [phone]) on 06/09/2022 5:04:55 PM  Radiology No results found.  Procedures Procedures    Medications Ordered in ED Medications  sodium chloride 0.9 % bolus 1,000 mL (1,000 mLs Intravenous New Bag/Given 06/09/22 1643)  magnesium sulfate IVPB 2 g 50 mL (2 g Intravenous New Bag/Given 06/09/22 1747)    ED Course/ Medical Decision Making/ A&P Clinical Course as of 06/09/22 1902  Fri Jun 09, 2022  1753 Dr. Reesa Chew requests CT head wo, UA, ammonia prior to admit. Will handoff to oncoming team. If no acute CT head findings will admit to medicine. [CP]    Clinical Course User Index [CP] Anselmo Pickler, PA-C                           Medical Decision Making Amount and/or Complexity of Data Reviewed Labs: ordered. ECG/medicine tests: ordered.   This patient is a 71 y.o. female who presents to the ED for concern of dizziness, weakness, intermittent ongoing diarrhea, this involves an extensive number of treatment options, and is a  complaint that carries with it a high risk of complications and morbidity. The emergent differential diagnosis prior to evaluation includes, but is not limited to, intracranial abnormality, UTI, ongoing dehydration, hypomagnesemia, AKI, glycemia, hyperammonemia, other acute intoxication versus other.  This is not an exhaustive differential.   Past Medical History / Co-morbidities / Social History: History of fatigue, hypercalcemia, hypomagnesemia, diabetes 2, acid reflux, hypertension, hyperlipidemia, tobacco abuse, still smoking half pack a day  Additional history: Chart reviewed. Pertinent results include: Reviewed recent admissions including lab work, imaging, patient with recent generalized weakness from dehydration, hypomagnesemia  Physical Exam: Physical exam performed. The pertinent findings include: Patient with GCS of 14-15, eyes open to speech or spontaneously.  No other significant neurologic deficits other than global weakness, no active ataxia noted.  Normal coordination noted.  Is some generalized tenderness to palpation of the abdomen without significant distention.  No rebound, rigidity, guarding throughout.  She is generally weak appearing.  Lab Tests: I ordered, and personally interpreted labs.  The pertinent results include: CBC notable for anemia hemoglobin 10.6, which is somewhat improved from recent baseline, additionally patient with increased platelets and white blood cells from recent baseline 11 days ago, likely hemoconcentrated in setting of acute dehydration.  Her CMP shows a likely metabolic acidosis developing with bicarb of 19, however no anion gap present at this time.  She does have some hyperglycemia today with glucose 171.  BUN and creatinine both elevated 37 and 1.48 respectively.  Does not meet criteria for acute kidney injury but patient is moving in that direction.  She is hypomagnesemic today with magnesium of 1.4 despite supplementation, given her significant  weakness and lethargy we will treat as though symptomatic with magnesium sulfate bolus at this time.  UA, ammonia pending at this time.   Imaging Studies: I ordered imaging studies including CT head without contrast.  Head without contrast pending at this time.   Cardiac  Monitoring:  The patient was maintained on a cardiac monitor.  My attending physician Dr. Langston Masker viewed and interpreted the cardiac monitored which showed an underlying rhythm of: NSR, no significant change from last tracing. I agree with this interpretation.   Medications: I ordered medication including magnesium sulfate for hypomagnesemia, fluid bolus for generalized dehydration.  Patient will need further reevaluation during her admission.  Consultations Obtained: I requested consultation with the hospitalist, spoke with Dr. Reesa Chew,  and discussed lab and imaging findings as well as pertinent plan - they recommend: CT head without contrast, urinalysis, ammonia prior to admit.  If no significant results on head CT, plan to admit to medicine   Disposition: After consideration of the diagnostic results and the patients response to treatment, I feel that patient should be admitted as discussed above.   I discussed this case with my attending physician Dr. Langston Masker who cosigned this note including patient's presenting symptoms, physical exam, and planned diagnostics and interventions. Attending physician stated agreement with plan or made changes to plan which were implemented.    7:02 PM [facility] [place] transferred to [facility] and Dr. Langston Masker at the end of my shift as the patient will require reassessment once labs/imaging have resulted. Patient presentation, ED course, and plan of care discussed with review of all pertinent labs and imaging. Please see his/her note for further details regarding further ED course and disposition. Plan at time of handoff is admission for failure to thrive, hypomagnesemia, dehydration of  unknown underlying cause pending CT head does not show acute stroke. This may be altered or completely changed at the discretion of the oncoming team pending results of further workup.   Final Clinical Impression(s) / ED Diagnoses Final diagnoses:  None    Rx / DC Orders ED Discharge Orders     None         Dorien Chihuahua 06/09/22 1902    Wyvonnia Dusky, MD 06/10/22 [phone]

## 2022-06-09 NOTE — ED Notes (Signed)
In to see Pt. And make her aware of [facility] on the way and that she is going to [facility]

## 2022-06-09 NOTE — Assessment & Plan Note (Addendum)
Continue toprol-xl 50 mg and norvasc 10 mg, hydralazine 100 mg. Hold losartan due to AKI.

## 2022-06-10 DIAGNOSIS — K219 Gastro-esophageal reflux disease without esophagitis: Secondary | ICD-10-CM | POA: Diagnosis present

## 2022-06-10 DIAGNOSIS — F17210 Nicotine dependence, cigarettes, uncomplicated: Secondary | ICD-10-CM | POA: Diagnosis present

## 2022-06-10 DIAGNOSIS — Z7983 Long term (current) use of bisphosphonates: Secondary | ICD-10-CM | POA: Diagnosis not present

## 2022-06-10 DIAGNOSIS — M797 Fibromyalgia: Secondary | ICD-10-CM | POA: Diagnosis present

## 2022-06-10 DIAGNOSIS — D638 Anemia in other chronic diseases classified elsewhere: Secondary | ICD-10-CM | POA: Diagnosis present

## 2022-06-10 DIAGNOSIS — R627 Adult failure to thrive: Secondary | ICD-10-CM | POA: Diagnosis present

## 2022-06-10 DIAGNOSIS — E1165 Type 2 diabetes mellitus with hyperglycemia: Secondary | ICD-10-CM | POA: Diagnosis present

## 2022-06-10 DIAGNOSIS — R197 Diarrhea, unspecified: Secondary | ICD-10-CM | POA: Diagnosis not present

## 2022-06-10 DIAGNOSIS — E876 Hypokalemia: Secondary | ICD-10-CM | POA: Diagnosis present

## 2022-06-10 DIAGNOSIS — Z79899 Other long term (current) drug therapy: Secondary | ICD-10-CM | POA: Diagnosis not present

## 2022-06-10 DIAGNOSIS — Z7984 Long term (current) use of oral hypoglycemic drugs: Secondary | ICD-10-CM | POA: Diagnosis not present

## 2022-06-10 DIAGNOSIS — F419 Anxiety disorder, unspecified: Secondary | ICD-10-CM | POA: Diagnosis present

## 2022-06-10 DIAGNOSIS — N179 Acute kidney failure, unspecified: Secondary | ICD-10-CM | POA: Diagnosis present

## 2022-06-10 DIAGNOSIS — Z794 Long term (current) use of insulin: Secondary | ICD-10-CM | POA: Diagnosis not present

## 2022-06-10 DIAGNOSIS — Z833 Family history of diabetes mellitus: Secondary | ICD-10-CM | POA: Diagnosis not present

## 2022-06-10 DIAGNOSIS — E785 Hyperlipidemia, unspecified: Secondary | ICD-10-CM | POA: Diagnosis present

## 2022-06-10 DIAGNOSIS — N390 Urinary tract infection, site not specified: Secondary | ICD-10-CM | POA: Diagnosis present

## 2022-06-10 DIAGNOSIS — E039 Hypothyroidism, unspecified: Secondary | ICD-10-CM | POA: Diagnosis present

## 2022-06-10 DIAGNOSIS — E860 Dehydration: Secondary | ICD-10-CM | POA: Diagnosis present

## 2022-06-10 DIAGNOSIS — Z955 Presence of coronary angioplasty implant and graft: Secondary | ICD-10-CM | POA: Diagnosis not present

## 2022-06-10 DIAGNOSIS — G9341 Metabolic encephalopathy: Secondary | ICD-10-CM | POA: Diagnosis present

## 2022-06-10 DIAGNOSIS — Z79890 Hormone replacement therapy (postmenopausal): Secondary | ICD-10-CM | POA: Diagnosis not present

## 2022-06-10 DIAGNOSIS — R112 Nausea with vomiting, unspecified: Secondary | ICD-10-CM | POA: Diagnosis not present

## 2022-06-10 DIAGNOSIS — I10 Essential (primary) hypertension: Secondary | ICD-10-CM | POA: Diagnosis present

## 2022-06-10 DIAGNOSIS — M810 Age-related osteoporosis without current pathological fracture: Secondary | ICD-10-CM | POA: Diagnosis present

## 2022-06-10 LAB — CBC WITH DIFFERENTIAL/PLATELET
Abs Immature Granulocytes: 0.02 10*3/uL (ref 0.00–0.07)
Basophils Absolute: 0.1 10*3/uL (ref 0.0–0.1)
Basophils Relative: 1 %
Eosinophils Absolute: 0.1 10*3/uL (ref 0.0–0.5)
Eosinophils Relative: 1 %
HCT: 29.9 % — ABNORMAL LOW (ref 36.0–46.0)
Hemoglobin: 9.6 g/dL — ABNORMAL LOW (ref 12.0–15.0)
Immature Granulocytes: 0 %
Lymphocytes Relative: 19 %
Lymphs Abs: 1.3 10*3/uL (ref 0.7–4.0)
MCH: 26.6 pg (ref 26.0–34.0)
MCHC: 32.1 g/dL (ref 30.0–36.0)
MCV: 82.8 fL (ref 80.0–100.0)
Monocytes Absolute: 0.4 10*3/uL (ref 0.1–1.0)
Monocytes Relative: 6 %
Neutro Abs: 4.8 10*3/uL (ref 1.7–7.7)
Neutrophils Relative %: 73 %
Platelets: 351 10*3/uL (ref 150–400)
RBC: 3.61 MIL/uL — ABNORMAL LOW (ref 3.87–5.11)
RDW: 14.4 % (ref 11.5–15.5)
WBC: 6.6 10*3/uL (ref 4.0–10.5)
nRBC: 0 % (ref 0.0–0.2)

## 2022-06-10 LAB — COMPREHENSIVE METABOLIC PANEL
ALT: 14 U/L (ref 0–44)
AST: 20 U/L (ref 15–41)
Albumin: 4 g/dL (ref 3.5–5.0)
Alkaline Phosphatase: 30 U/L — ABNORMAL LOW (ref 38–126)
Anion gap: 10 (ref 5–15)
BUN: 29 mg/dL — ABNORMAL HIGH (ref 8–23)
CO2: 20 mmol/L — ABNORMAL LOW (ref 22–32)
Calcium: 9.2 mg/dL (ref 8.9–10.3)
Chloride: 106 mmol/L (ref 98–111)
Creatinine, Ser: 1 mg/dL (ref 0.44–1.00)
GFR, Estimated: >60 mL/min (ref >60)
Glucose, Bld: 105 mg/dL — ABNORMAL HIGH (ref 70–99)
Potassium: 3.4 mmol/L — ABNORMAL LOW (ref 3.5–5.1)
Sodium: 136 mmol/L (ref 135–145)
Total Bilirubin: 0.5 mg/dL (ref 0.3–1.2)
Total Protein: 7 g/dL (ref 6.5–8.1)

## 2022-06-10 LAB — VITAMIN B12: Vitamin B-12: 337 pg/mL (ref 180–914)

## 2022-06-10 LAB — FOLATE: Folate: 17.5 ng/mL (ref >5.9)

## 2022-06-10 LAB — GLUCOSE, CAPILLARY
Glucose-Capillary: 101 mg/dL — ABNORMAL HIGH (ref 70–99)
Glucose-Capillary: 109 mg/dL — ABNORMAL HIGH (ref 70–99)
Glucose-Capillary: 151 mg/dL — ABNORMAL HIGH (ref 70–99)
Glucose-Capillary: 159 mg/dL — ABNORMAL HIGH (ref 70–99)
Glucose-Capillary: 165 mg/dL — ABNORMAL HIGH (ref 70–99)

## 2022-06-10 LAB — TSH: TSH: 2.297 u[IU]/mL (ref 0.350–4.500)

## 2022-06-10 LAB — MAGNESIUM: Magnesium: 2.2 mg/dL (ref 1.7–2.4)

## 2022-06-10 MED ORDER — CEPHALEXIN 500 MG PO CAPS
500.0000 mg | ORAL_CAPSULE | Freq: Three times a day (TID) | ORAL | Status: DC
  Administered 2022-06-10 – 2022-06-13 (×8): 500 mg via ORAL
  Filled 2022-06-10 (×8): qty 1

## 2022-06-10 MED ORDER — METOPROLOL TARTRATE 5 MG/5ML IV SOLN: 5.0000 mg | INTRAVENOUS | Status: DC | PRN

## 2022-06-10 MED ORDER — IPRATROPIUM-ALBUTEROL 0.5-2.5 (3) MG/3ML IN SOLN: 3.0000 mL | RESPIRATORY_TRACT | Status: DC | PRN

## 2022-06-10 MED ORDER — GUAIFENESIN 100 MG/5ML PO LIQD: 5.0000 mL | ORAL | Status: DC | PRN

## 2022-06-10 MED ORDER — POTASSIUM CHLORIDE 20 MEQ PO PACK
40.0000 meq | PACK | Freq: Once | ORAL | Status: AC
  Administered 2022-06-10: 40 meq via ORAL
  Filled 2022-06-10: qty 2

## 2022-06-10 MED ORDER — HYDRALAZINE HCL 20 MG/ML IJ SOLN: 10.0000 mg | INTRAMUSCULAR | Status: DC | PRN

## 2022-06-10 MED ORDER — SENNOSIDES-DOCUSATE SODIUM 8.6-50 MG PO TABS: 1.0000 | ORAL_TABLET | Freq: Every evening | ORAL | Status: DC | PRN

## 2022-06-10 MED ORDER — SODIUM CHLORIDE 0.9 % IV SOLN: INTRAVENOUS | Status: AC

## 2022-06-10 NOTE — Evaluation (Signed)
Physical Therapy Evaluation Patient Details Name: Jasmine Keith MRN: [id number] DOB: 04/01/1951 Today's Date: 06/10/2022  History of Present Illness  70-year with history of HTN, DM 2, tobacco abuse, reflux disease, hypothyroidism brought to the hospital for weakness, failure to thrive and lethargy.  Clinical Impression  Pt admitted with above diagnosis.  Pt currently with functional limitations due to the deficits listed below (see PT Problem List). Pt will benefit from skilled PT to increase their independence and safety with mobility to allow discharge to the venue listed below.  Pt c/o weakness and fatigue in her legs and required RW.  Recommend RW.  Family is thinking about HHPT.        Recommendations for follow up therapy are one component of a multi-disciplinary discharge planning process, led by the attending physician.  Recommendations may be updated based on patient status, additional functional criteria and insurance authorization.  Follow Up Recommendations Home health PT (Pt is thinking about it.  She may not need it- depending on progress)      Assistance Recommended at Discharge PRN  Patient can return home with the following  A little help with walking and/or transfers    Equipment Recommendations Rolling walker (2 wheels)  Recommendations for Other Services       Functional Status Assessment Patient has had a recent decline in their functional status and demonstrates the ability to make significant improvements in function in a reasonable and predictable amount of time.     Precautions / Restrictions Precautions Precautions: Fall      Mobility  Bed Mobility Overal bed mobility: Needs Assistance Bed Mobility: Supine to Sit     Supine to sit: Min guard     General bed mobility comments: Increased time    Transfers Overall transfer level: Needs assistance Equipment used: Rolling walker (2 wheels) Transfers: Sit to/from Stand Sit to Stand: Min  guard           General transfer comment: cues for hand placement    Ambulation/Gait Ambulation/Gait assistance: Min guard Gait Distance (Feet): 50 Feet Assistive device: Rolling walker (2 wheels) Gait Pattern/deviations: Decreased step length - right, Decreased step length - left Gait velocity: decreased     General Gait Details: Pt c/o weakness and fatigue in her legs.  Stairs            Wheelchair Mobility    Modified Rankin (Stroke Patients Only)       Balance Overall balance assessment: Mild deficits observed, not formally tested                                           Pertinent Vitals/Pain Pain Assessment Pain Assessment: No/denies pain    Home Living Family/patient expects to be discharged to:: Private residence Living Arrangements: Spouse/significant other Available Help at Discharge: Family Type of Home: House Home Access: Level entry       Home Layout: One level Home Equipment: Kasandra Knudsen - single point      Prior Function Prior Level of Function : Independent/Modified Independent                     Hand Dominance        Extremity/Trunk Assessment   Upper Extremity Assessment Upper Extremity Assessment: Generalized weakness    Lower Extremity Assessment Lower Extremity Assessment: Generalized weakness       Communication  Communication: No difficulties  Cognition Arousal/Alertness: Awake/alert Behavior During Therapy: Flat affect Overall Cognitive Status: Within Functional Limits for tasks assessed                                          General Comments      Exercises     Assessment/Plan    PT Assessment Patient needs continued PT services  PT Problem List Decreased strength;Decreased activity tolerance;Decreased mobility;Decreased balance       PT Treatment Interventions DME instruction;Gait training;Stair training;Functional mobility training;Therapeutic  activities;Therapeutic exercise;Balance training    PT Goals (Current goals can be found in the Care Plan section)  Acute Rehab PT Goals Patient Stated Goal: Feel better PT Goal Formulation: With patient/family Time For Goal Achievement: 06/24/22 Potential to Achieve Goals: Good    Frequency Min 3X/week     Co-evaluation               AM-PAC PT "6 Clicks" Mobility  Outcome Measure Help needed turning from your back to your side while in a flat bed without using bedrails?: None Help needed moving from lying on your back to sitting on the side of a flat bed without using bedrails?: None Help needed moving to and from a bed to a chair (including a wheelchair)?: A Little Help needed standing up from a chair using your arms (e.g., wheelchair or bedside chair)?: A Little Help needed to walk in hospital room?: A Little Help needed climbing 3-5 steps with a railing? : A Little 6 Click Score: 20    End of Session Equipment Utilized During Treatment: Gait belt Activity Tolerance: Patient limited by fatigue Patient left: in chair;with call bell/phone within reach;with chair alarm set Nurse Communication: Mobility status PT Visit Diagnosis: Muscle weakness (generalized) (M62.81);Difficulty in walking, not elsewhere classified (R26.2)    Time: 3825-0539 PT Time Calculation (min) (ACUTE ONLY): 20 min   Charges:   PT Evaluation $PT Eval Moderate Complexity: 1 Mod          Aribella Vavra L. Tamala Julian, PT  06/10/2022   Galen Manila 06/10/2022, 1:32 PM

## 2022-06-10 NOTE — Progress Notes (Signed)
PROGRESS NOTE    Jasmine Keith  ACZ:[id number] DOB: 1950/12/18 DOA: 06/09/2022 PCP: Mackie Pai, PA-C   Brief Narrative:  51-year with history of HTN, DM 2, tobacco abuse, reflux disease, hypothyroidism brought to the hospital for weakness, failure to thrive and lethargy.  Patient was admitted to the hospital for similar reasons about 2 weeks ago thereafter discharged home.  Despite of taking Imodium at home she is still having diarrhea.  Upon admission CT of the head was negative, ammonia levels were normal.   Assessment & Plan:  Principal Problem:   AKI (acute kidney injury) ([place]) Active Problems:   Dehydration   Hypomagnesemia   DM2 (diabetes mellitus, type 2) (HCC)   GERD (gastroesophageal reflux disease)   HTN (hypertension)   Hypothyroidism   Tobacco abuse     Assessment and Plan: * AKI (acute kidney injury) ([place]) -Initial creatinine 1.48, baseline 1.0.  This morning improved with IV fluids.  Symptomatic bacteriuria - Although UA improved from previous UA she reports of burning sensation with dysuria.  We will empirically treat her with 5 days of oral Keflex.  Hypomagnesemia/hypokalemia Aggressive repletion  Acute metabolic encephalopathy, resolved - Likely from dehydration.  Ammonia levels normal.  CT head negative.  Appears she has mild UTI but improved from previous UA - Check TSH, B12 and folate  Dehydration and failure to thrive Continue IV fluids.  Encourage oral diet.  Tobacco abuse Nicotine patch as needed  Hypothyroidism Continue synthroid 25 mcg daily.  HTN (hypertension) Current medications Norvasc 10 mg daily, Toprol-XL 50 mg daily.  Home losartan on hold.  IV as needed ordered  GERD (gastroesophageal reflux disease) Continue protonix 40 mg.  DM2 (diabetes mellitus, type 2) (HCC) Sliding scale and Accu-Cheks.  Home Levemir on hold  PT/OT   DVT prophylaxis: SQ Heparin Code Status: Full Code Family Communication:       Subjective:  Patient feels better this morning.  Slightly more energetic.  Concerned why this keeps happening again and again to her.  Overall does feel weak but better compared to yesterday.  Denies any diarrhea.   Examination:  General exam: Appears calm and comfortable  Respiratory system: Clear to auscultation. Respiratory effort normal. Cardiovascular system: S1 & S2 heard, RRR. No JVD, murmurs, rubs, gallops or clicks. No pedal edema. Gastrointestinal system: Abdomen is nondistended, soft and nontender. No organomegaly or masses felt. Normal bowel sounds heard. Central nervous system: Alert and oriented. No focal neurological deficits. Extremities: Symmetric 5 x 5 power. Skin: No rashes, lesions or ulcers Psychiatry: Judgement and insight appear normal. Mood & affect appropriate.     Objective: Vitals:   06/09/22 2100 06/09/22 2200 06/10/22 0156 06/10/22 0602  BP: (!) 176/44 (!) 185/52 (!) 153/57 (!) 174/53  Pulse: 66 62 86 88  Resp: '17 14 16 16  '$ Temp:  98.1 F (36.7 C) 98.2 F (36.8 C) 98 F (36.7 C)  TempSrc:  Oral Oral Oral  SpO2: 98% 97% 98% 98%  Weight:  50.6 kg    Height:  '5\' 1"'$  (1.549 m)      Intake/Output Summary (Last 24 hours) at 06/10/2022 0826 Last data filed at 06/10/2022 1093 Gross per 24 hour  Intake 1659.25 ml  Output --  Net 1659.25 ml   Filed Weights   06/09/22 2200  Weight: 50.6 kg     Data Reviewed:   CBC: Recent Labs  Lab 06/09/22 1636 06/10/22 0600  WBC 8.5 6.6  NEUTROABS 6.7 4.8  HGB 10.6* 9.6*  HCT  32.5* 29.9*  MCV 81.3 82.8  PLT 474* 621   Basic Metabolic Panel: Recent Labs  Lab 06/09/22 1636 06/10/22 0600  NA 137 136  K 4.2 3.4*  CL 108 106  CO2 19* 20*  GLUCOSE 171* 105*  BUN 37* 29*  CREATININE 1.48* 1.00  CALCIUM 9.7 9.2  MG 1.4* 2.2   GFR: Estimated Creatinine Clearance: 40.1 mL/min (by C-G formula based on SCr of 1 mg/dL). Liver Function Tests: Recent Labs  Lab 06/09/22 1636 06/10/22 0600  AST  29 20  ALT 16 14  ALKPHOS 35* 30*  BILITOT 0.4 0.5  PROT 8.0 7.0  ALBUMIN 4.3 4.0   No results for input(s): "LIPASE", "AMYLASE" in the last 168 hours. Recent Labs  Lab 06/09/22 1905  AMMONIA 21   Coagulation Profile: No results for input(s): "INR", "PROTIME" in the last 168 hours. Cardiac Enzymes: No results for input(s): "CKTOTAL", "CKMB", "CKMBINDEX", "TROPONINI" in the last 168 hours. BNP (last 3 results) No results for input(s): "PROBNP" in the last 8760 hours. HbA1C: No results for input(s): "HGBA1C" in the last 72 hours. CBG: Recent Labs  Lab 06/10/22 0005 06/10/22 0759  GLUCAP 109* 101*   Lipid Profile: No results for input(s): "CHOL", "HDL", "LDLCALC", "TRIG", "CHOLHDL", "LDLDIRECT" in the last 72 hours. Thyroid Function Tests: No results for input(s): "TSH", "T4TOTAL", "FREET4", "T3FREE", "THYROIDAB" in the last 72 hours. Anemia Panel: No results for input(s): "VITAMINB12", "FOLATE", "FERRITIN", "TIBC", "IRON", "RETICCTPCT" in the last 72 hours. Sepsis Labs: No results for input(s): "PROCALCITON", "LATICACIDVEN" in the last 168 hours.  No results found for this or any previous visit (from the past 240 hour(s)).       Radiology Studies: CT Head Wo Contrast  Result Date: 06/09/2022 CLINICAL DATA:  Mental status change, unknown cause EXAM: CT HEAD WITHOUT CONTRAST TECHNIQUE: Contiguous axial images were obtained from the base of the skull through the vertex without intravenous contrast. RADIATION DOSE REDUCTION: This exam was performed according to the departmental dose-optimization program which includes automated exposure control, adjustment of the mA and/or kV according to patient size and/or use of iterative reconstruction technique. COMPARISON:  CT head March 02, 2021. FINDINGS: Brain: No evidence of acute infarction, hemorrhage, hydrocephalus, extra-axial collection or mass lesion/mass effect. Partially empty sella. Vascular: No hyperdense vessel identified.  Skull: No acute fracture. Sinuses/Orbits: Clear sinuses.  No acute orbital findings. Other: No mastoid effusions. IMPRESSION: No evidence of acute intracranial abnormality. Electronically Signed   By: Margaretha Sheffield M.D.   On: 06/09/2022 19:00        Scheduled Meds:  amLODipine  10 mg Oral Daily   dicyclomine  10 mg Oral TID AC & HS   feeding supplement  237 mL Oral BID BM   fenofibrate  160 mg Oral Daily   heparin  5,000 Units Subcutaneous Q8H   hydrALAZINE  100 mg Oral BID   insulin aspart  0-5 Units Subcutaneous QHS   insulin aspart  0-9 Units Subcutaneous TID WC   levothyroxine  25 mcg Oral QAC breakfast   metoprolol succinate  50 mg Oral Daily   potassium chloride  40 mEq Oral Once   Ensure Max Protein  11 oz Oral Daily   Continuous Infusions:  sodium chloride       LOS: 0 days   Time spent= 35 mins    Elif Yonts Arsenio Loader, MD [facility]  If 7PM-7AM, please contact night-coverage  06/10/2022, 8:26 AM

## 2022-06-11 DIAGNOSIS — N179 Acute kidney failure, unspecified: Secondary | ICD-10-CM | POA: Diagnosis not present

## 2022-06-11 LAB — GLUCOSE, CAPILLARY
Glucose-Capillary: 182 mg/dL — ABNORMAL HIGH (ref 70–99)
Glucose-Capillary: 154 mg/dL — ABNORMAL HIGH (ref 70–99)
Glucose-Capillary: 125 mg/dL — ABNORMAL HIGH (ref 70–99)
Glucose-Capillary: 125 mg/dL — ABNORMAL HIGH (ref 70–99)
Glucose-Capillary: 267 mg/dL — ABNORMAL HIGH (ref 70–99)

## 2022-06-11 MED ORDER — PANTOPRAZOLE SODIUM 40 MG PO TBEC
40.0000 mg | DELAYED_RELEASE_TABLET | Freq: Every day | ORAL | Status: DC
  Administered 2022-06-11: 40 mg via ORAL
  Filled 2022-06-11: qty 1

## 2022-06-11 MED ORDER — METOCLOPRAMIDE HCL 5 MG/ML IJ SOLN
10.0000 mg | Freq: Three times a day (TID) | INTRAMUSCULAR | Status: DC
  Administered 2022-06-11 – 2022-06-13 (×6): 10 mg via INTRAVENOUS
  Filled 2022-06-11 (×6): qty 2

## 2022-06-11 MED ORDER — SODIUM CHLORIDE 0.9 % IV SOLN
INTRAVENOUS | Status: DC
Start: 2022-06-11 — End: 2022-06-12
  Administered 2022-06-11: 1000 mL via INTRAVENOUS

## 2022-06-11 MED ORDER — VENLAFAXINE HCL ER 75 MG PO CP24
75.0000 mg | ORAL_CAPSULE | Freq: Every day | ORAL | Status: DC
  Administered 2022-06-11 – 2022-06-13 (×3): 75 mg via ORAL
  Filled 2022-06-11 (×3): qty 1

## 2022-06-11 MED ORDER — MECLIZINE HCL 25 MG PO TABS
25.0000 mg | ORAL_TABLET | Freq: Three times a day (TID) | ORAL | Status: DC | PRN
  Administered 2022-06-11: 25 mg via ORAL
  Filled 2022-06-11: qty 1

## 2022-06-11 MED ORDER — BUSPIRONE HCL 5 MG PO TABS
7.5000 mg | ORAL_TABLET | Freq: Two times a day (BID) | ORAL | Status: DC
  Administered 2022-06-11 – 2022-06-13 (×5): 7.5 mg via ORAL
  Filled 2022-06-11 (×5): qty 2

## 2022-06-11 MED ORDER — GABAPENTIN 400 MG PO CAPS
800.0000 mg | ORAL_CAPSULE | Freq: Two times a day (BID) | ORAL | Status: DC
Start: 2022-06-11 — End: 2022-06-12
  Administered 2022-06-11 (×2): 800 mg via ORAL
  Filled 2022-06-11 (×2): qty 2

## 2022-06-11 MED ORDER — PANTOPRAZOLE SODIUM 40 MG PO TBEC
40.0000 mg | DELAYED_RELEASE_TABLET | Freq: Two times a day (BID) | ORAL | Status: DC
  Administered 2022-06-12 – 2022-06-13 (×3): 40 mg via ORAL
  Filled 2022-06-11 (×3): qty 1

## 2022-06-11 NOTE — Progress Notes (Signed)
Pt woke up this morning, dry heaving, shaking and moaning - zofran given with + effect, but was still moaning and grabbing at RN when attempting to leave the bedside - Will not say what is wrong - says "I cant", but does indicate she is now dizzy and anxious.  CBG done - 182.  b/p elevated at 199/71, but other vitals WNL and NSR on tele. Pt refusing AM meds (heparin, keflex and synthroid). Dr. Clearence Ped notified - BMP, CBC and Mag level already ordered, Troponin now ordered as well.  She is now calm and resting without s/s distress, and to have dayshift address her anxiety (she has h/o and takes buspar and effexor, which have not been given this admission).  Pt is now also refusing AM labs x3 attempts.

## 2022-06-11 NOTE — Progress Notes (Signed)
Patient states feeling better after eating some dinner.

## 2022-06-11 NOTE — Progress Notes (Signed)
PROGRESS NOTE    Jasmine Keith  LFY:[id number] DOB: [birth year] DOA: 06/09/2022 PCP: Mackie Pai, PA-C   Brief Narrative:  78-year with history of HTN, DM 2, tobacco abuse, reflux disease, hypothyroidism brought to the hospital for weakness, failure to thrive and lethargy.  Patient was admitted to the hospital for similar reasons about 2 weeks ago thereafter discharged home.  Despite of taking Imodium at home she is still having diarrhea.  Upon admission CT of the head was negative, ammonia levels were normal.   Assessment & Plan:  Principal Problem:   AKI (acute kidney injury) ([place]) Active Problems:   Dehydration   Hypomagnesemia   DM2 (diabetes mellitus, type 2) (HCC)   GERD (gastroesophageal reflux disease)   HTN (hypertension)   Hypothyroidism   Tobacco abuse     Assessment and Plan: * AKI (acute kidney injury) ([place]) -Initial creatinine 1.48, baseline 1.0.  This morning improved with IV fluids.  Intermittent nausea vomiting diarrhea - Consulted [facility].  We will plan on seeing the patient later today or tomorrow.  We will keep her n.p.o. past midnight.  Scheduled Reglan, PPI twice daily. EGD/colonoscopy in 2022-showed duodenitis/gastritis and colonic polyp.  Negative for H. pylori.  Symptomatic bacteriuria - Although UA improved from previous UA she reports of burning sensation with dysuria.  We will empirically treat her with 5 days of oral Keflex.  Hypomagnesemia/hypokalemia Aggressive repletion  Acute metabolic encephalopathy, resolved - Likely from dehydration.  Ammonia levels normal.  CT head negative.  Appears she has mild UTI but improved from previous UA -TSH B12 folate normal  Dehydration and failure to thrive Continue IV fluids.  Encourage oral diet.  Tobacco abuse Nicotine patch as needed  Hypothyroidism Continue synthroid 25 mcg daily.  HTN (hypertension) Current medications Norvasc 10 mg daily, Toprol-XL 50 mg daily.  Home losartan on hold.   IV as needed ordered  GERD (gastroesophageal reflux disease) Continue protonix 40 mg.  DM2 (diabetes mellitus, type 2) (HCC) Sliding scale and Accu-Cheks.  Home Levemir on hold  PT/OT   DVT prophylaxis: SQ Heparin Code Status: Full Code Family Communication: Husband at bedside  Ongoing GI evaluation  Subjective: Had episode of agitation thereafter nausea and vomiting in the morning.  Examination:  Constitutional: Not in acute distress Respiratory: Clear to auscultation bilaterally Cardiovascular: Normal sinus rhythm, no rubs Abdomen: Nontender nondistended good bowel sounds Musculoskeletal: No edema noted Skin: No rashes seen Neurologic: CN 2-12 grossly intact.  And nonfocal Psychiatric: Normal judgment and insight. Alert and oriented x 3. Normal mood.   Objective: Vitals:   06/10/22 1414 06/10/22 2154 06/11/22 0500 06/11/22 0606  BP: 131/83 (!) 160/66  (!) 199/71  Pulse: 91 61  82  Resp: '17 14  14  '$ Temp: 98 F (36.7 C) 98.2 F (36.8 C)  97.6 F (36.4 C)  TempSrc: Oral Oral  Oral  SpO2: 96% 98%  97%  Weight:   50.5 kg   Height:        Intake/Output Summary (Last 24 hours) at 06/11/2022 1251 Last data filed at 06/11/2022 8527 Gross per 24 hour  Intake 1447.79 ml  Output 5 ml  Net 1442.79 ml   Filed Weights   06/09/22 2200 06/11/22 0500  Weight: 50.6 kg 50.5 kg     Data Reviewed:   CBC: Recent Labs  Lab 06/09/22 1636 06/10/22 0600  WBC 8.5 6.6  NEUTROABS 6.7 4.8  HGB 10.6* 9.6*  HCT 32.5* 29.9*  MCV 81.3 82.8  PLT 474* 351  Basic Metabolic Panel: Recent Labs  Lab 06/09/22 1636 06/10/22 0600  NA 137 136  K 4.2 3.4*  CL 108 106  CO2 19* 20*  GLUCOSE 171* 105*  BUN 37* 29*  CREATININE 1.48* 1.00  CALCIUM 9.7 9.2  MG 1.4* 2.2   GFR: Estimated Creatinine Clearance: 40.1 mL/min (by C-G formula based on SCr of 1 mg/dL). Liver Function Tests: Recent Labs  Lab 06/09/22 1636 06/10/22 0600  AST 29 20  ALT 16 14  ALKPHOS 35* 30*   BILITOT 0.4 0.5  PROT 8.0 7.0  ALBUMIN 4.3 4.0   No results for input(s): "LIPASE", "AMYLASE" in the last 168 hours. Recent Labs  Lab 06/09/22 1905  AMMONIA 21   Coagulation Profile: No results for input(s): "INR", "PROTIME" in the last 168 hours. Cardiac Enzymes: No results for input(s): "CKTOTAL", "CKMB", "CKMBINDEX", "TROPONINI" in the last 168 hours. BNP (last 3 results) No results for input(s): "PROBNP" in the last 8760 hours. HbA1C: No results for input(s): "HGBA1C" in the last 72 hours. CBG: Recent Labs  Lab 06/10/22 1652 06/10/22 2155 06/11/22 0546 06/11/22 0758 06/11/22 1201  GLUCAP 151* 165* 182* 154* 125*   Lipid Profile: No results for input(s): "CHOL", "HDL", "LDLCALC", "TRIG", "CHOLHDL", "LDLDIRECT" in the last 72 hours. Thyroid Function Tests: Recent Labs    06/10/22 0600  TSH 2.297   Anemia Panel: Recent Labs    06/10/22 0908  VITAMINB12 337  FOLATE 17.5   Sepsis Labs: No results for input(s): "PROCALCITON", "LATICACIDVEN" in the last 168 hours.  No results found for this or any previous visit (from the past 240 hour(s)).       Radiology Studies: CT Head Wo Contrast  Result Date: 06/09/2022 CLINICAL DATA:  Mental status change, unknown cause EXAM: CT HEAD WITHOUT CONTRAST TECHNIQUE: Contiguous axial images were obtained from the base of the skull through the vertex without intravenous contrast. RADIATION DOSE REDUCTION: This exam was performed according to the departmental dose-optimization program which includes automated exposure control, adjustment of the mA and/or kV according to patient size and/or use of iterative reconstruction technique. COMPARISON:  CT head March 02, 2021. FINDINGS: Brain: No evidence of acute infarction, hemorrhage, hydrocephalus, extra-axial collection or mass lesion/mass effect. Partially empty sella. Vascular: No hyperdense vessel identified. Skull: No acute fracture. Sinuses/Orbits: Clear sinuses.  No acute orbital  findings. Other: No mastoid effusions. IMPRESSION: No evidence of acute intracranial abnormality. Electronically Signed   By: Margaretha Sheffield M.D.   On: 06/09/2022 19:00        Scheduled Meds:  amLODipine  10 mg Oral Daily   busPIRone  7.5 mg Oral BID   cephALEXin  500 mg Oral Q8H   dicyclomine  10 mg Oral TID AC & HS   feeding supplement  237 mL Oral BID BM   fenofibrate  160 mg Oral Daily   gabapentin  800 mg Oral BID   heparin  5,000 Units Subcutaneous Q8H   hydrALAZINE  100 mg Oral BID   insulin aspart  0-5 Units Subcutaneous QHS   insulin aspart  0-9 Units Subcutaneous TID WC   levothyroxine  25 mcg Oral QAC breakfast   metoCLOPramide (REGLAN) injection  10 mg Intravenous Q8H   metoprolol succinate  50 mg Oral Daily   pantoprazole  40 mg Oral BID AC   Ensure Max Protein  11 oz Oral Daily   venlafaxine XR  75 mg Oral Q breakfast   Continuous Infusions:     LOS: 1 day  Time spent= 35 mins    Yahye Siebert Arsenio Loader, MD [facility]  If 7PM-7AM, please contact night-coverage  06/11/2022, 12:51 PM

## 2022-06-11 NOTE — Progress Notes (Signed)
OT Cancellation Note  Patient Details Name: Jasmine Keith MRN: [id number] DOB: January 13, 1951   Cancelled Treatment:    Reason Eval/Treat Not Completed: Patient declined, no reason specified Patient declined for therapist to assist with getting to [facility]. NT in room at this time to assist patient. OT to continue to follow and check back as schedule will allow.  Jackelyn Poling OTR/L, [place] Acute Rehabilitation Department Office# [phone] Pager# [phone]  06/11/2022, 2:46 PM

## 2022-06-12 ENCOUNTER — Ambulatory Visit: Payer: Medicare Other | Admitting: Medical

## 2022-06-12 DIAGNOSIS — N179 Acute kidney failure, unspecified: Secondary | ICD-10-CM | POA: Diagnosis not present

## 2022-06-12 DIAGNOSIS — R197 Diarrhea, unspecified: Secondary | ICD-10-CM

## 2022-06-12 DIAGNOSIS — R112 Nausea with vomiting, unspecified: Secondary | ICD-10-CM

## 2022-06-12 LAB — BASIC METABOLIC PANEL
Anion gap: 5 (ref 5–15)
BUN: 21 mg/dL (ref 8–23)
CO2: 20 mmol/L — ABNORMAL LOW (ref 22–32)
Calcium: 8.7 mg/dL — ABNORMAL LOW (ref 8.9–10.3)
Chloride: 117 mmol/L — ABNORMAL HIGH (ref 98–111)
Creatinine, Ser: 1.04 mg/dL — ABNORMAL HIGH (ref 0.44–1.00)
GFR, Estimated: 58 mL/min — ABNORMAL LOW (ref >60)
Glucose, Bld: 112 mg/dL — ABNORMAL HIGH (ref 70–99)
Potassium: 3.7 mmol/L (ref 3.5–5.1)
Sodium: 142 mmol/L (ref 135–145)

## 2022-06-12 LAB — GLUCOSE, CAPILLARY
Glucose-Capillary: 122 mg/dL — ABNORMAL HIGH (ref 70–99)
Glucose-Capillary: 185 mg/dL — ABNORMAL HIGH (ref 70–99)
Glucose-Capillary: 179 mg/dL — ABNORMAL HIGH (ref 70–99)
Glucose-Capillary: 235 mg/dL — ABNORMAL HIGH (ref 70–99)

## 2022-06-12 LAB — CBC
HCT: 28 % — ABNORMAL LOW (ref 36.0–46.0)
Hemoglobin: 8.9 g/dL — ABNORMAL LOW (ref 12.0–15.0)
MCH: 27 pg (ref 26.0–34.0)
MCHC: 31.8 g/dL (ref 30.0–36.0)
MCV: 84.8 fL (ref 80.0–100.0)
Platelets: 323 10*3/uL (ref 150–400)
RBC: 3.3 MIL/uL — ABNORMAL LOW (ref 3.87–5.11)
RDW: 14.6 % (ref 11.5–15.5)
WBC: 6.4 10*3/uL (ref 4.0–10.5)
nRBC: 0 % (ref 0.0–0.2)

## 2022-06-12 LAB — MAGNESIUM: Magnesium: 1.7 mg/dL (ref 1.7–2.4)

## 2022-06-12 MED ORDER — MAGNESIUM SULFATE 2 GM/50ML IV SOLN
2.0000 g | Freq: Once | INTRAVENOUS | Status: AC
  Administered 2022-06-12: 2 g via INTRAVENOUS
  Filled 2022-06-12: qty 50

## 2022-06-12 MED ORDER — SODIUM CHLORIDE 0.9 % IV SOLN: INTRAVENOUS | Status: AC

## 2022-06-12 MED ORDER — GABAPENTIN 400 MG PO CAPS
400.0000 mg | ORAL_CAPSULE | Freq: Two times a day (BID) | ORAL | Status: DC
  Administered 2022-06-12 – 2022-06-13 (×3): 400 mg via ORAL
  Filled 2022-06-12 (×3): qty 1

## 2022-06-12 MED ORDER — BOOST / RESOURCE BREEZE PO LIQD CUSTOM: 1.0000 | ORAL | Status: DC

## 2022-06-12 MED ORDER — ENSURE ENLIVE PO LIQD
237.0000 mL | Freq: Two times a day (BID) | ORAL | Status: DC
Start: 2022-06-12 — End: 2022-06-13
  Administered 2022-06-12: 237 mL via ORAL

## 2022-06-12 MED ORDER — ADULT MULTIVITAMIN W/MINERALS CH
1.0000 | ORAL_TABLET | Freq: Every day | ORAL | Status: DC
  Administered 2022-06-12 – 2022-06-13 (×2): 1 via ORAL
  Filled 2022-06-12 (×2): qty 1

## 2022-06-12 NOTE — Evaluation (Signed)
Occupational Therapy Evaluation Patient Details Name: Jasmine Keith MRN: [id number] DOB: [birth year] Today's Date: 06/12/2022   History of Present Illness 70-year with history of HTN, DM 2, tobacco abuse, reflux disease, hypothyroidism brought to the hospital for weakness, failure to thrive and lethargy.   Clinical Impression   Patient is a 71 year old female who was admitted for above. Patient was living at home with husband prior level with independence in ADLs. Patient was min guard for toileting tasks with MI for bed mobility and LB dressing sitting EOB after set up. Patient anticipated to progress while in hospital to not need OT follow up in next level of care.  Patient would continue to benefit from skilled OT services at this time while admitted to address noted deficits in order to improve overall safety and independence in ADLs.       Recommendations for follow up therapy are one component of a multi-disciplinary discharge planning process, led by the attending physician.  Recommendations may be updated based on patient status, additional functional criteria and insurance authorization.   Follow Up Recommendations  No OT follow up    Assistance Recommended at Discharge Intermittent Supervision/Assistance  Patient can return home with the following Assistance with cooking/housework;Assist for transportation;Help with stairs or ramp for entrance    Functional Status Assessment  Patient has had a recent decline in their functional status and demonstrates the ability to make significant improvements in function in a reasonable and predictable amount of time.  Equipment Recommendations  None recommended by OT    Recommendations for Other Services       Precautions / Restrictions Precautions Precautions: Fall Restrictions Weight Bearing Restrictions: No      Mobility Bed Mobility Overal bed mobility: Modified Independent                  Transfers                           Balance Overall balance assessment: Mild deficits observed, not formally tested                                         ADL either performed or assessed with clinical judgement   ADL Overall ADL's : Needs assistance/impaired Eating/Feeding: NPO Eating/Feeding Details (indicate cue type and reason): pending proceedure this AM. patient requested coffee when proceedure was done. nurse made aware. Grooming: Dance movement psychotherapist;Wash/dry hands;Set up;Sitting Grooming Details (indicate cue type and reason): EOB Upper Body Bathing: Set up;Sitting Upper Body Bathing Details (indicate cue type and reason): EOB Lower Body Bathing: Min guard;Sitting/lateral leans   Upper Body Dressing : Set up;Sitting   Lower Body Dressing: Sitting/lateral leans;Sit to/from stand;Min guard Lower Body Dressing Details (indicate cue type and reason): patient was able to don socks seated EOB with MI after set up. Toilet Transfer: Min guard;Ambulation;BSC/3in1 Toilet Transfer Details (indicate cue type and reason): with no AD with mild unsteadiness. Toileting- Water quality scientist and Hygiene: Min guard;Sitting/lateral lean Toileting - Clothing Manipulation Details (indicate cue type and reason): with increased time             Vision Patient Visual Report: No change from baseline       Perception     Praxis      Pertinent Vitals/Pain Pain Assessment Pain Assessment: No/denies pain     Hand Dominance Right  Extremity/Trunk Assessment Upper Extremity Assessment Upper Extremity Assessment: Overall WFL for tasks assessed   Lower Extremity Assessment Lower Extremity Assessment: Defer to PT evaluation   Cervical / Trunk Assessment Cervical / Trunk Assessment: Normal   Communication Communication Communication: No difficulties   Cognition Arousal/Alertness: Awake/alert Behavior During Therapy: Flat affect Overall Cognitive Status: Within Functional Limits  for tasks assessed                                 General Comments: better mood today compared to 8/6     General Comments       Exercises     Shoulder Instructions      Home Living Family/patient expects to be discharged to:: Private residence Living Arrangements: Spouse/significant other Available Help at Discharge: Family Type of Home: House Home Access: Level entry     Home Layout: One level     Bathroom Shower/Tub: Teacher, early years/pre: Standard     Home Equipment: Kasandra Knudsen - single point          Prior Functioning/Environment Prior Level of Function : Independent/Modified Independent                        OT Problem List: Decreased activity tolerance;Decreased safety awareness;Decreased knowledge of precautions      OT Treatment/Interventions: Self-care/ADL training;Therapeutic exercise;Neuromuscular education;Energy conservation;DME and/or AE instruction;Therapeutic activities;Balance training;Patient/family education    OT Goals(Current goals can be found in the care plan section) Acute Rehab OT Goals Patient Stated Goal: to get proceedure done today OT Goal Formulation: With patient Time For Goal Achievement: 06/26/22 Potential to Achieve Goals: Fair  OT Frequency: Min 1X/week    Co-evaluation              AM-PAC OT "6 Clicks" Daily Activity     Outcome Measure Help from another person eating meals?: None Help from another person taking care of personal grooming?: None Help from another person toileting, which includes using toliet, bedpan, or urinal?: A Little Help from another person bathing (including washing, rinsing, drying)?: A Little Help from another person to put on and taking off regular upper body clothing?: A Little Help from another person to put on and taking off regular lower body clothing?: A Little 6 Click Score: 20   End of Session Nurse Communication: Mobility status  Activity  Tolerance: Patient tolerated treatment well Patient left: in bed;with call bell/phone within reach;with nursing/sitter in room  OT Visit Diagnosis: Unsteadiness on feet (R26.81)                Time: 5436-0677 OT Time Calculation (min): 14 min Charges:  OT General Charges $OT Visit: 1 Visit OT Evaluation $OT Eval Low Complexity: 1 Low  Jasmine Sauers, MS Acute Rehabilitation Department Office# [phone] Pager# [phone]   Jasmine Keith 06/12/2022, 9:00 AM

## 2022-06-12 NOTE — Progress Notes (Signed)
Initial Nutrition Assessment  DOCUMENTATION CODES:   Not applicable  INTERVENTION:  - continue Ensure Plus High Protein BID, each supplement provides 350 kcal and 20 grams of protein.  - will order Boost Breeze once/day, each supplement provides 250 kcal and 9 grams of protein.  - will order 1 tablet multivitamin with minerals/day.  - added Low Fiber Nutrition Therapy handout to AVS.  - complete NFPE when feasible.    NUTRITION DIAGNOSIS:   Increased nutrient needs related to acute illness, chronic illness as evidenced by estimated needs.  GOAL:   Patient will meet greater than or equal to 90% of their needs  MONITOR:   PO intake, Supplement acceptance, Labs, Weight trends  REASON FOR ASSESSMENT:   Malnutrition Screening Tool  ASSESSMENT:   70-year with history of HTN, DM 2, tobacco abuse, reflux disease, hypothyroidism brought to the hospital for weakness, failure to thrive and lethargy.  Patient was admitted to the hospital for similar reasons about 2 weeks ago thereafter discharged home.  Despite of taking Imodium at home she is still having diarrhea.  Upon admission CT of the head was negative, ammonia levels were normal.  Due to intermittent nausea and vomiting and given her previous history gastritis/duodenitis, GI team consulted.  Unable to see patient at time of attempted visit. Flow sheet documentation indicates patient ate 100% at all meals on 8/5; 0% of breakfast and 0% of lunch yesterday; 100% of lunch today.   Ensure ordered BID at time off admission and patient has been accepting this supplement 50% of the time offered.   She has not been seen by a [place] RD at any time in the past.  Weight today is 105 lb and weight on 8/4 was documented as 111 lb. Weight on 05/25/22 was 109 lb which indicates 4 lb weight loss (3.7% body weight) in the past 1 3 weeks.  No information documented in the edema section of flow sheet this hospitalization.     Labs  reviewed; CBGs: 122, 185, 179 mg/dl, Cl: 117 mmol/l, creatinine: 1.04 mg/dl, Ca: 8.7 mg/dl, GFR: 58 ml/min.  Medications reviewed; sliding scale novolog, 25 mcg oral synthroid/day, 2 g IV Mg sulfate x1 run 8/7, 10 mg IV reglan TID, 40 mg oral protonix BID.  IVF; NS @ 75 ml/hr.    NUTRITION - FOCUSED PHYSICAL EXAM:  Unable at this time.   Diet Order:   Diet Order             DIET SOFT Room service appropriate? Yes; Fluid consistency: Thin  Diet effective now                   EDUCATION NEEDS:   Education needs have been addressed  Skin:  Skin Assessment: Reviewed RN Assessment  Last BM:  PTA/unknown  Height:   Ht Readings from Last 1 Encounters:  06/09/22 '5\' 1"'$  (1.549 m)    Weight:   Wt Readings from Last 1 Encounters:  06/12/22 47.8 kg     BMI:  Body mass index is 19.91 kg/m.  Estimated Nutritional Needs:  Kcal:  1650-1900 kcal Protein:  85-95 grams Fluid:  >/= 1.8 L/day      Jarome Matin, MS, RD, LDN, CNSC Registered Dietitian II Inpatient Clinical Nutrition RD pager # and on-call/weekend pager # available in [facility]

## 2022-06-12 NOTE — Plan of Care (Signed)

## 2022-06-12 NOTE — Progress Notes (Signed)
Of PROGRESS NOTE    Jasmine Keith  WEX:[id number] DOB: 1951/03/29 DOA: 06/09/2022 PCP: Mackie Pai, PA-C   Brief Narrative:  11-year with history of HTN, DM 2, tobacco abuse, reflux disease, hypothyroidism brought to the hospital for weakness, failure to thrive and lethargy.  Patient was admitted to the hospital for similar reasons about 2 weeks ago thereafter discharged home.  Despite of taking Imodium at home she is still having diarrhea.  Upon admission CT of the head was negative, ammonia levels were normal.  Due to intermittent nausea and vomiting and given her previous history gastritis/duodenitis, GI team consulted.   Assessment & Plan:  Principal Problem:   AKI (acute kidney injury) ([place]) Active Problems:   Dehydration   Hypomagnesemia   DM2 (diabetes mellitus, type 2) ([facility])   GERD (gastroesophageal reflux disease)   HTN (hypertension)   Hypothyroidism   Tobacco abuse     Assessment and Plan: * AKI (acute kidney injury) ([place]) -Initial creatinine 1.48, baseline 1.0.  Creatinine improved with IV fluids.  Intermittent nausea vomiting diarrhea - Scheduled Reglan and PPI ordered.  [facility] consulted, patient may require endoscopy. EGD/colonoscopy in 2022-showed duodenitis/gastritis and colonic polyp.  Negative for H. pylori.  Symptomatic bacteriuria - Although UA improved from previous UA she reports of burning sensation with dysuria.  We will empirically treat her with 5 days of oral Keflex.  Hypomagnesemia/hypokalemia Aggressive repletion  Acute metabolic encephalopathy, resolved - Likely from dehydration.  Ammonia levels normal.  CT head negative.  Appears she has mild UTI but improved from previous UA -TSH B12 folate normal  Dehydration and failure to thrive Continue IV fluids.  Encourage oral diet.  Tobacco abuse Nicotine patch as needed  Hypothyroidism Continue synthroid 25 mcg daily.  HTN (hypertension) Current medications Norvasc 10 mg daily,  Toprol-XL 50 mg daily.  Home losartan on hold.  IV as needed ordered  GERD (gastroesophageal reflux disease) PPI  DM2 (diabetes mellitus, type 2) (HCC) Sliding scale and Accu-Cheks.  Home Levemir on hold  PT/OT   DVT prophylaxis: SQ Heparin Code Status: Full Code Family Communication: Husband at bedside  Ongoing GI evaluation.  Hopefully DC once cleared by them  Subjective: Seen and examined at bedside, tells me nausea vomiting is resolved.  No episode of diarrhea.  She is afraid that she will start having nausea vomiting again as it is very intermittent. Examination: Constitutional: Not in acute distress Respiratory: Clear to auscultation bilaterally Cardiovascular: Normal sinus rhythm, no rubs Abdomen: Nontender nondistended good bowel sounds Musculoskeletal: No edema noted Skin: No rashes seen Neurologic: CN 2-12 grossly intact.  And nonfocal Psychiatric: Normal judgment and insight. Alert and oriented x 3. Normal mood.  Objective: Vitals:   06/11/22 1413 06/11/22 1449 06/11/22 2120 06/12/22 0613  BP: (!) 190/57 (!) 190/57 (!) 147/53 (!) 158/49  Pulse: 89 89 80 69  Resp: '16  14 14  '$ Temp: 97.9 F (36.6 C)  99 F (37.2 C) 98.2 F (36.8 C)  TempSrc: Oral  Oral Oral  SpO2: 98%  96% 96%  Weight:    47.8 kg  Height:        Intake/Output Summary (Last 24 hours) at 06/12/2022 0825 Last data filed at 06/11/2022 2152 Gross per 24 hour  Intake 360 ml  Output 5 ml  Net 355 ml   Filed Weights   06/09/22 2200 06/11/22 0500 06/12/22 0613  Weight: 50.6 kg 50.5 kg 47.8 kg     Data Reviewed:   CBC: Recent Labs  Lab 06/09/22 1636 06/10/22 0600 06/12/22 0445  WBC 8.5 6.6 6.4  NEUTROABS 6.7 4.8  --   HGB 10.6* 9.6* 8.9*  HCT 32.5* 29.9* 28.0*  MCV 81.3 82.8 84.8  PLT 474* 351 469   Basic Metabolic Panel: Recent Labs  Lab 06/09/22 1636 06/10/22 0600 06/12/22 0445  NA 137 136 142  K 4.2 3.4* 3.7  CL 108 106 117*  CO2 19* 20* 20*  GLUCOSE 171* 105* 112*  BUN  37* 29* 21  CREATININE 1.48* 1.00 1.04*  CALCIUM 9.7 9.2 8.7*  MG 1.4* 2.2 1.7   GFR: Estimated Creatinine Clearance: 38.5 mL/min (A) (by C-G formula based on SCr of 1.04 mg/dL (H)). Liver Function Tests: Recent Labs  Lab 06/09/22 1636 06/10/22 0600  AST 29 20  ALT 16 14  ALKPHOS 35* 30*  BILITOT 0.4 0.5  PROT 8.0 7.0  ALBUMIN 4.3 4.0   No results for input(s): "LIPASE", "AMYLASE" in the last 168 hours. Recent Labs  Lab 06/09/22 1905  AMMONIA 21   Coagulation Profile: No results for input(s): "INR", "PROTIME" in the last 168 hours. Cardiac Enzymes: No results for input(s): "CKTOTAL", "CKMB", "CKMBINDEX", "TROPONINI" in the last 168 hours. BNP (last 3 results) No results for input(s): "PROBNP" in the last 8760 hours. HbA1C: No results for input(s): "HGBA1C" in the last 72 hours. CBG: Recent Labs  Lab 06/11/22 0546 06/11/22 0758 06/11/22 1201 06/11/22 1720 06/11/22 2117  GLUCAP 182* 154* 125* 125* 267*   Lipid Profile: No results for input(s): "CHOL", "HDL", "LDLCALC", "TRIG", "CHOLHDL", "LDLDIRECT" in the last 72 hours. Thyroid Function Tests: Recent Labs    06/10/22 0600  TSH 2.297   Anemia Panel: Recent Labs    06/10/22 0908  VITAMINB12 337  FOLATE 17.5   Sepsis Labs: No results for input(s): "PROCALCITON", "LATICACIDVEN" in the last 168 hours.  No results found for this or any previous visit (from the past 240 hour(s)).       Radiology Studies: No results found.      Scheduled Meds:  amLODipine  10 mg Oral Daily   busPIRone  7.5 mg Oral BID   cephALEXin  500 mg Oral Q8H   dicyclomine  10 mg Oral TID AC & HS   feeding supplement  237 mL Oral BID BM   fenofibrate  160 mg Oral Daily   gabapentin  400 mg Oral BID   heparin  5,000 Units Subcutaneous Q8H   hydrALAZINE  100 mg Oral BID   insulin aspart  0-5 Units Subcutaneous QHS   insulin aspart  0-9 Units Subcutaneous TID WC   levothyroxine  25 mcg Oral QAC breakfast   metoCLOPramide  (REGLAN) injection  10 mg Intravenous Q8H   metoprolol succinate  50 mg Oral Daily   pantoprazole  40 mg Oral BID AC   Ensure Max Protein  11 oz Oral Daily   venlafaxine XR  75 mg Oral Q breakfast   Continuous Infusions:  sodium chloride 75 mL/hr at 06/12/22 0351   magnesium sulfate bolus IVPB        LOS: 2 days   Time spent= 35 mins    Jarvin Ogren Arsenio Loader, MD [facility]  If 7PM-7AM, please contact night-coverage  06/12/2022, 8:25 AM

## 2022-06-12 NOTE — Progress Notes (Signed)
Physical Therapy Treatment Patient Details Name: Jasmine Keith MRN: [id number] DOB: 1951-09-02 Today's Date: 06/12/2022   History of Present Illness 70-year with history of HTN, DM 2, tobacco abuse, reflux disease, hypothyroidism brought to the hospital for weakness, failure to thrive and lethargy.    PT Comments    Pt tolerated increased ambulation distance of 180' with RW, no loss of balance. She performed seated BLE exercises for strengthening. She declined RW/rollator for home. She is progressing well with mobility, no f/u PT indicated following acute DC.    Recommendations for follow up therapy are one component of a multi-disciplinary discharge planning process, led by the attending physician.  Recommendations may be updated based on patient status, additional functional criteria and insurance authorization.  Follow Up Recommendations  No PT follow up     Assistance Recommended at Discharge PRN  Patient can return home with the following Assistance with cooking/housework   Equipment Recommendations  None recommended by PT (pt declined RW/rollator)    Recommendations for Other Services       Precautions / Restrictions Precautions Precautions: Fall Restrictions Weight Bearing Restrictions: No     Mobility  Bed Mobility Overal bed mobility: Modified Independent Bed Mobility: Supine to Sit     Supine to sit: HOB elevated     General bed mobility comments: used rail    Transfers Overall transfer level: Needs assistance Equipment used: Rolling walker (2 wheels) Transfers: Sit to/from Stand Sit to Stand: Supervision           General transfer comment: cues for hand placement    Ambulation/Gait Ambulation/Gait assistance: Supervision Gait Distance (Feet): 180 Feet Assistive device: Rolling walker (2 wheels) Gait Pattern/deviations: WFL(Within Functional Limits) Gait velocity: WFL     General Gait Details: steady, no loss of balance   Stairs              Wheelchair Mobility    Modified Rankin (Stroke Patients Only)       Balance Overall balance assessment: Modified Independent                                          Cognition Arousal/Alertness: Awake/alert Behavior During Therapy: WFL for tasks assessed/performed Overall Cognitive Status: Within Functional Limits for tasks assessed                                          Exercises General Exercises - Lower Extremity Long Arc Quad: AROM, 10 reps, Both, Seated Hip Flexion/Marching: AROM, Both, 10 reps, Seated    General Comments        Pertinent Vitals/Pain Pain Assessment Pain Assessment: No/denies pain    Home Living Family/patient expects to be discharged to:: Private residence Living Arrangements: Spouse/significant other Available Help at Discharge: Family Type of Home: House Home Access: Level entry       Home Layout: One level Home Equipment: Cane - single point      Prior Function            PT Goals (current goals can now be found in the care plan section) Acute Rehab PT Goals Patient Stated Goal: gardening, independence PT Goal Formulation: With patient Time For Goal Achievement: 06/24/22 Potential to Achieve Goals: Good Progress towards PT goals: Progressing toward goals    Frequency  Min 3X/week      PT Plan Discharge plan needs to be updated    Co-evaluation              AM-PAC PT "6 Clicks" Mobility   Outcome Measure  Help needed turning from your back to your side while in a flat bed without using bedrails?: None Help needed moving from lying on your back to sitting on the side of a flat bed without using bedrails?: None Help needed moving to and from a bed to a chair (including a wheelchair)?: None Help needed standing up from a chair using your arms (e.g., wheelchair or bedside chair)?: None Help needed to walk in hospital room?: None Help needed climbing 3-5 steps  with a railing? : A Little 6 Click Score: 23    End of Session   Activity Tolerance: Patient tolerated treatment well Patient left: in chair;with call bell/phone within reach Nurse Communication: Mobility status PT Visit Diagnosis: Muscle weakness (generalized) (M62.81);Difficulty in walking, not elsewhere classified (R26.2)     Time: 8250-0370 PT Time Calculation (min) (ACUTE ONLY): 13 min  Charges:  $Gait Training: 8-22 mins                    Blondell Reveal Kistler PT 06/12/2022  Acute Rehabilitation Services  Office [phone]

## 2022-06-12 NOTE — Discharge Instructions (Signed)
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
## 2022-06-13 ENCOUNTER — Other Ambulatory Visit: Payer: Self-pay | Admitting: Medical

## 2022-06-13 DIAGNOSIS — N179 Acute kidney failure, unspecified: Secondary | ICD-10-CM | POA: Diagnosis not present

## 2022-06-13 LAB — BASIC METABOLIC PANEL
Anion gap: 7 (ref 5–15)
BUN: 24 mg/dL — ABNORMAL HIGH (ref 8–23)
CO2: 20 mmol/L — ABNORMAL LOW (ref 22–32)
Calcium: 8.7 mg/dL — ABNORMAL LOW (ref 8.9–10.3)
Chloride: 112 mmol/L — ABNORMAL HIGH (ref 98–111)
Creatinine, Ser: 0.94 mg/dL (ref 0.44–1.00)
GFR, Estimated: >60 mL/min (ref >60)
Glucose, Bld: 131 mg/dL — ABNORMAL HIGH (ref 70–99)
Potassium: 3.9 mmol/L (ref 3.5–5.1)
Sodium: 139 mmol/L (ref 135–145)

## 2022-06-13 LAB — MAGNESIUM: Magnesium: 2.1 mg/dL (ref 1.7–2.4)

## 2022-06-13 LAB — CBC
HCT: 25.7 % — ABNORMAL LOW (ref 36.0–46.0)
Hemoglobin: 8.3 g/dL — ABNORMAL LOW (ref 12.0–15.0)
MCH: 26.9 pg (ref 26.0–34.0)
MCHC: 32.3 g/dL (ref 30.0–36.0)
MCV: 83.2 fL (ref 80.0–100.0)
Platelets: 290 10*3/uL (ref 150–400)
RBC: 3.09 MIL/uL — ABNORMAL LOW (ref 3.87–5.11)
RDW: 14.6 % (ref 11.5–15.5)
WBC: 6.6 10*3/uL (ref 4.0–10.5)
nRBC: 0 % (ref 0.0–0.2)

## 2022-06-13 LAB — GLUCOSE, CAPILLARY: Glucose-Capillary: 143 mg/dL — ABNORMAL HIGH (ref 70–99)

## 2022-06-13 MED ORDER — DICYCLOMINE HCL 10 MG PO CAPS: 10.0000 mg | ORAL_CAPSULE | Freq: Three times a day (TID) | ORAL | 0 refills | Status: DC

## 2022-06-13 MED ORDER — METOCLOPRAMIDE HCL 5 MG PO TABS: 5.0000 mg | ORAL_TABLET | Freq: Three times a day (TID) | ORAL | 0 refills | Status: DC

## 2022-06-13 MED ORDER — METOCLOPRAMIDE HCL 5 MG PO TABS: 5.0000 mg | ORAL_TABLET | Freq: Three times a day (TID) | ORAL | Status: DC

## 2022-06-13 MED ORDER — MECLIZINE HCL 25 MG PO TABS: 25.0000 mg | ORAL_TABLET | Freq: Three times a day (TID) | ORAL | 0 refills | Status: DC | PRN

## 2022-06-13 MED ORDER — PANTOPRAZOLE SODIUM 40 MG PO TBEC
40.0000 mg | DELAYED_RELEASE_TABLET | Freq: Two times a day (BID) | ORAL | 0 refills | Status: DC
Start: 2022-06-13 — End: 2022-07-17

## 2022-06-13 MED ORDER — CEPHALEXIN 500 MG PO CAPS: 500.0000 mg | ORAL_CAPSULE | Freq: Three times a day (TID) | ORAL | 0 refills | Status: AC

## 2022-06-13 MED ORDER — ONDANSETRON 4 MG PO TBDP: 4.0000 mg | ORAL_TABLET | Freq: Three times a day (TID) | ORAL | 0 refills | Status: DC | PRN

## 2022-06-13 MED ORDER — LOSARTAN POTASSIUM 50 MG PO TABS
100.0000 mg | ORAL_TABLET | Freq: Every day | ORAL | Status: DC
  Administered 2022-06-13: 100 mg via ORAL
  Filled 2022-06-13: qty 2

## 2022-06-13 NOTE — Progress Notes (Signed)
Daily Progress Note  Hospital Day: 5  Chief Complaint:   Brief History Jasmine Keith is a 71 y.o. female with a pmh not limited to HTN, DM2, tobacco abuse, GERD, C-diff, hypothyroidism, adenomatous colon polyps   Assessment / Plan   # Intermittent nausea, vomiting, diarrhea. Really started the beginning of the year after antibiotic use but acutely worsened several weeks ago leading to an admission in [place] for dehydration and C-diff. Treated with Vancomycin but has continued to have intermittent symptoms. Readmitted to [facility] late July. Stool studies were negative at that time.  Now readmitted with ongoing symptoms  Initially her symptoms may have been related to antibiotics, or maybe she had C-diff for several months. 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  Diarrhea has resolved. Dicyclomine may have slowed things down. Would continue Dicyclomine as it has also helped her abdominal pain No nausea / vomiting in over 24 hours. Tolerated solids. Would discharge her on Reglan 5 mg before meals as needed.  Will hopefully be able to stop it all together soon. Discussed potential side effects of medications ( involuntary muscle movements / facial tics). Advised to stop medication if this occurs Follow up with me has been made.    # Worsening of chronic [place] anemia, probably multifactorial. Baseline hgb mid 11. It has fluctuated since admission to [facility] late July, currently down to 8.3.  Suspect illness, IVF, blood draws all contributing to the fluctuations in hgb but will need monitoring. She had an EGD and colonoscopy just over a year ago.  Will obtain iron studies and repeat CBC when I see her in the office on 07/18/22 at 9:30 am    Subjective   No nausea / vomiting. Tolerating solids yesterday and today. No diarrhea.   Objective   Endoscopic studies:  May 2022 EGD and colonoscopy EGD  - Mild gastritis. Mild prominent folds.     Colonoscopy  - One 2 mm polyp in the proximal ascending colon, removed with a cold biopsy forceps. Resected and retrieved. - Pancolonic diverticulosis predominantly in the sigmoid colon. - Non-bleeding internal hemorrhoids. --Exam o/w normal.  Diagnosis 1. Surgical [P], duodenal biopsies - PEPTIC DUODENITIS. 2. Surgical [P], gastric antrum and gastric body - MILD CHRONIC GASTRITIS. - WARTHIN-STARRY STAIN IS NEGATIVE FOR HELICOBACTER PYLORI. 3. Surgical [P], colon, ascending, polyp (1) - TUBULAR ADENOMA. - NEGATIVE FOR HIGH GRADE DYSPLASIA. 4. Surgical [P], colon nos, random colon biopsies - COLONIC MUCOSA WITH NO SIGNIFICANT PATHOLOGIC FINDINGS. - NEGATIVE FOR ACTIVE INFLAMMATION AND OTHER ABNORMALITIES.   Imaging:  CT Head Wo Contrast  Result Date: 06/09/2022 CLINICAL DATA:  Mental status change, unknown cause EXAM: CT HEAD WITHOUT CONTRAST TECHNIQUE: Contiguous axial images were obtained from the base of the skull through the vertex without intravenous contrast. RADIATION DOSE REDUCTION: This exam was performed according to the departmental dose-optimization program which includes automated exposure control, adjustment of the mA and/or kV according to patient size and/or use of iterative reconstruction technique. COMPARISON:  CT head March 02, 2021. FINDINGS: Brain: No evidence of acute infarction, hemorrhage, hydrocephalus, extra-axial collection or mass lesion/mass effect. Partially empty sella. Vascular: No hyperdense vessel identified. Skull: No acute fracture. Sinuses/Orbits: Clear sinuses.  No acute orbital findings. Other: No mastoid effusions. IMPRESSION: No evidence of acute intracranial abnormality. Electronically Signed   By: Margaretha Sheffield M.D.   On: 06/09/2022 19:00    Lab Results: Recent Labs    06/12/22 0445 06/13/22 0506  WBC  6.4 6.6  HGB 8.9* 8.3*  HCT 28.0* 25.7*  PLT 323 290   BMET Recent Labs    06/12/22 0445 06/13/22 0506  NA 142 139  K 3.7 3.9  CL  117* 112*  CO2 20* 20*  GLUCOSE 112* 131*  BUN 21 24*  CREATININE 1.04* 0.94  CALCIUM 8.7* 8.7*   LFT No results for input(s): "PROT", "ALBUMIN", "AST", "ALT", "ALKPHOS", "BILITOT", "BILIDIR", "IBILI" in the last 72 hours. PT/INR No results for input(s): "LABPROT", "INR" in the last 72 hours.   Scheduled inpatient medications:   amLODipine  10 mg Oral Daily   busPIRone  7.5 mg Oral BID   cephALEXin  500 mg Oral Q8H   dicyclomine  10 mg Oral TID AC & HS   feeding supplement  1 Container Oral Q24H   feeding supplement  237 mL Oral BID BM   fenofibrate  160 mg Oral Daily   gabapentin  400 mg Oral BID   heparin  5,000 Units Subcutaneous Q8H   hydrALAZINE  100 mg Oral BID   insulin aspart  0-5 Units Subcutaneous QHS   insulin aspart  0-9 Units Subcutaneous TID WC   levothyroxine  25 mcg Oral QAC breakfast   losartan  100 mg Oral Daily   metoCLOPramide  5 mg Oral TID AC   metoprolol succinate  50 mg Oral Daily   multivitamin with minerals  1 tablet Oral Daily   pantoprazole  40 mg Oral BID AC   Ensure Max Protein  11 oz Oral Daily   venlafaxine XR  75 mg Oral Q breakfast   Continuous inpatient infusions:  PRN inpatient medications: acetaminophen **OR** acetaminophen, cloNIDine, guaiFENesin, hydrALAZINE, ipratropium-albuterol, meclizine, metoprolol tartrate, ondansetron **OR** ondansetron (ZOFRAN) IV, senna-docusate  Vital signs in last 24 hours: Temp:  [98 F (36.7 C)-98.3 F (36.8 C)] 98.1 F (36.7 C) (08/08 0606) Pulse Rate:  [65-74] 65 (08/08 0606) Resp:  [17-18] 17 (08/08 0606) BP: (164-175)/(43-56) 175/43 (08/08 0606) SpO2:  [94 %-98 %] 97 % (08/08 0606) Weight:  [50.9 kg] 50.9 kg (08/08 0500) Last BM Date : 06/09/22  Intake/Output Summary (Last 24 hours) at 06/13/2022 1057 Last data filed at 06/13/2022 0311 Gross per 24 hour  Intake 1192.5 ml  Output --  Net 1192.5 ml     Physical Exam:  General: Alert female in NAD Heart:  Regular rate and rhythm. No lower  extremity edema Pulmonary: Normal respiratory effort Abdomen: Soft, nondistended, nontender. Normal bowel sounds.  Neurologic: Alert and oriented Psych: Pleasant. Cooperative.    Intake/Output from previous day: 08/07 0701 - 08/08 0700 In: 1492.5 [P.O.:480; I.V.:1012.5] Out: -  Intake/Output this shift: No intake/output data recorded.    Principal Problem:   AKI (acute kidney injury) ([place]) Active Problems:   Dehydration   Hypomagnesemia   DM2 (diabetes mellitus, type 2) (HCC)   GERD (gastroesophageal reflux disease)   HTN (hypertension)   Hypothyroidism   Tobacco abuse     LOS: 3 days   Tye Savoy ,NP 06/13/2022, 10:57 AM

## 2022-06-13 NOTE — Discharge Summary (Signed)
Physician Discharge Summary  ANAIJAH AUGSBURGER GNF:[id number] DOB: [birth year] DOA: 06/09/2022  PCP: Mackie Pai, PA-C  Admit date: 06/09/2022 Discharge date: 06/13/2022  Admitted From: Home Disposition:  Home  Recommendations for Outpatient Follow-up:  Follow up with PCP in 1-2 weeks Please obtain BMP/CBC in one week your next doctors visit.  Keflex for 2 more days to complete the course Bentyl 4 times daily.  Reglan 5 mg 3 times daily premeals.  Zofran ODT as needed Meclizine as needed Outpatient follow-up with [facility]   Discharge Condition: Stable CODE STATUS: Full code Diet recommendation: 2 g salt  Brief/Interim Summary: 70-year with history of HTN, DM 2, tobacco abuse, reflux disease, hypothyroidism brought to the hospital for weakness, failure to thrive and lethargy.  Patient was admitted to the hospital for similar reasons about 2 weeks ago thereafter discharged home.  Despite of taking Imodium at home she is still having diarrhea.  Upon admission CT of the head was negative, ammonia levels were normal.  Due to intermittent nausea and vomiting and given her previous history gastritis/duodenitis, GI team consulted.  Patient's symptoms significantly improved the following day on Bentyl and Reglan.  Doing much better.  GI recommended outpatient follow-up as mentioned above.     Assessment & Plan:  Principal Problem:   AKI (acute kidney injury) ([place]) Active Problems:   Dehydration   Hypomagnesemia   DM2 (diabetes mellitus, type 2) (HCC)   GERD (gastroesophageal reflux disease)   HTN (hypertension)   Hypothyroidism   Tobacco abuse       Assessment and Plan: * AKI (acute kidney injury) ([place]) -Initial creatinine 1.48, baseline 1.0.  Stable resolved.   Intermittent nausea vomiting diarrhea, resolved - Advised to continue PPI twice daily outpatient, Reglan Premeal and Bentyl.  Outpatient follow-up with GI. EGD/colonoscopy in 2022-showed duodenitis/gastritis and  colonic polyp.  Negative for H. pylori.   Symptomatic bacteriuria - Although UA improved from previous UA she reports of burning sensation with dysuria.  2 more days of oral Keflex upon discharge   Hypomagnesemia/hypokalemia Aggressive repletion   Acute metabolic encephalopathy, resolved - Likely from dehydration.  Ammonia levels normal.  CT head negative.  Appears she has mild UTI but improved from previous UA -TSH B12 folate normal   Dehydration and failure to thrive Continue IV fluids.  Encourage oral diet.   Tobacco abuse Nicotine patch as needed   Hypothyroidism Continue synthroid 25 mcg daily.   HTN (hypertension) Current medications Norvasc 10 mg daily, Toprol-XL 50 mg daily.  Resume losartan.  Outpatient follow-up with PCP.   GERD (gastroesophageal reflux disease) PPI   DM2 (diabetes mellitus, type 2) (HCC) Sliding scale and Accu-Cheks.  Home Levemir on hold   PT/OT-no follow-up   Assessment and Plan:      Body mass index is 21.2 kg/m.       Discharge Diagnoses:  Principal Problem:   AKI (acute kidney injury) ([place]) Active Problems:   Dehydration   Hypomagnesemia   DM2 (diabetes mellitus, type 2) (HCC)   GERD (gastroesophageal reflux disease)   HTN (hypertension)   Hypothyroidism   Tobacco abuse      Consultations: [facility] GI  Subjective: Doing ok no nausea vomiting.   Discharge Exam: Vitals:   06/12/22 2134 06/13/22 0606  BP: (!) 165/56 (!) 175/43  Pulse: 74 65  Resp: 17 17  Temp: 98.3 F (36.8 C) 98.1 F (36.7 C)  SpO2: 94% 97%   Vitals:   06/12/22 1404 06/12/22 2134 06/13/22 0500 06/13/22 0606  BP: (!) 164/53 (!) 165/56  (!) 175/43  Pulse: 70 74  65  Resp: '18 17  17  '$ Temp: 98 F (36.7 C) 98.3 F (36.8 C)  98.1 F (36.7 C)  TempSrc: Oral Oral  Oral  SpO2: 98% 94%  97%  Weight:   50.9 kg   Height:        General: Pt is alert, awake, not in acute distress Cardiovascular: RRR, S1/S2 +, no rubs, no gallops Respiratory:  CTA bilaterally, no wheezing, no rhonchi Abdominal: Soft, NT, ND, bowel sounds + Extremities: no edema, no cyanosis  Discharge Instructions   Allergies as of 06/13/2022   No Known Allergies      Medication List     TAKE these medications    acetaminophen 325 MG tablet Commonly known as: TYLENOL Take 2 tablets (650 mg total) by mouth every 6 (six) hours as needed for mild pain (or Fever >/= 101).   alendronate 70 MG tablet Commonly known as: FOSAMAX TAKE 1 TABLET BY MOUTH ONCE A WEEK. TAKE WITH A FULL GLASS OF WATER ON AN EMPTY STOMACH. What changed: See the new instructions.   amLODipine 10 MG tablet Commonly known as: NORVASC Take 1 tablet (10 mg total) by mouth daily.   BD Pen Needle Nano U/F 32G X 4 MM Misc Generic drug: Insulin Pen Needle Use one needle daily to inject insulin   busPIRone 7.5 MG tablet Commonly known as: BUSPAR TAKE 1 TABLET BY MOUTH 2 TIMES DAILY. What changed: when to take this   cephALEXin 500 MG capsule Commonly known as: KEFLEX Take 1 capsule (500 mg total) by mouth every 8 (eight) hours for 2 days.   dicyclomine 10 MG capsule Commonly known as: BENTYL Take 1 capsule (10 mg total) by mouth 4 (four) times daily -  before meals and at bedtime.   fenofibrate 160 MG tablet TAKE 1 TABLET BY MOUTH EVERY DAY   gabapentin 800 MG tablet Commonly known as: NEURONTIN Take 1 tablet (800 mg total) by mouth 2 (two) times daily.   hydrALAZINE 100 MG tablet Commonly known as: APRESOLINE Take 100 mg by mouth 2 (two) times daily.   insulin detemir 100 UNIT/ML injection Commonly known as: LEVEMIR Inject 0.1 mLs (10 Units total) into the skin at bedtime. What changed: how much to take   levothyroxine 25 MCG tablet Commonly known as: SYNTHROID TAKE 1 TABLET BY MOUTH EVERY DAY BEFORE BREAKFAST What changed:  how much to take how to take this when to take this additional instructions   losartan 100 MG tablet Commonly known as: COZAAR TAKE 1  TABLET BY MOUTH EVERY DAY   Magnesium Oxide -Mg Supplement 500 MG Caps Take 1 capsule (500 mg total) by mouth 2 (two) times daily. For 7 days.   meclizine 25 MG tablet Commonly known as: ANTIVERT Take 1 tablet (25 mg total) by mouth 3 (three) times daily as needed for dizziness.   metFORMIN 1000 MG tablet Commonly known as: GLUCOPHAGE Take 1 tablet (1,000 mg total) by mouth 2 (two) times daily with a meal.   metoCLOPramide 5 MG tablet Commonly known as: REGLAN Take 1 tablet (5 mg total) by mouth 3 (three) times daily before meals for 14 days.   metoprolol succinate 50 MG 24 hr tablet Commonly known as: TOPROL-XL Take 50 mg by mouth daily.   nicotine 14 mg/24hr patch Commonly known as: NICODERM CQ - dosed in mg/24 hours Place 1 patch (14 mg total) onto the skin daily.  ondansetron 4 MG disintegrating tablet Commonly known as: ZOFRAN-ODT Take 1 tablet (4 mg total) by mouth every 8 (eight) hours as needed for nausea or vomiting.   pantoprazole 40 MG tablet Commonly known as: PROTONIX Take 1 tablet (40 mg total) by mouth 2 (two) times daily before a meal. What changed: when to take this   venlafaxine XR 75 MG 24 hr capsule Commonly known as: EFFEXOR-XR TAKE 1 CAPSULE BY MOUTH DAILY WITH BREAKFAST.        Follow-up Information     Saguier, Percell Miller, PA-C Follow up in 1 week(s).   Specialties: Internal Medicine, Family Medicine Contact information: [address] [place] [address] [phone]         Jackquline Denmark, MD Follow up in 1 week(s).   Specialties: Gastroenterology, Internal Medicine Contact information: [address] [place] [address] [phone]         Guenther, Paula M, NP Follow up on 07/18/2022.   Specialty: Gastroenterology Why: at 9:30 am. Please arrive 10 minutes early Contact information: [place] [address] [phone]                No Known Allergies  You were cared for by a  hospitalist during your hospital stay. If you have any questions about your discharge medications or the care you received while you were in the hospital after you are discharged, you can call the unit and asked to speak with the hospitalist on call if the hospitalist that took care of you is not available. Once you are discharged, your primary care physician will handle any further medical issues. Please note that no refills for any discharge medications will be authorized once you are discharged, as it is imperative that you return to your primary care physician (or establish a relationship with a primary care physician if you do not have one) for your aftercare needs so that they can reassess your need for medications and monitor your lab values.   Procedures/Studies: CT Head Wo Contrast  Result Date: 06/09/2022 CLINICAL DATA:  Mental status change, unknown cause EXAM: CT HEAD WITHOUT CONTRAST TECHNIQUE: Contiguous axial images were obtained from the base of the skull through the vertex without intravenous contrast. RADIATION DOSE REDUCTION: This exam was performed according to the departmental dose-optimization program which includes automated exposure control, adjustment of the mA and/or kV according to patient size and/or use of iterative reconstruction technique. COMPARISON:  CT head March 02, 2021. FINDINGS: Brain: No evidence of acute infarction, hemorrhage, hydrocephalus, extra-axial collection or mass lesion/mass effect. Partially empty sella. Vascular: No hyperdense vessel identified. Skull: No acute fracture. Sinuses/Orbits: Clear sinuses.  No acute orbital findings. Other: No mastoid effusions. IMPRESSION: No evidence of acute intracranial abnormality. Electronically Signed   By: Margaretha Sheffield M.D.   On: 06/09/2022 19:00     The results of significant diagnostics from this hospitalization (including imaging, microbiology, ancillary and laboratory) are listed below for reference.      Microbiology: No results found for this or any previous visit (from the past 240 hour(s)).   Labs: BNP (last 3 results) No results for input(s): "BNP" in the last 8760 hours. Basic Metabolic Panel: Recent Labs  Lab 06/09/22 1636 06/10/22 0600 06/12/22 0445 06/13/22 0506  NA 137 136 142 139  K 4.2 3.4* 3.7 3.9  CL 108 106 117* 112*  CO2 19* 20* 20* 20*  GLUCOSE 171* 105* 112* 131*  BUN 37* 29* 21 24*  CREATININE 1.48* 1.00 1.04* 0.94  CALCIUM 9.7 9.2 8.7* 8.7*  MG 1.4* 2.2 1.7 2.1   Liver Function Tests: Recent Labs  Lab 06/09/22 1636 06/10/22 0600  AST 29 20  ALT 16 14  ALKPHOS 35* 30*  BILITOT 0.4 0.5  PROT 8.0 7.0  ALBUMIN 4.3 4.0   No results for input(s): "LIPASE", "AMYLASE" in the last 168 hours. Recent Labs  Lab 06/09/22 1905  AMMONIA 21   CBC: Recent Labs  Lab 06/09/22 1636 06/10/22 0600 06/12/22 0445 06/13/22 0506  WBC 8.5 6.6 6.4 6.6  NEUTROABS 6.7 4.8  --   --   HGB 10.6* 9.6* 8.9* 8.3*  HCT 32.5* 29.9* 28.0* 25.7*  MCV 81.3 82.8 84.8 83.2  PLT 474* 351 323 290   Cardiac Enzymes: No results for input(s): "CKTOTAL", "CKMB", "CKMBINDEX", "TROPONINI" in the last 168 hours. BNP: Invalid input(s): "POCBNP" CBG: Recent Labs  Lab 06/12/22 0824 06/12/22 1201 06/12/22 1629 06/12/22 2136 06/13/22 0802  GLUCAP 122* 185* 179* 235* 143*   D-Dimer No results for input(s): "DDIMER" in the last 72 hours. Hgb A1c No results for input(s): "HGBA1C" in the last 72 hours. Lipid Profile No results for input(s): "CHOL", "HDL", "LDLCALC", "TRIG", "CHOLHDL", "LDLDIRECT" in the last 72 hours. Thyroid function studies No results for input(s): "TSH", "T4TOTAL", "T3FREE", "THYROIDAB" in the last 72 hours.  Invalid input(s): "FREET3" Anemia work up No results for input(s): "VITAMINB12", "FOLATE", "FERRITIN", "TIBC", "IRON", "RETICCTPCT" in the last 72 hours. Urinalysis    Component Value Date/Time   COLORURINE YELLOW 06/09/2022 1937    APPEARANCEUR CLOUDY (A) 06/09/2022 1937   LABSPEC 1.020 06/09/2022 1937   PHURINE 5.5 06/09/2022 1937   GLUCOSEU NEGATIVE 06/09/2022 1937   HGBUR NEGATIVE 06/09/2022 1937   BILIRUBINUR NEGATIVE 06/09/2022 1937   BILIRUBINUR 1+ 03/09/2021 1230   KETONESUR NEGATIVE 06/09/2022 1937   PROTEINUR 100 (A) 06/09/2022 1937   UROBILINOGEN 0.2 03/09/2021 1230   NITRITE NEGATIVE 06/09/2022 1937   LEUKOCYTESUR SMALL (A) 06/09/2022 1937   Sepsis Labs Recent Labs  Lab 06/09/22 1636 06/10/22 0600 06/12/22 0445 06/13/22 0506  WBC 8.5 6.6 6.4 6.6   Microbiology No results found for this or any previous visit (from the past 240 hour(s)).   Time coordinating discharge:  I have spent 35 minutes face to face with the patient and on the ward discussing the patients care, assessment, plan and disposition with other care givers. >50% of the time was devoted counseling the patient about the risks and benefits of treatment/Discharge disposition and coordinating care.   SIGNED:   Damita Lack, MD  [facility] 06/13/2022, 12:52 PM   If 7PM-7AM, please contact night-coverage

## 2022-06-13 NOTE — Progress Notes (Signed)
Provided patient with AVS discharge instructions, and thoroughly went through new and changed medications. Answered all of  patient's questions, and she had no further questions upon discharge.

## 2022-06-13 NOTE — TOC Transition Note (Signed)
Transition of Care [facility]) - CM/SW Discharge Note   Patient Details  Name: Jasmine Keith MRN: [id number] Date of Birth: Dec 02, 1950  [facility]) CM/SW Contact:  Ross Ludwig, LCSW Phone Number: 06/13/2022, 12:27 PM   Clinical Narrative:     Patient was recommended home health PT.  CSW spoke with patient, and asked if she is interested in [facility], she said she did not feel like she needed it.  CSW to sign off, patient will be discharging back home today.  Please reconsult if any other social work needs arise.   Final next level of care: Home/Self Care Barriers to Discharge: Barriers Resolved   Patient Goals and CMS Choice Patient states their goals for this hospitalization and ongoing recovery are:: To return back home   Choice offered to / list presented to : Patient  Discharge Placement                       Discharge Plan and Services                          HH Arranged: Refused HH          Social Determinants of Health (SDOH) Interventions     Readmission Risk Interventions     No data to display

## 2022-06-14 ENCOUNTER — Encounter: Payer: Self-pay | Admitting: *Deleted

## 2022-06-14 ENCOUNTER — Telehealth: Payer: Self-pay | Admitting: *Deleted

## 2022-06-14 NOTE — Patient Outreach (Signed)
  Care Coordination [facility] Note Transition Care Management Unsuccessful Follow-up Telephone Call  Date of discharge and from where:  06/13/22 [facility]  Attempts:  1st Attempt  Reason for unsuccessful TCM follow-up call:  Left voice message  Oneta Rack, RN, BSN, CCRN Alumnus RN [place] [facility] [phone]: direct office

## 2022-06-15 ENCOUNTER — Encounter: Payer: Self-pay | Admitting: *Deleted

## 2022-06-15 ENCOUNTER — Telehealth: Payer: Self-pay | Admitting: Medical

## 2022-06-15 ENCOUNTER — Telehealth: Payer: Self-pay | Admitting: *Deleted

## 2022-06-15 NOTE — Patient Outreach (Signed)
  Care Coordination [facility] Note Transition Care Management Unsuccessful Follow-up Telephone Call  Date of discharge and from where:  06/13/22 [facility]  Attempts:  2nd Attempt  Reason for unsuccessful TCM follow-up call:  Left voice message  Oneta Rack, RN, BSN, CCRN Alumnus RN [place] [facility] [phone]: direct office

## 2022-06-15 NOTE — Telephone Encounter (Signed)
Medication:   gabapentin (NEURONTIN) 800 MG tablet [[id number]]   Has the patient contacted their pharmacy? No. (If no, request that the patient contact the pharmacy for the refill.) (If yes, when and what did the pharmacy advise?)  Preferred Pharmacy (with phone number or street name):   [id number] [address], [id number] Phone:  [phone]  Fax:  [phone]  Agent: Please be advised that RX refills may take up to 3 business days. We ask that you follow-up with your pharmacy.

## 2022-06-16 ENCOUNTER — Other Ambulatory Visit: Payer: Self-pay | Admitting: *Deleted

## 2022-06-16 ENCOUNTER — Telehealth: Payer: Self-pay | Admitting: *Deleted

## 2022-06-16 ENCOUNTER — Encounter: Payer: Self-pay | Admitting: *Deleted

## 2022-06-16 MED ORDER — GABAPENTIN 800 MG PO TABS: 800.0000 mg | ORAL_TABLET | Freq: Two times a day (BID) | ORAL | 2 refills | Status: DC

## 2022-06-16 NOTE — Patient Outreach (Signed)
  Care Coordination [facility] Note Transition Care Management Unsuccessful Follow-up Telephone Call  Date of discharge and from where:  06/13/22 [facility]  Attempts:  3rd Attempt  Reason for unsuccessful TCM follow-up call:  Left voice message  will re-attempt post-hospital care coordination outreach to patient within 7 business days, given 30-day re-admission  Oneta Rack, RN, BSN, [place] [facility] [phone]: direct office

## 2022-06-16 NOTE — Telephone Encounter (Signed)
Refill sent to pharmacy.   

## 2022-06-19 ENCOUNTER — Encounter: Payer: Self-pay | Admitting: *Deleted

## 2022-06-19 ENCOUNTER — Telehealth: Payer: Self-pay | Admitting: *Deleted

## 2022-06-19 NOTE — Patient Outreach (Signed)
  Care Coordination   06/19/2022 Name: Jasmine Keith MRN: [id number] DOB: Nov 10, 1950   Care Coordination Outreach Attempts:  An unsuccessful telephone outreach was attempted today to offer the patient information about available care coordination services as a benefit of their health plan.   Follow Up Plan:  No further outreach attempts will be made at this time. We have been unable to contact the patient to offer or enroll patient in care coordination services  Call placed today for care coordination as a result of 3 previous unsuccessful TOC outreaches in light of 30-day hospital re-admission; confirmed PCP office visit scheduled for tomorrow; left HIPAA compliant voice message with my direct phone number should patient wish to contact me   Encounter Outcome:  No Answer  Care Coordination Interventions Activated:  No   Care Coordination Interventions:  No, not indicated    Oneta Rack, RN, BSN, [place] [facility] [phone]: direct office

## 2022-06-20 ENCOUNTER — Emergency Department (HOSPITAL_BASED_OUTPATIENT_CLINIC_OR_DEPARTMENT_OTHER)
Admission: EM | Admit: 2022-06-20 | Discharge: 2022-06-20 | Disposition: A | Discharge status: Home / Self Care | Payer: Medicare Other | Attending: Emergency Medicine | Admitting: Emergency Medicine

## 2022-06-20 ENCOUNTER — Encounter (HOSPITAL_BASED_OUTPATIENT_CLINIC_OR_DEPARTMENT_OTHER): Payer: Self-pay | Admitting: Urology

## 2022-06-20 ENCOUNTER — Ambulatory Visit (INDEPENDENT_AMBULATORY_CARE_PROVIDER_SITE_OTHER): Payer: Medicare Other | Admitting: Medical

## 2022-06-20 ENCOUNTER — Telehealth: Payer: Self-pay

## 2022-06-20 DIAGNOSIS — Z794 Long term (current) use of insulin: Secondary | ICD-10-CM | POA: Insufficient documentation

## 2022-06-20 DIAGNOSIS — R778 Other specified abnormalities of plasma proteins: Secondary | ICD-10-CM

## 2022-06-20 DIAGNOSIS — E1100 Type 2 diabetes mellitus with hyperosmolarity without nonketotic hyperglycemic-hyperosmolar coma (NKHHC): Secondary | ICD-10-CM

## 2022-06-20 DIAGNOSIS — D649 Anemia, unspecified: Secondary | ICD-10-CM | POA: Diagnosis not present

## 2022-06-20 DIAGNOSIS — E876 Hypokalemia: Secondary | ICD-10-CM

## 2022-06-20 DIAGNOSIS — Z79899 Other long term (current) drug therapy: Secondary | ICD-10-CM | POA: Diagnosis not present

## 2022-06-20 DIAGNOSIS — N179 Acute kidney failure, unspecified: Secondary | ICD-10-CM

## 2022-06-20 DIAGNOSIS — Z955 Presence of coronary angioplasty implant and graft: Secondary | ICD-10-CM

## 2022-06-20 DIAGNOSIS — F419 Anxiety disorder, unspecified: Secondary | ICD-10-CM

## 2022-06-20 DIAGNOSIS — Z7984 Long term (current) use of oral hypoglycemic drugs: Secondary | ICD-10-CM | POA: Diagnosis not present

## 2022-06-20 DIAGNOSIS — F17200 Nicotine dependence, unspecified, uncomplicated: Secondary | ICD-10-CM

## 2022-06-20 LAB — CBC WITH DIFFERENTIAL/PLATELET
Basophils Absolute: 0.1 10*3/uL (ref 0.0–0.1)
Basophils Relative: 1.2 % (ref 0.0–3.0)
Eosinophils Absolute: 0.1 10*3/uL (ref 0.0–0.7)
Eosinophils Relative: 1 % (ref 0.0–5.0)
HCT: 28.9 % — ABNORMAL LOW (ref 36.0–46.0)
Hemoglobin: 9.4 g/dL — ABNORMAL LOW (ref 12.0–15.0)
Lymphocytes Relative: 18.4 % (ref 12.0–46.0)
Lymphs Abs: 1.6 10*3/uL (ref 0.7–4.0)
MCHC: 32.6 g/dL (ref 30.0–36.0)
MCV: 82.8 fl (ref 78.0–100.0)
Monocytes Absolute: 0.5 10*3/uL (ref 0.1–1.0)
Monocytes Relative: 5.3 % (ref 3.0–12.0)
Neutro Abs: 6.4 10*3/uL (ref 1.4–7.7)
Neutrophils Relative %: 74.1 % (ref 43.0–77.0)
Platelets: 405 10*3/uL — ABNORMAL HIGH (ref 150.0–400.0)
RBC: 3.49 Mil/uL — ABNORMAL LOW (ref 3.87–5.11)
RDW: 15.6 % — ABNORMAL HIGH (ref 11.5–15.5)
WBC: 8.6 10*3/uL (ref 4.0–10.5)

## 2022-06-20 LAB — BASIC METABOLIC PANEL
Anion gap: 8 (ref 5–15)
BUN: 30 mg/dL — ABNORMAL HIGH (ref 8–23)
CO2: 21 mmol/L — ABNORMAL LOW (ref 22–32)
Calcium: 9.3 mg/dL (ref 8.9–10.3)
Chloride: 107 mmol/L (ref 98–111)
Creatinine, Ser: 1.31 mg/dL — ABNORMAL HIGH (ref 0.44–1.00)
GFR, Estimated: 44 mL/min — ABNORMAL LOW (ref >60)
Glucose, Bld: 119 mg/dL — ABNORMAL HIGH (ref 70–99)
Potassium: 3.7 mmol/L (ref 3.5–5.1)
Sodium: 136 mmol/L (ref 135–145)

## 2022-06-20 LAB — COMPREHENSIVE METABOLIC PANEL
ALT: 15 U/L (ref 0–35)
AST: 20 U/L (ref 0–37)
Albumin: 4.4 g/dL (ref 3.5–5.2)
Alkaline Phosphatase: 36 U/L — ABNORMAL LOW (ref 39–117)
BUN: 29 mg/dL — ABNORMAL HIGH (ref 6–23)
CO2: 21 mEq/L (ref 19–32)
Calcium: 9.6 mg/dL (ref 8.4–10.5)
Chloride: 103 mEq/L (ref 96–112)
Creatinine, Ser: 1.24 mg/dL — ABNORMAL HIGH (ref 0.40–1.20)
GFR: 44.36 mL/min — ABNORMAL LOW (ref >60.00)
Glucose, Bld: 177 mg/dL — ABNORMAL HIGH (ref 70–99)
Potassium: 4.2 mEq/L (ref 3.5–5.1)
Sodium: 138 mEq/L (ref 135–145)
Total Bilirubin: 0.3 mg/dL (ref 0.2–1.2)
Total Protein: 6.8 g/dL (ref 6.0–8.3)

## 2022-06-20 LAB — MAGNESIUM
Magnesium: 0.6 mg/dL — CL (ref 1.5–2.5)
Magnesium: 0.8 mg/dL — CL (ref 1.7–2.4)

## 2022-06-20 MED ORDER — BUSPIRONE HCL 15 MG PO TABS: 15.0000 mg | ORAL_TABLET | Freq: Two times a day (BID) | ORAL | 0 refills | Status: DC

## 2022-06-20 MED ORDER — SODIUM CHLORIDE 0.9 % IV BOLUS
1000.0000 mL | Freq: Once | INTRAVENOUS | Status: AC
  Administered 2022-06-20: 1000 mL via INTRAVENOUS

## 2022-06-20 MED ORDER — MAGNESIUM SULFATE 2 GM/50ML IV SOLN
2.0000 g | Freq: Once | INTRAVENOUS | Status: AC
  Administered 2022-06-20: 2 g via INTRAVENOUS
  Filled 2022-06-20: qty 50

## 2022-06-20 NOTE — Patient Instructions (Addendum)
  AKI (acute kidney injury) ([place]) -repeat cmp today to see if stable or hopefully improved.   Intermittent nausea vomiting diarrhea, resolved - Advised to continue PPI twice daily outpatient, Reglan Premeal and Bentyl.  Outpatient follow-up with GI.   Hypomagnesemia/hypokalemia After hour saw magnesium 0.6. Advised/called to be see in ED to evaluate and likely get get mag thru iv to safe level. Pt agreed would go.    Acute metabolic encephalopathy, resolved -on exam today alert and oriented.   Dehydration and failure to thrive when hospitalized. Repeating labs to evaluate.   Tobacco abuse Nicotine patch as needed   Hypothyroidism Continue synthroid 25 mcg daily.   HTN (hypertension) Current medications Norvasc 10 mg daily, Toprol-XL 50 mg daily.  Resume losartan.  Outpatient follow-up with PCP.   GERD (gastroesophageal reflux disease) PPI   DM2 (diabetes mellitus, type 2) (HCC) Presently an use metformin just 1000 mg tab daily. Will see if this helps decease gi symptoms.   For anxiety continue effexor and increaseing buspar to 15 mg bid.   After reviewing records from [place] will put in cardiology referral. At time of prior visit did not have those records. Refer for elevated troponin in hospital [place] earlier this summer  with hx of stents placed 30 years ago.

## 2022-06-20 NOTE — Progress Notes (Signed)
Subjective:    Patient ID: Jasmine Keith, female    DOB: [birth year], 71 y.o.   MRN: [id number]  HPI Pt in for follow up from the ED.  Last summary was.  Admit date: 06/09/2022 Discharge date: 06/13/2022   Admitted From: Home Disposition:  Home   Recommendations for Outpatient Follow-up:  Follow up with PCP in 1-2 weeks Please obtain BMP/CBC in one week your next doctors visit.  Keflex for 2 more days to complete the course Bentyl 4 times daily.  Reglan 5 mg 3 times daily premeals.  Zofran ODT as needed Meclizine as needed Outpatient follow-up with [facility].   Brief/Interim Summary: 71-year with history of HTN, DM 2, tobacco abuse, reflux disease, hypothyroidism brought to the hospital for weakness, failure to thrive and lethargy.  Patient was admitted to the hospital for similar reasons about 2 weeks ago thereafter discharged home.  Despite of taking Imodium at home she is still having diarrhea.  Upon admission CT of the head was negative, ammonia levels were normal.  Due to intermittent nausea and vomiting and given her previous history gastritis/duodenitis, GI team consulted.  Patient's symptoms significantly improved the following day on Bentyl and Reglan.  Doing much better.  GI recommended outpatient follow-up as mentioned above.   Assessment & Plan:  Principal Problem:   AKI (acute kidney injury) ([place]) Active Problems:   Dehydration   Hypomagnesemia   DM2 (diabetes mellitus, type 2) (HCC)   GERD (gastroesophageal reflux disease)   HTN (hypertension)   Hypothyroidism   Tobacco abuse       Assessment and Plan: * AKI (acute kidney injury) ([place]) -Initial creatinine 1.48, baseline 1.0.  Stable resolved.   Intermittent nausea vomiting diarrhea, resolved - Advised to continue PPI twice daily outpatient, Reglan Premeal and Bentyl.  Outpatient follow-up with GI. EGD/colonoscopy in 2022-showed duodenitis/gastritis and colonic polyp.  Negative for H.  pylori.   Symptomatic bacteriuria - Although UA improved from previous UA she reports of burning sensation with dysuria.  2 more days of oral Keflex upon discharge   Hypomagnesemia/hypokalemia Aggressive repletion   Acute metabolic encephalopathy, resolved - Likely from dehydration.  Ammonia levels normal.  CT head negative.  Appears she has mild UTI but improved from previous UA -TSH B12 folate normal   Dehydration and failure to thrive Continue IV fluids.  Encourage oral diet.   Tobacco abuse Nicotine patch as needed   Hypothyroidism Continue synthroid 25 mcg daily.   HTN (hypertension) Current medications Norvasc 10 mg daily, Toprol-XL 50 mg daily.  Resume losartan.  Outpatient follow-up with PCP.   GERD (gastroesophageal reflux disease) PPI   DM2 (diabetes mellitus, type 2) (HCC) Sliding scale and Accu-Cheks.  Home Levemir on hold   PT/OT-no follow-up      Overall feeling better. No nausea, no vomiting or dizziness. Occasional slight of balance.  Less loose stools.  Pt wants to consider stopping metofrmin. Last a1c was 6.8 recently.           Discharge Diagnoses:  Principal Problem:   AKI (acute kidney injury) ([place]) Active Problems:   Dehydration   Hypomagnesemia   DM2 (diabetes mellitus, type 2) (HCC)   GERD (gastroesophageal reflux disease)   HTN (hypertension)   Hypothyroidism   Tobacco abuse   Assessment and Plan:                  Discharge Diagnoses:  Principal Problem:   AKI (acute kidney injury) ([place]) Active Problems:   Dehydration  Hypomagnesemia   DM2 (diabetes mellitus, type 2) (HCC)   GERD (gastroesophageal reflux disease)   HTN (hypertension)   Hypothyroidism   Tobacco abuse     Review of Systems  Constitutional:  Negative for fatigue and fever.  HENT:  Negative for congestion.   Respiratory:  Negative for cough, chest tightness and wheezing.   Cardiovascular:  Negative for chest pain and palpitations.   Gastrointestinal:  Negative for abdominal distention, anal bleeding, blood in stool, constipation, diarrhea and nausea.  Genitourinary:  Negative for dysuria, enuresis and urgency.  Musculoskeletal:  Negative for back pain.  Skin:  Negative for rash.  Neurological:  Negative for dizziness, seizures, syncope, light-headedness and headaches.       Occasional mild light headed.  Hematological:  Negative for adenopathy. Does not bruise/bleed easily.  Psychiatric/Behavioral:  Negative for behavioral problems and confusion.    Past Medical History:  Diagnosis Date   Anxiety    Diabetes mellitus without complication (HCC)    Fibromyalgia    GERD (gastroesophageal reflux disease)    Hyperlipidemia    Hypertension    Osteoporosis    Thyroid disease      Social History   Socioeconomic History   Marital status: Married    Spouse name: Not on file   Number of children: Not on file   Years of education: Not on file   Highest education level: Not on file  Occupational History   Not on file  Tobacco Use   Smoking status: Every Day    Packs/day: 0.50    Types: Cigarettes   Smokeless tobacco: Never  Vaping Use   Vaping Use: Never used  Substance and Sexual Activity   Alcohol use: Yes    Comment: Rare   Drug use: Not Currently   Sexual activity: Not on file  Other Topics Concern   Not on file  Social History Narrative   Not on file   Social Determinants of Health   Financial Resource Strain: Low Risk  (01/19/2022)   Overall Financial Resource Strain (CARDIA)    Difficulty of Paying Living Expenses: Not hard at all  Food Insecurity: No Food Insecurity (05/31/2022)   Hunger Vital Sign    Worried About Running Out of Food in the Last Year: Never true    Ran Out of Food in the Last Year: Never true  Transportation Needs: No Transportation Needs (05/31/2022)   PRAPARE - Hydrologist (Medical): No    Lack of Transportation (Non-Medical): No  Physical  Activity: Insufficiently Active (01/19/2022)   Exercise Vital Sign    Days of Exercise per Week: 7 days    Minutes of Exercise per Session: 20 min  Stress: No Stress Concern Present (01/19/2022)   [place]    Feeling of Stress : Not at all  Social Connections: Socially Isolated (01/19/2022)   Social Connection and Isolation Panel [NHANES]    Frequency of Communication with Friends and Family: Twice a week    Frequency of Social Gatherings with Friends and Family: Never    Attends Religious Services: Never    Marine scientist or Organizations: No    Attends Archivist Meetings: Never    Marital Status: Married  Human resources officer Violence: Not At Risk (01/19/2022)   Humiliation, Afraid, Rape, and Kick questionnaire    Fear of Current or Ex-Partner: No    Emotionally Abused: No    Physically Abused: No  Sexually Abused: No    Past Surgical History:  Procedure Laterality Date   coronary stent placed  1993    Family History  Problem Relation Age of Onset   Diabetes Mother    Kidney disease Mother    Diabetes Father    Cancer Sister        lung   Kidney disease Sister    Thyroid disease Neg Hx    Colon cancer Neg Hx    Esophageal cancer Neg Hx    Rectal cancer Neg Hx    Stomach cancer Neg Hx     No Known Allergies  Current Outpatient Medications on File Prior to Visit  Medication Sig Dispense Refill   acetaminophen (TYLENOL) 325 MG tablet Take 2 tablets (650 mg total) by mouth every 6 (six) hours as needed for mild pain (or Fever >/= 101). 60 tablet 0   alendronate (FOSAMAX) 70 MG tablet TAKE 1 TABLET BY MOUTH ONCE A WEEK. TAKE WITH A FULL GLASS OF WATER ON AN EMPTY STOMACH. 12 tablet 3   amLODipine (NORVASC) 10 MG tablet Take 1 tablet (10 mg total) by mouth daily. 90 tablet 0   dicyclomine (BENTYL) 10 MG capsule Take 1 capsule (10 mg total) by mouth 4 (four) times daily -  before meals and at  bedtime. 120 capsule 0   fenofibrate 160 MG tablet TAKE 1 TABLET BY MOUTH EVERY DAY (Patient taking differently: Take 160 mg by mouth daily.) 90 tablet 0   gabapentin (NEURONTIN) 800 MG tablet Take 1 tablet (800 mg total) by mouth 2 (two) times daily. 60 tablet 2   hydrALAZINE (APRESOLINE) 100 MG tablet Take 100 mg by mouth 2 (two) times daily.     insulin detemir (LEVEMIR) 100 UNIT/ML injection Inject 0.1 mLs (10 Units total) into the skin at bedtime. (Patient taking differently: Inject 20 Units into the skin at bedtime.) 10 mL 11   Insulin Pen Needle (BD PEN NEEDLE NANO U/F) 32G X 4 MM MISC Use one needle daily to inject insulin 30 each 5   levothyroxine (SYNTHROID) 25 MCG tablet TAKE 1 TABLET BY MOUTH EVERY DAY BEFORE BREAKFAST (Patient taking differently: Take 25 mcg by mouth daily before breakfast.) 90 tablet 0   losartan (COZAAR) 100 MG tablet TAKE 1 TABLET BY MOUTH EVERY DAY (Patient taking differently: Take 100 mg by mouth daily.) 90 tablet 3   Magnesium Oxide -Mg Supplement 500 MG CAPS Take 1 capsule (500 mg total) by mouth 2 (two) times daily. For 7 days. 13 capsule 0   meclizine (ANTIVERT) 25 MG tablet Take 1 tablet (25 mg total) by mouth 3 (three) times daily as needed for dizziness. 30 tablet 0   metFORMIN (GLUCOPHAGE) 1000 MG tablet Take 1 tablet (1,000 mg total) by mouth 2 (two) times daily with a meal. 180 tablet 3   metoCLOPramide (REGLAN) 5 MG tablet Take 1 tablet (5 mg total) by mouth 3 (three) times daily before meals for 14 days. 42 tablet 0   metoprolol succinate (TOPROL-XL) 50 MG 24 hr tablet Take 50 mg by mouth daily.     nicotine (NICODERM CQ - DOSED IN MG/24 HOURS) 14 mg/24hr patch Place 1 patch (14 mg total) onto the skin daily. 28 patch 0   ondansetron (ZOFRAN-ODT) 4 MG disintegrating tablet Take 1 tablet (4 mg total) by mouth every 8 (eight) hours as needed for nausea or vomiting. 30 tablet 0   pantoprazole (PROTONIX) 40 MG tablet Take 1 tablet (40 mg total) by  mouth 2  (two) times daily before a meal. 60 tablet 0   venlafaxine XR (EFFEXOR-XR) 75 MG 24 hr capsule TAKE 1 CAPSULE BY MOUTH DAILY WITH BREAKFAST. (Patient taking differently: Take 75 mg by mouth daily with breakfast.) 90 capsule 2   No current facility-administered medications on file prior to visit.    BP (!) 160/70   Pulse 93   Resp 18   Ht '5\' 1"'$  (1.549 m)   Wt 108 lb 12.8 oz (49.4 kg)   SpO2 97%   BMI 20.56 kg/m        Objective:   Physical Exam  General Mental Status- Alert. General Appearance- Not in acute distress.   Skin General: Color- Normal Color. Moisture- Normal Moisture.  Neck Carotid Arteries- Normal color. Moisture- Normal Moisture. No carotid bruits. No JVD.  Chest and Lung Exam Auscultation: Breath Sounds:-Normal.  Cardiovascular Auscultation:Rythm- Regular. Murmurs & Other Heart Sounds:Auscultation of the heart reveals- No Murmurs.  Abdomen Inspection:-Inspeection Normal. Palpation/Percussion:Note:No mass. Palpation and Percussion of the abdomen reveal- Non Tender, Non Distended + BS, no rebound or guarding.    Neurologic Cranial Nerve exam:- CN III-XII intact(No nystagmus), symmetric smile. Strength:- 5/5 equal and symmetric strength both upper and lower extremities.       Assessment & Plan:     Patient Instructions   AKI (acute kidney injury) ([place]) -repeat cmp today to see if stable or hopefully improved.   Intermittent nausea vomiting diarrhea, resolved - Advised to continue PPI twice daily outpatient, Reglan Premeal and Bentyl.  Outpatient follow-up with GI.   Hypomagnesemia/hypokalemia After hour saw magnesium 0.6. Advised/called to be see in ED to evaluate and likely get get mag thru iv to safe level. Pt agreed would go.    Acute metabolic encephalopathy, resolved -on exam today alert and oriented.   Dehydration and failure to thrive when hospitalized. Repeating labs to evaluate.   Tobacco abuse Nicotine patch as needed    Hypothyroidism Continue synthroid 25 mcg daily.   HTN (hypertension) Current medications Norvasc 10 mg daily, Toprol-XL 50 mg daily.  Resume losartan.  Outpatient follow-up with PCP.   GERD (gastroesophageal reflux disease) PPI   DM2 (diabetes mellitus, type 2) (HCC) Presently an use metformin just 1000 mg tab daily. Will see if this helps decease gi symptoms.   For anxiety continue effexor and increaseing buspar to 15 mg bid.   After reviewing records from [place] will put in cardiology referral. At time of prior visit did not have those records. Refer for elevated troponin in hospital [place] earlyier this summer  with hx of stents placed 30 years ago.             Time spent with patient today was 45  minutes which consisted of chart review, discussing diagnosis, work up, treatment, talking to pt after ours on critical mag low(advised ED evauation), revieing records from [place] in order to refer to cardiology  and documentation.

## 2022-06-20 NOTE — ED Provider Notes (Signed)
[facility] EMERGENCY DEPARTMENT Provider Note   CSN: [id number] Arrival date & time: 06/20/22  1853     History {Add pertinent medical, surgical, social history, OB history to HPI:1} Chief Complaint  Patient presents with   low Magnesium    Jasmine Keith is a 71 y.o. female.  She was recently diagnosed with C. difficile and was admitted to the hospital getting hydration and electrolyte repletion.  She saw her primary care doctor today who repeated some labs and found her magnesium critically low at 0.6.  She was supposed to be orally taking magnesium but she says it upsets her stomach so she does not take.  She said her diarrhea is better and she is not having any abdominal pain, has been eating and drinking well.  Denies any current complaints.  The history is provided by the patient and the spouse.       Home Medications Prior to Admission medications   Medication Sig Start Date End Date Taking? Authorizing Provider  acetaminophen (TYLENOL) 325 MG tablet Take 2 tablets (650 mg total) by mouth every 6 (six) hours as needed for mild pain (or Fever >/= 101). 05/29/22   Swayze, Ava, DO  alendronate (FOSAMAX) 70 MG tablet TAKE 1 TABLET BY MOUTH ONCE A WEEK. TAKE WITH A FULL GLASS OF WATER ON AN EMPTY STOMACH. 06/13/22   Saguier, Percell Miller, PA-C  amLODipine (NORVASC) 10 MG tablet Take 1 tablet (10 mg total) by mouth daily. 04/24/22   Saguier, Percell Miller, PA-C  busPIRone (BUSPAR) 15 MG tablet Take 1 tablet (15 mg total) by mouth 2 (two) times daily. 06/20/22   Saguier, Percell Miller, PA-C  dicyclomine (BENTYL) 10 MG capsule Take 1 capsule (10 mg total) by mouth 4 (four) times daily -  before meals and at bedtime. 06/13/22 07/13/22  Amin, Jeanella Flattery, MD  fenofibrate 160 MG tablet TAKE 1 TABLET BY MOUTH EVERY DAY Patient taking differently: Take 160 mg by mouth daily. 05/08/22 08/06/22  Saguier, Percell Miller, PA-C  gabapentin (NEURONTIN) 800 MG tablet Take 1 tablet (800 mg total) by mouth 2 (two) times  daily. 06/16/22   Saguier, Percell Miller, PA-C  hydrALAZINE (APRESOLINE) 100 MG tablet Take 100 mg by mouth 2 (two) times daily. 05/17/22   [provider]  insulin detemir (LEVEMIR) 100 UNIT/ML injection Inject 0.1 mLs (10 Units total) into the skin at bedtime. Patient taking differently: Inject 20 Units into the skin at bedtime. 05/29/22   Swayze, Ava, DO  Insulin Pen Needle (BD PEN NEEDLE NANO U/F) 32G X 4 MM MISC Use one needle daily to inject insulin 12/23/21   Saguier, Percell Miller, PA-C  levothyroxine (SYNTHROID) 25 MCG tablet TAKE 1 TABLET BY MOUTH EVERY DAY BEFORE BREAKFAST Patient taking differently: Take 25 mcg by mouth daily before breakfast. 04/24/22   Saguier, Percell Miller, PA-C  losartan (COZAAR) 100 MG tablet TAKE 1 TABLET BY MOUTH EVERY DAY Patient taking differently: Take 100 mg by mouth daily. 12/07/21   Saguier, Percell Miller, PA-C  Magnesium Oxide -Mg Supplement 500 MG CAPS Take 1 capsule (500 mg total) by mouth 2 (two) times daily. For 7 days. 05/29/22   Mosie Lukes, MD  meclizine (ANTIVERT) 25 MG tablet Take 1 tablet (25 mg total) by mouth 3 (three) times daily as needed for dizziness. 06/13/22   Damita Lack, MD  metFORMIN (GLUCOPHAGE) 1000 MG tablet Take 1 tablet (1,000 mg total) by mouth 2 (two) times daily with a meal. 12/23/21   Saguier, Percell Miller, PA-C  metoCLOPramide (REGLAN) 5  MG tablet Take 1 tablet (5 mg total) by mouth 3 (three) times daily before meals for 14 days. 06/13/22 06/27/22  Damita Lack, MD  metoprolol succinate (TOPROL-XL) 50 MG 24 hr tablet Take 50 mg by mouth daily. 06/03/22   [provider]  nicotine (NICODERM CQ - DOSED IN MG/24 HOURS) 14 mg/24hr patch Place 1 patch (14 mg total) onto the skin daily. 05/30/22   Swayze, Ava, DO  ondansetron (ZOFRAN-ODT) 4 MG disintegrating tablet Take 1 tablet (4 mg total) by mouth every 8 (eight) hours as needed for nausea or vomiting. 06/13/22   Amin, Jeanella Flattery, MD  pantoprazole (PROTONIX) 40 MG tablet Take 1 tablet (40 mg  total) by mouth 2 (two) times daily before a meal. 06/13/22 07/13/22  Amin, Jeanella Flattery, MD  venlafaxine XR (EFFEXOR-XR) 75 MG 24 hr capsule TAKE 1 CAPSULE BY MOUTH DAILY WITH BREAKFAST. Patient taking differently: Take 75 mg by mouth daily with breakfast. 01/03/22   Saguier, Percell Miller, PA-C      Allergies    Patient has no known allergies.    Review of Systems   Review of Systems  Constitutional:  Negative for fever.  HENT:  Negative for sore throat.   Eyes:  Negative for visual disturbance.  Respiratory:  Negative for shortness of breath.   Cardiovascular:  Negative for chest pain.  Gastrointestinal:  Negative for abdominal pain.  Genitourinary:  Negative for dysuria.  Skin:  Negative for rash.  Neurological:  Negative for headaches.    Physical Exam Updated Vital Signs BP (!) 186/70 (BP Location: Left Arm)   Pulse 90   Temp 98.5 F (36.9 C) (Oral)   Resp 20   Ht '5\' 1"'$  (1.549 m)   Wt 49 kg   SpO2 95%   BMI 20.41 kg/m  Physical Exam Vitals and nursing note reviewed.  Constitutional:      General: She is not in acute distress.    Appearance: Normal appearance. She is well-developed.  HENT:     Head: Normocephalic and atraumatic.  Eyes:     Conjunctiva/sclera: Conjunctivae normal.  Cardiovascular:     Rate and Rhythm: Normal rate and regular rhythm.     Heart sounds: No murmur heard. Pulmonary:     Effort: Pulmonary effort is normal. No respiratory distress.     Breath sounds: Normal breath sounds.  Abdominal:     Palpations: Abdomen is soft.     Tenderness: There is no abdominal tenderness. There is no guarding or rebound.  Musculoskeletal:        General: Normal range of motion.     Cervical back: Neck supple.     Right lower leg: No edema.     Left lower leg: No edema.  Skin:    General: Skin is warm and dry.     Capillary Refill: Capillary refill takes less than 2 seconds.  Neurological:     General: No focal deficit present.     Mental Status: She is alert.      Gait: Gait normal.     ED Results / Procedures / Treatments   Labs (all labs ordered are listed, but only abnormal results are displayed) Labs Reviewed  BASIC METABOLIC PANEL  MAGNESIUM    EKG None  Radiology No results found.  Procedures Procedures  {Document cardiac monitor, telemetry assessment procedure when appropriate:1}  Medications Ordered in ED Medications  magnesium sulfate IVPB 2 g 50 mL (has no administration in time range)    ED  Course/ Medical Decision Making/ A&P                           Medical Decision Making Amount and/or Complexity of Data Reviewed Labs: ordered.  Risk Prescription drug management.   ***  {Document critical care time when appropriate:1} {Document review of labs and clinical decision tools ie heart score, Chads2Vasc2 etc:1}  {Document your independent review of radiology images, and any outside records:1} {Document your discussion with family members, caretakers, and with consultants:1} {Document social determinants of health affecting pt's care:1} {Document your decision making why or why not admission, treatments were needed:1} Final Clinical Impression(s) / ED Diagnoses Final diagnoses:  None    Rx / DC Orders ED Discharge Orders     None

## 2022-06-20 NOTE — Telephone Encounter (Signed)
CRITICAL VALUE STICKER  CRITICAL VALUE: Magnesium 0.6  RECEIVER (on-site recipient of call): Taija Mathias, RMA  DATE & TIME NOTIFIED: 06/20/2022 @ 5:00 pm  MESSENGER (representative from lab): Esperanza Richters Leader   MD NOTIFIED: Mackie Pai, PA  TIME OF NOTIFICATION:5:03 pm  RESPONSE:

## 2022-06-20 NOTE — Discharge Instructions (Signed)
You were sent in by your primary care doctor for low magnesium level.  You were given IV magnesium and you will need to continue your oral magnesium at home.  Please contact your primary care doctor for follow-up to recheck your levels.  Return to the emergency department if any worsening or concerning symptoms.

## 2022-06-20 NOTE — ED Triage Notes (Signed)
Pcp called pt and sent her here for low Mag level of 0.6 and slightly dehydrated  Has not been taking her magnesium because it upsets her stomach

## 2022-06-21 ENCOUNTER — Ambulatory Visit (INDEPENDENT_AMBULATORY_CARE_PROVIDER_SITE_OTHER): Payer: Medicare Other | Admitting: Family Medicine

## 2022-06-21 ENCOUNTER — Telehealth: Payer: Self-pay | Admitting: Medical

## 2022-06-21 ENCOUNTER — Ambulatory Visit: Payer: Self-pay | Admitting: *Deleted

## 2022-06-21 ENCOUNTER — Encounter: Payer: Self-pay | Admitting: Family Medicine

## 2022-06-21 ENCOUNTER — Encounter: Payer: Self-pay | Admitting: *Deleted

## 2022-06-21 LAB — BASIC METABOLIC PANEL
BUN: 23 mg/dL (ref 6–23)
CO2: 21 mEq/L (ref 19–32)
Calcium: 9.5 mg/dL (ref 8.4–10.5)
Chloride: 105 mEq/L (ref 96–112)
Creatinine, Ser: 1.24 mg/dL — ABNORMAL HIGH (ref 0.40–1.20)
GFR: 44.36 mL/min — ABNORMAL LOW (ref >60.00)
Glucose, Bld: 132 mg/dL — ABNORMAL HIGH (ref 70–99)
Potassium: 4.1 mEq/L (ref 3.5–5.1)
Sodium: 138 mEq/L (ref 135–145)

## 2022-06-21 LAB — MAGNESIUM: Magnesium: 1.3 mg/dL — ABNORMAL LOW (ref 1.5–2.5)

## 2022-06-21 NOTE — Patient Outreach (Signed)
  Care Coordination   Case Discussion  Visit Note   06/21/2022 Name: Jasmine Keith MRN: [id number] DOB: [birth year]  Jasmine Keith is a 71 y.o. year old female who sees Saguier, Percell Miller, [place] for primary care. I  Multidisciplinary case discussion  What matters to the patients health and wellness today?  Collaborated with multidisciplinary team to review patient care needs and barriers   SDOH assessments and interventions completed:  No   Care Coordination Interventions Activated:  No  Care Coordination Interventions:  No, not indicated   Follow up plan: No further intervention required. Case discussion complete: patient did not engage with RN Care Coordinator at time of [facility] outreach attempts after most recent hospitalization; re-attempted outreach for care coordination needs- also unsuccessful; confirmed through review of EHR that patient scheduled and attended most recent post-hospital discharge PCP office visit  Encounter Outcome:  Pt. Visit Completed   Oneta Rack, RN, BSN, [facility] [phone]: direct office

## 2022-06-21 NOTE — Progress Notes (Signed)
Call her cell phone for results: [phone]  Labs are improving! Magnesium is still slightly low, but significantly better than yesterday. Continue the supplementation you and Percell Miller discussed and follow-up with him next week to ensure magnesium stabilizes.

## 2022-06-21 NOTE — Patient Instructions (Signed)
Updating labs today. We will let you know results.  Plan on supplementation as discussed with Jasmine Keith yesterday and follow-up in 7-10 days to ensure adequate response.   Please contact office for follow-up if symptoms do not improve or worsen. Seek emergency care if symptoms become severe.

## 2022-06-21 NOTE — Progress Notes (Signed)
   Acute Office Visit  Subjective:     Patient ID: Jasmine Keith, female    DOB: 28-Jul-1951, 71 y.o.   MRN: [id number]  CC: ED follow-up, low magnesium   HPI Patient is in today for ED follow-up, low magnesium.   Patient was hospitalized a few weeks ago for C.diff. She followed up with PCP yesterday and Mag was noted to be low - she had not been taking supplementation (500 mg BID) because it made her sick, so PCP changed to smaller, more frequent dosing to see if she would tolerate better (hasn't yet started, says she is going to try 250 mg QID with food). Labs resulted last night with critically low Mag of 0.8 and she was sent to the ED. She received 2g IV Mag and NS before being discharged home. Labs have not yet been rechecked. No ED note available in [facility] at this time.   Feeling good the past few days. No complaints/concerns today. Feeling a little nervous, but otherwise fine.    ROS All review of systems negative except what is listed in the HPI      Objective:    BP (!) 134/40   Pulse 97   Ht '5\' 1"'$  (1.549 m)   Wt 107 lb 12.8 oz (48.9 kg)   BMI 20.37 kg/m    Physical Exam Vitals reviewed.  Constitutional:      Appearance: Normal appearance.  Cardiovascular:     Rate and Rhythm: Normal rate and regular rhythm.  Pulmonary:     Effort: Pulmonary effort is normal.     Breath sounds: Normal breath sounds.  Abdominal:     General: Abdomen is flat. Bowel sounds are normal. There is no distension.     Palpations: Abdomen is soft.     Tenderness: There is no abdominal tenderness. There is no guarding.  Skin:    General: Skin is warm and dry.  Neurological:     General: No focal deficit present.     Mental Status: She is alert and oriented to person, place, and time. Mental status is at baseline.  Psychiatric:        Mood and Affect: Mood normal.        Behavior: Behavior normal.        Thought Content: Thought content normal.        Judgment: Judgment normal.      No results found for any visits on 06/21/22.      Assessment & Plan:   Problem List Items Addressed This Visit       Other   Hypomagnesemia - Primary Updating labs today. We will let you know results.  Plan on supplementation as discussed with Percell Miller yesterday and follow-up in 7-10 days to ensure adequate response.     Relevant Orders   Basic metabolic panel   Magnesium    No orders of the defined types were placed in this encounter.   Return in about 1 week (around 06/28/2022) for PCP f/u low mag.  Terrilyn Saver, NP

## 2022-06-21 NOTE — Telephone Encounter (Signed)
Patient stopped by the office and requested her labs, I advised her of Taylor's message and she stated she understood and will follow up with Percell Miller.

## 2022-06-22 ENCOUNTER — Other Ambulatory Visit: Payer: Self-pay | Admitting: Medical

## 2022-07-04 ENCOUNTER — Ambulatory Visit (INDEPENDENT_AMBULATORY_CARE_PROVIDER_SITE_OTHER): Payer: Medicare Other | Admitting: Medical

## 2022-07-04 DIAGNOSIS — K219 Gastro-esophageal reflux disease without esophagitis: Secondary | ICD-10-CM

## 2022-07-04 DIAGNOSIS — M542 Cervicalgia: Secondary | ICD-10-CM

## 2022-07-04 DIAGNOSIS — R300 Dysuria: Secondary | ICD-10-CM | POA: Diagnosis not present

## 2022-07-04 DIAGNOSIS — R5383 Other fatigue: Secondary | ICD-10-CM

## 2022-07-04 DIAGNOSIS — R590 Localized enlarged lymph nodes: Secondary | ICD-10-CM

## 2022-07-04 LAB — POC URINALSYSI DIPSTICK (AUTOMATED)
Bilirubin, UA: NEGATIVE
Blood, UA: NEGATIVE
Glucose, UA: NEGATIVE
Ketones, UA: NEGATIVE
Nitrite, UA: NEGATIVE
Protein, UA: POSITIVE — AB
Spec Grav, UA: 1.02 (ref 1.010–1.025)
Urobilinogen, UA: 0.2 E.U./dL
pH, UA: 5 (ref 5.0–8.0)

## 2022-07-04 NOTE — Patient Instructions (Addendum)
Low magnesium chronically- hopefully levels will be up today. Repeat mg level today.  Fatigue- will get cbc, cmp, tsh, t4, b12 and iron level.  Dysuria with frequent urination- POCT UA and urine culture today. Rx keflex 500 mg bid x 7 days pending urine culture results.  For gerd- continue protonix and bentyl prescribed by hospital. Placed referral to GI MD. Defer refilling of reglan to GI MD.  Anterior neck pain and mild tender lymph nodes will refer back to ENT seen in February.   Htn- bp despite current 3 med regimen. Want you to check bp in am next 3 days and let me know results on Friday morning. If you see systolic 032 or higher let me know sooner.  Follow up in 2-3 weeks or sooner if needed.

## 2022-07-04 NOTE — Progress Notes (Signed)
Subjective:    Patient ID: Jasmine Keith, female    DOB: 04-08-1951, 71 y.o.   MRN: [id number]  HPI   Pt in for follow up.    Pt has been having various ED visit due to persistent low mag levels.   She has been on  mag 250 mg qid since last visit from ED. Then she switched to 250 mg tid after she states had mild stomach upset.  No muscle cramps and no reported palpitations.  Pt has been eating better since last ED visit per her husband.  Pt does report fatigue. Known low b12 level in past.  At times she repots some pain in anterior neck. She points over her thyroid region. This occurred the other day. No pain presently. Pt does have history of heart burn. Husband got roloaids and she stated reflux symptoms resolved.  In hospital gi MD rx'd reglan and bentyl.  Pt has hx elevated troponin when in [place]. For this placed referral to cardiologist. She has appointment sept 11, 2023.  Pt states she is beginning to have some frequent and burning urination. She had uti dx in [place]. She got keflex.        Review of Systems  Constitutional:  Positive for fatigue. Negative for chills and fever.  Respiratory:  Negative for cough, chest tightness, shortness of breath and wheezing.   Cardiovascular:  Negative for chest pain and palpitations.  Gastrointestinal:  Negative for abdominal distention, constipation, diarrhea, nausea and rectal pain.       Belching intermittent.  Genitourinary:  Positive for dysuria and frequency.  Musculoskeletal:  Positive for neck pain.       None presently.  Neurological:  Negative for dizziness and headaches.  Hematological:  Negative for adenopathy. Does not bruise/bleed easily.  Psychiatric/Behavioral:  Negative for behavioral problems and confusion.     Past Medical History:  Diagnosis Date   Anxiety    Diabetes mellitus without complication (HCC)    Fibromyalgia    GERD (gastroesophageal reflux disease)    Hyperlipidemia     Hypertension    Osteoporosis    Thyroid disease      Social History   Socioeconomic History   Marital status: Married    Spouse name: Not on file   Number of children: Not on file   Years of education: Not on file   Highest education level: Not on file  Occupational History   Not on file  Tobacco Use   Smoking status: Every Day    Packs/day: 0.50    Types: Cigarettes   Smokeless tobacco: Never  Vaping Use   Vaping Use: Never used  Substance and Sexual Activity   Alcohol use: Yes    Comment: Rare   Drug use: Not Currently   Sexual activity: Not on file  Other Topics Concern   Not on file  Social History Narrative   Not on file   Social Determinants of Health   Financial Resource Strain: Low Risk  (01/19/2022)   Overall Financial Resource Strain (CARDIA)    Difficulty of Paying Living Expenses: Not hard at all  Food Insecurity: No Food Insecurity (05/31/2022)   Hunger Vital Sign    Worried About Running Out of Food in the Last Year: Never true    Ran Out of Food in the Last Year: Never true  Transportation Needs: No Transportation Needs (05/31/2022)   PRAPARE - Hydrologist (Medical): No  Lack of Transportation (Non-Medical): No  Physical Activity: Insufficiently Active (01/19/2022)   Exercise Vital Sign    Days of Exercise per Week: 7 days    Minutes of Exercise per Session: 20 min  Stress: No Stress Concern Present (01/19/2022)   [place]    Feeling of Stress : Not at all  Social Connections: Socially Isolated (01/19/2022)   Social Connection and Isolation Panel [NHANES]    Frequency of Communication with Friends and Family: Twice a week    Frequency of Social Gatherings with Friends and Family: Never    Attends Religious Services: Never    Marine scientist or Organizations: No    Attends Archivist Meetings: Never    Marital Status: Married  Arboriculturist Violence: Not At Risk (01/19/2022)   Humiliation, Afraid, Rape, and Kick questionnaire    Fear of Current or Ex-Partner: No    Emotionally Abused: No    Physically Abused: No    Sexually Abused: No    Past Surgical History:  Procedure Laterality Date   coronary stent placed  1993    Family History  Problem Relation Age of Onset   Diabetes Mother    Kidney disease Mother    Diabetes Father    Cancer Sister        lung   Kidney disease Sister    Thyroid disease Neg Hx    Colon cancer Neg Hx    Esophageal cancer Neg Hx    Rectal cancer Neg Hx    Stomach cancer Neg Hx     No Known Allergies  Current Outpatient Medications on File Prior to Visit  Medication Sig Dispense Refill   acetaminophen (TYLENOL) 325 MG tablet Take 2 tablets (650 mg total) by mouth every 6 (six) hours as needed for mild pain (or Fever >/= 101). 60 tablet 0   alendronate (FOSAMAX) 70 MG tablet TAKE 1 TABLET BY MOUTH ONCE A WEEK. TAKE WITH A FULL GLASS OF WATER ON AN EMPTY STOMACH. 12 tablet 3   amLODipine (NORVASC) 10 MG tablet TAKE 1 TABLET BY MOUTH EVERY DAY 90 tablet 0   busPIRone (BUSPAR) 15 MG tablet TAKE 1 TABLET BY MOUTH 2 TIMES DAILY. 60 tablet 0   busPIRone (BUSPAR) 7.5 MG tablet Take 1 tablet (7.5 mg total) by mouth 2 (two) times daily. 180 tablet 1   dicyclomine (BENTYL) 10 MG capsule Take 1 capsule (10 mg total) by mouth 4 (four) times daily -  before meals and at bedtime. 120 capsule 0   fenofibrate 160 MG tablet TAKE 1 TABLET BY MOUTH EVERY DAY (Patient taking differently: Take 160 mg by mouth daily.) 90 tablet 0   gabapentin (NEURONTIN) 800 MG tablet Take 1 tablet (800 mg total) by mouth 2 (two) times daily. 60 tablet 2   hydrALAZINE (APRESOLINE) 100 MG tablet Take 100 mg by mouth 2 (two) times daily.     insulin detemir (LEVEMIR) 100 UNIT/ML injection Inject 0.1 mLs (10 Units total) into the skin at bedtime. (Patient taking differently: Inject 20 Units into the skin at bedtime.) 10 mL  11   Insulin Pen Needle (BD PEN NEEDLE NANO U/F) 32G X 4 MM MISC Use one needle daily to inject insulin 30 each 5   levothyroxine (SYNTHROID) 25 MCG tablet TAKE 1 TABLET BY MOUTH EVERY DAY BEFORE BREAKFAST (Patient taking differently: Take 25 mcg by mouth daily before breakfast.) 90 tablet 0   losartan (COZAAR)  100 MG tablet TAKE 1 TABLET BY MOUTH EVERY DAY (Patient taking differently: Take 100 mg by mouth daily.) 90 tablet 3   Magnesium Oxide -Mg Supplement 500 MG CAPS Take 1 capsule (500 mg total) by mouth 2 (two) times daily. For 7 days. 13 capsule 0   meclizine (ANTIVERT) 25 MG tablet Take 1 tablet (25 mg total) by mouth 3 (three) times daily as needed for dizziness. 30 tablet 0   metFORMIN (GLUCOPHAGE) 1000 MG tablet Take 1 tablet (1,000 mg total) by mouth 2 (two) times daily with a meal. 180 tablet 3   metoCLOPramide (REGLAN) 5 MG tablet Take 1 tablet (5 mg total) by mouth 3 (three) times daily before meals for 14 days. 42 tablet 0   metoprolol succinate (TOPROL-XL) 50 MG 24 hr tablet TAKE 1 TABLET BY MOUTH EVERY DAY WITH OR IMMEDIATELY FOLLOWING A MEAL 90 tablet 1   nicotine (NICODERM CQ - DOSED IN MG/24 HOURS) 14 mg/24hr patch Place 1 patch (14 mg total) onto the skin daily. 28 patch 0   ondansetron (ZOFRAN-ODT) 4 MG disintegrating tablet Take 1 tablet (4 mg total) by mouth every 8 (eight) hours as needed for nausea or vomiting. 30 tablet 0   pantoprazole (PROTONIX) 40 MG tablet Take 1 tablet (40 mg total) by mouth 2 (two) times daily before a meal. 60 tablet 0   venlafaxine XR (EFFEXOR-XR) 75 MG 24 hr capsule TAKE 1 CAPSULE BY MOUTH DAILY WITH BREAKFAST. (Patient taking differently: Take 75 mg by mouth daily with breakfast.) 90 capsule 2   No current facility-administered medications on file prior to visit.    BP (!) 156/57   Pulse 88   Resp 18   Ht '5\' 1"'$  (1.549 m)   Wt 106 lb 6.4 oz (48.3 kg)   SpO2 96%   BMI 20.10 kg/m        Objective:   Physical Exam  General Mental  Status- Alert. General Appearance- Not in acute distress.   Skin General: Color- Normal Color. Moisture- Normal Moisture.  Neck Mild tender subandibular nodes. No thyomegaly that I can apprectate.   Chest and Lung Exam Auscultation: Breath Sounds:-Normal.  Cardiovascular Auscultation:Rythm- Regular. Murmurs & Other Heart Sounds:Auscultation of the heart reveals- No Murmurs.  Abdomen Inspection:-Inspeection Normal. Palpation/Percussion:Note:No mass. Palpation and Percussion of the abdomen reveal- Non Tender, Non Distended + BS, no rebound or guarding.   Neurologic Cranial Nerve exam:- CN III-XII intact(No nystagmus), symmetric smile. Strength:- 5/5 equal and symmetric strength both upper and lower extremities.        Assessment & Plan:   Patient Instructions  Low magnesium chronically- hopefully levels will be up today. Repeat mg level today.  Fatigue- will get cbc, cmp, tsh, t4, b12 and iron level.  Dysuria with frequent urination- POCT UA and urine culture today. Rx keflex 500 mg bid x 7 days pending urine culture results.  For gerd- continue protonix and bentyl prescribed by hospital. Placed referral to GI MD. Defer refilling of reglan to GI MD.  Anterior neck pain and mild tender lymph nodes will refer back to ENT seen in February.         Follow up in 2-3 weeks or sooner if needed.   Time spent with patient today was 45  minutes which consisted of chart review, discussing diagnosis, work up, treatment and documentation.

## 2022-07-05 LAB — COMPREHENSIVE METABOLIC PANEL
ALT: 17 U/L (ref 0–35)
AST: 22 U/L (ref 0–37)
Albumin: 4.6 g/dL (ref 3.5–5.2)
Alkaline Phosphatase: 44 U/L (ref 39–117)
BUN: 26 mg/dL — ABNORMAL HIGH (ref 6–23)
CO2: 22 mEq/L (ref 19–32)
Calcium: 10.2 mg/dL (ref 8.4–10.5)
Chloride: 103 mEq/L (ref 96–112)
Creatinine, Ser: 1.12 mg/dL (ref 0.40–1.20)
GFR: 50.11 mL/min — ABNORMAL LOW (ref >60.00)
Glucose, Bld: 81 mg/dL (ref 70–99)
Potassium: 4.8 mEq/L (ref 3.5–5.1)
Sodium: 137 mEq/L (ref 135–145)
Total Bilirubin: 0.2 mg/dL (ref 0.2–1.2)
Total Protein: 7.5 g/dL (ref 6.0–8.3)

## 2022-07-05 LAB — CBC WITH DIFFERENTIAL/PLATELET
Basophils Absolute: 0.1 10*3/uL (ref 0.0–0.1)
Basophils Relative: 1.6 % (ref 0.0–3.0)
Eosinophils Absolute: 0.1 10*3/uL (ref 0.0–0.7)
Eosinophils Relative: 1.5 % (ref 0.0–5.0)
HCT: 31.3 % — ABNORMAL LOW (ref 36.0–46.0)
Hemoglobin: 10.2 g/dL — ABNORMAL LOW (ref 12.0–15.0)
Lymphocytes Relative: 19.4 % (ref 12.0–46.0)
Lymphs Abs: 1.8 10*3/uL (ref 0.7–4.0)
MCHC: 32.6 g/dL (ref 30.0–36.0)
MCV: 82.2 fl (ref 78.0–100.0)
Monocytes Absolute: 0.5 10*3/uL (ref 0.1–1.0)
Monocytes Relative: 5.1 % (ref 3.0–12.0)
Neutro Abs: 6.7 10*3/uL (ref 1.4–7.7)
Neutrophils Relative %: 72.4 % (ref 43.0–77.0)
Platelets: 503 10*3/uL — ABNORMAL HIGH (ref 150.0–400.0)
RBC: 3.81 Mil/uL — ABNORMAL LOW (ref 3.87–5.11)
RDW: 14.8 % (ref 11.5–15.5)
WBC: 9.2 10*3/uL (ref 4.0–10.5)

## 2022-07-05 LAB — TSH: TSH: 4.3 u[IU]/mL (ref 0.35–5.50)

## 2022-07-05 LAB — MAGNESIUM: Magnesium: 1.8 mg/dL (ref 1.5–2.5)

## 2022-07-05 LAB — VITAMIN B12: Vitamin B-12: 221 pg/mL (ref 211–911)

## 2022-07-05 LAB — T4, FREE: Free T4: 0.94 ng/dL (ref 0.60–1.60)

## 2022-07-06 MED ORDER — CIPROFLOXACIN HCL 500 MG PO TABS: 500.0000 mg | ORAL_TABLET | Freq: Two times a day (BID) | ORAL | 0 refills | Status: DC

## 2022-07-06 NOTE — Addendum Note (Signed)
Addended by: Anabel Halon on: 07/06/2022 05:43 PM   Modules accepted: Orders

## 2022-07-07 LAB — URINE CULTURE
MICRO NUMBER:: 13846811
SPECIMEN QUALITY:: ADEQUATE

## 2022-07-09 ENCOUNTER — Other Ambulatory Visit: Payer: Self-pay | Admitting: Medical

## 2022-07-13 DIAGNOSIS — M810 Age-related osteoporosis without current pathological fracture: Secondary | ICD-10-CM | POA: Insufficient documentation

## 2022-07-13 DIAGNOSIS — E119 Type 2 diabetes mellitus without complications: Secondary | ICD-10-CM | POA: Insufficient documentation

## 2022-07-13 DIAGNOSIS — M797 Fibromyalgia: Secondary | ICD-10-CM | POA: Insufficient documentation

## 2022-07-17 ENCOUNTER — Ambulatory Visit: Payer: Medicare Other | Attending: Cardiology | Admitting: Cardiology

## 2022-07-17 ENCOUNTER — Encounter: Payer: Self-pay | Admitting: Cardiology

## 2022-07-17 VITALS — BP 152/64 | HR 86 | Ht 61.0 in | Wt 106.6 lb

## 2022-07-17 DIAGNOSIS — I714 Abdominal aortic aneurysm, without rupture, unspecified: Secondary | ICD-10-CM

## 2022-07-17 DIAGNOSIS — F17210 Nicotine dependence, cigarettes, uncomplicated: Secondary | ICD-10-CM | POA: Insufficient documentation

## 2022-07-17 DIAGNOSIS — I209 Angina pectoris, unspecified: Secondary | ICD-10-CM

## 2022-07-17 DIAGNOSIS — I10 Essential (primary) hypertension: Secondary | ICD-10-CM

## 2022-07-17 DIAGNOSIS — I2510 Atherosclerotic heart disease of native coronary artery without angina pectoris: Secondary | ICD-10-CM

## 2022-07-17 DIAGNOSIS — E782 Mixed hyperlipidemia: Secondary | ICD-10-CM

## 2022-07-17 DIAGNOSIS — E088 Diabetes mellitus due to underlying condition with unspecified complications: Secondary | ICD-10-CM

## 2022-07-17 HISTORY — DX: Atherosclerotic heart disease of native coronary artery without angina pectoris: I25.10

## 2022-07-17 HISTORY — DX: Abdominal aortic aneurysm, without rupture, unspecified: I71.40

## 2022-07-17 HISTORY — DX: Nicotine dependence, cigarettes, uncomplicated: F17.210

## 2022-07-17 HISTORY — DX: Angina pectoris, unspecified: I20.9

## 2022-07-17 HISTORY — DX: Diabetes mellitus due to underlying condition with unspecified complications: E08.8

## 2022-07-17 MED ORDER — NITROGLYCERIN 0.4 MG SL SUBL: 0.4000 mg | SUBLINGUAL_TABLET | SUBLINGUAL | 6 refills | Status: DC | PRN

## 2022-07-17 MED ORDER — ASPIRIN 81 MG PO TBEC: 81.0000 mg | DELAYED_RELEASE_TABLET | Freq: Every day | ORAL | 3 refills | Status: DC

## 2022-07-17 NOTE — Addendum Note (Signed)
Addended by: Truddie Hidden on: 07/17/2022 11:53 AM   Modules accepted: Orders

## 2022-07-17 NOTE — Patient Instructions (Addendum)
Medication Instructions:  Your physician has recommended you make the following change in your medication:   Take 81 mg coated aspirin daily.  Use nitroglycerin 1 tablet placed under the tongue at the first sign of chest pain or an angina attack. 1 tablet may be used every 5 minutes as needed, for up to 15 minutes. Do not take more than 3 tablets in 15 minutes. If pain persist call 911 or go to the nearest ED.   *If you need a refill on your cardiac medications before your next appointment, please call your pharmacy*   Lab Work: Your physician recommends that you have a BMET and CBC today in the office for your upcoming procedure.  If you have labs (blood work) drawn today and your tests are completely normal, you will receive your results only by: [place] (if you have MyChart) OR A paper copy in the mail If you have any lab test that is abnormal or we need to change your treatment, we will call you to review the results.   Testing/Procedures:  [place] [facility] DEPT OF [facility] DEPT OF [facility] [place] [address] Dept: [phone] Loc: [facility]  07/17/2022  You are scheduled for a Cardiac Catheterization on Friday, September 15 with Dr. Daneen Schick.  1. Please arrive at the [facility] (Main Entrance A) at [facility]: [address] [place], [place] [address] at 7:00 AM (This time is two hours before your procedure to ensure your preparation). Free valet parking service is available.   Special note: Every effort is made to have your procedure done on time. Please understand that emergencies sometimes delay scheduled procedures.  2. Diet: Do not eat solid foods after midnight.  The patient may have clear liquids until 5am upon the day of the procedure.  3. Labs: You had your labs done today in the office.  4. Medication instructions in  preparation for your procedure:   Contrast Allergy: No  Stop taking, Cozaar (Losartan) Friday, September 15,  Take only 50 units of insulin the night before your procedure. Do not take any insulin on the day of the procedure.  Do not take Diabetes Med Glucophage (Metformin) on the day of the procedure and HOLD 48 HOURS AFTER THE PROCEDURE.  On the morning of your procedure, take your Aspirin and any morning medicines NOT listed above.  You may use sips of water.  5. Plan for one night stay--bring personal belongings. 6. Bring a current list of your medications and current insurance cards. 7. You MUST have a responsible person to drive you home. 8. Someone MUST be with you the first 24 hours after you arrive home or your discharge will be delayed. 9. Please wear clothes that are easy to get on and off and wear slip-on shoes.  Thank you for allowing [place] to care for you!   -- [facility]   Your physician has requested that you have an echocardiogram. Echocardiography is a painless test that uses sound waves to create images of your heart. It provides your doctor with information about the size and shape of your heart and how well your heart's chambers and valves are working. This procedure takes approximately one hour. There are no restrictions for this procedure.  Follow-Up: At [facility], you and your health needs are our priority.  As part of our  continuing mission to provide you with exceptional heart care, we have created designated Provider Care Teams.  These Care Teams include your primary Cardiologist (physician) and Advanced Practice Providers (APPs -  Physician Assistants and Nurse Practitioners) who all work together to provide you with the care you need, when you need it.  We recommend signing up for the patient portal called "MyChart".  Sign up information is provided on this After Visit Summary.  MyChart is used to connect with patients for  Virtual Visits (Telemedicine).  Patients are able to view lab/test results, encounter notes, upcoming appointments, etc.  Non-urgent messages can be sent to your provider as well.   To learn more about what you can do with MyChart, go to [email or web address].    Your next appointment:   1 month(s)  The format for your next appointment:   In Person  Provider:   Jyl Heinz, MD   Other Instructions  Coronary Angiogram With Stent Coronary angiogram with stent placement is a procedure to widen or open a narrow blood vessel of the heart (coronary artery). Arteries may become blocked by cholesterol buildup (plaques) in the lining of the artery wall. When a coronary artery becomes partially blocked, blood flow to that area decreases. This may lead to chest pain or a heart attack (myocardial infarction). A stent is a small piece of metal that looks like mesh or spring. Stent placement may be done as treatment after a heart attack, or to prevent a heart attack if a blocked artery is found by a coronary angiogram. Let your health care provider know about: Any allergies you have, including allergies to medicines or contrast dye. All medicines you are taking, including vitamins, herbs, eye drops, creams, and over-the-counter medicines. Any problems you or family members have had with anesthetic medicines. Any blood disorders you have. Any surgeries you have had. Any medical conditions you have, including kidney problems or kidney failure. Whether you are pregnant or may be pregnant. Whether you are breastfeeding. What are the risks? Generally, this is a safe procedure. However, serious problems may occur, including: Damage to nearby structures or organs, such as the heart, blood vessels, or kidneys. A return of blockage. Bleeding, infection, or bruising at the insertion site. A collection of blood under the skin (hematoma) at the insertion site. A blood clot in another part of the  body. Allergic reaction to medicines or dyes. Bleeding into the abdomen (retroperitoneal bleeding). Stroke (rare). Heart attack (rare). What happens before the procedure? Staying hydrated Follow instructions from your health care provider about hydration, which may include: Up to 2 hours before the procedure - you may continue to drink clear liquids, such as water, clear fruit juice, black coffee, and plain tea.    Eating and drinking restrictions Follow instructions from your health care provider about eating and drinking, which may include: 8 hours before the procedure - stop eating heavy meals or foods, such as meat, fried foods, or fatty foods. 6 hours before the procedure - stop eating light meals or foods, such as toast or cereal. 2 hours before the procedure - stop drinking clear liquids. Medicines Ask your health care provider about: Changing or stopping your regular medicines. This is especially important if you are taking diabetes medicines or blood thinners. Taking medicines such as aspirin and ibuprofen. These medicines can thin your blood. Do not take these medicines unless your health care provider tells you to take them. Generally, aspirin is recommended before a thin tube, called a  catheter, is passed through a blood vessel and inserted into the heart (cardiac catheterization). Taking over-the-counter medicines, vitamins, herbs, and supplements. General instructions Do not use any products that contain nicotine or tobacco for at least 4 weeks before the procedure. These products include cigarettes, e-cigarettes, and chewing tobacco. If you need help quitting, ask your health care provider. Plan to have someone take you home from the hospital or clinic. If you will be going home right after the procedure, plan to have someone with you for 24 hours. You may have tests and imaging procedures. Ask your health care provider: How your insertion site will be marked. Ask which  artery will be used for the procedure. What steps will be taken to help prevent infection. These may include: Removing hair at the insertion site. Washing skin with a germ-killing soap. Taking antibiotic medicine. What happens during the procedure? An IV will be inserted into one of your veins. Electrodes may be placed on your chest to monitor your heart rate during the procedure. You will be given one or more of the following: A medicine to help you relax (sedative). A medicine to numb the area (local anesthetic) for catheter insertion. A small incision will be made for catheter insertion. The catheter will be inserted into an artery using a guide wire. The location may be in your groin, your wrist, or the fold of your arm (near your elbow). An X-ray procedure (fluoroscopy) will be used to help guide the catheter to the opening of the heart arteries. A dye will be injected into the catheter. X-rays will be taken. The dye helps to show where any narrowing or blockages are located in the arteries. Tell your health care provider if you have chest pain or trouble breathing. A tiny wire will be guided to the blocked spot, and a balloon will be inflated to make the artery wider. The stent will be expanded to crush the plaques into the wall of the vessel. The stent will hold the area open and improve the blood flow. Most stents have a drug coating to reduce the risk of the stent narrowing over time. The artery may be made wider using a drill, laser, or other tools that remove plaques. The catheter will be removed when the blood flow improves. The stent will stay where it was placed, and the lining of the artery will grow over it. A bandage (dressing) will be placed on the insertion site. Pressure will be applied to stop bleeding. The IV will be removed. This procedure may vary among health care providers and hospitals.    What happens after the procedure? Your blood pressure, heart rate, breathing  rate, and blood oxygen level will be monitored until you leave the hospital or clinic. If the procedure is done through the leg, you will lie flat in bed for a few hours or for as long as told by your health care provider. You will be instructed not to bend or cross your legs. The insertion site and the pulse in your foot or wrist will be checked often. You may have more blood tests, X-rays, and a test that records the electrical activity of your heart (electrocardiogram, or ECG). Do not drive for 24 hours if you were given a sedative during your procedure. Summary Coronary angiogram with stent placement is a procedure to widen or open a narrowed coronary artery. This is done to treat heart problems. Before the procedure, let your health care provider know about all the medical  conditions and surgeries you have or have had. This is a safe procedure. However, some problems may occur, including damage to nearby structures or organs, bleeding, blood clots, or allergies. Follow your health care provider's instructions about eating, drinking, medicines, and other lifestyle changes, such as quitting tobacco use before the procedure. This information is not intended to replace advice given to you by your health care provider. Make sure you discuss any questions you have with your health care provider. Document Revised: 05/14/2019 Document Reviewed: 05/14/2019 Elsevier Patient Education  2021 [place].  Aspirin and Your Heart Aspirin is a medicine that prevents the platelets in your blood from sticking together. Platelets are the cells that your blood uses for clotting. Aspirin can be used to help reduce the risk of blood clots, heart attacks, and other heart-related problems. What are the risks? Daily use of aspirin can cause side effects. Some of these include: Bleeding. Bleeding can be minor or serious. An example of minor bleeding is bleeding from a cut, and the bleeding does not stop. An example  of more serious bleeding is stomach bleeding or, rarely, bleeding into the brain. Your risk of bleeding increases if you are also taking NSAIDs, such as ibuprofen. Increased bruising. Upset stomach. An allergic reaction. People who have growths inside the nose (nasal polyps) have an increased risk of developing an aspirin allergy. How to use aspirin to care for your heart Take aspirin only as told by your health care provider. Make sure that you understand how much to take and what form to take. The two forms of aspirin are: Non-enteric-coated.This type of aspirin does not have a coating and is absorbed quickly. This type of aspirin also comes in a chewable form. Enteric-coated. This type of aspirin has a coating that releases the medicine very slowly. Enteric-coated aspirin might cause less stomach upset than non-enteric-coated aspirin. This type of aspirin should not be chewed or crushed. Work with your health care provider to find out whether it is safe and beneficial for you to take aspirin daily. Taking aspirin daily may be helpful if: You have had a heart attack or chest pain, or you are at risk for a heart attack. You have a condition in which certain heart vessels are blocked (coronary artery disease), and you have had a procedure to treat it. Examples are: Open-heart surgery, such as coronary artery bypass surgery (CABG). Coronary angioplasty,which is done to widen a blood vessel of your heart. Having a small mesh tube, or stent, placed in your coronary artery. You have had certain types of stroke or a mini-stroke known as a transient ischemic attack (TIA). You have a narrowing of the arteries that supply the limbs (peripheral artery disease, or PAD). You have long-term (chronic) heart rhythm problems, such as atrial fibrillation, and your health care provider thinks aspirin may help. You have valve disease or have had surgery on a valve. You are considered at increased risk of developing  coronary artery disease or PAD.    Follow these instructions at home Medicines Take over-the-counter and prescription medicines only as told by your health care provider. If you are taking blood thinners: Talk with your health care provider before you take any medicines that contain aspirin or NSAIDs, such as ibuprofen. These medicines increase your risk for dangerous bleeding. Take your medicine exactly as told, at the same time every day. Avoid activities that could cause injury or bruising, and follow instructions about how to prevent falls. Wear a medical alert  bracelet or carry a card that lists what medicines you take. General instructions Do not drink alcohol if: Your health care provider tells you not to drink. You are pregnant, may be pregnant, or are planning to become pregnant. If you drink alcohol: Limit how much you use to: 0-1 drink a day for women. 0-2 drinks a day for men. Be aware of how much alcohol is in your drink. In the [place] one drink equals one 12 oz bottle of beer (355 mL), one 5 oz glass of wine (148 mL), or one 1 oz glass of hard liquor (44 mL). Keep all follow-up visits as told by your health care provider. This is important. Where to find more information The American Heart Association: www.heart.org Contact a health care provider if you have: Unusual bleeding or bruising. Stomach pain or nausea. Ringing in your ears. An allergic reaction that causes hives, itchy skin, or swelling of the lips, tongue, or face. Get help right away if: You notice that your bowel movements are bloody, or dark red or black in color. You vomit or cough up blood. You have blood in your urine. You cough, breathe loudly (wheeze), or feel short of breath. You have chest pain, especially if the pain spreads to your arms, back, neck, or jaw. You have a headache with confusion. You have any symptoms of a stroke. "BE FAST" is an easy way to remember the main warning signs of a  stroke: B - Balance. Signs are dizziness, sudden trouble walking, or loss of balance. E - Eyes. Signs are trouble seeing or a sudden change in vision. F - Face. Signs are sudden weakness or numbness of the face, or the face or eyelid drooping on one side. A - Arms. Signs are weakness or numbness in an arm. This happens suddenly and usually on one side of the body. S - Speech. Signs are sudden trouble speaking, slurred speech, or trouble understanding what people say. T - Time. Time to call emergency services. Write down what time symptoms started. You have other signs of a stroke, such as: A sudden, severe headache with no known cause. Nausea or vomiting. Seizure. These symptoms may represent a serious problem that is an emergency. Do not wait to see if the symptoms will go away. Get medical help right away. Call your local emergency services (911 in the [place] Do not drive yourself to the hospital. Summary Aspirin use can help reduce the risk of blood clots, heart attacks, and other heart-related problems. Daily use of aspirin can cause side effects. Take aspirin only as told by your health care provider. Make sure that you understand how much to take and what form to take. Your health care provider will help you determine whether it is safe and beneficial for you to take aspirin daily. This information is not intended to replace advice given to you by your health care provider. Make sure you discuss any questions you have with your health care provider. Document Revised: 07/28/2019 Document Reviewed: 07/28/2019 Elsevier Patient Education  2021 [place]. Nitroglycerin sublingual tablets What is this medicine? NITROGLYCERIN (nye troe GLI ser in) is a type of vasodilator. It relaxes blood vessels, increasing the blood and oxygen supply to your heart. This medicine is used to relieve chest pain caused by angina. It is also used to prevent chest pain before activities like climbing stairs,  going outdoors in cold weather, or sexual activity. This medicine may be used for other purposes; ask your health care  provider or pharmacist if you have questions. COMMON BRAND NAME(S): Nitroquick, Nitrostat, Nitrotab What should I tell my health care provider before I take this medicine? They need to know if you have any of these conditions: anemia head injury, recent stroke, or bleeding in the brain liver disease previous heart attack an unusual or allergic reaction to nitroglycerin, other medicines, foods, dyes, or preservatives pregnant or trying to get pregnant breast-feeding How should I use this medicine? Take this medicine by mouth as needed. Use at the first sign of an angina attack (chest pain or tightness). You can also take this medicine 5 to 10 minutes before an event likely to produce chest pain. Follow the directions exactly as written on the prescription label. Place one tablet under your tongue and let it dissolve. Do not swallow whole. Replace the dose if you accidentally swallow it. It will help if your mouth is not dry. Saliva around the tablet will help it to dissolve more quickly. Do not eat or drink, smoke or chew tobacco while a tablet is dissolving. Sit down when taking this medicine. In an angina attack, you should feel better within 5 minutes after your first dose. You can take a dose every 5 minutes up to a total of 3 doses. If you do not feel better or feel worse after 1 dose, call 9-1-1 at once. Do not take more than 3 doses in 15 minutes. Your health care provider might give you other directions. Follow those directions if he or she does. Do not take your medicine more often than directed. Talk to your health care provider about the use of this medicine in children. Special care may be needed. Overdosage: If you think you have taken too much of this medicine contact a poison control center or emergency room at once. NOTE: This medicine is only for you. Do not share  this medicine with others. What if I miss a dose? This does not apply. This medicine is only used as needed. What may interact with this medicine? Do not take this medicine with any of the following medications: certain migraine medicines like ergotamine and dihydroergotamine (DHE) medicines used to treat erectile dysfunction like sildenafil, tadalafil, and vardenafil riociguat This medicine may also interact with the following medications: alteplase aspirin heparin medicines for high blood pressure medicines for mental depression other medicines used to treat angina phenothiazines like chlorpromazine, mesoridazine, prochlorperazine, thioridazine This list may not describe all possible interactions. Give your health care provider a list of all the medicines, herbs, non-prescription drugs, or dietary supplements you use. Also tell them if you smoke, drink alcohol, or use illegal drugs. Some items may interact with your medicine. What should I watch for while using this medicine? Tell your doctor or health care professional if you feel your medicine is no longer working. Keep this medicine with you at all times. Sit or lie down when you take your medicine to prevent falling if you feel dizzy or faint after using it. Try to remain calm. This will help you to feel better faster. If you feel dizzy, take several deep breaths and lie down with your feet propped up, or bend forward with your head resting between your knees. You may get drowsy or dizzy. Do not drive, use machinery, or do anything that needs mental alertness until you know how this drug affects you. Do not stand or sit up quickly, especially if you are an older patient. This reduces the risk of dizzy or fainting spells.  Alcohol can make you more drowsy and dizzy. Avoid alcoholic drinks. Do not treat yourself for coughs, colds, or pain while you are taking this medicine without asking your doctor or health care professional for advice.  Some ingredients may increase your blood pressure. What side effects may I notice from receiving this medicine? Side effects that you should report to your doctor or health care professional as soon as possible: allergic reactions (skin rash, itching or hives; swelling of the face, lips, or tongue) low blood pressure (dizziness; feeling faint or lightheaded, falls; unusually weak or tired) low red blood cell counts (trouble breathing; feeling faint; lightheaded, falls; unusually weak or tired) Side effects that usually do not require medical attention (report to your doctor or health care professional if they continue or are bothersome): facial flushing (redness) headache nausea, vomiting This list may not describe all possible side effects. Call your doctor for medical advice about side effects. You may report side effects to FDA at 1-800-FDA-1088. Where should I keep my medicine? Keep out of the reach of children. Store at room temperature between 20 and 25 degrees C (68 and 77 degrees F). Store in [employer]. Protect from light and moisture. Keep tightly closed. Throw away any unused medicine after the expiration date. NOTE: This sheet is a summary. It may not cover all possible information. If you have questions about this medicine, talk to your doctor, pharmacist, or health care provider.  2021 Elsevier/Gold Standard (2018-07-24 16:46:32)
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
## 2022-07-18 ENCOUNTER — Encounter: Payer: Self-pay | Admitting: Nurse Practitioner

## 2022-07-18 ENCOUNTER — Ambulatory Visit (INDEPENDENT_AMBULATORY_CARE_PROVIDER_SITE_OTHER): Payer: Medicare Other | Admitting: Nurse Practitioner

## 2022-07-18 ENCOUNTER — Encounter: Payer: Self-pay | Admitting: *Deleted

## 2022-07-18 VITALS — BP 155/65 | HR 88 | Ht 61.0 in | Wt 107.0 lb

## 2022-07-18 DIAGNOSIS — I209 Angina pectoris, unspecified: Secondary | ICD-10-CM | POA: Diagnosis not present

## 2022-07-18 DIAGNOSIS — D649 Anemia, unspecified: Secondary | ICD-10-CM

## 2022-07-18 DIAGNOSIS — K219 Gastro-esophageal reflux disease without esophagitis: Secondary | ICD-10-CM

## 2022-07-18 DIAGNOSIS — R112 Nausea with vomiting, unspecified: Secondary | ICD-10-CM | POA: Diagnosis not present

## 2022-07-18 LAB — CBC
Hematocrit: 30.6 % — ABNORMAL LOW (ref 34.0–46.6)
Hemoglobin: 10 g/dL — ABNORMAL LOW (ref 11.1–15.9)
MCH: 26.5 pg — ABNORMAL LOW (ref 26.6–33.0)
MCHC: 32.7 g/dL (ref 31.5–35.7)
MCV: 81 fL (ref 79–97)
Platelets: 462 10*3/uL — ABNORMAL HIGH (ref 150–450)
RBC: 3.77 x10E6/uL (ref 3.77–5.28)
RDW: 14.4 % (ref 11.7–15.4)
WBC: 9.5 10*3/uL (ref 3.4–10.8)

## 2022-07-18 LAB — BASIC METABOLIC PANEL
BUN/Creatinine Ratio: 21 (ref 12–28)
BUN: 22 mg/dL (ref 8–27)
CO2: 16 mmol/L — ABNORMAL LOW (ref 20–29)
Calcium: 10 mg/dL (ref 8.7–10.3)
Chloride: 102 mmol/L (ref 96–106)
Creatinine, Ser: 1.03 mg/dL — ABNORMAL HIGH (ref 0.57–1.00)
Glucose: 89 mg/dL (ref 70–99)
Potassium: 4.7 mmol/L (ref 3.5–5.2)
Sodium: 136 mmol/L (ref 134–144)
eGFR: 59 mL/min/{1.73_m2} — ABNORMAL LOW (ref >59)

## 2022-07-18 NOTE — Patient Instructions (Addendum)
_______________________________________________________  If you are age 70 or older, your body mass index should be between 23-30. Your Body mass index is 20.22 kg/m. If this is out of the aforementioned range listed, please consider follow up with your Primary Care Provider.  If you are age 53 or younger, your body mass index should be between 19-25. Your Body mass index is 20.22 kg/m. If this is out of the aformentioned range listed, please consider follow up with your Primary Care Provider.   ________________________________________________________  The [facility] providers would like to encourage you to use [facility] to communicate with providers for non-urgent requests or questions.  Due to long hold times on the telephone, sending your provider a message by [facility] may be a faster and more efficient way to get a response.  Please allow 48 business hours for a response.  Please remember that this is for non-urgent requests.  _______________________________________________________  Decrease Protonix to daily in the morning  Bentyl up to 4 times a day as needed. So before meals and at bedtime as needed.  It was a pleasure to see you today!  Thank you for trusting me with your gastrointestinal care!
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
## 2022-07-19 ENCOUNTER — Other Ambulatory Visit: Payer: Self-pay | Admitting: Endocrinology

## 2022-07-19 ENCOUNTER — Other Ambulatory Visit: Payer: Self-pay | Admitting: Medical

## 2022-07-19 DIAGNOSIS — R5383 Other fatigue: Secondary | ICD-10-CM

## 2022-07-20 ENCOUNTER — Other Ambulatory Visit: Payer: Self-pay | Admitting: Medical

## 2022-07-20 ENCOUNTER — Telehealth: Payer: Self-pay | Admitting: *Deleted

## 2022-07-20 NOTE — H&P (Signed)
DM II; Smoker; hyperlipidemia, and Hypertension. H/O PTCA remote (29 years ago) Angina on exertion x 3 months

## 2022-07-20 NOTE — Telephone Encounter (Addendum)
Cardiac Catheterization scheduled at [facility] for: Friday July 21, 2022 9 AM Arrival time and place: [facility] Main Entrance A at: 7 AM  Nothing to eat after midnight prior to procedure, clear liquids until 5 AM day of procedure.  Medication instructions: -Hold:  Insulin-AM of procedure-1/2 usual Insulin HS prior to procedure  Metformin-day of procedure and 48 hours post procedure  Losartan-AM of procedure-per protocol GFR 59 -Except hold medication usual morning medications can be taken with sips of water including aspirin 81 mg.  Confirmed patient has responsible adult to drive home post procedure and be with patient first 24 hours after arriving home.  Patient reports no new symptoms concerning for COVID-19 in the past 10 days.  Reviewed procedure instructions with patient.

## 2022-07-21 ENCOUNTER — Other Ambulatory Visit: Payer: Self-pay

## 2022-07-21 ENCOUNTER — Ambulatory Visit (HOSPITAL_COMMUNITY)
Admission: RE | Admit: 2022-07-21 | Discharge: 2022-07-21 | Disposition: A | Discharge status: Home / Self Care | Payer: Medicare Other | Attending: Interventional Cardiology | Admitting: Interventional Cardiology

## 2022-07-21 ENCOUNTER — Encounter (HOSPITAL_COMMUNITY)
Admission: RE | Disposition: A | Discharge status: Home / Self Care | Payer: Self-pay | Source: Home / Self Care | Attending: Interventional Cardiology

## 2022-07-21 ENCOUNTER — Ambulatory Visit: Payer: Medicare Other | Admitting: Medical

## 2022-07-21 DIAGNOSIS — Z720 Tobacco use: Secondary | ICD-10-CM | POA: Diagnosis present

## 2022-07-21 DIAGNOSIS — I714 Abdominal aortic aneurysm, without rupture, unspecified: Secondary | ICD-10-CM | POA: Diagnosis present

## 2022-07-21 DIAGNOSIS — I700 Atherosclerosis of aorta: Secondary | ICD-10-CM | POA: Insufficient documentation

## 2022-07-21 DIAGNOSIS — E119 Type 2 diabetes mellitus without complications: Secondary | ICD-10-CM | POA: Diagnosis not present

## 2022-07-21 DIAGNOSIS — I2510 Atherosclerotic heart disease of native coronary artery without angina pectoris: Secondary | ICD-10-CM

## 2022-07-21 DIAGNOSIS — Z955 Presence of coronary angioplasty implant and graft: Secondary | ICD-10-CM | POA: Diagnosis not present

## 2022-07-21 DIAGNOSIS — I10 Essential (primary) hypertension: Secondary | ICD-10-CM

## 2022-07-21 DIAGNOSIS — T82855A Stenosis of coronary artery stent, initial encounter: Secondary | ICD-10-CM | POA: Insufficient documentation

## 2022-07-21 DIAGNOSIS — F17210 Nicotine dependence, cigarettes, uncomplicated: Secondary | ICD-10-CM | POA: Diagnosis not present

## 2022-07-21 DIAGNOSIS — E088 Diabetes mellitus due to underlying condition with unspecified complications: Secondary | ICD-10-CM

## 2022-07-21 DIAGNOSIS — Z794 Long term (current) use of insulin: Secondary | ICD-10-CM | POA: Insufficient documentation

## 2022-07-21 DIAGNOSIS — E782 Mixed hyperlipidemia: Secondary | ICD-10-CM

## 2022-07-21 DIAGNOSIS — Z7984 Long term (current) use of oral hypoglycemic drugs: Secondary | ICD-10-CM | POA: Diagnosis not present

## 2022-07-21 DIAGNOSIS — I209 Angina pectoris, unspecified: Secondary | ICD-10-CM

## 2022-07-21 DIAGNOSIS — Y832 Surgical operation with anastomosis, bypass or graft as the cause of abnormal reaction of the patient, or of later complication, without mention of misadventure at the time of the procedure: Secondary | ICD-10-CM | POA: Diagnosis not present

## 2022-07-21 DIAGNOSIS — I25119 Atherosclerotic heart disease of native coronary artery with unspecified angina pectoris: Secondary | ICD-10-CM | POA: Diagnosis present

## 2022-07-21 HISTORY — PX: LEFT HEART CATH AND CORONARY ANGIOGRAPHY: CATH118249

## 2022-07-21 LAB — GLUCOSE, CAPILLARY: Glucose-Capillary: 122 mg/dL — ABNORMAL HIGH (ref 70–99)

## 2022-07-21 SURGERY — LEFT HEART CATH AND CORONARY ANGIOGRAPHY
Anesthesia: LOCAL

## 2022-07-21 MED ORDER — FENTANYL CITRATE (PF) 100 MCG/2ML IJ SOLN
INTRAMUSCULAR | Status: AC
  Filled 2022-07-21: qty 2

## 2022-07-21 MED ORDER — LIDOCAINE HCL (PF) 1 % IJ SOLN
INTRAMUSCULAR | Status: DC | PRN
  Administered 2022-07-21: 2 mL via INTRADERMAL

## 2022-07-21 MED ORDER — ASPIRIN 81 MG PO CHEW
81.0000 mg | CHEWABLE_TABLET | ORAL | Status: AC
  Administered 2022-07-21: 81 mg via ORAL
  Filled 2022-07-21: qty 1

## 2022-07-21 MED ORDER — NITROGLYCERIN 1 MG/10 ML FOR IR/CATH LAB
INTRA_ARTERIAL | Status: AC
  Filled 2022-07-21: qty 10

## 2022-07-21 MED ORDER — MIDAZOLAM HCL 2 MG/2ML IJ SOLN
INTRAMUSCULAR | Status: AC
  Filled 2022-07-21: qty 2

## 2022-07-21 MED ORDER — SODIUM CHLORIDE 0.9 % IV SOLN: 250.0000 mL | INTRAVENOUS | Status: DC | PRN

## 2022-07-21 MED ORDER — SODIUM CHLORIDE 0.9% FLUSH: 3.0000 mL | INTRAVENOUS | Status: DC | PRN

## 2022-07-21 MED ORDER — HEPARIN SODIUM (PORCINE) 1000 UNIT/ML IJ SOLN
INTRAMUSCULAR | Status: DC | PRN
  Administered 2022-07-21: 3000 [IU] via INTRAVENOUS

## 2022-07-21 MED ORDER — SODIUM CHLORIDE 0.9 % IV SOLN: INTRAVENOUS | Status: DC

## 2022-07-21 MED ORDER — SODIUM CHLORIDE 0.9 % WEIGHT BASED INFUSION
3.0000 mL/kg/h | INTRAVENOUS | Status: AC
  Administered 2022-07-21: 3 mL/kg/h via INTRAVENOUS

## 2022-07-21 MED ORDER — VERAPAMIL HCL 2.5 MG/ML IV SOLN
INTRAVENOUS | Status: DC | PRN
  Administered 2022-07-21: 10 mL via INTRA_ARTERIAL

## 2022-07-21 MED ORDER — HEPARIN (PORCINE) IN NACL 1000-0.9 UT/500ML-% IV SOLN
INTRAVENOUS | Status: DC | PRN
  Administered 2022-07-21 (×2): 500 mL

## 2022-07-21 MED ORDER — MIDAZOLAM HCL 2 MG/2ML IJ SOLN
INTRAMUSCULAR | Status: DC | PRN
  Administered 2022-07-21: 1 mg via INTRAVENOUS

## 2022-07-21 MED ORDER — HEPARIN SODIUM (PORCINE) 1000 UNIT/ML IJ SOLN
INTRAMUSCULAR | Status: AC
  Filled 2022-07-21: qty 10

## 2022-07-21 MED ORDER — ACETAMINOPHEN 325 MG PO TABS: 650.0000 mg | ORAL_TABLET | ORAL | Status: DC | PRN

## 2022-07-21 MED ORDER — SODIUM CHLORIDE 0.9 % WEIGHT BASED INFUSION: 1.0000 mL/kg/h | INTRAVENOUS | Status: DC

## 2022-07-21 MED ORDER — FENTANYL CITRATE (PF) 100 MCG/2ML IJ SOLN
INTRAMUSCULAR | Status: DC | PRN
  Administered 2022-07-21: 25 ug via INTRAVENOUS

## 2022-07-21 MED ORDER — ASPIRIN 81 MG PO CHEW: 81.0000 mg | CHEWABLE_TABLET | Freq: Every day | ORAL | Status: DC

## 2022-07-21 MED ORDER — SODIUM CHLORIDE 0.9% FLUSH: 3.0000 mL | Freq: Two times a day (BID) | INTRAVENOUS | Status: DC

## 2022-07-21 MED ORDER — IOHEXOL 350 MG/ML SOLN
INTRAVENOUS | Status: DC | PRN
  Administered 2022-07-21: 85 mL

## 2022-07-21 MED ORDER — VERAPAMIL HCL 2.5 MG/ML IV SOLN
INTRAVENOUS | Status: AC
  Filled 2022-07-21: qty 2

## 2022-07-21 MED ORDER — LABETALOL HCL 5 MG/ML IV SOLN: 10.0000 mg | INTRAVENOUS | Status: DC | PRN

## 2022-07-21 MED ORDER — ONDANSETRON HCL 4 MG/2ML IJ SOLN: 4.0000 mg | Freq: Four times a day (QID) | INTRAMUSCULAR | Status: DC | PRN

## 2022-07-21 MED ORDER — HYDRALAZINE HCL 20 MG/ML IJ SOLN: 10.0000 mg | INTRAMUSCULAR | Status: DC | PRN

## 2022-07-21 MED ORDER — HEPARIN (PORCINE) IN NACL 1000-0.9 UT/500ML-% IV SOLN
INTRAVENOUS | Status: AC
  Filled 2022-07-21: qty 1000

## 2022-07-21 MED ORDER — NITROGLYCERIN 1 MG/10 ML FOR IR/CATH LAB
INTRA_ARTERIAL | Status: DC | PRN
  Administered 2022-07-21: 200 ug via INTRACORONARY

## 2022-07-21 MED ORDER — LIDOCAINE HCL (PF) 1 % IJ SOLN
INTRAMUSCULAR | Status: AC
  Filled 2022-07-21: qty 30

## 2022-07-21 SURGICAL SUPPLY — 11 items
BAND ZEPHYR COMPRESS 30 LONG (HEMOSTASIS) IMPLANT
CATH 5FR JL3.5 JR4 ANG PIG MP (CATHETERS) IMPLANT
DEVICE RAD COMP TR BAND LRG (VASCULAR PRODUCTS) IMPLANT
GLIDESHEATH SLEND A-KIT 6F 22G (SHEATH) IMPLANT
GUIDEWIRE INQWIRE 1.5J.035X260 (WIRE) IMPLANT
INQWIRE 1.5J .035X260CM (WIRE) ×1
KIT HEART LEFT (KITS) ×1 IMPLANT
PACK CARDIAC CATHETERIZATION (CUSTOM PROCEDURE TRAY) ×1 IMPLANT
SHEATH PROBE COVER 6X72 (BAG) IMPLANT
TRANSDUCER W/STOPCOCK (MISCELLANEOUS) ×1 IMPLANT
TUBING CIL FLEX 10 FLL-RA (TUBING) ×1 IMPLANT

## 2022-07-21 NOTE — Progress Notes (Signed)
TR BAND REMOVAL  LOCATION:    right radial  DEFLATED PER PROTOCOL:    Yes.    TIME BAND OFF / DRESSING APPLIED:    1200 gauze dressing applied   SITE UPON ARRIVAL:    Level 0  SITE AFTER BAND REMOVAL:    Level 0  CIRCULATION SENSATION AND MOVEMENT:    Within Normal Limits   Yes.    COMMENTS:   No issues

## 2022-07-21 NOTE — Interval H&P Note (Signed)
Cath Lab Visit (complete for each Cath Lab visit)  Clinical Evaluation Leading to the Procedure:   ACS: No.  Non-ACS:    Anginal Classification: CCS III  Anti-ischemic medical therapy: Maximal Therapy (2 or more classes of medications)  Non-Invasive Test Results: No non-invasive testing performed  Prior CABG: No previous CABG      History and Physical Interval Note:  07/21/2022 8:46 AM  Jasmine Keith  has presented today for surgery, with the diagnosis of angina.  The various methods of treatment have been discussed with the patient and family. After consideration of risks, benefits and other options for treatment, the patient has consented to  Procedure(s): LEFT HEART CATH AND CORONARY ANGIOGRAPHY (N/A) as a surgical intervention.  The patient's history has been reviewed, patient examined, no change in status, stable for surgery.  I have reviewed the patient's chart and labs.  Questions were answered to the patient's satisfaction.     Belva Crome III

## 2022-07-21 NOTE — CV Procedure (Signed)
Diffuse in-stent restenosis of the mid to distal RCA stent ending on the bifurcation.  Relatively small caliber vessel due to diffuse atherosclerosis. 80% LAD just distal to the bifurcation (Medina 010) with a large first diagonal with distal bifurcation and high-grade stenosis in the most medial branch. First obtuse marginal with 75 to 80% stenosis.  Second obtuse marginal 60 to 70%.  High-grade obstruction in left atrial recurrent. Left main is widely patent Aorta has focal calcification near the right coronary and noncoronary cusps.  Recommend consideration of coronary bypass grafting.  There are no ideal targets for PCI.  Most stenoses are add back furcation points.

## 2022-07-21 NOTE — Discharge Instructions (Signed)

## 2022-07-24 ENCOUNTER — Encounter (HOSPITAL_COMMUNITY): Payer: Self-pay | Admitting: Interventional Cardiology

## 2022-07-27 ENCOUNTER — Ambulatory Visit: Payer: Medicare Other

## 2022-07-28 ENCOUNTER — Encounter: Payer: Medicare Other | Admitting: Thoracic Surgery (Cardiothoracic Vascular Surgery)

## 2022-08-02 NOTE — Progress Notes (Signed)
Agree with assessment/plan.  Raj Demondre Aguas, MD [facility] [phone]  

## 2022-08-07 ENCOUNTER — Ambulatory Visit (INDEPENDENT_AMBULATORY_CARE_PROVIDER_SITE_OTHER): Payer: Medicare Other | Admitting: Medical

## 2022-08-07 ENCOUNTER — Encounter: Payer: Self-pay | Admitting: Medical

## 2022-08-07 VITALS — BP 150/60 | HR 97 | Resp 18 | Ht 61.0 in | Wt 107.6 lb

## 2022-08-07 DIAGNOSIS — E1100 Type 2 diabetes mellitus with hyperosmolarity without nonketotic hyperglycemic-hyperosmolar coma (NKHHC): Secondary | ICD-10-CM | POA: Diagnosis not present

## 2022-08-07 DIAGNOSIS — I209 Angina pectoris, unspecified: Secondary | ICD-10-CM

## 2022-08-07 DIAGNOSIS — F17200 Nicotine dependence, unspecified, uncomplicated: Secondary | ICD-10-CM | POA: Diagnosis not present

## 2022-08-07 DIAGNOSIS — E1169 Type 2 diabetes mellitus with other specified complication: Secondary | ICD-10-CM

## 2022-08-07 DIAGNOSIS — N179 Acute kidney failure, unspecified: Secondary | ICD-10-CM

## 2022-08-07 DIAGNOSIS — F419 Anxiety disorder, unspecified: Secondary | ICD-10-CM

## 2022-08-07 DIAGNOSIS — E785 Hyperlipidemia, unspecified: Secondary | ICD-10-CM | POA: Diagnosis not present

## 2022-08-07 DIAGNOSIS — R790 Abnormal level of blood mineral: Secondary | ICD-10-CM
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
## 2022-08-07 NOTE — Patient Instructions (Addendum)
For diabetes continue metformin and low sugar.  Hx of anxiety- if your anxiety worsens let me know. Particularly if heart surgery needed and getting anxious.  Decreased gfr. Check cmp today.  For high cholesterol and CAD get lipid panel, get echo and follow up with cardiothoracic surgeon this Friday.  For low magnesium get magnesium level today.  Do recommend smoking cessation. Offered nicotine patch but declined.  Htn- continue multi-med regimen. Ask you check bp daily and update me on Friday.  Follow up 3 weeks or sooner if needed.

## 2022-08-08 LAB — LIPID PANEL
Cholesterol: 229 mg/dL — ABNORMAL HIGH (ref 0–200)
HDL: 49 mg/dL (ref >39.00)
NonHDL: 179.67
Total CHOL/HDL Ratio: 5
Triglycerides: 307 mg/dL — ABNORMAL HIGH (ref 0.0–149.0)
VLDL: 61.4 mg/dL — ABNORMAL HIGH (ref 0.0–40.0)

## 2022-08-08 LAB — COMPREHENSIVE METABOLIC PANEL
ALT: 10 U/L (ref 0–35)
AST: 16 U/L (ref 0–37)
Albumin: 4.3 g/dL (ref 3.5–5.2)
Alkaline Phosphatase: 41 U/L (ref 39–117)
BUN: 29 mg/dL — ABNORMAL HIGH (ref 6–23)
CO2: 21 mEq/L (ref 19–32)
Calcium: 9.4 mg/dL (ref 8.4–10.5)
Chloride: 105 mEq/L (ref 96–112)
Creatinine, Ser: 1.19 mg/dL (ref 0.40–1.20)
GFR: 46.56 mL/min — ABNORMAL LOW (ref >60.00)
Glucose, Bld: 141 mg/dL — ABNORMAL HIGH (ref 70–99)
Potassium: 4.6 mEq/L (ref 3.5–5.1)
Sodium: 137 mEq/L (ref 135–145)
Total Bilirubin: 0.2 mg/dL (ref 0.2–1.2)
Total Protein: 6.8 g/dL (ref 6.0–8.3)

## 2022-08-08 LAB — LDL CHOLESTEROL, DIRECT: Direct LDL: 154 mg/dL

## 2022-08-08 LAB — MAGNESIUM: Magnesium: 1.2 mg/dL — ABNORMAL LOW (ref 1.5–2.5)
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
## 2022-08-08 NOTE — Addendum Note (Signed)
Addended by: Anabel Halon on: 08/08/2022 07:53 PM   Modules accepted: Orders

## 2022-08-10 ENCOUNTER — Other Ambulatory Visit: Payer: Self-pay

## 2022-08-10 ENCOUNTER — Ambulatory Visit: Payer: Medicare Other

## 2022-08-11 ENCOUNTER — Encounter: Payer: Medicare Other | Admitting: Thoracic Surgery (Cardiothoracic Vascular Surgery)

## 2022-08-12 ENCOUNTER — Other Ambulatory Visit: Payer: Self-pay | Admitting: Medical

## 2022-08-13 ENCOUNTER — Other Ambulatory Visit: Payer: Self-pay | Admitting: Medical

## 2022-08-14 ENCOUNTER — Ambulatory Visit: Payer: Medicare Other | Admitting: Cardiology

## 2022-08-16 ENCOUNTER — Other Ambulatory Visit: Payer: Medicare Other

## 2022-08-16 DIAGNOSIS — R790 Abnormal level of blood mineral: Secondary | ICD-10-CM

## 2022-08-16 NOTE — Addendum Note (Signed)
Addended by: Kelle Darting A on: 08/16/2022 02:31 PM   Modules accepted: Orders

## 2022-08-16 NOTE — Progress Notes (Signed)
Pt dehydrated.  Will hydrate and return Friday for repeat lab draw.

## 2022-08-17 ENCOUNTER — Ambulatory Visit: Payer: Medicare Other | Attending: Cardiology

## 2022-08-17 DIAGNOSIS — I209 Angina pectoris, unspecified: Secondary | ICD-10-CM | POA: Insufficient documentation

## 2022-08-17 DIAGNOSIS — I2510 Atherosclerotic heart disease of native coronary artery without angina pectoris: Secondary | ICD-10-CM | POA: Diagnosis present

## 2022-08-17 LAB — ECHOCARDIOGRAM COMPLETE
Area-P 1/2: 2.82 cm2
S' Lateral: 2.5 cm

## 2022-08-18 ENCOUNTER — Other Ambulatory Visit (INDEPENDENT_AMBULATORY_CARE_PROVIDER_SITE_OTHER): Payer: Medicare Other

## 2022-08-18 DIAGNOSIS — R790 Abnormal level of blood mineral: Secondary | ICD-10-CM

## 2022-08-18 LAB — MAGNESIUM: Magnesium: 1.8 mg/dL (ref 1.5–2.5)

## 2022-08-18 NOTE — Progress Notes (Signed)
Pt here for redraw. No charge today.  

## 2022-08-20 ENCOUNTER — Other Ambulatory Visit: Payer: Self-pay | Admitting: Medical

## 2022-08-21 ENCOUNTER — Encounter: Payer: Self-pay | Admitting: Cardiology

## 2022-08-21 ENCOUNTER — Ambulatory Visit: Payer: Medicare Other | Attending: Cardiology | Admitting: Cardiology

## 2022-08-21 ENCOUNTER — Other Ambulatory Visit: Payer: Self-pay | Admitting: Cardiology

## 2022-08-21 VITALS — BP 156/54 | HR 96 | Ht 61.0 in | Wt 108.0 lb

## 2022-08-21 DIAGNOSIS — E088 Diabetes mellitus due to underlying condition with unspecified complications: Secondary | ICD-10-CM

## 2022-08-21 DIAGNOSIS — I2510 Atherosclerotic heart disease of native coronary artery without angina pectoris: Secondary | ICD-10-CM

## 2022-08-21 DIAGNOSIS — F17210 Nicotine dependence, cigarettes, uncomplicated: Secondary | ICD-10-CM | POA: Diagnosis present

## 2022-08-21 DIAGNOSIS — E782 Mixed hyperlipidemia: Secondary | ICD-10-CM

## 2022-08-21 DIAGNOSIS — I10 Essential (primary) hypertension: Secondary | ICD-10-CM | POA: Diagnosis present

## 2022-08-21 MED ORDER — LIVALO 2 MG PO TABS: 2.0000 mg | ORAL_TABLET | Freq: Every day | ORAL | 12 refills | Status: DC

## 2022-08-21 NOTE — Patient Instructions (Signed)
Medication Instructions:  Your physician has recommended you make the following change in your medication:   Start Livalo 2 mg daily.  *If you need a refill on your cardiac medications before your next appointment, please call your pharmacy*   Lab Work: Your physician recommends that you return for lab work in: 6 weeks You need to have labs done when you are fasting.  You can come Monday through Friday 8:30 am to 12:00 pm and 1:15 to 4:30. You do not need to make an appointment as the order has already been placed. The labs you are going to have done are BMET,  LFT and Lipids.  If you have labs (blood work) drawn today and your tests are completely normal, you will receive your results only by: [place] (if you have MyChart) OR A paper copy in the mail If you have any lab test that is abnormal or we need to change your treatment, we will call you to review the results.   Testing/Procedures: None ordered   Follow-Up: At [facility], you and your health needs are our priority.  As part of our continuing mission to provide you with exceptional heart care, we have created designated Provider Care Teams.  These Care Teams include your primary Cardiologist (physician) and Advanced Practice Providers (APPs -  Physician Assistants and Nurse Practitioners) who all work together to provide you with the care you need, when you need it.  We recommend signing up for the patient portal called "MyChart".  Sign up information is provided on this After Visit Summary.  MyChart is used to connect with patients for Virtual Visits (Telemedicine).  Patients are able to view lab/test results, encounter notes, upcoming appointments, etc.  Non-urgent messages can be sent to your provider as well.   To learn more about what you can do with MyChart, go to [email or web address].    Your next appointment:   1 month(s)  The format for your next appointment:   In Person  Provider:   Jyl Heinz,  MD   Other Instructions NA
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
## 2022-08-21 NOTE — Telephone Encounter (Signed)
PA submitted for Livalo 2 mg on CMM. Key [id number]. PA Approved through 08/21/23.
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
## 2022-08-25 ENCOUNTER — Other Ambulatory Visit: Payer: Self-pay | Admitting: *Deleted

## 2022-08-25 ENCOUNTER — Institutional Professional Consult (permissible substitution) (INDEPENDENT_AMBULATORY_CARE_PROVIDER_SITE_OTHER): Payer: Medicare Other | Admitting: Thoracic Surgery (Cardiothoracic Vascular Surgery)

## 2022-08-25 ENCOUNTER — Encounter: Payer: Self-pay | Admitting: *Deleted

## 2022-08-25 VITALS — BP 167/54 | HR 77 | Resp 20 | Ht 61.0 in | Wt 108.0 lb

## 2022-08-25 DIAGNOSIS — I2510 Atherosclerotic heart disease of native coronary artery without angina pectoris: Secondary | ICD-10-CM

## 2022-08-25 DIAGNOSIS — Z5339 Other specified procedure converted to open procedure: Secondary | ICD-10-CM

## 2022-08-25 DIAGNOSIS — R7309 Other abnormal glucose: Secondary | ICD-10-CM

## 2022-08-25 DIAGNOSIS — R9439 Abnormal result of other cardiovascular function study: Secondary | ICD-10-CM

## 2022-08-28 ENCOUNTER — Ambulatory Visit: Payer: Medicare Other | Admitting: Medical

## 2022-08-31 NOTE — Progress Notes (Signed)
Surgical Instructions    Your procedure is scheduled on Monday October 30th.  Report to [facility] Main Entrance "A" at 5:30 A.M., then check in with the Admitting office.  Call this number if you have problems the morning of surgery:  [phone]   If you have any questions prior to your surgery date call [phone]: Open Monday-Friday 8am-4pm If you experience any cold or flu symptoms such as cough, fever, chills, shortness of breath, etc. between now and your scheduled surgery, please notify us at the above number     Remember:  Do not eat or drink after midnight the night before your surgery     Take these medicines the morning of surgery with A SIP OF WATER: amLODipine (NORVASC) 10 MG tablet hydrALAZINE (APRESOLINE) 25 MG tablet busPIRone (BUSPAR) 7.5 MG tablet fenofibrate 160 MG tablet gabapentin (NEURONTIN) 800 MG tablet levothyroxine (SYNTHROID) 25 MCG tablet metoprolol succinate (TOPROL-XL) 50 MG 24 hr tablet pantoprazole (PROTONIX) 40 MG tablet venlafaxine XR (EFFEXOR-XR) 75 MG 24 hr capsule   IF NEEDED  acetaminophen (TYLENOL) 325 MG tablet meclizine (ANTIVERT) 25 MG tablet nitroGLYCERIN (NITROSTAT) 0.4 MG SL tablet  Follow your surgeon's instructions on when to stop Aspirin.  If no instructions were given by your surgeon then you will need to call the office to get those instructions.    As of today, STOP taking any Aspirin (unless otherwise instructed by your surgeon) Aleve, Naproxen, Ibuprofen, Motrin, Advil, Goody's, BC's, all herbal medications, fish oil, and all vitamins.  WHAT DO I DO ABOUT MY DIABETES MEDICATION?   Do not take oral diabetes medicines (metFORMIN (GLUCOPHAGE) the morning of surgery.  THE DAY BEFORE SURGERY,  Take 100% of your morning dose of Levemir  Take 50% of your evening dose of Levemir.        THE MORNING OF SURGERY, take 50% of your Levemir dose.   The day of surgery, do not take other diabetes injectables, including Byetta  (exenatide), Bydureon (exenatide ER), Victoza (liraglutide), or Trulicity (dulaglutide).  If your CBG is greater than 220 mg/dL, you may take  of your sliding scale (correction) dose of insulin.   HOW TO MANAGE YOUR DIABETES BEFORE AND AFTER SURGERY  Why is it important to control my blood sugar before and after surgery? Improving blood sugar levels before and after surgery helps healing and can limit problems. A way of improving blood sugar control is eating a healthy diet by:  Eating less sugar and carbohydrates  Increasing activity/exercise  Talking with your doctor about reaching your blood sugar goals High blood sugars (greater than 180 mg/dL) can raise your risk of infections and slow your recovery, so you will need to focus on controlling your diabetes during the weeks before surgery. Make sure that the doctor who takes care of your diabetes knows about your planned surgery including the date and location.  How do I manage my blood sugar before surgery? Check your blood sugar at least 4 times a day, starting 2 days before surgery, to make sure that the level is not too high or low.  Check your blood sugar the morning of your surgery when you wake up and every 2 hours until you get to the Short Stay unit.  If your blood sugar is less than 70 mg/dL, you will need to treat for low blood sugar: Do not take insulin. Treat a low blood sugar (less than 70 mg/dL) with  cup of clear juice (cranberry or apple), 4 glucose tablets, OR  glucose gel. Recheck blood sugar in 15 minutes after treatment (to make sure it is greater than 70 mg/dL). If your blood sugar is not greater than 70 mg/dL on recheck, call [phone] for further instructions. Report your blood sugar to the short stay nurse when you get to Short Stay.  If you are admitted to the hospital after surgery: Your blood sugar will be checked by the staff and you will probably be given insulin after surgery (instead of oral diabetes  medicines) to make sure you have good blood sugar levels. The goal for blood sugar control after surgery is 80-180 mg/dL.        Do not wear jewelry or makeup. Do not wear lotions, powders, perfumes or deodorant. Do not shave 48 hours prior to surgery.   Do not bring valuables to the hospital. Do not wear nail polish, gel polish, artificial nails, or any other type of covering on natural nails (fingers and toes) If you have artificial nails or gel coating that need to be removed by a nail salon, please have this removed prior to surgery. Artificial nails or gel coating may interfere with anesthesia's ability to adequately monitor your vital signs.  [facility] is not responsible for any belongings or valuables.    Do NOT Smoke (Tobacco/Vaping)  24 hours prior to your procedure  If you use a CPAP at night, you may bring your mask for your overnight stay.   Contacts, glasses, hearing aids, dentures or partials may not be worn into surgery, please bring cases for these belongings   For patients admitted to the hospital, discharge time will be determined by your treatment team.   Patients discharged the day of surgery will not be allowed to drive home, and someone needs to stay with them for 24 hours.   SURGICAL WAITING ROOM VISITATION Patients having surgery or a procedure may have no more than 2 support people in the waiting area - these visitors may rotate.   Children under the age of 38 must have an adult with them who is not the patient. If the patient needs to stay at the hospital during part of their recovery, the visitor guidelines for inpatient rooms apply. Pre-op nurse will coordinate an appropriate time for 1 support person to accompany patient in pre-op.  This support person may not rotate.   Please refer to [email or web address] for the visitor guidelines for Inpatients (after your surgery is over and you are in a regular room).     Special instructions:    Oral Hygiene is also important to reduce your risk of infection.  Remember - BRUSH YOUR TEETH THE MORNING OF SURGERY WITH YOUR REGULAR TOOTHPASTE   [facility]- Preparing For Surgery  Before surgery, you can play an important role. Because skin is not sterile, your skin needs to be as free of germs as possible. You can reduce the number of germs on your skin by washing with CHG (chlorahexidine gluconate) Soap before surgery.  CHG is an antiseptic cleaner which kills germs and bonds with the skin to continue killing germs even after washing.     Please do not use if you have an allergy to CHG or antibacterial soaps. If your skin becomes reddened/irritated stop using the CHG.  Do not shave (including legs and underarms) for at least 48 hours prior to first CHG shower. It is OK to shave your face.  Please follow these instructions carefully.     Shower the [employer] SURGERY and the  MORNING OF SURGERY with CHG Soap.   If you chose to wash your hair, wash your hair first as usual with your normal shampoo. After you shampoo, rinse your hair and body thoroughly to remove the shampoo.  Then [employer] and genitals (private parts) with your normal soap and rinse thoroughly to remove soap.  After that Use CHG Soap as you would any other liquid soap. You can apply CHG directly to the skin and wash gently with a scrungie or a clean washcloth.   Apply the CHG Soap to your body ONLY FROM THE NECK DOWN.  Do not use on open wounds or open sores. Avoid contact with your eyes, ears, mouth and genitals (private parts). Wash Face and genitals (private parts)  with your normal soap.   Wash thoroughly, paying special attention to the area where your surgery will be performed.  Thoroughly rinse your body with warm water from the neck down.  DO NOT shower/wash with your normal soap after using and rinsing off the CHG Soap.  Pat yourself dry with a CLEAN TOWEL.  Wear CLEAN  PAJAMAS to bed the night before surgery  Place CLEAN SHEETS on your bed the night before your surgery  DO NOT SLEEP WITH PETS.   Day of Surgery:  Take a shower with CHG soap. Wear Clean/Comfortable clothing the morning of surgery Do not apply any deodorants/lotions.   Remember to brush your teeth WITH YOUR REGULAR TOOTHPASTE.    If you received a COVID test during your pre-op visit, it is requested that you wear a mask when out in public, stay away from anyone that may not be feeling well, and notify your surgeon if you develop symptoms. If you have been in contact with anyone that has tested positive in the last 10 days, please notify your surgeon.    Please read over the following fact sheets that you were given.

## 2022-09-01 ENCOUNTER — Encounter (HOSPITAL_COMMUNITY)
Admission: RE | Admit: 2022-09-01 | Discharge: 2022-09-01 | Disposition: A | Discharge status: Home / Self Care | Payer: Medicare Other | Source: Ambulatory Visit | Attending: Thoracic Surgery (Cardiothoracic Vascular Surgery) | Admitting: Thoracic Surgery (Cardiothoracic Vascular Surgery)

## 2022-09-01 ENCOUNTER — Encounter (HOSPITAL_COMMUNITY): Payer: Self-pay

## 2022-09-01 ENCOUNTER — Other Ambulatory Visit: Payer: Self-pay

## 2022-09-01 ENCOUNTER — Ambulatory Visit (HOSPITAL_BASED_OUTPATIENT_CLINIC_OR_DEPARTMENT_OTHER)
Admission: RE | Admit: 2022-09-01 | Discharge: 2022-09-01 | Disposition: A | Discharge status: Home / Self Care | Payer: Medicare Other | Source: Ambulatory Visit | Attending: Thoracic Surgery (Cardiothoracic Vascular Surgery) | Admitting: Thoracic Surgery (Cardiothoracic Vascular Surgery)

## 2022-09-01 ENCOUNTER — Ambulatory Visit (HOSPITAL_COMMUNITY)
Admission: RE | Admit: 2022-09-01 | Discharge: 2022-09-01 | Disposition: A | Discharge status: Home / Self Care | Payer: Medicare Other | Source: Ambulatory Visit | Attending: Thoracic Surgery (Cardiothoracic Vascular Surgery) | Admitting: Thoracic Surgery (Cardiothoracic Vascular Surgery)

## 2022-09-01 VITALS — BP 153/41 | HR 78 | Temp 97.8°F | Resp 20 | Ht 61.0 in | Wt 109.3 lb

## 2022-09-01 DIAGNOSIS — Z1152 Encounter for screening for COVID-19: Secondary | ICD-10-CM | POA: Insufficient documentation

## 2022-09-01 DIAGNOSIS — Z87820 Personal history of traumatic brain injury: Secondary | ICD-10-CM | POA: Insufficient documentation

## 2022-09-01 DIAGNOSIS — Z5339 Other specified procedure converted to open procedure: Secondary | ICD-10-CM | POA: Insufficient documentation

## 2022-09-01 DIAGNOSIS — I779 Disorder of arteries and arterioles, unspecified: Secondary | ICD-10-CM | POA: Insufficient documentation

## 2022-09-01 DIAGNOSIS — E785 Hyperlipidemia, unspecified: Secondary | ICD-10-CM | POA: Insufficient documentation

## 2022-09-01 DIAGNOSIS — F17200 Nicotine dependence, unspecified, uncomplicated: Secondary | ICD-10-CM | POA: Insufficient documentation

## 2022-09-01 DIAGNOSIS — I2510 Atherosclerotic heart disease of native coronary artery without angina pectoris: Secondary | ICD-10-CM

## 2022-09-01 DIAGNOSIS — I10 Essential (primary) hypertension: Secondary | ICD-10-CM | POA: Insufficient documentation

## 2022-09-01 DIAGNOSIS — R7309 Other abnormal glucose: Secondary | ICD-10-CM

## 2022-09-01 DIAGNOSIS — Z01818 Encounter for other preprocedural examination: Secondary | ICD-10-CM

## 2022-09-01 DIAGNOSIS — E119 Type 2 diabetes mellitus without complications: Secondary | ICD-10-CM | POA: Insufficient documentation

## 2022-09-01 HISTORY — DX: Reserved for concepts with insufficient information to code with codable children: IMO0002

## 2022-09-01 HISTORY — DX: Depression, unspecified: F32.A

## 2022-09-01 HISTORY — DX: Systemic lupus erythematosus, unspecified: M32.9

## 2022-09-01 LAB — COMPREHENSIVE METABOLIC PANEL
ALT: 15 U/L (ref 0–44)
AST: 22 U/L (ref 15–41)
Albumin: 4 g/dL (ref 3.5–5.0)
Alkaline Phosphatase: 35 U/L — ABNORMAL LOW (ref 38–126)
Anion gap: 10 (ref 5–15)
BUN: 28 mg/dL — ABNORMAL HIGH (ref 8–23)
CO2: 17 mmol/L — ABNORMAL LOW (ref 22–32)
Calcium: 9.6 mg/dL (ref 8.9–10.3)
Chloride: 107 mmol/L (ref 98–111)
Creatinine, Ser: 1.24 mg/dL — ABNORMAL HIGH (ref 0.44–1.00)
GFR, Estimated: 47 mL/min — ABNORMAL LOW (ref >60)
Glucose, Bld: 142 mg/dL — ABNORMAL HIGH (ref 70–99)
Potassium: 4 mmol/L (ref 3.5–5.1)
Sodium: 134 mmol/L — ABNORMAL LOW (ref 135–145)
Total Bilirubin: 0.3 mg/dL (ref 0.3–1.2)
Total Protein: 6.8 g/dL (ref 6.5–8.1)

## 2022-09-01 LAB — HEMOGLOBIN A1C
Hgb A1c MFr Bld: 6.9 % — ABNORMAL HIGH (ref 4.8–5.6)
Mean Plasma Glucose: 151.33 mg/dL

## 2022-09-01 LAB — URINALYSIS, ROUTINE W REFLEX MICROSCOPIC
Bilirubin Urine: NEGATIVE
Glucose, UA: NEGATIVE mg/dL
Hgb urine dipstick: NEGATIVE
Ketones, ur: NEGATIVE mg/dL
Leukocytes,Ua: NEGATIVE
Nitrite: NEGATIVE
Protein, ur: 100 mg/dL — AB
Specific Gravity, Urine: 1.01 (ref 1.005–1.030)
pH: 5 (ref 5.0–8.0)

## 2022-09-01 LAB — BLOOD GAS, ARTERIAL
Acid-base deficit: 5.5 mmol/L — ABNORMAL HIGH (ref 0.0–2.0)
Bicarbonate: 18 mmol/L — ABNORMAL LOW (ref 20.0–28.0)
Drawn by: 58793
O2 Saturation: 99.7 %
Patient temperature: 37
pCO2 arterial: 29 mmHg — ABNORMAL LOW (ref 32–48)
pH, Arterial: 7.4 (ref 7.35–7.45)
pO2, Arterial: 127 mmHg — ABNORMAL HIGH (ref 83–108)

## 2022-09-01 LAB — CBC
HCT: 28.9 % — ABNORMAL LOW (ref 36.0–46.0)
Hemoglobin: 9.6 g/dL — ABNORMAL LOW (ref 12.0–15.0)
MCH: 26.6 pg (ref 26.0–34.0)
MCHC: 33.2 g/dL (ref 30.0–36.0)
MCV: 80.1 fL (ref 80.0–100.0)
Platelets: 398 10*3/uL (ref 150–400)
RBC: 3.61 MIL/uL — ABNORMAL LOW (ref 3.87–5.11)
RDW: 14.7 % (ref 11.5–15.5)
WBC: 9.1 10*3/uL (ref 4.0–10.5)
nRBC: 0 % (ref 0.0–0.2)

## 2022-09-01 LAB — APTT: aPTT: 26 seconds (ref 24–36)

## 2022-09-01 LAB — SURGICAL PCR SCREEN
MRSA, PCR: NEGATIVE
Staphylococcus aureus: NEGATIVE

## 2022-09-01 LAB — PROTIME-INR
INR: 1 (ref 0.8–1.2)
Prothrombin Time: 13.1 seconds (ref 11.4–15.2)

## 2022-09-01 LAB — GLUCOSE, CAPILLARY: Glucose-Capillary: 197 mg/dL — ABNORMAL HIGH (ref 70–99)

## 2022-09-01 MED ORDER — EPINEPHRINE HCL 5 MG/250ML IV SOLN IN NS
0.0000 ug/min | INTRAVENOUS | Status: DC
  Filled 2022-09-01: qty 250

## 2022-09-01 MED ORDER — NOREPINEPHRINE 4 MG/250ML-% IV SOLN
0.0000 ug/min | INTRAVENOUS | Status: DC
  Filled 2022-09-01: qty 250

## 2022-09-01 MED ORDER — CEFAZOLIN SODIUM-DEXTROSE 2-4 GM/100ML-% IV SOLN
2.0000 g | INTRAVENOUS | Status: AC
  Administered 2022-09-04: 2 g via INTRAVENOUS
  Filled 2022-09-01: qty 100

## 2022-09-01 MED ORDER — HEPARIN 30,000 UNITS/1000 ML (OHS) CELLSAVER SOLUTION
Status: DC
  Filled 2022-09-01: qty 1000

## 2022-09-01 MED ORDER — VANCOMYCIN HCL IN DEXTROSE 1-5 GM/200ML-% IV SOLN
1000.0000 mg | INTRAVENOUS | Status: AC
  Administered 2022-09-04: 1000 mg via INTRAVENOUS
  Filled 2022-09-01: qty 200

## 2022-09-01 MED ORDER — PLASMA-LYTE A IV SOLN
INTRAVENOUS | Status: DC
  Filled 2022-09-01: qty 2.5

## 2022-09-01 MED ORDER — TRANEXAMIC ACID (OHS) PUMP PRIME SOLUTION
2.0000 mg/kg | INTRAVENOUS | Status: DC
  Filled 2022-09-01: qty 0.98

## 2022-09-01 MED ORDER — DEXMEDETOMIDINE HCL IN NACL 400 MCG/100ML IV SOLN
0.1000 ug/kg/h | INTRAVENOUS | Status: AC
  Administered 2022-09-04: .5 ug/kg/h via INTRAVENOUS
  Filled 2022-09-01: qty 100

## 2022-09-01 MED ORDER — TRANEXAMIC ACID (OHS) BOLUS VIA INFUSION
15.0000 mg/kg | INTRAVENOUS | Status: AC
  Administered 2022-09-04: 735 mg via INTRAVENOUS
  Filled 2022-09-01: qty 735

## 2022-09-01 MED ORDER — MILRINONE LACTATE IN DEXTROSE 20-5 MG/100ML-% IV SOLN
0.3000 ug/kg/min | INTRAVENOUS | Status: DC
  Filled 2022-09-01: qty 100

## 2022-09-01 MED ORDER — POTASSIUM CHLORIDE 2 MEQ/ML IV SOLN
80.0000 meq | INTRAVENOUS | Status: DC
  Filled 2022-09-01: qty 40

## 2022-09-01 MED ORDER — INSULIN REGULAR(HUMAN) IN NACL 100-0.9 UT/100ML-% IV SOLN
INTRAVENOUS | Status: AC
  Administered 2022-09-04: 1.3 [IU]/h via INTRAVENOUS
  Filled 2022-09-01: qty 100

## 2022-09-01 MED ORDER — MANNITOL 20 % IV SOLN
INTRAVENOUS | Status: DC
  Filled 2022-09-01: qty 13

## 2022-09-01 MED ORDER — PHENYLEPHRINE HCL-NACL 20-0.9 MG/250ML-% IV SOLN
30.0000 ug/min | INTRAVENOUS | Status: AC
  Administered 2022-09-04: 20 ug/min via INTRAVENOUS
  Filled 2022-09-01: qty 250

## 2022-09-01 MED ORDER — TRANEXAMIC ACID 1000 MG/10ML IV SOLN
1.5000 mg/kg/h | INTRAVENOUS | Status: AC
  Administered 2022-09-04: 1.5 mg/kg/h via INTRAVENOUS
  Filled 2022-09-01: qty 25

## 2022-09-01 NOTE — Progress Notes (Signed)
PCP - Mackie Pai, MD Cardiologist - Jyl Heinz, MD  PPM/ICD - Denies Device Orders -  Rep Notified -   Chest x-ray - 09/01/22 EKG - 09/01/22 Stress Test - Years ago ECHO - 08/25/22 Cardiac Cath - 07/21/22  Sleep Study - Denies  DM - Type II CBG @ PAT appt 197 Ate last 1130 Fasting Blood Sugar - 120-140 Checks Blood Sugar _daily  Last dose of GLP1 agonist-  Denies  Blood Thinner Instructions:Denies Aspirin Instructions: Spoke with Alyssa with Dr. Abran Duke group patient will take Aspirin up until the day before surgery but none the day of.  ERAS Protcol -No   COVID TEST- 09/01/22   Anesthesia review: yes cardiac history  Patient denies shortness of breath (no more than normal), fever, cough and chest pain at PAT appointment   All instructions explained to the patient, with a verbal understanding of the material. Patient agrees to go over the instructions while at home for a better understanding. Patient also instructed to wear a mask while in public after being tested for COVID-19. The opportunity to ask questions was provided.

## 2022-09-02 LAB — SARS CORONAVIRUS 2 (TAT 6-24 HRS): SARS Coronavirus 2: NEGATIVE

## 2022-09-03 NOTE — Anesthesia Preprocedure Evaluation (Signed)
Anesthesia Evaluation  Patient identified by MRN, date of birth, ID band Patient awake    Reviewed: Allergy & Precautions, NPO status , Patient's Chart, lab work & pertinent test results  Airway Mallampati: II  TM Distance: >3 FB Neck ROM: Full    Dental  (+) Teeth Intact, Dental Advisory Given, Caps,    Pulmonary Current Smoker and Patient abstained from smoking.   Pulmonary exam normal breath sounds clear to auscultation       Cardiovascular hypertension, Pt. on medications and Pt. on home beta blockers + angina  + CAD, + Cardiac Stents and + Peripheral Vascular Disease (AAA)  Normal cardiovascular exam Rhythm:Regular Rate:Normal  Echo 08/17/22: 1. Left ventricular ejection fraction, by estimation, is 70 to 75%. The left ventricle has hyperdynamic function. The left ventricle has no regional wall motion abnormalities. There is mild concentric left ventricular hypertrophy. Left ventricular diastolic parameters are consistent with Grade I diastolic dysfunction (impaired relaxation). The average left ventricular global longitudinal strain is -16.5 %.  2. Right ventricular systolic function is normal. The right ventricular size is normal.  3. The mitral valve is degenerative. No evidence of mitral valve regurgitation. No evidence of mitral stenosis.  4. The aortic valve is tricuspid. Aortic valve regurgitation is not visualized. Aortic valve sclerosis is present, with no evidence of aortic valve stenosis.  5. Aortic Normal DTA.  6. The inferior vena cava is normal in size with greater than 50% respiratory variability, suggesting right atrial pressure of 3 mmHg.    Neuro/Psych  PSYCHIATRIC DISORDERS Anxiety Depression    negative neurological ROS     GI/Hepatic Neg liver ROS,GERD  Medicated,,  Endo/Other  diabetes, Type 2, Oral Hypoglycemic Agents, Insulin DependentHypothyroidism  Lupus  Renal/GU negative Renal ROS      Musculoskeletal  (+)  Fibromyalgia -  Abdominal   Peds  Hematology  (+) Blood dyscrasia, anemia   Anesthesia Other Findings   Reproductive/Obstetrics                             Anesthesia Physical Anesthesia Plan  ASA: 4  Anesthesia Plan: General   Post-op Pain Management:    Induction: Intravenous  PONV Risk Score and Plan: 2 and Treatment may vary due to age or medical condition and Midazolam  Airway Management Planned: Oral ETT  Additional Equipment: Arterial line, CVP, TEE and Ultrasound Guidance Line Placement  Intra-op Plan:   Post-operative Plan: Post-operative intubation/ventilation  Informed Consent: I have reviewed the patients History and Physical, chart, labs and discussed the procedure including the risks, benefits and alternatives for the proposed anesthesia with the patient or authorized representative who has indicated his/her understanding and acceptance.     Dental advisory given  Plan Discussed with: CRNA  Anesthesia Plan Comments: (FLOW TRAC)        Anesthesia Quick Evaluation

## 2022-09-04 ENCOUNTER — Other Ambulatory Visit: Payer: Self-pay

## 2022-09-04 ENCOUNTER — Inpatient Hospital Stay (HOSPITAL_COMMUNITY): Payer: Medicare Other | Admitting: Vascular Surgery

## 2022-09-04 ENCOUNTER — Inpatient Hospital Stay (HOSPITAL_COMMUNITY): Payer: Medicare Other

## 2022-09-04 ENCOUNTER — Encounter (HOSPITAL_COMMUNITY): Payer: Self-pay | Admitting: Thoracic Surgery (Cardiothoracic Vascular Surgery)

## 2022-09-04 ENCOUNTER — Inpatient Hospital Stay (HOSPITAL_COMMUNITY)
Admission: RE | Admit: 2022-09-04 | Discharge: 2022-09-17 | DRG: 235 | Disposition: A | Discharge status: Home / Self Care | Payer: Medicare Other | Attending: Thoracic Surgery (Cardiothoracic Vascular Surgery) | Admitting: Thoracic Surgery (Cardiothoracic Vascular Surgery)

## 2022-09-04 ENCOUNTER — Inpatient Hospital Stay (HOSPITAL_COMMUNITY)
Admission: RE | Disposition: A | Discharge status: Home / Self Care | Payer: Self-pay | Source: Home / Self Care | Attending: Thoracic Surgery (Cardiothoracic Vascular Surgery)

## 2022-09-04 DIAGNOSIS — E11649 Type 2 diabetes mellitus with hypoglycemia without coma: Secondary | ICD-10-CM | POA: Diagnosis not present

## 2022-09-04 DIAGNOSIS — Z794 Long term (current) use of insulin: Secondary | ICD-10-CM

## 2022-09-04 DIAGNOSIS — I25119 Atherosclerotic heart disease of native coronary artery with unspecified angina pectoris: Secondary | ICD-10-CM | POA: Diagnosis not present

## 2022-09-04 DIAGNOSIS — F17210 Nicotine dependence, cigarettes, uncomplicated: Secondary | ICD-10-CM

## 2022-09-04 DIAGNOSIS — M810 Age-related osteoporosis without current pathological fracture: Secondary | ICD-10-CM | POA: Diagnosis present

## 2022-09-04 DIAGNOSIS — T82855A Stenosis of coronary artery stent, initial encounter: Secondary | ICD-10-CM | POA: Diagnosis not present

## 2022-09-04 DIAGNOSIS — Z5339 Other specified procedure converted to open procedure: Secondary | ICD-10-CM

## 2022-09-04 DIAGNOSIS — Z79890 Hormone replacement therapy (postmenopausal): Secondary | ICD-10-CM | POA: Diagnosis not present

## 2022-09-04 DIAGNOSIS — Z7983 Long term (current) use of bisphosphonates: Secondary | ICD-10-CM

## 2022-09-04 DIAGNOSIS — Z789 Other specified health status: Secondary | ICD-10-CM | POA: Diagnosis not present

## 2022-09-04 DIAGNOSIS — Z801 Family history of malignant neoplasm of trachea, bronchus and lung: Secondary | ICD-10-CM

## 2022-09-04 DIAGNOSIS — F418 Other specified anxiety disorders: Secondary | ICD-10-CM | POA: Diagnosis not present

## 2022-09-04 DIAGNOSIS — R0689 Other abnormalities of breathing: Secondary | ICD-10-CM | POA: Diagnosis not present

## 2022-09-04 DIAGNOSIS — Z1152 Encounter for screening for COVID-19: Secondary | ICD-10-CM | POA: Diagnosis not present

## 2022-09-04 DIAGNOSIS — Z79899 Other long term (current) drug therapy: Secondary | ICD-10-CM

## 2022-09-04 DIAGNOSIS — I10 Essential (primary) hypertension: Secondary | ICD-10-CM

## 2022-09-04 DIAGNOSIS — Y831 Surgical operation with implant of artificial internal device as the cause of abnormal reaction of the patient, or of later complication, without mention of misadventure at the time of the procedure: Secondary | ICD-10-CM | POA: Diagnosis present

## 2022-09-04 DIAGNOSIS — E874 Mixed disorder of acid-base balance: Secondary | ICD-10-CM | POA: Diagnosis present

## 2022-09-04 DIAGNOSIS — E875 Hyperkalemia: Secondary | ICD-10-CM | POA: Diagnosis not present

## 2022-09-04 DIAGNOSIS — Z7984 Long term (current) use of oral hypoglycemic drugs: Secondary | ICD-10-CM | POA: Diagnosis not present

## 2022-09-04 DIAGNOSIS — I714 Abdominal aortic aneurysm, without rupture, unspecified: Secondary | ICD-10-CM | POA: Diagnosis present

## 2022-09-04 DIAGNOSIS — E872 Acidosis, unspecified: Secondary | ICD-10-CM | POA: Diagnosis not present

## 2022-09-04 DIAGNOSIS — F05 Delirium due to known physiological condition: Secondary | ICD-10-CM | POA: Diagnosis not present

## 2022-09-04 DIAGNOSIS — J9589 Other postprocedural complications and disorders of respiratory system, not elsewhere classified: Secondary | ICD-10-CM | POA: Diagnosis not present

## 2022-09-04 DIAGNOSIS — N179 Acute kidney failure, unspecified: Secondary | ICD-10-CM | POA: Diagnosis not present

## 2022-09-04 DIAGNOSIS — E785 Hyperlipidemia, unspecified: Secondary | ICD-10-CM | POA: Diagnosis present

## 2022-09-04 DIAGNOSIS — Z951 Presence of aortocoronary bypass graft: Principal | ICD-10-CM

## 2022-09-04 DIAGNOSIS — N170 Acute kidney failure with tubular necrosis: Secondary | ICD-10-CM | POA: Diagnosis not present

## 2022-09-04 DIAGNOSIS — I2510 Atherosclerotic heart disease of native coronary artery without angina pectoris: Principal | ICD-10-CM | POA: Diagnosis present

## 2022-09-04 DIAGNOSIS — D62 Acute posthemorrhagic anemia: Secondary | ICD-10-CM | POA: Diagnosis not present

## 2022-09-04 DIAGNOSIS — T508X5A Adverse effect of diagnostic agents, initial encounter: Secondary | ICD-10-CM | POA: Diagnosis not present

## 2022-09-04 DIAGNOSIS — I25810 Atherosclerosis of coronary artery bypass graft(s) without angina pectoris: Secondary | ICD-10-CM | POA: Diagnosis not present

## 2022-09-04 DIAGNOSIS — E039 Hypothyroidism, unspecified: Secondary | ICD-10-CM | POA: Diagnosis present

## 2022-09-04 DIAGNOSIS — J9601 Acute respiratory failure with hypoxia: Secondary | ICD-10-CM | POA: Diagnosis not present

## 2022-09-04 DIAGNOSIS — Z7982 Long term (current) use of aspirin: Secondary | ICD-10-CM

## 2022-09-04 DIAGNOSIS — Z833 Family history of diabetes mellitus: Secondary | ICD-10-CM

## 2022-09-04 DIAGNOSIS — I959 Hypotension, unspecified: Secondary | ICD-10-CM | POA: Diagnosis not present

## 2022-09-04 DIAGNOSIS — M797 Fibromyalgia: Secondary | ICD-10-CM | POA: Diagnosis present

## 2022-09-04 DIAGNOSIS — Z888 Allergy status to other drugs, medicaments and biological substances status: Secondary | ICD-10-CM

## 2022-09-04 DIAGNOSIS — J9811 Atelectasis: Secondary | ICD-10-CM | POA: Diagnosis not present

## 2022-09-04 DIAGNOSIS — K219 Gastro-esophageal reflux disease without esophagitis: Secondary | ICD-10-CM | POA: Diagnosis present

## 2022-09-04 DIAGNOSIS — R9439 Abnormal result of other cardiovascular function study: Secondary | ICD-10-CM

## 2022-09-04 DIAGNOSIS — Z841 Family history of disorders of kidney and ureter: Secondary | ICD-10-CM

## 2022-09-04 DIAGNOSIS — R079 Chest pain, unspecified: Secondary | ICD-10-CM | POA: Diagnosis present

## 2022-09-04 HISTORY — PX: CORONARY ARTERY BYPASS GRAFT: SHX141

## 2022-09-04 HISTORY — DX: Presence of aortocoronary bypass graft: Z95.1

## 2022-09-04 HISTORY — PX: TEE WITHOUT CARDIOVERSION: SHX5443

## 2022-09-04 LAB — POCT I-STAT 7, (LYTES, BLD GAS, ICA,H+H)
Acid-Base Excess: 2 mmol/L (ref 0.0–2.0)
Acid-Base Excess: 1 mmol/L (ref 0.0–2.0)
Acid-base deficit: 3 mmol/L — ABNORMAL HIGH (ref 0.0–2.0)
Acid-base deficit: 6 mmol/L — ABNORMAL HIGH (ref 0.0–2.0)
Acid-base deficit: 7 mmol/L — ABNORMAL HIGH (ref 0.0–2.0)
Acid-base deficit: 8 mmol/L — ABNORMAL HIGH (ref 0.0–2.0)
Acid-base deficit: 7 mmol/L — ABNORMAL HIGH (ref 0.0–2.0)
Bicarbonate: 23.2 mmol/L (ref 20.0–28.0)
Bicarbonate: 25.5 mmol/L (ref 20.0–28.0)
Bicarbonate: 21.5 mmol/L (ref 20.0–28.0)
Bicarbonate: 20.5 mmol/L (ref 20.0–28.0)
Bicarbonate: 25.9 mmol/L (ref 20.0–28.0)
Bicarbonate: 17.1 mmol/L — ABNORMAL LOW (ref 20.0–28.0)
Bicarbonate: 18.8 mmol/L — ABNORMAL LOW (ref 20.0–28.0)
Calcium, Ion: 1.06 mmol/L — ABNORMAL LOW (ref 1.15–1.40)
Calcium, Ion: 1.1 mmol/L — ABNORMAL LOW (ref 1.15–1.40)
Calcium, Ion: 1.39 mmol/L (ref 1.15–1.40)
Calcium, Ion: 1.34 mmol/L (ref 1.15–1.40)
Calcium, Ion: 1.27 mmol/L (ref 1.15–1.40)
Calcium, Ion: 1.19 mmol/L (ref 1.15–1.40)
Calcium, Ion: 1.28 mmol/L (ref 1.15–1.40)
HCT: 24 % — ABNORMAL LOW (ref 36.0–46.0)
HCT: 25 % — ABNORMAL LOW (ref 36.0–46.0)
HCT: 29 % — ABNORMAL LOW (ref 36.0–46.0)
HCT: 29 % — ABNORMAL LOW (ref 36.0–46.0)
HCT: 25 % — ABNORMAL LOW (ref 36.0–46.0)
HCT: 24 % — ABNORMAL LOW (ref 36.0–46.0)
HCT: 28 % — ABNORMAL LOW (ref 36.0–46.0)
Hemoglobin: 8.2 g/dL — ABNORMAL LOW (ref 12.0–15.0)
Hemoglobin: 8.5 g/dL — ABNORMAL LOW (ref 12.0–15.0)
Hemoglobin: 9.9 g/dL — ABNORMAL LOW (ref 12.0–15.0)
Hemoglobin: 9.9 g/dL — ABNORMAL LOW (ref 12.0–15.0)
Hemoglobin: 8.5 g/dL — ABNORMAL LOW (ref 12.0–15.0)
Hemoglobin: 8.2 g/dL — ABNORMAL LOW (ref 12.0–15.0)
Hemoglobin: 9.5 g/dL — ABNORMAL LOW (ref 12.0–15.0)
O2 Saturation: 100 %
O2 Saturation: 100 %
O2 Saturation: 97 %
O2 Saturation: 99 %
O2 Saturation: 99 %
O2 Saturation: 95 %
O2 Saturation: 91 %
Patient temperature: 36.8
Patient temperature: 36.8
Patient temperature: 37.5
Patient temperature: 37.1
Patient temperature: 37.1
Potassium: 4.1 mmol/L (ref 3.5–5.1)
Potassium: 5.4 mmol/L — ABNORMAL HIGH (ref 3.5–5.1)
Potassium: 4.4 mmol/L (ref 3.5–5.1)
Potassium: 4.5 mmol/L (ref 3.5–5.1)
Potassium: 4.5 mmol/L (ref 3.5–5.1)
Potassium: 3.8 mmol/L (ref 3.5–5.1)
Potassium: 4.4 mmol/L (ref 3.5–5.1)
Sodium: 142 mmol/L (ref 135–145)
Sodium: 142 mmol/L (ref 135–145)
Sodium: 142 mmol/L (ref 135–145)
Sodium: 141 mmol/L (ref 135–145)
Sodium: 141 mmol/L (ref 135–145)
Sodium: 144 mmol/L (ref 135–145)
Sodium: 141 mmol/L (ref 135–145)
TCO2: 25 mmol/L (ref 22–32)
TCO2: 26 mmol/L (ref 22–32)
TCO2: 23 mmol/L (ref 22–32)
TCO2: 22 mmol/L (ref 22–32)
TCO2: 27 mmol/L (ref 22–32)
TCO2: 18 mmol/L — ABNORMAL LOW (ref 22–32)
TCO2: 20 mmol/L — ABNORMAL LOW (ref 22–32)
pCO2 arterial: 33.7 mmHg (ref 32–48)
pCO2 arterial: 44.9 mmHg (ref 32–48)
pCO2 arterial: 51.6 mmHg — ABNORMAL HIGH (ref 32–48)
pCO2 arterial: 47.3 mmHg (ref 32–48)
pCO2 arterial: 44.1 mmHg (ref 32–48)
pCO2 arterial: 34.1 mmHg (ref 32–48)
pCO2 arterial: 38.6 mmHg (ref 32–48)
pH, Arterial: 7.321 — ABNORMAL LOW (ref 7.35–7.45)
pH, Arterial: 7.487 — ABNORMAL HIGH (ref 7.35–7.45)
pH, Arterial: 7.226 — ABNORMAL LOW (ref 7.35–7.45)
pH, Arterial: 7.243 — ABNORMAL LOW (ref 7.35–7.45)
pH, Arterial: 7.378 (ref 7.35–7.45)
pH, Arterial: 7.309 — ABNORMAL LOW (ref 7.35–7.45)
pH, Arterial: 7.296 — ABNORMAL LOW (ref 7.35–7.45)
pO2, Arterial: 334 mmHg — ABNORMAL HIGH (ref 83–108)
pO2, Arterial: 342 mmHg — ABNORMAL HIGH (ref 83–108)
pO2, Arterial: 103 mmHg (ref 83–108)
pO2, Arterial: 154 mmHg — ABNORMAL HIGH (ref 83–108)
pO2, Arterial: 156 mmHg — ABNORMAL HIGH (ref 83–108)
pO2, Arterial: 85 mmHg (ref 83–108)
pO2, Arterial: 67 mmHg — ABNORMAL LOW (ref 83–108)

## 2022-09-04 LAB — GLUCOSE, CAPILLARY
Glucose-Capillary: 114 mg/dL — ABNORMAL HIGH (ref 70–99)
Glucose-Capillary: 122 mg/dL — ABNORMAL HIGH (ref 70–99)
Glucose-Capillary: 125 mg/dL — ABNORMAL HIGH (ref 70–99)
Glucose-Capillary: 134 mg/dL — ABNORMAL HIGH (ref 70–99)
Glucose-Capillary: 136 mg/dL — ABNORMAL HIGH (ref 70–99)
Glucose-Capillary: 147 mg/dL — ABNORMAL HIGH (ref 70–99)
Glucose-Capillary: 157 mg/dL — ABNORMAL HIGH (ref 70–99)
Glucose-Capillary: 160 mg/dL — ABNORMAL HIGH (ref 70–99)
Glucose-Capillary: 165 mg/dL — ABNORMAL HIGH (ref 70–99)
Glucose-Capillary: 168 mg/dL — ABNORMAL HIGH (ref 70–99)

## 2022-09-04 LAB — CBC
HCT: 30.5 % — ABNORMAL LOW (ref 36.0–46.0)
HCT: 27.9 % — ABNORMAL LOW (ref 36.0–46.0)
Hemoglobin: 9.8 g/dL — ABNORMAL LOW (ref 12.0–15.0)
Hemoglobin: 9.3 g/dL — ABNORMAL LOW (ref 12.0–15.0)
MCH: 27.1 pg (ref 26.0–34.0)
MCH: 27.4 pg (ref 26.0–34.0)
MCHC: 32.1 g/dL (ref 30.0–36.0)
MCHC: 33.3 g/dL (ref 30.0–36.0)
MCV: 84.3 fL (ref 80.0–100.0)
MCV: 82.3 fL (ref 80.0–100.0)
Platelets: 228 10*3/uL (ref 150–400)
Platelets: 250 10*3/uL (ref 150–400)
RBC: 3.62 MIL/uL — ABNORMAL LOW (ref 3.87–5.11)
RBC: 3.39 MIL/uL — ABNORMAL LOW (ref 3.87–5.11)
RDW: 14.6 % (ref 11.5–15.5)
RDW: 14.8 % (ref 11.5–15.5)
WBC: 14.7 10*3/uL — ABNORMAL HIGH (ref 4.0–10.5)
WBC: 12.5 10*3/uL — ABNORMAL HIGH (ref 4.0–10.5)
nRBC: 0 % (ref 0.0–0.2)
nRBC: 0 % (ref 0.0–0.2)

## 2022-09-04 LAB — BASIC METABOLIC PANEL
Anion gap: 4 — ABNORMAL LOW (ref 5–15)
BUN: 21 mg/dL (ref 8–23)
CO2: 22 mmol/L (ref 22–32)
Calcium: 8.4 mg/dL — ABNORMAL LOW (ref 8.9–10.3)
Chloride: 114 mmol/L — ABNORMAL HIGH (ref 98–111)
Creatinine, Ser: 1.28 mg/dL — ABNORMAL HIGH (ref 0.44–1.00)
GFR, Estimated: 45 mL/min — ABNORMAL LOW (ref >60)
Glucose, Bld: 163 mg/dL — ABNORMAL HIGH (ref 70–99)
Potassium: 4.6 mmol/L (ref 3.5–5.1)
Sodium: 140 mmol/L (ref 135–145)

## 2022-09-04 LAB — MAGNESIUM: Magnesium: 3.6 mg/dL — ABNORMAL HIGH (ref 1.7–2.4)

## 2022-09-04 LAB — POCT I-STAT, CHEM 8
BUN: 25 mg/dL — ABNORMAL HIGH (ref 8–23)
BUN: 23 mg/dL (ref 8–23)
BUN: 24 mg/dL — ABNORMAL HIGH (ref 8–23)
BUN: 24 mg/dL — ABNORMAL HIGH (ref 8–23)
Calcium, Ion: 1.29 mmol/L (ref 1.15–1.40)
Calcium, Ion: 1.29 mmol/L (ref 1.15–1.40)
Calcium, Ion: 1.03 mmol/L — ABNORMAL LOW (ref 1.15–1.40)
Calcium, Ion: 1.39 mmol/L (ref 1.15–1.40)
Chloride: 112 mmol/L — ABNORMAL HIGH (ref 98–111)
Chloride: 111 mmol/L (ref 98–111)
Chloride: 111 mmol/L (ref 98–111)
Chloride: 111 mmol/L (ref 98–111)
Creatinine, Ser: 1.1 mg/dL — ABNORMAL HIGH (ref 0.44–1.00)
Creatinine, Ser: 1.1 mg/dL — ABNORMAL HIGH (ref 0.44–1.00)
Creatinine, Ser: 0.9 mg/dL (ref 0.44–1.00)
Creatinine, Ser: 1 mg/dL (ref 0.44–1.00)
Glucose, Bld: 131 mg/dL — ABNORMAL HIGH (ref 70–99)
Glucose, Bld: 102 mg/dL — ABNORMAL HIGH (ref 70–99)
Glucose, Bld: 107 mg/dL — ABNORMAL HIGH (ref 70–99)
Glucose, Bld: 135 mg/dL — ABNORMAL HIGH (ref 70–99)
HCT: 24 % — ABNORMAL LOW (ref 36.0–46.0)
HCT: 21 % — ABNORMAL LOW (ref 36.0–46.0)
HCT: 22 % — ABNORMAL LOW (ref 36.0–46.0)
HCT: 23 % — ABNORMAL LOW (ref 36.0–46.0)
Hemoglobin: 8.2 g/dL — ABNORMAL LOW (ref 12.0–15.0)
Hemoglobin: 7.1 g/dL — ABNORMAL LOW (ref 12.0–15.0)
Hemoglobin: 7.5 g/dL — ABNORMAL LOW (ref 12.0–15.0)
Hemoglobin: 7.8 g/dL — ABNORMAL LOW (ref 12.0–15.0)
Potassium: 4.6 mmol/L (ref 3.5–5.1)
Potassium: 4.2 mmol/L (ref 3.5–5.1)
Potassium: 4.8 mmol/L (ref 3.5–5.1)
Potassium: 5 mmol/L (ref 3.5–5.1)
Sodium: 142 mmol/L (ref 135–145)
Sodium: 141 mmol/L (ref 135–145)
Sodium: 141 mmol/L (ref 135–145)
Sodium: 141 mmol/L (ref 135–145)
TCO2: 22 mmol/L (ref 22–32)
TCO2: 22 mmol/L (ref 22–32)
TCO2: 23 mmol/L (ref 22–32)
TCO2: 23 mmol/L (ref 22–32)

## 2022-09-04 LAB — POCT I-STAT EG7
Acid-base deficit: 5 mmol/L — ABNORMAL HIGH (ref 0.0–2.0)
Bicarbonate: 21.8 mmol/L (ref 20.0–28.0)
Calcium, Ion: 1.1 mmol/L — ABNORMAL LOW (ref 1.15–1.40)
HCT: 24 % — ABNORMAL LOW (ref 36.0–46.0)
Hemoglobin: 8.2 g/dL — ABNORMAL LOW (ref 12.0–15.0)
O2 Saturation: 78 %
Potassium: 5.1 mmol/L (ref 3.5–5.1)
Sodium: 142 mmol/L (ref 135–145)
TCO2: 23 mmol/L (ref 22–32)
pCO2, Ven: 47.7 mmHg (ref 44–60)
pH, Ven: 7.268 (ref 7.25–7.43)
pO2, Ven: 48 mmHg — ABNORMAL HIGH (ref 32–45)

## 2022-09-04 LAB — ECHO INTRAOPERATIVE TEE
Height: 61 in
Weight: 1748.79 oz

## 2022-09-04 LAB — PROTIME-INR
INR: 1.4 — ABNORMAL HIGH (ref 0.8–1.2)
Prothrombin Time: 16.7 seconds — ABNORMAL HIGH (ref 11.4–15.2)

## 2022-09-04 LAB — APTT: aPTT: 33 seconds (ref 24–36)

## 2022-09-04 LAB — HEMOGLOBIN AND HEMATOCRIT, BLOOD
HCT: 24.2 % — ABNORMAL LOW (ref 36.0–46.0)
Hemoglobin: 8 g/dL — ABNORMAL LOW (ref 12.0–15.0)

## 2022-09-04 LAB — PREPARE RBC (CROSSMATCH)

## 2022-09-04 LAB — ABO/RH: ABO/RH(D): O POS

## 2022-09-04 LAB — PLATELET COUNT: Platelets: 211 10*3/uL (ref 150–400)

## 2022-09-04 SURGERY — CORONARY ARTERY BYPASS GRAFTING (CABG)
Anesthesia: General | Site: Chest

## 2022-09-04 MED ORDER — 0.9 % SODIUM CHLORIDE (POUR BTL) OPTIME
TOPICAL | Status: DC | PRN
  Administered 2022-09-04: 5000 mL

## 2022-09-04 MED ORDER — NITROGLYCERIN IN D5W 200-5 MCG/ML-% IV SOLN
INTRAVENOUS | Status: DC | PRN
  Administered 2022-09-04: 50 mg via INTRAVENOUS
  Administered 2022-09-04: 10 ug/min via INTRAVENOUS

## 2022-09-04 MED ORDER — HEPARIN SODIUM (PORCINE) 1000 UNIT/ML IJ SOLN
INTRAMUSCULAR | Status: DC | PRN
  Administered 2022-09-04: 8000 [IU] via INTRAVENOUS
  Administered 2022-09-04: 17000 [IU] via INTRAVENOUS
  Administered 2022-09-04: 4000 [IU] via INTRAVENOUS

## 2022-09-04 MED ORDER — TRAMADOL HCL 50 MG PO TABS
50.0000 mg | ORAL_TABLET | ORAL | Status: DC | PRN
  Administered 2022-09-05: 100 mg via ORAL
  Administered 2022-09-05: 50 mg via ORAL
  Administered 2022-09-06 – 2022-09-10 (×6): 100 mg via ORAL
  Administered 2022-09-10: 50 mg via ORAL
  Administered 2022-09-11 – 2022-09-15 (×7): 100 mg via ORAL
  Filled 2022-09-04 (×4): qty 2
  Filled 2022-09-04: qty 1
  Filled 2022-09-04: qty 2
  Filled 2022-09-04: qty 1
  Filled 2022-09-04 (×4): qty 2
  Filled 2022-09-04 (×2): qty 1
  Filled 2022-09-04 (×2): qty 2
  Filled 2022-09-04: qty 1
  Filled 2022-09-04 (×2): qty 2
  Filled 2022-09-04: qty 1

## 2022-09-04 MED ORDER — CHLORHEXIDINE GLUCONATE 0.12 % MT SOLN: 15.0000 mL | Freq: Once | OROMUCOSAL | Status: AC

## 2022-09-04 MED ORDER — LACTATED RINGERS IV SOLN: INTRAVENOUS | Status: DC | PRN

## 2022-09-04 MED ORDER — ONDANSETRON HCL 4 MG/2ML IJ SOLN: 4.0000 mg | Freq: Four times a day (QID) | INTRAMUSCULAR | Status: DC | PRN

## 2022-09-04 MED ORDER — ALBUMIN HUMAN 5 % IV SOLN
250.0000 mL | INTRAVENOUS | Status: AC | PRN
  Administered 2022-09-04 (×2): 12.5 g via INTRAVENOUS

## 2022-09-04 MED ORDER — OXYCODONE HCL 5 MG PO TABS
5.0000 mg | ORAL_TABLET | ORAL | Status: DC | PRN
  Administered 2022-09-08: 5 mg via ORAL
  Administered 2022-09-15 – 2022-09-17 (×8): 10 mg via ORAL
  Filled 2022-09-04 (×7): qty 2
  Filled 2022-09-04: qty 1
  Filled 2022-09-04: qty 2

## 2022-09-04 MED ORDER — NOREPINEPHRINE 4 MG/250ML-% IV SOLN
0.0000 ug/min | INTRAVENOUS | Status: DC
  Administered 2022-09-04: 2 ug/min via INTRAVENOUS

## 2022-09-04 MED ORDER — DEXTROSE 50 % IV SOLN: 0.0000 mL | INTRAVENOUS | Status: DC | PRN

## 2022-09-04 MED ORDER — MAGNESIUM SULFATE 4 GM/100ML IV SOLN
4.0000 g | Freq: Once | INTRAVENOUS | Status: AC
  Administered 2022-09-04: 4 g via INTRAVENOUS
  Filled 2022-09-04: qty 100

## 2022-09-04 MED ORDER — LACTATED RINGERS IV SOLN: INTRAVENOUS | Status: DC

## 2022-09-04 MED ORDER — ACETAMINOPHEN 500 MG PO TABS
1000.0000 mg | ORAL_TABLET | Freq: Four times a day (QID) | ORAL | Status: AC
  Administered 2022-09-05 – 2022-09-09 (×15): 1000 mg via ORAL
  Filled 2022-09-04 (×16): qty 2

## 2022-09-04 MED ORDER — BISACODYL 5 MG PO TBEC
10.0000 mg | DELAYED_RELEASE_TABLET | Freq: Every day | ORAL | Status: DC
  Administered 2022-09-05 – 2022-09-13 (×4): 10 mg via ORAL
  Filled 2022-09-04 (×8): qty 2

## 2022-09-04 MED ORDER — SODIUM CHLORIDE (PF) 0.9 % IJ SOLN: OROMUCOSAL | Status: DC | PRN

## 2022-09-04 MED ORDER — EPHEDRINE SULFATE-NACL 50-0.9 MG/10ML-% IV SOSY
PREFILLED_SYRINGE | INTRAVENOUS | Status: DC | PRN
  Administered 2022-09-04 (×2): 5 mg via INTRAVENOUS

## 2022-09-04 MED ORDER — MIDAZOLAM HCL 2 MG/2ML IJ SOLN: 2.0000 mg | INTRAMUSCULAR | Status: DC | PRN

## 2022-09-04 MED ORDER — VANCOMYCIN HCL IN DEXTROSE 1-5 GM/200ML-% IV SOLN
1000.0000 mg | Freq: Once | INTRAVENOUS | Status: AC
  Administered 2022-09-04: 1000 mg via INTRAVENOUS
  Filled 2022-09-04: qty 200

## 2022-09-04 MED ORDER — SODIUM BICARBONATE 8.4 % IV SOLN
50.0000 meq | Freq: Once | INTRAVENOUS | Status: AC
  Administered 2022-09-04: 50 meq via INTRAVENOUS

## 2022-09-04 MED ORDER — PROTAMINE SULFATE 10 MG/ML IV SOLN
INTRAVENOUS | Status: DC | PRN
  Administered 2022-09-04: 150 mg via INTRAVENOUS

## 2022-09-04 MED ORDER — SODIUM BICARBONATE 8.4 % IV SOLN
50.0000 meq | Freq: Once | INTRAVENOUS | Status: AC
  Administered 2022-09-04: 50 meq via INTRAVENOUS
  Filled 2022-09-04: qty 50

## 2022-09-04 MED ORDER — NICARDIPINE HCL IN NACL 20-0.86 MG/200ML-% IV SOLN
0.0000 mg/h | INTRAVENOUS | Status: DC
  Administered 2022-09-04: 5 mg/h via INTRAVENOUS
  Administered 2022-09-05 (×2): 4 mg/h via INTRAVENOUS
  Filled 2022-09-04 (×3): qty 200

## 2022-09-04 MED ORDER — PLASMA-LYTE A IV SOLN: INTRAVENOUS | Status: DC | PRN

## 2022-09-04 MED ORDER — MIDAZOLAM HCL (PF) 10 MG/2ML IJ SOLN
INTRAMUSCULAR | Status: AC
  Filled 2022-09-04: qty 2

## 2022-09-04 MED ORDER — FENTANYL CITRATE (PF) 250 MCG/5ML IJ SOLN
INTRAMUSCULAR | Status: DC | PRN
  Administered 2022-09-04 (×4): 50 ug via INTRAVENOUS
  Administered 2022-09-04: 200 ug via INTRAVENOUS
  Administered 2022-09-04: 50 ug via INTRAVENOUS
  Administered 2022-09-04: 100 ug via INTRAVENOUS
  Administered 2022-09-04: 50 ug via INTRAVENOUS
  Administered 2022-09-04: 100 ug via INTRAVENOUS
  Administered 2022-09-04: 50 ug via INTRAVENOUS

## 2022-09-04 MED ORDER — FENTANYL CITRATE (PF) 250 MCG/5ML IJ SOLN
INTRAMUSCULAR | Status: AC
  Filled 2022-09-04: qty 5

## 2022-09-04 MED ORDER — SODIUM CHLORIDE 0.9 % IV SOLN: 250.0000 mL | INTRAVENOUS | Status: DC

## 2022-09-04 MED ORDER — ROCURONIUM BROMIDE 10 MG/ML (PF) SYRINGE
PREFILLED_SYRINGE | INTRAVENOUS | Status: DC | PRN
  Administered 2022-09-04 (×2): 50 mg via INTRAVENOUS
  Administered 2022-09-04: 100 mg via INTRAVENOUS

## 2022-09-04 MED ORDER — SODIUM CHLORIDE 0.9% FLUSH: 3.0000 mL | INTRAVENOUS | Status: DC | PRN

## 2022-09-04 MED ORDER — METOPROLOL TARTRATE 12.5 MG HALF TABLET
ORAL_TABLET | ORAL | Status: AC
  Filled 2022-09-04: qty 1

## 2022-09-04 MED ORDER — FAMOTIDINE IN NACL 20-0.9 MG/50ML-% IV SOLN
20.0000 mg | Freq: Two times a day (BID) | INTRAVENOUS | Status: DC
  Administered 2022-09-04: 20 mg via INTRAVENOUS
  Filled 2022-09-04: qty 50

## 2022-09-04 MED ORDER — ORAL CARE MOUTH RINSE
15.0000 mL | OROMUCOSAL | Status: DC
  Administered 2022-09-04 (×2): 15 mL via OROMUCOSAL

## 2022-09-04 MED ORDER — HEPARIN SODIUM (PORCINE) 1000 UNIT/ML IJ SOLN
INTRAMUSCULAR | Status: AC
  Filled 2022-09-04: qty 1

## 2022-09-04 MED ORDER — INSULIN REGULAR(HUMAN) IN NACL 100-0.9 UT/100ML-% IV SOLN: INTRAVENOUS | Status: DC

## 2022-09-04 MED ORDER — METOPROLOL TARTRATE 12.5 MG HALF TABLET
12.5000 mg | ORAL_TABLET | Freq: Two times a day (BID) | ORAL | Status: DC
  Administered 2022-09-05 (×2): 12.5 mg via ORAL
  Filled 2022-09-04 (×3): qty 1

## 2022-09-04 MED ORDER — POTASSIUM CHLORIDE 10 MEQ/50ML IV SOLN: 10.0000 meq | INTRAVENOUS | Status: AC

## 2022-09-04 MED ORDER — PHENYLEPHRINE HCL-NACL 20-0.9 MG/250ML-% IV SOLN: 0.0000 ug/min | INTRAVENOUS | Status: DC

## 2022-09-04 MED ORDER — PROTAMINE SULFATE 10 MG/ML IV SOLN
INTRAVENOUS | Status: AC
  Filled 2022-09-04: qty 25

## 2022-09-04 MED ORDER — BUSPIRONE HCL 15 MG PO TABS: 15.0000 mg | ORAL_TABLET | Freq: Two times a day (BID) | ORAL | Status: DC

## 2022-09-04 MED ORDER — CHLORHEXIDINE GLUCONATE 0.12 % MT SOLN
15.0000 mL | OROMUCOSAL | Status: AC
  Administered 2022-09-04: 15 mL via OROMUCOSAL
  Filled 2022-09-04: qty 15

## 2022-09-04 MED ORDER — DOCUSATE SODIUM 100 MG PO CAPS
200.0000 mg | ORAL_CAPSULE | Freq: Every day | ORAL | Status: DC
  Administered 2022-09-05 – 2022-09-13 (×5): 200 mg via ORAL
  Filled 2022-09-04 (×9): qty 2

## 2022-09-04 MED ORDER — SODIUM CHLORIDE 0.45 % IV SOLN: INTRAVENOUS | Status: DC | PRN

## 2022-09-04 MED ORDER — CEFAZOLIN SODIUM-DEXTROSE 2-4 GM/100ML-% IV SOLN
2.0000 g | Freq: Three times a day (TID) | INTRAVENOUS | Status: AC
  Administered 2022-09-04 – 2022-09-06 (×6): 2 g via INTRAVENOUS
  Filled 2022-09-04 (×6): qty 100

## 2022-09-04 MED ORDER — CHLORHEXIDINE GLUCONATE CLOTH 2 % EX PADS
6.0000 | MEDICATED_PAD | Freq: Every day | CUTANEOUS | Status: DC
  Administered 2022-09-05 – 2022-09-17 (×13): 6 via TOPICAL

## 2022-09-04 MED ORDER — MORPHINE SULFATE (PF) 2 MG/ML IV SOLN
1.0000 mg | INTRAVENOUS | Status: DC | PRN
  Administered 2022-09-04: 1 mg via INTRAVENOUS
  Administered 2022-09-04 – 2022-09-07 (×10): 2 mg via INTRAVENOUS
  Filled 2022-09-04 (×5): qty 1
  Filled 2022-09-04: qty 2
  Filled 2022-09-04 (×6): qty 1

## 2022-09-04 MED ORDER — ASPIRIN 81 MG PO CHEW
324.0000 mg | CHEWABLE_TABLET | Freq: Every day | ORAL | Status: DC
  Filled 2022-09-04: qty 4

## 2022-09-04 MED ORDER — LIDOCAINE 2% (20 MG/ML) 5 ML SYRINGE
INTRAMUSCULAR | Status: DC | PRN
  Administered 2022-09-04: 80 mg via INTRAVENOUS

## 2022-09-04 MED ORDER — BISACODYL 10 MG RE SUPP
10.0000 mg | Freq: Every day | RECTAL | Status: DC
  Filled 2022-09-04: qty 1

## 2022-09-04 MED ORDER — PROPOFOL 10 MG/ML IV BOLUS
INTRAVENOUS | Status: DC | PRN
  Administered 2022-09-04: 70 mg via INTRAVENOUS
  Administered 2022-09-04: 10 mg via INTRAVENOUS
  Administered 2022-09-04: 20 mg via INTRAVENOUS

## 2022-09-04 MED ORDER — BUSPIRONE HCL 5 MG PO TABS
7.5000 mg | ORAL_TABLET | Freq: Two times a day (BID) | ORAL | Status: DC
  Administered 2022-09-05 – 2022-09-17 (×25): 7.5 mg via ORAL
  Filled 2022-09-04 (×2): qty 2
  Filled 2022-09-04 (×4): qty 1
  Filled 2022-09-04: qty 2
  Filled 2022-09-04 (×2): qty 1
  Filled 2022-09-04 (×3): qty 2
  Filled 2022-09-04: qty 1
  Filled 2022-09-04 (×2): qty 2
  Filled 2022-09-04 (×4): qty 1
  Filled 2022-09-04: qty 2
  Filled 2022-09-04 (×4): qty 1
  Filled 2022-09-04 (×2): qty 2

## 2022-09-04 MED ORDER — DOBUTAMINE IN D5W 4-5 MG/ML-% IV SOLN: 0.0000 ug/kg/min | INTRAVENOUS | Status: DC

## 2022-09-04 MED ORDER — CHLORHEXIDINE GLUCONATE 4 % EX LIQD: 30.0000 mL | CUTANEOUS | Status: DC

## 2022-09-04 MED ORDER — ACETAMINOPHEN 160 MG/5ML PO SOLN: 1000.0000 mg | Freq: Four times a day (QID) | ORAL | Status: AC

## 2022-09-04 MED ORDER — METOPROLOL TARTRATE 5 MG/5ML IV SOLN
2.5000 mg | INTRAVENOUS | Status: DC | PRN
  Administered 2022-09-07 (×2): 5 mg via INTRAVENOUS
  Administered 2022-09-08 (×5): 2.5 mg via INTRAVENOUS
  Administered 2022-09-09: 5 mg via INTRAVENOUS
  Administered 2022-09-09 (×2): 2.5 mg via INTRAVENOUS
  Administered 2022-09-09 – 2022-09-11 (×4): 5 mg via INTRAVENOUS
  Administered 2022-09-15: 2.5 mg via INTRAVENOUS
  Filled 2022-09-04 (×12): qty 5

## 2022-09-04 MED ORDER — ACETAMINOPHEN 160 MG/5ML PO SOLN: 650.0000 mg | Freq: Once | ORAL | Status: AC

## 2022-09-04 MED ORDER — CHLORHEXIDINE GLUCONATE 0.12 % MT SOLN
OROMUCOSAL | Status: AC
  Administered 2022-09-04: 15 mL via OROMUCOSAL
  Filled 2022-09-04: qty 15

## 2022-09-04 MED ORDER — MIDAZOLAM HCL (PF) 5 MG/ML IJ SOLN
INTRAMUSCULAR | Status: DC | PRN
  Administered 2022-09-04: 1 mg via INTRAVENOUS
  Administered 2022-09-04 (×3): 2 mg via INTRAVENOUS

## 2022-09-04 MED ORDER — PROPOFOL 10 MG/ML IV BOLUS
INTRAVENOUS | Status: AC
  Filled 2022-09-04: qty 20

## 2022-09-04 MED ORDER — PHENYLEPHRINE 80 MCG/ML (10ML) SYRINGE FOR IV PUSH (FOR BLOOD PRESSURE SUPPORT)
PREFILLED_SYRINGE | INTRAVENOUS | Status: DC | PRN
  Administered 2022-09-04: 40 ug via INTRAVENOUS
  Administered 2022-09-04 (×2): 80 ug via INTRAVENOUS

## 2022-09-04 MED ORDER — ORAL CARE MOUTH RINSE: 15.0000 mL | OROMUCOSAL | Status: DC | PRN

## 2022-09-04 MED ORDER — VENLAFAXINE HCL ER 75 MG PO CP24
75.0000 mg | ORAL_CAPSULE | Freq: Every day | ORAL | Status: DC
  Administered 2022-09-05 – 2022-09-17 (×13): 75 mg via ORAL
  Filled 2022-09-04 (×14): qty 1

## 2022-09-04 MED ORDER — METOPROLOL TARTRATE 25 MG/10 ML ORAL SUSPENSION
12.5000 mg | Freq: Two times a day (BID) | ORAL | Status: DC
  Filled 2022-09-04: qty 10

## 2022-09-04 MED ORDER — DEXMEDETOMIDINE HCL IN NACL 400 MCG/100ML IV SOLN
0.0000 ug/kg/h | INTRAVENOUS | Status: DC
  Administered 2022-09-04: 0.5 ug/kg/h via INTRAVENOUS

## 2022-09-04 MED ORDER — NITROGLYCERIN IN D5W 200-5 MCG/ML-% IV SOLN
INTRAVENOUS | Status: AC
  Filled 2022-09-04: qty 250

## 2022-09-04 MED ORDER — ~~LOC~~ CARDIAC SURGERY, PATIENT & FAMILY EDUCATION
Freq: Once | Status: DC
  Filled 2022-09-04: qty 1

## 2022-09-04 MED ORDER — SODIUM CHLORIDE 0.9 % IV SOLN: INTRAVENOUS | Status: DC

## 2022-09-04 MED ORDER — ACETAMINOPHEN 650 MG RE SUPP
650.0000 mg | Freq: Once | RECTAL | Status: AC
  Administered 2022-09-04: 650 mg via RECTAL

## 2022-09-04 MED ORDER — ASPIRIN 325 MG PO TBEC
325.0000 mg | DELAYED_RELEASE_TABLET | Freq: Every day | ORAL | Status: DC
  Administered 2022-09-05 – 2022-09-17 (×13): 325 mg via ORAL
  Filled 2022-09-04 (×13): qty 1

## 2022-09-04 MED ORDER — METOPROLOL TARTRATE 12.5 MG HALF TABLET: 12.5000 mg | ORAL_TABLET | Freq: Once | ORAL | Status: DC

## 2022-09-04 MED ORDER — PANTOPRAZOLE SODIUM 40 MG PO TBEC
40.0000 mg | DELAYED_RELEASE_TABLET | Freq: Every day | ORAL | Status: DC
  Administered 2022-09-06 – 2022-09-17 (×12): 40 mg via ORAL
  Filled 2022-09-04 (×12): qty 1

## 2022-09-04 MED ORDER — LACTATED RINGERS IV SOLN: 500.0000 mL | Freq: Once | INTRAVENOUS | Status: DC | PRN

## 2022-09-04 MED ORDER — SODIUM CHLORIDE 0.9% FLUSH
3.0000 mL | Freq: Two times a day (BID) | INTRAVENOUS | Status: DC
  Administered 2022-09-05 – 2022-09-07 (×4): 3 mL via INTRAVENOUS

## 2022-09-04 SURGICAL SUPPLY — 92 items
BAG DECANTER FOR FLEXI CONT (MISCELLANEOUS) ×2 IMPLANT
BLADE CLIPPER SURG (BLADE) ×2 IMPLANT
BLADE STERNUM SYSTEM 6 (BLADE) ×2 IMPLANT
BLADE SURG 11 STRL SS (BLADE) IMPLANT
BNDG ELASTIC 4X5.8 VLCR STR LF (GAUZE/BANDAGES/DRESSINGS) ×2 IMPLANT
BNDG ELASTIC 6X5.8 VLCR STR LF (GAUZE/BANDAGES/DRESSINGS) ×2 IMPLANT
BNDG GAUZE DERMACEA FLUFF 4 (GAUZE/BANDAGES/DRESSINGS) ×2 IMPLANT
CABLE SURGICAL S-101-97-12 (CABLE) ×2 IMPLANT
CANISTER SUCT 3000ML PPV (MISCELLANEOUS) ×2 IMPLANT
CANNULA MC2 2 STG 29/37 NON-V (CANNULA) ×2 IMPLANT
CANNULA MC2 TWO STAGE (CANNULA) ×2
CANNULA NON VENT 20FR 12 (CANNULA) ×2 IMPLANT
CATH CPB KIT GERHARDT (MISCELLANEOUS) IMPLANT
CATH ROBINSON RED A/P 18FR (CATHETERS) ×4 IMPLANT
CLIP RETRACTION 3.0MM CORONARY (MISCELLANEOUS) ×2 IMPLANT
CLIP TI MEDIUM 24 (CLIP) IMPLANT
CLIP VESOCCLUDE MED 24/CT (CLIP) IMPLANT
CLIP VESOCCLUDE SM WIDE 24/CT (CLIP) IMPLANT
CONN ST 1/2X1/2  BEN (MISCELLANEOUS) ×2
CONN ST 1/2X1/2 BEN (MISCELLANEOUS) ×2 IMPLANT
CONNECTOR BLAKE 2:1 CARIO BLK (MISCELLANEOUS) ×2 IMPLANT
CONTAINER PROTECT SURGISLUSH (MISCELLANEOUS) ×4 IMPLANT
DERMABOND ADVANCED .7 DNX12 (GAUZE/BANDAGES/DRESSINGS) IMPLANT
DRAIN CHANNEL 19F RND (DRAIN) ×6 IMPLANT
DRAIN CONNECTOR BLAKE 1:1 (MISCELLANEOUS) ×2 IMPLANT
DRAPE CARDIOVASCULAR INCISE (DRAPES) ×2
DRAPE INCISE IOBAN 66X45 STRL (DRAPES) IMPLANT
DRAPE SRG 135X102X78XABS (DRAPES) ×2 IMPLANT
DRAPE WARM FLUID 44X44 (DRAPES) ×2 IMPLANT
DRSG AQUACEL AG ADV 3.5X10 (GAUZE/BANDAGES/DRESSINGS) ×2 IMPLANT
DRSG COVADERM 4X14 (GAUZE/BANDAGES/DRESSINGS) ×2 IMPLANT
ELECT BLADE 4.0 EZ CLEAN MEGAD (MISCELLANEOUS) ×2
ELECT REM PT RETURN 9FT ADLT (ELECTROSURGICAL) ×4
ELECTRODE BLDE 4.0 EZ CLN MEGD (MISCELLANEOUS) ×2 IMPLANT
ELECTRODE REM PT RTRN 9FT ADLT (ELECTROSURGICAL) ×4 IMPLANT
FELT TEFLON 1X6 (MISCELLANEOUS) ×4 IMPLANT
GAUZE 4X4 16PLY ~~LOC~~+RFID DBL (SPONGE) ×2 IMPLANT
GAUZE SPONGE 4X4 12PLY STRL (GAUZE/BANDAGES/DRESSINGS) ×4 IMPLANT
GAUZE SPONGE 4X4 12PLY STRL LF (GAUZE/BANDAGES/DRESSINGS) IMPLANT
GLOVE BIO SURGEON STRL SZ 6.5 (GLOVE) IMPLANT
GLOVE BIO SURGEON STRL SZ7 (GLOVE) ×4 IMPLANT
GLOVE BIO SURGEON STRL SZ7.5 (GLOVE) IMPLANT
GLOVE BIOGEL M STRL SZ7.5 (GLOVE) ×4 IMPLANT
GLOVE SS BIOGEL STRL SZ 6 (GLOVE) IMPLANT
GOWN STRL REUS W/ TWL LRG LVL3 (GOWN DISPOSABLE) ×8 IMPLANT
GOWN STRL REUS W/ TWL XL LVL3 (GOWN DISPOSABLE) ×4 IMPLANT
GOWN STRL REUS W/TWL LRG LVL3 (GOWN DISPOSABLE) ×8
GOWN STRL REUS W/TWL XL LVL3 (GOWN DISPOSABLE) ×4
HEMOSTAT POWDER SURGIFOAM 1G (HEMOSTASIS) ×6 IMPLANT
INSERT SUTURE HOLDER (MISCELLANEOUS) ×2 IMPLANT
KIT BASIN OR (CUSTOM PROCEDURE TRAY) ×2 IMPLANT
KIT SUCTION CATH 14FR (SUCTIONS) ×2 IMPLANT
KIT TURNOVER KIT B (KITS) ×2 IMPLANT
KIT VASOVIEW HEMOPRO 2 VH 4000 (KITS) ×2 IMPLANT
LEAD PACING MYOCARDI (MISCELLANEOUS) ×2 IMPLANT
MARKER GRAFT CORONARY BYPASS (MISCELLANEOUS) ×6 IMPLANT
NS IRRIG 1000ML POUR BTL (IV SOLUTION) ×10 IMPLANT
PACK ACCESSORY CANNULA KIT (KITS) ×2 IMPLANT
PACK E OPEN HEART (SUTURE) ×2 IMPLANT
PACK OPEN HEART (CUSTOM PROCEDURE TRAY) ×2 IMPLANT
PAD ARMBOARD 7.5X6 YLW CONV (MISCELLANEOUS) ×4 IMPLANT
PAD ELECT DEFIB RADIOL ZOLL (MISCELLANEOUS) ×2 IMPLANT
PENCIL BUTTON HOLSTER BLD 10FT (ELECTRODE) ×2 IMPLANT
POSITIONER HEAD DONUT 9IN (MISCELLANEOUS) ×2 IMPLANT
PUNCH AORTIC ROTATE 4.0MM (MISCELLANEOUS) ×2 IMPLANT
SET MPS 3-ND DEL (MISCELLANEOUS) IMPLANT
SPONGE T-LAP 18X18 ~~LOC~~+RFID (SPONGE) ×8 IMPLANT
SUPPORT HEART JANKE-BARRON (MISCELLANEOUS) ×2 IMPLANT
SUT BONE WAX W31G (SUTURE) ×2 IMPLANT
SUT ETHIBOND X763 2 0 SH 1 (SUTURE) ×4 IMPLANT
SUT MNCRL AB 3-0 PS2 18 (SUTURE) ×4 IMPLANT
SUT MNCRL AB 4-0 PS2 18 (SUTURE) IMPLANT
SUT PDS AB 1 CTX 36 (SUTURE) ×4 IMPLANT
SUT PROLENE 4 0 RB 1 (SUTURE) ×2
SUT PROLENE 4 0 SH DA (SUTURE) ×2 IMPLANT
SUT PROLENE 4-0 RB1 .5 CRCL 36 (SUTURE) IMPLANT
SUT PROLENE 5 0 C 1 36 (SUTURE) ×6 IMPLANT
SUT PROLENE 7 0 BV 1 (SUTURE) IMPLANT
SUT PROLENE 7 0 BV1 MDA (SUTURE) ×2 IMPLANT
SUT STEEL 6MS V (SUTURE) ×4 IMPLANT
SUT VIC AB 1 CTX 36 (SUTURE) ×2
SUT VIC AB 1 CTX36XBRD ANBCTR (SUTURE) IMPLANT
SUT VIC AB 2-0 CT1 27 (SUTURE) ×4
SUT VIC AB 2-0 CT1 TAPERPNT 27 (SUTURE) IMPLANT
SYSTEM SAHARA CHEST DRAIN ATS (WOUND CARE) ×2 IMPLANT
TAPE CLOTH SURG 4X10 WHT LF (GAUZE/BANDAGES/DRESSINGS) IMPLANT
TOWEL GREEN STERILE (TOWEL DISPOSABLE) ×2 IMPLANT
TOWEL GREEN STERILE FF (TOWEL DISPOSABLE) ×2 IMPLANT
TRAY FOLEY SLVR 16FR TEMP STAT (SET/KITS/TRAYS/PACK) ×2 IMPLANT
TUBING LAP HI FLOW INSUFFLATIO (TUBING) ×2 IMPLANT
UNDERPAD 30X36 HEAVY ABSORB (UNDERPADS AND DIAPERS) ×2 IMPLANT
WATER STERILE IRR 1000ML POUR (IV SOLUTION) ×4 IMPLANT

## 2022-09-04 NOTE — Anesthesia Procedure Notes (Signed)
Procedure Name: Intubation Date/Time: 09/04/2022 7:58 AM  Performed by: Dorthea Cove, CRNAPre-anesthesia Checklist: Patient identified, Emergency Drugs available, Suction available and Patient being monitored Patient Re-evaluated:Patient Re-evaluated prior to induction Oxygen Delivery Method: Circle System Utilized Preoxygenation: Pre-oxygenation with 100% oxygen Induction Type: IV induction Ventilation: Mask ventilation without difficulty Laryngoscope Size: Mac and 3 Grade View: Grade I Tube type: Oral Tube size: 8.0 mm Number of attempts: 1 Airway Equipment and Method: Stylet Placement Confirmation: ETT inserted through vocal cords under direct vision, positive ETCO2 and breath sounds checked- equal and bilateral Secured at: 23 cm Tube secured with: Tape Dental Injury: Teeth and Oropharynx as per pre-operative assessment  Comments: Performed by Encarnacion Chu, SRNA.

## 2022-09-04 NOTE — Anesthesia Procedure Notes (Signed)
Central Venous Catheter Insertion Performed by: Santa Lighter, MD, anesthesiologist Start/End10/30/2023 6:50 AM, 09/04/2022 7:00 AM Patient location: Pre-op. Preanesthetic checklist: patient identified, IV checked, site marked, risks and benefits discussed, surgical consent, monitors and equipment checked, pre-op evaluation, timeout performed and anesthesia consent Position: Trendelenburg Lidocaine 1% used for infiltration and patient sedated Hand hygiene performed , maximum sterile barriers used  and Seldinger technique used Catheter size: 8.5 Fr Central line was placed.Sheath introducer Procedure performed using ultrasound guided technique. Ultrasound Notes:anatomy identified, needle tip was noted to be adjacent to the nerve/plexus identified, no ultrasound evidence of intravascular and/or intraneural injection and image(s) printed for medical record Attempts: 1 Following insertion, line sutured, dressing applied and Biopatch. Post procedure assessment: free fluid flow, blood return through all ports and no air  Patient tolerated the procedure well with no immediate complications. Additional procedure comments: Triple lumen infusion catheter placed through introducer port.

## 2022-09-04 NOTE — Progress Notes (Signed)
Echocardiogram Echocardiogram Transesophageal has been performed.  Fidel Levy 09/04/2022, 8:10 AM

## 2022-09-04 NOTE — Procedures (Signed)
Extubation Procedure Note  Patient Details:   Name: Jasmine Keith DOB: Mar 12, 1951 MRN: [id number]   Airway Documentation:  Airway 8 mm (Active)  Secured at (cm) 23 cm 09/04/22 2056  Measured From Lips 09/04/22 2056  Secured Location Right 09/04/22 2056  Secured By Pink Tape 09/04/22 2056  Prone position No 09/04/22 2034  Cuff Pressure (cm H2O) Clear OR 27-39 CmH2O 09/04/22 2034  Site Condition Dry 09/04/22 2034   Vent end date: 09/04/22 Vent end time: 2126   Evaluation  O2 sats: stable throughout Complications: No apparent complications Patient did tolerate procedure well. Bilateral Breath Sounds: Clear, Diminished   Yes  NIF= -20 VC=.400 No upper airway stridor heard. Placed patient on 5lpm nasal cannula   Ulice Dash 09/04/2022, 9:28 PM

## 2022-09-04 NOTE — Transfer of Care (Signed)
Immediate Anesthesia Transfer of Care Note  Patient: Jasmine Keith  Procedure(s) Performed: CORONARY ARTERY BYPASS GRAFTING (CABG) X 3 USING LEFT INTERNAL MAMMARY AND BILATERAL LEG GREATER SAPHENOUS VEIN VARVESTED ENDOSCOPICALLY (Chest) TRANSESOPHAGEAL ECHOCARDIOGRAM (TEE)  Patient Location: SICU  Anesthesia Type:General  Level of Consciousness: sedated and Patient remains intubated per anesthesia plan  Airway & Oxygen Therapy: Patient remains intubated per anesthesia plan  Post-op Assessment: Report given to RN and Post -op Vital signs reviewed and stable  Post vital signs: Reviewed and stable  Last Vitals:  Vitals Value Taken Time  BP    Temp    Pulse    Resp    SpO2    See ICU vitals.  Last Pain:  Vitals:   09/04/22 0629  TempSrc:   PainSc: 0-No pain      Patients Stated Pain Goal: 0 (29/[id number]/71 [id number])  Complications: No notable events documented.
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
## 2022-09-04 NOTE — Interval H&P Note (Signed)
History and Physical Interval Note:  09/04/2022 7:36 AM  Jasmine Keith  has presented today for surgery, with the diagnosis of CAD.  The various methods of treatment have been discussed with the patient and family. After consideration of risks, benefits and other options for treatment, the patient has consented to  Procedure(s): CORONARY ARTERY BYPASS GRAFTING (CABG) (N/A) TRANSESOPHAGEAL ECHOCARDIOGRAM (TEE) (N/A) as a surgical intervention.  The patient's history has been reviewed, patient examined, no change in status, stable for surgery.  I have reviewed the patient's chart and labs.  Questions were answered to the patient's satisfaction.     [name]

## 2022-09-04 NOTE — Progress Notes (Signed)
eLink Physician-Brief Progress Note Patient Name: ALORIA LOOPER DOB: May 06, 1951 MRN: [id number]   Date of Service  09/04/2022  HPI/Events of Note  ABG post extubation = 4.982/64.1/58/30.9 c/w mild metabolic acidosis with incomplete respiratory compensation. Nursing reports incentive spirometry limited by pain.   eICU Interventions  Plan: NaHCO3 50 meq IV now. Nursing to work on pain management and incentive spirometry. Titrate O2 as needed to maintain sat > 92%.     Intervention Category Major Interventions: Hypoxemia - evaluation and management;Acid-Base disturbance - evaluation and management  [place] Silveria Eugene 09/04/2022, 10:54 PM

## 2022-09-04 NOTE — Hospital Course (Addendum)
  History of Present Illness:    At time of CT surgical consultation 71 year old female presents for surgical evaluation of the risk of coronary artery disease.  She has a history of diabetes, and PCI to the right coronary artery several years ago.  Over the last several months she has had worsening exertional dyspnea and neck pain.  She subsequently underwent a left heart catheterization which identified the disease.  She was evaluated by Dr. Kipp Brood who recommended proceeding with CABG.  She was admitted this hospitalization for the procedure.  Hospital course:  The patient was admitted electively and on 09/04/2022 she was taken the operating room at which time she underwent coronary artery bypass grafting x3.  She had a LIMA-LAD, saphenous vein graft-PDA, saphenous vein graft-OM.  She tolerated the procedure well and was taken to the surgical ICU in stable condition.  Postoperative hospital course:  The patient was extubated using standard post cardiac surgical protocols without difficulty.  She did show some acidosis requiring bicarbonate on ABG.  She maintained stable hemodynamics and required no inotropes or vasopressors.  She did require some Cardene for a short-term for blood pressure control.  All routine lines, monitors and drainage devices have been discontinued in the standard fashion.  She has shown some postoperative delirium but this is showing improvement over time.  She does show some expected postoperative volume overload and is requiring some diuretics.  She had an initial mild bump in her creatinine to 1.45 but is improving over time.  She does have an expected acute blood loss anemia which has stabilized and is equilibrating.  She has a significant history of tobacco abuse and is requiring aggressive pulmonary hygiene as well as nebulizers.  Oxygen is being weaned over time.  On postoperative day #2 she was transferred to the telemetry unit.  On postoperative day #3 she was  transferred back to the SICU due to worsening blood pressure control and delirium.  She has been a difficult challenge at time for nursing, refusing care and medications.  She frequently removes oxygen.  On postop day #4 delirium showed some improvement and Cardene is being weaned as she advances to taking her p.o. medications.  The delirium resolved but she continued to resist participating in cardiac rehab and ambulation.  She continued to be hypertensive.  Medications were adjusted.  She was eventually weaned off of the nicardipine infusion on postop day 5 but it had to be resumed later that night.  Clonidine was added to her losartan, hydralazine, metoprolol, and amlodipine. She was again weaned off the Cardene drip and transferred to 4E for further convalescence. Cardiology was consulted. Lopressor was stopped and Coreg 12.5 mg bid was started. Hydralazine was increased to 100 mg tid. Losartan was then 75 mg on 11/09;however, because of SBP increasing in afternoon, this was changed to 50 mg bid.. Blood pressure remained elevated. Cardiology was re-consulted and recommended discontinuation of Cozaar and they initiated Entresto.

## 2022-09-04 NOTE — Op Note (Signed)
[address]       [place] [address]             [phone]                                          09/04/2022 Patient:  Jasmine Keith Pre-Op Dx: 3V CAD HTN HLP DM   Post-op Dx:  same Procedure: CABG X 3.  LIMA LAD, RSVG OM, PDA   Endoscopic greater saphenous vein harvest on the right and left   Surgeon and Role:      * Tonee Silverstein, Lucile Crater, MD - Primary    * B. Stehler , PA-C - assisting An experienced assistant was required given the complexity of this surgery and the standard of surgical care. The assistant was needed for exposure, dissection, suctioning, retraction of delicate tissues and sutures, instrument exchange and for overall help during this procedure.    Anesthesia  general EBL:  531m Blood Administration: 2 unit pRBCs Xclamp Time:  45 min Pump Time:  874m  Drains: 1936 blake drain: L, mediastinal  Wires: ventricular Counts: correct   Indications: [age]ear old female with three-vessel coronary artery disease.  Echocardiogram reveals preserved biventricular function and no significant valvular disease.  On review of her left heart catheterization she does have in-stent stenosis of the RCA stent, LAD disease, disease involving both obtuse marginals.  We discussed the risks and benefits of CABG 3.  She is agreeable to proceed.  Findings: Good LIMA, small vein.  Good PDA, and OM.  Intramyocardial LAD  Operative Technique: All invasive lines were placed in pre-op holding.  After the risks, benefits and alternatives were thoroughly discussed, the patient was brought to the operative theatre.  Anesthesia was induced, and the patient was prepped and draped in normal sterile fashion.  An appropriate surgical pause was performed, and pre-operative antibiotics were dosed accordingly.  We began with simultaneous incisions along the right leg for harvesting of the greater saphenous vein and the chest for the sternotomy.  In regards to the  sternotomy, this was carried down with bovie cautery, and the sternum was divided with a reciprocating saw.  Meticulous hemostasis was obtained.  The left internal thoracic artery was exposed and harvested in in pedicled fashion.  The patient was systemically heparinized, and the artery was divided distally, and placed in a papaverine sponge.    The sternal elevator was removed, and a retractor was placed.  The pericardium was divided in the midline and fashioned into a cradle with pericardial stitches.   After we confirmed an appropriate ACT, the ascending aorta was cannulated in standard fashion.  The right atrial appendage was used for venous cannulation site.  Cardiopulmonary bypass was initiated, and the heart retractor was placed. The cross clamp was applied, and a dose of anterograde cardioplegia was given with good arrest of the heart.  We moved to the posterior wall of the heart, and found a good target on the PDA.  An arteriotomy was made, and the vein graft was anastomosed to it in an end to side fashion.  Next we exposed the lateral wall, and found a good target on the OM.  An end to side anastomosis with the vein graft was then created.  Finally, we exposed a good target on the LAD, and fashioned an end to side anastomosis between it  and the LITA.  We began to re-warm, and a re-animation dose of cardioplegia was given.  The heart was de-aired, and the cross clamp was removed.  Meticulous hemostasis was obtained.    A partial occludding clamp was then placed on the ascending aorta, and we created an end to side anastomosis between it and the proximal vein grafts.  Rings were placed on the proximal anastomosis.  Hemostasis was obtained, and we separated from cardiopulmonary bypass without event.  The heparin was reversed with protamine.  Chest tubes and wires were placed, and the sternum was re-approximated with sternal wires.  The soft tissue and skin were re-approximated wth absorbable suture.     The patient tolerated the procedure without any immediate complications, and was transferred to the ICU in guarded condition.  Ziasia Lenoir Bary Leriche

## 2022-09-04 NOTE — Anesthesia Procedure Notes (Signed)
Arterial Line Insertion Start/End10/30/2023 7:00 AM, 09/04/2022 7:20 AM Performed by: Dorthea Cove, RN, CRNA  Patient location: Pre-op. Preanesthetic checklist: patient identified, IV checked, site marked, risks and benefits discussed, surgical consent, monitors and equipment checked, pre-op evaluation, timeout performed and anesthesia consent Lidocaine 1% used for infiltration Left, radial was placed Catheter size: 20 G Hand hygiene performed  and maximum sterile barriers used   Attempts: 2 Procedure performed without using ultrasound guided technique. Following insertion, dressing applied and Biopatch. Post procedure assessment: normal and unchanged  Patient tolerated the procedure well with no immediate complications. Additional procedure comments: SRNA unsuccessfully attempted x1. CRNA successfully attempted x1.Marland Kitchen

## 2022-09-04 NOTE — Brief Op Note (Signed)
09/04/2022  7:07 AM  PATIENT:  Jasmine Keith  71 y.o. female  PRE-OPERATIVE DIAGNOSIS:  Coronary Artery Disease  POST-OPERATIVE DIAGNOSIS:  Coronary Artery Disease  PROCEDURE:  Procedure(s): CORONARY ARTERY BYPASS GRAFTING (CABG) X 3 USING LEFT INTERNAL MAMMARY AND BILATERAL LEG GREATER SAPHENOUS VEIN VARVESTED ENDOSCOPICALLY (N/A) TRANSESOPHAGEAL ECHOCARDIOGRAM (TEE) (N/A) Vein harvest time: 125mn Vein prep time: 228m  SURGEON:  Surgeon(s) and Role:    * Lightfoot, HaLucile CraterMD - Primary  PHYSICIAN ASSISTANT: BAILEY STEHLER PA-C, Aaleeyah Bias PA-C  ASSISTANTS: RNFA STAFF   ANESTHESIA:   general  EBL:  625 mL   BLOOD ADMINISTERED: 630 CC PRBC  DRAINS:  LEFT PLEURAL AND MEDIASTINAL CHEST DRAINS    LOCAL MEDICATIONS USED:  NONE  SPECIMEN:  No Specimen  DISPOSITION OF SPECIMEN:  N/A  COUNTS:  YES  TOURNIQUET:  * No tourniquets in log *  DICTATION: .Dragon Dictation  PLAN OF CARE: Admit to inpatient   PATIENT DISPOSITION:  ICU - intubated and hemodynamically stable.   Delay start of Pharmacological VTE agent (>24hrs) due to surgical blood loss or risk of bleeding: yes  COMPLICATIONS: NO KNOWN

## 2022-09-05 ENCOUNTER — Inpatient Hospital Stay (HOSPITAL_COMMUNITY): Payer: Medicare Other

## 2022-09-05 ENCOUNTER — Encounter (HOSPITAL_COMMUNITY): Payer: Self-pay | Admitting: Thoracic Surgery (Cardiothoracic Vascular Surgery)

## 2022-09-05 DIAGNOSIS — Z951 Presence of aortocoronary bypass graft: Secondary | ICD-10-CM | POA: Diagnosis not present

## 2022-09-05 DIAGNOSIS — N179 Acute kidney failure, unspecified: Secondary | ICD-10-CM

## 2022-09-05 DIAGNOSIS — R0689 Other abnormalities of breathing: Secondary | ICD-10-CM

## 2022-09-05 DIAGNOSIS — I2510 Atherosclerotic heart disease of native coronary artery without angina pectoris: Secondary | ICD-10-CM | POA: Diagnosis not present

## 2022-09-05 LAB — MAGNESIUM
Magnesium: 3.1 mg/dL — ABNORMAL HIGH (ref 1.7–2.4)
Magnesium: 2.7 mg/dL — ABNORMAL HIGH (ref 1.7–2.4)

## 2022-09-05 LAB — CBC
HCT: 27.2 % — ABNORMAL LOW (ref 36.0–46.0)
HCT: 26.5 % — ABNORMAL LOW (ref 36.0–46.0)
Hemoglobin: 9.1 g/dL — ABNORMAL LOW (ref 12.0–15.0)
Hemoglobin: 8.9 g/dL — ABNORMAL LOW (ref 12.0–15.0)
MCH: 27.7 pg (ref 26.0–34.0)
MCH: 28.1 pg (ref 26.0–34.0)
MCHC: 33.5 g/dL (ref 30.0–36.0)
MCHC: 33.6 g/dL (ref 30.0–36.0)
MCV: 82.9 fL (ref 80.0–100.0)
MCV: 83.6 fL (ref 80.0–100.0)
Platelets: 241 10*3/uL (ref 150–400)
Platelets: 221 10*3/uL (ref 150–400)
RBC: 3.28 MIL/uL — ABNORMAL LOW (ref 3.87–5.11)
RBC: 3.17 MIL/uL — ABNORMAL LOW (ref 3.87–5.11)
RDW: 15 % (ref 11.5–15.5)
RDW: 15.4 % (ref 11.5–15.5)
WBC: 11.5 10*3/uL — ABNORMAL HIGH (ref 4.0–10.5)
WBC: 10.3 10*3/uL (ref 4.0–10.5)
nRBC: 0 % (ref 0.0–0.2)
nRBC: 0 % (ref 0.0–0.2)

## 2022-09-05 LAB — BASIC METABOLIC PANEL
Anion gap: 8 (ref 5–15)
Anion gap: 10 (ref 5–15)
BUN: 24 mg/dL — ABNORMAL HIGH (ref 8–23)
BUN: 24 mg/dL — ABNORMAL HIGH (ref 8–23)
CO2: 22 mmol/L (ref 22–32)
CO2: 24 mmol/L (ref 22–32)
Calcium: 8.5 mg/dL — ABNORMAL LOW (ref 8.9–10.3)
Calcium: 8.7 mg/dL — ABNORMAL LOW (ref 8.9–10.3)
Chloride: 112 mmol/L — ABNORMAL HIGH (ref 98–111)
Chloride: 106 mmol/L (ref 98–111)
Creatinine, Ser: 1.45 mg/dL — ABNORMAL HIGH (ref 0.44–1.00)
Creatinine, Ser: 1.37 mg/dL — ABNORMAL HIGH (ref 0.44–1.00)
GFR, Estimated: 39 mL/min — ABNORMAL LOW (ref >60)
GFR, Estimated: 42 mL/min — ABNORMAL LOW (ref >60)
Glucose, Bld: 119 mg/dL — ABNORMAL HIGH (ref 70–99)
Glucose, Bld: 119 mg/dL — ABNORMAL HIGH (ref 70–99)
Potassium: 4.2 mmol/L (ref 3.5–5.1)
Potassium: 4.3 mmol/L (ref 3.5–5.1)
Sodium: 142 mmol/L (ref 135–145)
Sodium: 140 mmol/L (ref 135–145)

## 2022-09-05 LAB — GLUCOSE, CAPILLARY
Glucose-Capillary: 134 mg/dL — ABNORMAL HIGH (ref 70–99)
Glucose-Capillary: 135 mg/dL — ABNORMAL HIGH (ref 70–99)
Glucose-Capillary: 122 mg/dL — ABNORMAL HIGH (ref 70–99)
Glucose-Capillary: 152 mg/dL — ABNORMAL HIGH (ref 70–99)
Glucose-Capillary: 156 mg/dL — ABNORMAL HIGH (ref 70–99)
Glucose-Capillary: 140 mg/dL — ABNORMAL HIGH (ref 70–99)
Glucose-Capillary: 140 mg/dL — ABNORMAL HIGH (ref 70–99)
Glucose-Capillary: 76 mg/dL (ref 70–99)

## 2022-09-05 LAB — POCT I-STAT 7, (LYTES, BLD GAS, ICA,H+H)
Acid-base deficit: 1 mmol/L (ref 0.0–2.0)
Bicarbonate: 25.1 mmol/L (ref 20.0–28.0)
Calcium, Ion: 1.32 mmol/L (ref 1.15–1.40)
HCT: 39 % (ref 36.0–46.0)
Hemoglobin: 13.3 g/dL (ref 12.0–15.0)
O2 Saturation: 100 %
Potassium: 4.5 mmol/L (ref 3.5–5.1)
Sodium: 142 mmol/L (ref 135–145)
TCO2: 27 mmol/L (ref 22–32)
pCO2 arterial: 46.5 mmHg (ref 32–48)
pH, Arterial: 7.341 — ABNORMAL LOW (ref 7.35–7.45)
pO2, Arterial: 273 mmHg — ABNORMAL HIGH (ref 83–108)

## 2022-09-05 MED ORDER — HYDRALAZINE HCL 20 MG/ML IJ SOLN
10.0000 mg | Freq: Four times a day (QID) | INTRAMUSCULAR | Status: DC | PRN
  Administered 2022-09-05 – 2022-09-15 (×20): 10 mg via INTRAVENOUS
  Filled 2022-09-05 (×21): qty 1

## 2022-09-05 MED ORDER — INSULIN DETEMIR 100 UNIT/ML ~~LOC~~ SOLN
10.0000 [IU] | Freq: Once | SUBCUTANEOUS | Status: AC
  Administered 2022-09-05: 10 [IU] via SUBCUTANEOUS
  Filled 2022-09-05: qty 0.1

## 2022-09-05 MED ORDER — SODIUM CHLORIDE 0.9% FLUSH
10.0000 mL | Freq: Two times a day (BID) | INTRAVENOUS | Status: DC
  Administered 2022-09-05: 30 mL
  Administered 2022-09-06 – 2022-09-11 (×5): 10 mL

## 2022-09-05 MED ORDER — INSULIN ASPART 100 UNIT/ML IJ SOLN
0.0000 [IU] | INTRAMUSCULAR | Status: DC
  Administered 2022-09-05 – 2022-09-07 (×4): 2 [IU] via SUBCUTANEOUS

## 2022-09-05 MED ORDER — ENOXAPARIN SODIUM 40 MG/0.4ML IJ SOSY
40.0000 mg | PREFILLED_SYRINGE | Freq: Every day | INTRAMUSCULAR | Status: DC
  Administered 2022-09-05 – 2022-09-07 (×3): 40 mg via SUBCUTANEOUS
  Filled 2022-09-05 (×3): qty 0.4

## 2022-09-05 MED ORDER — SODIUM CHLORIDE 0.9% FLUSH: 10.0000 mL | INTRAVENOUS | Status: DC | PRN

## 2022-09-05 MED ORDER — AMLODIPINE BESYLATE 10 MG PO TABS
10.0000 mg | ORAL_TABLET | Freq: Every day | ORAL | Status: DC
  Administered 2022-09-05 – 2022-09-17 (×13): 10 mg via ORAL
  Filled 2022-09-05 (×13): qty 1

## 2022-09-05 MED ORDER — INSULIN DETEMIR 100 UNIT/ML ~~LOC~~ SOLN
10.0000 [IU] | Freq: Every day | SUBCUTANEOUS | Status: DC
  Administered 2022-09-06 – 2022-09-11 (×5): 10 [IU] via SUBCUTANEOUS
  Filled 2022-09-05 (×7): qty 0.1
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
## 2022-09-05 NOTE — Progress Notes (Addendum)
TCTS DAILY ICU PROGRESS NOTE                   [address]            [place] [address]          [phone]   1 Day Post-Op Procedure(s) (LRB): CORONARY ARTERY BYPASS GRAFTING (CABG) X 3 USING LEFT INTERNAL MAMMARY AND BILATERAL LEG GREATER SAPHENOUS VEIN VARVESTED ENDOSCOPICALLY (N/A) TRANSESOPHAGEAL ECHOCARDIOGRAM (TEE) (N/A)  Total Length of Stay:  LOS: 1 day   Subjective: Complains of incisional pain  Objective: Vital signs in last 24 hours: Temp:  [98.2 F (36.8 C)-99.7 F (37.6 C)] 99.3 F (37.4 C) (10/31 0700) Pulse Rate:  [61-86] 68 (10/31 0700) Cardiac Rhythm: Normal sinus rhythm (10/31 0000) Resp:  [9-24] 16 (10/31 0700) BP: (76-143)/(47-111) 142/111 (10/31 0700) SpO2:  [89 %-100 %] 94 % (10/31 0700) Arterial Line BP: (86-189)/(35-99) 174/56 (10/31 0700) FiO2 (%):  [40 %-50 %] 40 % (10/30 2056) Weight:  [49.6 kg-55.3 kg] 55.3 kg (10/31 0500)  Filed Weights   09/04/22 0552 09/04/22 1415 09/05/22 0500  Weight: 49.6 kg 49.6 kg 55.3 kg    Weight change: 0.022 kg   Hemodynamic parameters for last 24 hours: CVP:  [7 mmHg-37 mmHg] 16 mmHg CO:  [3.7 L/min-4.7 L/min] 4.7 L/min  Intake/Output from previous day: 10/30 0701 - 10/31 0700 In: 4753.7 [I.V.:3641.7; Blood:250; IV Piggyback:862] Out: 1945 [Urine:850; Blood:625; Chest Tube:470]  Intake/Output this shift: No intake/output data recorded.  Current Meds: Scheduled Meds:  acetaminophen  1,000 mg Oral Q6H   Or   acetaminophen (TYLENOL) oral liquid 160 mg/5 mL  1,000 mg Per Tube Q6H   aspirin EC  325 mg Oral Daily   Or   aspirin  324 mg Per Tube Daily   bisacodyl  10 mg Oral Daily   Or   bisacodyl  10 mg Rectal Daily   busPIRone  7.5 mg Oral BID   Chlorhexidine Gluconate Cloth  6 each Topical Daily   docusate sodium  200 mg Oral Daily   metoprolol tartrate  12.5 mg Oral BID   Or   metoprolol tartrate  12.5 mg Per Tube BID   [START ON 09/06/2022] pantoprazole  40 mg Oral Daily    sodium chloride flush  3 mL Intravenous Q12H   venlafaxine XR  75 mg Oral Q breakfast   Continuous Infusions:  sodium chloride 20 mL/hr at 09/05/22 0700   sodium chloride     sodium chloride     albumin human Stopped (09/04/22 1901)    ceFAZolin (ANCEF) IV Stopped (09/05/22 0622)   dexmedetomidine (PRECEDEX) IV infusion Stopped (09/04/22 1651)   DOBUTamine     insulin 0.4 Units/hr (09/05/22 0700)   lactated ringers     lactated ringers 20 mL/hr at 09/05/22 0700   lactated ringers 20 mL/hr at 09/05/22 0700   niCARDipine 3 mg/hr (09/05/22 0700)   norepinephrine (LEVOPHED) Adult infusion Stopped (09/04/22 1709)   phenylephrine (NEO-SYNEPHRINE) Adult infusion     PRN Meds:.sodium chloride, albumin human, dextrose, lactated ringers, metoprolol tartrate, midazolam, morphine injection, ondansetron (ZOFRAN) IV, mouth rinse, oxyCODONE, sodium chloride flush, traMADol  General appearance: alert, cooperative, distracted, and no distress Heart: regular rate and rhythm and loud rub with chest tubes in place Lungs: Mildly diminished in the left base Abdomen: Soft, nontender, nondistended Extremities: No lower extremity edema Wound: Dressings clean dry and intact  Lab Results: CBC: Recent Labs    09/04/22 2004 09/04/22  2124 09/04/22 2234 09/05/22 0308  WBC 12.5*  --   --  11.5*  HGB 9.3*   < > 9.5* 9.1*  HCT 27.9*   < > 28.0* 27.2*  PLT 250  --   --  241   < > = values in this interval not displayed.   BMET:  Recent Labs    09/04/22 2004 09/04/22 2124 09/04/22 2234 09/05/22 0308  NA 140   < > 141 142  K 4.6   < > 4.4 4.2  CL 114*  --   --  112*  CO2 22  --   --  22  GLUCOSE 163*  --   --  119*  BUN 21  --   --  24*  CREATININE 1.28*  --   --  1.45*  CALCIUM 8.4*  --   --  8.5*   < > = values in this interval not displayed.    CMET: Lab Results  Component Value Date   WBC 11.5 (H) 09/05/2022   HGB 9.1 (L) 09/05/2022   HCT 27.2 (L) 09/05/2022   PLT 241 09/05/2022    GLUCOSE 119 (H) 09/05/2022   CHOL 229 (H) 08/07/2022   TRIG 307.0 (H) 08/07/2022   HDL 49.00 08/07/2022   LDLDIRECT 154.0 08/07/2022   ALT 15 09/01/2022   AST 22 09/01/2022   NA 142 09/05/2022   K 4.2 09/05/2022   CL 112 (H) 09/05/2022   CREATININE 1.45 (H) 09/05/2022   BUN 24 (H) 09/05/2022   CO2 22 09/05/2022   TSH 4.30 07/04/2022   INR 1.4 (H) 09/04/2022   HGBA1C 6.9 (H) 09/01/2022      PT/INR:  Recent Labs    09/04/22 1420  LABPROT 16.7*  INR 1.4*   Radiology: ECHO INTRAOPERATIVE TEE  Result Date: 09/04/2022  *INTRAOPERATIVE TRANSESOPHAGEAL REPORT *  Patient Name:   Jasmine Keith Date of Exam: 09/04/2022 Medical Rec #:  [id number]          Height:       61.0 in Accession #:    [id number]         Weight:       109.3 lb Date of Birth:  1951-10-21         BSA:          1.46 m Patient Age:    71 years           BP:           13/59 mmHg Patient Gender: F                  HR:           72 bpm. Exam Location:  Anesthesiology Transesophogeal exam was perform intraoperatively during surgical procedure. Patient was closely monitored under general anesthesia during the entirety of examination. Indications:     Coronary Artery Disease Sonographer:     Bernadene Person RDCS Performing Phys: [id number] Lucile Crater Xandra Laramee Diagnosing Phys: Hoy Morn MD Complications: No known complications during this procedure. POST-OP IMPRESSIONS _ Left Ventricle: has hyperdynamic systolic function. The cavity size was normal. The wall motion is normal. _ Right Ventricle: The right ventricle appears unchanged from pre-bypass. _ Aorta: The aorta appears unchanged from pre-bypass. _ Left Atrial Appendage: The left atrial appendage appears unchanged from pre-bypass. _ Aortic Valve: The aortic valve appears unchanged from pre-bypass. _ Mitral Valve: There is mild regurgitation. _ Tricuspid Valve: The tricuspid valve appears unchanged from pre-bypass. _ Pulmonic Valve:  The pulmonic valve appears unchanged from  pre-bypass. _ Interatrial Septum: The interatrial septum appears unchanged from pre-bypass. _ Pericardium: The pericardium appears unchanged from pre-bypass. _ Comments: Post-bypass images reviewed with surgeon. PRE-OP FINDINGS  Left Ventricle: The left ventricle has hyperdynamic systolic function, with an ejection fraction of >65%. The cavity size was normal. No evidence of left ventricular regional wall motion abnormalities. Concentric left ventricular hypertrophy. Right Ventricle: The right ventricle has normal systolic function. The cavity was normal. There is no increase in right ventricular wall thickness. Left Atrium: Left atrial size was normal in size. No left atrial/left atrial appendage thrombus was detected. Left atrial appendage velocity is reduced at less than 40 cm/s. Right Atrium: Right atrial size was normal in size. Interatrial Septum: No atrial level shunt detected by color flow Doppler. Pericardium: There is no evidence of pericardial effusion. Mitral Valve: The mitral valve is normal in structure. Mitral valve regurgitation is trivial by color flow Doppler. Tricuspid Valve: The tricuspid valve was normal in structure. Tricuspid valve regurgitation was not visualized by color flow Doppler. Aortic Valve: The aortic valve is tricuspid Aortic valve regurgitation was not visualized by color flow Doppler. Pulmonic Valve: The pulmonic valve was normal in structure. Pulmonic valve regurgitation is trivial by color flow Doppler. Aorta: The aortic root is normal in size and structure. There is evidence of plaque in the ascending aorta, aortic arch and descending aorta.  Hoy Morn MD Electronically signed by Hoy Morn MD Signature Date/Time: 09/04/2022/4:03:11 PM    Final    DG Chest Port 1 View  Result Date: 09/04/2022 CLINICAL DATA:  Status post coronary artery bypass surgery EXAM: PORTABLE CHEST 1 VIEW COMPARISON:  09/01/2022 FINDINGS: There is interval coronary bypass surgery. Transverse  diameter of heart is increased. Central pulmonary vessels are prominent. There are no signs of alveolar pulmonary edema. There is homogeneous opacity in the medial left lower lung field. Left chest tube is noted. There is a mediastinal drain. Tip of right IJ central venous catheter is seen in superior vena cava. Enteric tube is noted traversing the esophagus. Tip of endotracheal tube is 2 cm above the carina. Left lateral CP angle is indistinct. There is no pneumothorax. Left cervical rib is seen. IMPRESSION: Status post coronary bypass surgery. There is infiltrate in the medial left lower lung fields suggesting atelectasis. Possible small left pleural effusion. Support devices as described in the body of the report. Electronically Signed   By: Elmer Picker M.D.   On: 09/04/2022 14:49     Assessment/Plan: S/P Procedure(s) (LRB): CORONARY ARTERY BYPASS GRAFTING (CABG) X 3 USING LEFT INTERNAL MAMMARY AND BILATERAL LEG GREATER SAPHENOUS VEIN VARVESTED ENDOSCOPICALLY (N/A) TRANSESOPHAGEAL ECHOCARDIOGRAM (TEE) (N/A) POD #1  1 extubated using standard protocols, post extubation gas showed some acidosis and was given bicarbonate and incentive spirometry was encouraged 2 Tmax 99.7, stable hemodynamics with variable systolic blood pressure with arterial line range 80s - 180s, sinus rhythm, cuff not correlating with a line very well, only a bit of Cardene currently for drips 3 oxygen saturations are good on 5 L [place] 4 chest tube-470 cc, we will leave in place for now 5 fair urine output, did decrease overnight, weight approximately 5 kg greater than preop, creatinine up slightly to 1.45, will need some diuresis 6 blood sugar-adequate control, known diabetic, hemoglobin A1c 6.9 7 mild reactive leukocytosis but trend improving 8 expected acute blood loss anemia, appears to be stable 9 non specific lateral T wave changes, not significantly different 10  chest x-ray-left basilar atelectasis and small  effusion 11 routine pulmonary hygiene and cardiac rehab, routine progression    Odis Luster Pager [phone] 09/05/2022 7:14 AM  Agree with above POD1 progression  Wandell Scullion Bary Leriche

## 2022-09-05 NOTE — Progress Notes (Signed)
Patient became hypotensive with SBP ranging in the 170s, complaining of not feeling well  10 mg of IV hydralazine x1 given.  Restarted on amlodipine, continue to hold losartan considering slight rise in serum creatinine    Jacky Kindle, MD [facility] See Amion for pager If no response to pager, please call [phone] until 7pm After 7pm, Please call E-link [phone]

## 2022-09-05 NOTE — Anesthesia Postprocedure Evaluation (Signed)
Anesthesia Post Note  Patient: Jasmine Keith [facility]  Procedure(s) Performed: CORONARY ARTERY BYPASS GRAFTING (CABG) X 3 USING LEFT INTERNAL MAMMARY AND BILATERAL LEG GREATER SAPHENOUS VEIN VARVESTED ENDOSCOPICALLY (Chest) TRANSESOPHAGEAL ECHOCARDIOGRAM (TEE)     Patient location during evaluation: SICU Anesthesia Type: General Level of consciousness: sedated Pain management: pain level controlled Vital Signs Assessment: post-procedure vital signs reviewed and stable Respiratory status: patient remains intubated per anesthesia plan Cardiovascular status: stable Postop Assessment: no apparent nausea or vomiting Anesthetic complications: no   No notable events documented.  Last Vitals:  Vitals:   09/05/22 0645 09/05/22 0700  BP: (!) 125/54 (!) 142/111  Pulse: 66 68  Resp: 11 16  Temp: 37.5 C 37.4 C  SpO2: 95% 94%    Last Pain:  Vitals:   09/05/22 0528  TempSrc:   PainSc: [place]

## 2022-09-06 ENCOUNTER — Inpatient Hospital Stay (HOSPITAL_COMMUNITY): Payer: Medicare Other

## 2022-09-06 DIAGNOSIS — N170 Acute kidney failure with tubular necrosis: Secondary | ICD-10-CM | POA: Diagnosis not present

## 2022-09-06 DIAGNOSIS — Z951 Presence of aortocoronary bypass graft: Secondary | ICD-10-CM | POA: Diagnosis not present

## 2022-09-06 DIAGNOSIS — J9601 Acute respiratory failure with hypoxia: Secondary | ICD-10-CM

## 2022-09-06 DIAGNOSIS — I2510 Atherosclerotic heart disease of native coronary artery without angina pectoris: Secondary | ICD-10-CM | POA: Diagnosis not present

## 2022-09-06 LAB — BASIC METABOLIC PANEL
Anion gap: 8 (ref 5–15)
BUN: 24 mg/dL — ABNORMAL HIGH (ref 8–23)
CO2: 22 mmol/L (ref 22–32)
Calcium: 8.3 mg/dL — ABNORMAL LOW (ref 8.9–10.3)
Chloride: 108 mmol/L (ref 98–111)
Creatinine, Ser: 1.33 mg/dL — ABNORMAL HIGH (ref 0.44–1.00)
GFR, Estimated: 43 mL/min — ABNORMAL LOW (ref >60)
Glucose, Bld: 87 mg/dL (ref 70–99)
Potassium: 4.1 mmol/L (ref 3.5–5.1)
Sodium: 138 mmol/L (ref 135–145)

## 2022-09-06 LAB — CBC
HCT: 26.1 % — ABNORMAL LOW (ref 36.0–46.0)
Hemoglobin: 8.4 g/dL — ABNORMAL LOW (ref 12.0–15.0)
MCH: 27.2 pg (ref 26.0–34.0)
MCHC: 32.2 g/dL (ref 30.0–36.0)
MCV: 84.5 fL (ref 80.0–100.0)
Platelets: 206 10*3/uL (ref 150–400)
RBC: 3.09 MIL/uL — ABNORMAL LOW (ref 3.87–5.11)
RDW: 15.9 % — ABNORMAL HIGH (ref 11.5–15.5)
WBC: 11.6 10*3/uL — ABNORMAL HIGH (ref 4.0–10.5)
nRBC: 0 % (ref 0.0–0.2)

## 2022-09-06 LAB — GLUCOSE, CAPILLARY
Glucose-Capillary: 107 mg/dL — ABNORMAL HIGH (ref 70–99)
Glucose-Capillary: 68 mg/dL — ABNORMAL LOW (ref 70–99)
Glucose-Capillary: 70 mg/dL (ref 70–99)
Glucose-Capillary: 89 mg/dL (ref 70–99)
Glucose-Capillary: 93 mg/dL (ref 70–99)
Glucose-Capillary: 97 mg/dL (ref 70–99)
Glucose-Capillary: 98 mg/dL (ref 70–99)

## 2022-09-06 MED ORDER — SODIUM CHLORIDE 0.9% FLUSH: 3.0000 mL | INTRAVENOUS | Status: DC | PRN

## 2022-09-06 MED ORDER — FUROSEMIDE 40 MG PO TABS
40.0000 mg | ORAL_TABLET | Freq: Every day | ORAL | Status: DC
  Administered 2022-09-06: 40 mg via ORAL
  Filled 2022-09-06: qty 1

## 2022-09-06 MED ORDER — ~~LOC~~ CARDIAC SURGERY, PATIENT & FAMILY EDUCATION: Freq: Once | Status: AC

## 2022-09-06 MED ORDER — SODIUM CHLORIDE 0.9 % IV SOLN: 250.0000 mL | INTRAVENOUS | Status: DC | PRN

## 2022-09-06 MED ORDER — LEVOTHYROXINE SODIUM 25 MCG PO TABS
25.0000 ug | ORAL_TABLET | Freq: Every day | ORAL | Status: DC
  Administered 2022-09-08 – 2022-09-17 (×10): 25 ug via ORAL
  Filled 2022-09-06 (×11): qty 1

## 2022-09-06 MED ORDER — IPRATROPIUM-ALBUTEROL 0.5-2.5 (3) MG/3ML IN SOLN
3.0000 mL | Freq: Four times a day (QID) | RESPIRATORY_TRACT | Status: DC
  Administered 2022-09-06: 3 mL via RESPIRATORY_TRACT
  Filled 2022-09-06: qty 3

## 2022-09-06 MED ORDER — POTASSIUM CHLORIDE CRYS ER 20 MEQ PO TBCR
20.0000 meq | EXTENDED_RELEASE_TABLET | Freq: Every day | ORAL | Status: DC
  Administered 2022-09-06: 20 meq via ORAL
  Filled 2022-09-06: qty 1

## 2022-09-06 MED ORDER — IPRATROPIUM-ALBUTEROL 0.5-2.5 (3) MG/3ML IN SOLN
3.0000 mL | Freq: Four times a day (QID) | RESPIRATORY_TRACT | Status: DC | PRN
  Administered 2022-09-07: 3 mL via RESPIRATORY_TRACT
  Filled 2022-09-06: qty 3

## 2022-09-06 MED ORDER — HYDRALAZINE HCL 25 MG PO TABS
25.0000 mg | ORAL_TABLET | Freq: Three times a day (TID) | ORAL | Status: DC
  Administered 2022-09-06 – 2022-09-07 (×4): 25 mg via ORAL
  Filled 2022-09-06 (×4): qty 1

## 2022-09-06 MED ORDER — SODIUM CHLORIDE 0.9% FLUSH
3.0000 mL | Freq: Two times a day (BID) | INTRAVENOUS | Status: DC
  Administered 2022-09-06 – 2022-09-17 (×17): 3 mL via INTRAVENOUS

## 2022-09-06 MED ORDER — METOPROLOL TARTRATE 25 MG PO TABS
25.0000 mg | ORAL_TABLET | Freq: Two times a day (BID) | ORAL | Status: DC
  Administered 2022-09-06 – 2022-09-09 (×7): 25 mg via ORAL
  Filled 2022-09-06 (×7): qty 1

## 2022-09-06 NOTE — Progress Notes (Addendum)
[address]       [place] [address]             [phone]     2 Days Post-Op Procedure(s) (LRB): CORONARY ARTERY BYPASS GRAFTING (CABG) X 3 USING LEFT INTERNAL MAMMARY AND BILATERAL LEG GREATER SAPHENOUS VEIN VARVESTED ENDOSCOPICALLY (N/A) TRANSESOPHAGEAL ECHOCARDIOGRAM (TEE) (N/A) Subjective: Feels ok, more confused today  Objective: Vital signs in last 24 hours: Temp:  [98.4 F (36.9 C)-100 F (37.8 C)] 98.4 F (36.9 C) (11/01 0645) Pulse Rate:  [57-78] 70 (11/01 0700) Cardiac Rhythm: Normal sinus rhythm (11/01 0000) Resp:  [12-32] 17 (11/01 0700) BP: (121-174)/(46-112) 147/68 (11/01 0700) SpO2:  [86 %-96 %] 93 % (11/01 0700) Arterial Line BP: (170)/(59) 170/59 (10/31 0800) Weight:  [56.1 kg] 56.1 kg (11/01 0330)  Hemodynamic parameters for last 24 hours: CVP:  [21 mmHg] 21 mmHg  Intake/Output from previous day: 10/31 0701 - 11/01 0700 In: 1132.6 [P.O.:840; I.V.:192.6; IV Piggyback:100] Out: 815 [Urine:685; Chest Tube:130] Intake/Output this shift: No intake/output data recorded.  General appearance: alert, cooperative, distracted, and slowed mentation Neurologic: More confused today, but is reoriented fairly easily Heart: regular rate and rhythm Lungs: Coarse with scattered rhonchi Abdomen: Benign Extremities: No edema Wound: Dressings clean dry and intact  Lab Results: Recent Labs    09/05/22 1739 09/06/22 0518  WBC 10.3 11.6*  HGB 8.9* 8.4*  HCT 26.5* 26.1*  PLT 221 206   BMET:  Recent Labs    09/05/22 1739 09/06/22 0518  NA 140 138  K 4.3 4.1  CL 106 108  CO2 24 22  GLUCOSE 119* 87  BUN 24* 24*  CREATININE 1.37* 1.33*  CALCIUM 8.7* 8.3*    PT/INR:  Recent Labs    09/04/22 1420  LABPROT 16.7*  INR 1.4*   ABG    Component Value Date/Time   PHART 7.341 (L) 09/05/2022 0811   HCO3 25.1 09/05/2022 0811   TCO2 27 09/05/2022 0811   ACIDBASEDEF 1.0 09/05/2022 0811   O2SAT 100 09/05/2022 0811   CBG (last 3)   Recent Labs    09/06/22 0047 09/06/22 0514 09/06/22 0640  GLUCAP 97 89 107*    Meds Scheduled Meds:  acetaminophen  1,000 mg Oral Q6H   Or   acetaminophen (TYLENOL) oral liquid 160 mg/5 mL  1,000 mg Per Tube Q6H   amLODipine  10 mg Oral Daily   aspirin EC  325 mg Oral Daily   Or   aspirin  324 mg Per Tube Daily   bisacodyl  10 mg Oral Daily   Or   bisacodyl  10 mg Rectal Daily   busPIRone  7.5 mg Oral BID   Chlorhexidine Gluconate Cloth  6 each Topical Daily   docusate sodium  200 mg Oral Daily   enoxaparin (LOVENOX) injection  40 mg Subcutaneous QHS   insulin aspart  0-24 Units Subcutaneous Q4H   insulin detemir  10 Units Subcutaneous Daily   metoprolol tartrate  12.5 mg Oral BID   Or   metoprolol tartrate  12.5 mg Per Tube BID   pantoprazole  40 mg Oral Daily   sodium chloride flush  10-40 mL Intracatheter Q12H   sodium chloride flush  3 mL Intravenous Q12H   venlafaxine XR  75 mg Oral Q breakfast   Continuous Infusions:  sodium chloride Stopped (09/05/22 1110)   sodium chloride     sodium chloride     insulin Stopped (09/05/22 1113)   lactated  ringers     lactated ringers Stopped (09/05/22 0729)   lactated ringers Stopped (09/05/22 0841)   PRN Meds:.sodium chloride, dextrose, hydrALAZINE, lactated ringers, metoprolol tartrate, midazolam, morphine injection, ondansetron (ZOFRAN) IV, mouth rinse, oxyCODONE, sodium chloride flush, sodium chloride flush, traMADol  Xrays DG Chest Port 1 View  Result Date: 09/05/2022 CLINICAL DATA:  Status post CABG EXAM: PORTABLE CHEST 1 VIEW COMPARISON:  Chest radiograph 09/04/2022 FINDINGS: Interval extubation and removal of the enteric tube. Right-sided central venous catheter and sheath remain. Mediastinal drain and left-sided thoracostomy tube are in place. Sternotomy wires are intact. Epicardial pacing leads in place. Compared to prior exam there is interval decrease in aeration of the left lung base. There is a hazy opacity at  the left lung base which could represent a combination of a layering pleural effusion and atelectasis, but superimposed infection is difficult to radiographically exclude. There also prominent interstitial opacities which likely represent mild pulmonary edema, unchanged from prior exam. Visualized upper abdomen is unremarkable. No new displaced rib fractures. Degenerative changes at the bilateral [facility] and glenohumeral joints. IMPRESSION: 1. Interval extubation and removal of the enteric tube. 2. Increased hazy opacity at the left lung base which could represent a combination of a layering pleural effusion and atelectasis, but superimposed infection is difficult to radiographically exclude. 3. Stable mild pulmonary edema. Electronically Signed   By: Marin Roberts M.D.   On: 09/05/2022 08:04   DG Chest Port 1 View  Result Date: 09/04/2022 CLINICAL DATA:  Status post coronary artery bypass surgery EXAM: PORTABLE CHEST 1 VIEW COMPARISON:  09/01/2022 FINDINGS: There is interval coronary bypass surgery. Transverse diameter of heart is increased. Central pulmonary vessels are prominent. There are no signs of alveolar pulmonary edema. There is homogeneous opacity in the medial left lower lung field. Left chest tube is noted. There is a mediastinal drain. Tip of right IJ central venous catheter is seen in superior vena cava. Enteric tube is noted traversing the esophagus. Tip of endotracheal tube is 2 cm above the carina. Left lateral CP angle is indistinct. There is no pneumothorax. Left cervical rib is seen. IMPRESSION: Status post coronary bypass surgery. There is infiltrate in the medial left lower lung fields suggesting atelectasis. Possible small left pleural effusion. Support devices as described in the body of the report. Electronically Signed   By: Elmer Picker M.D.   On: 09/04/2022 14:49    Assessment/Plan: S/P Procedure(s) (LRB): CORONARY ARTERY BYPASS GRAFTING (CABG) X 3 USING LEFT INTERNAL MAMMARY  AND BILATERAL LEG GREATER SAPHENOUS VEIN VARVESTED ENDOSCOPICALLY (N/A) TRANSESOPHAGEAL ECHOCARDIOGRAM (TEE) (N/A) POD #2  1 confused but re-orients fairly easily, limit pain meds as feasible, Tmax 100, SBP 120s to 170s, hydralazine was added, sinus rhythm 2 O2 sats have been low times, currently on 8 L nasal cannula- will add some nebs 3.  Urine output fair, weight up approximately 6 kg over preop 4 chest tube 130 cc-probably remove today 5 blood sugars, good control 6 creatinine improved to 1.33, we will begin diuresis 7 expected acute blood loss anemia is stable continue to monitor clinically, not in transfusion threshold 8 chest x-ray-bibasilar airspace disease left greater than right, vascular fullness 9 continue pulmonary hygiene and cardiac rehab as able.  We will keep in unit for now      LOS: 2 days    John Giovanni 09/06/2022  Agree with above Will remove wires, and drains Titrating BP meds 4E today  Nevaeha Finerty O Isiaih Hollenbach

## 2022-09-06 NOTE — Progress Notes (Signed)
POD 1 CABG  Doing well. Working to control incisional pain.     Latest Ref Rng & Units 09/05/2022    5:39 PM 09/05/2022    8:11 AM 09/05/2022    3:08 AM  CBC  WBC 4.0 - 10.5 K/uL 10.3   11.5   Hemoglobin 12.0 - 15.0 g/dL 8.9  13.3  9.1   Hematocrit 36.0 - 46.0 % 26.5  39.0  27.2   Platelets 150 - 400 K/uL 221   241       Latest Ref Rng & Units 09/05/2022    5:39 PM 09/05/2022    8:11 AM 09/05/2022    3:08 AM  CMP  Glucose 70 - 99 mg/dL 119   119   BUN 8 - 23 mg/dL 24   24   Creatinine 0.44 - 1.00 mg/dL 1.37   1.45   Sodium 135 - 145 mmol/L 140  142  142   Potassium 3.5 - 5.1 mmol/L 4.3  4.5  4.2   Chloride 98 - 111 mmol/L 106   112   CO2 22 - 32 mmol/L 24   22   Calcium 8.9 - 10.3 mg/dL 8.7   8.5     Intake/Output Summary (Last 24 hours) at 09/06/2022 0139 Last data filed at 09/05/2022 2245 Gross per 24 hour  Intake 1829.85 ml  Output 835 ml  Net 994.85 ml   I/O last 3 completed shifts: In: 5886.4 [P.O.:840; I.V.:3834.3; Blood:250; IV Piggyback:962] Out: 2230 [Urine:1075; Blood:625; Chest Tube:530]

## 2022-09-06 NOTE — Progress Notes (Signed)
Patient has been alert but very confused. Does not co-operate. Keeps taking her o2 off beside constant reminder. [place] is SOB, sats drops to 80's without o2. Pulling on dressing, she screams when checking sugar or taking vitals. Frequently gets up to use bathroom, does not call for help. Tried purwick but patient does not want to use it. Patient has no IV access, last sugar was 68, refusing to eat or drink. Refusing to take medication. Tried to place iv, but patient keeps screaming and refusing. Talked to pt's husband, he cannot drive at night to be with patient. On call MD notified. Plan of care continues.
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
## 2022-09-06 NOTE — Discharge Summary (Signed)
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 mg/dL    Comment: Glucose reference range applies only to samples taken after fasting for at least 8 hours.  Glucose, capillary     Status: Abnormal   Collection Time: 09/16/22  4:41 PM  Result Value Ref Range   Glucose-Capillary 154 (H) 70 - 99 mg/dL    Comment: Glucose reference range applies only to samples taken after fasting for at least 8 hours.  Glucose, capillary     Status: Abnormal   Collection Time: 09/16/22  9:16 PM  Result  Value Ref Range   Glucose-Capillary 126 (H) 70 - 99 mg/dL    Comment: Glucose reference range applies only to samples taken after fasting for at least 8 hours.   Comment 1 Notify RN    Comment 2 Document in Chart   Glucose, capillary     Status: Abnormal   Collection Time: 09/17/22  6:26 AM  Result Value Ref Range   Glucose-Capillary 137 (H) 70 - 99 mg/dL    Comment:  Glucose reference range applies only to samples taken after fasting for at least 8 hours.  Glucose, capillary     Status: Abnormal   Collection Time: 09/17/22  8:06 AM  Result Value Ref Range   Glucose-Capillary 115 (H) 70 - 99 mg/dL    Comment: Glucose reference range applies only to samples taken after fasting for at least 8 hours.   Comment 1 Notify RN     Treatments: surgery:    09/04/2022 Patient:  Jasmine [facility] Pre-Op Dx: 3V CAD HTN HLP DM   Post-op Dx:  same Procedure: CABG X 3.  LIMA LAD, RSVG OM, PDA   Endoscopic greater saphenous vein harvest on the right and left     Surgeon and Role:      * Lightfoot, Lucile Crater, MD - Primary    * B. Stehler , PA-C - assisting  Discharge Exam: Blood pressure (!) 147/60, pulse 61, temperature 98.8 F (37.1 C), temperature source Oral, resp. rate 18, height _0  (1.549 m), weight 53.5 kg, SpO2 95 %.  General appearance: alert, cooperative, and no distress Heart: regular rate and rhythm Lungs: clear to auscultation bilaterally Abdomen: soft, non-tender; bowel sounds normal; no masses,  no organomegaly Extremities: extremities normal, atraumatic, no cyanosis or edema Wound: clean and dry, no evidence of infection  Discharge Medications:  The patient has been discharged on:   1.Beta Blocker:  Yes [  x ]                              No   [   ]                              If No, reason:  2.Ace Inhibitor/ARB: Yes [   x]                                     No  [    ]                                     If No, reason:  3.Statin:   Yes [   ]                  No  [  x ]                  If No, reason:Myalgias  4.[place]:  Yes  [  x ]                  No   [   ]                  If No, reason:  Patient had ACS upon admission:  Plavix/P2Y12 inhibitor:  Yes [   ]                                      No  [  x ]     Discharge Instructions     AMB Referral to Advanced Lipid Disorders Clinic   Complete by: As directed     Internal Lipid Clinic Referral Scheduling  Internal lipid clinic referrals are providers within [facility], who wish to refer established patients for routine management (help in starting PCSK9 inhibitor therapy) or advanced therapies.  Internal MD referral criteria:              1. All patients with LDL>190 mg/dL  2. All patients with Triglycerides >500 mg/dL  3. Patients with suspected or confirmed heterozygous familial hyperlipidemia (HeFH) or homozygous familial hyperlipidemia (HoFH)  4. Patients with family history of suspicious for genetic dyslipidemia desiring genetic testing  5. Patients refractory to standard guideline based therapy  6. Patients with statin intolerance (failed 2 statins, one of which must be a high potency statin)  7. Patients who the provider desires to be seen by MD   Internal PharmD referral criteria:   1. Follow-up patients for medication management  2. Follow-up for compliance monitoring  3. Patients for drug education  4. Patients with statin intolerance  5. PCSK9 inhibitor education and prior authorization approvals  6. Patients with triglycerides <500 mg/dL  External Lipid Clinic Referral  External lipid clinic referrals are for providers outside of [facility], considered new clinic patients - automatically routed to MD schedule   Amb Referral to Cardiac Rehabilitation   Complete by: As directed    Diagnosis: CABG   CABG X ___: 3   After initial evaluation and assessments completed: Virtual Based Care may be provided alone or in conjunction with Phase 2 Cardiac Rehab based on patient barriers.: Yes   Intensive Cardiac Rehabilitation (ICR) [place] location only OR Traditional Cardiac Rehabilitation (TCR) *If criteria for ICR are not met will enroll in TCR [facility] only): Yes      Allergies as of 09/17/2022       Reactions   Lipitor [atorvastatin]    myalgias   Lovastatin    myalgias   Rosuvastatin    myalgia        Medication List     STOP  taking these medications    ciprofloxacin 500 MG tablet Commonly known as: CIPRO   Livalo 2 MG Tabs Generic drug: Pitavastatin Calcium   losartan 100 MG tablet Commonly known as: COZAAR   Magnesium 250 MG Tabs   metoprolol succinate 50 MG 24 hr tablet Commonly known as: TOPROL-XL   nitroGLYCERIN 0.4 MG SL tablet Commonly known as: NITROSTAT   ondansetron 4 MG disintegrating tablet Commonly known as: ZOFRAN-ODT       TAKE these medications    acetaminophen 325 MG tablet Commonly known as: TYLENOL Take 2 tablets (650 mg total) by mouth every 6 (six) hours as needed for mild pain (or Fever >/= 101).   alendronate 70 MG tablet Commonly known as: FOSAMAX TAKE 1 TABLET BY MOUTH ONCE A WEEK. TAKE WITH A FULL GLASS OF WATER ON AN EMPTY STOMACH.   amLODipine 10 MG tablet Commonly known as: NORVASC TAKE 1 TABLET BY MOUTH EVERY DAY   aspirin EC 325 MG tablet Take 1 tablet (325 mg total) by mouth daily. What changed:  medication strength how  much to take additional instructions   BD Pen Needle Nano U/F 32G X 4 MM Misc Generic drug: Insulin Pen Needle Use one needle daily to inject insulin   busPIRone 7.5 MG tablet Commonly known as: BUSPAR Take 7.5 mg by mouth 2 (two) times daily. What changed: Another medication with the same name was removed. Continue taking this medication, and follow the directions you see here.   carvedilol 25 MG tablet Commonly known as: COREG Take 1 tablet (25 mg total) by mouth 2 (two) times daily with a meal.   cloNIDine 0.1 MG tablet Commonly known as: CATAPRES Take 1 tablet (0.1 mg total) by mouth 2 (two) times daily.   dicyclomine 10 MG capsule Commonly known as: BENTYL Take 10 mg by mouth 4 (four) times daily -  before meals and at bedtime.   fenofibrate 160 MG tablet TAKE 1 TABLET BY MOUTH EVERY DAY   Gabapentin 50 MG Tabs Take 300 mg by mouth 2 (two) times daily. What changed:  medication strength how much to take    hydrALAZINE 100 MG tablet Commonly known as: APRESOLINE Take 1 tablet (100 mg total) by mouth 3 (three) times daily. What changed:  medication strength how much to take   insulin detemir 100 UNIT/ML injection Commonly known as: LEVEMIR Inject 0.1 mLs (10 Units total) into the skin at bedtime. What changed: how much to take   levothyroxine 25 MCG tablet Commonly known as: SYNTHROID TAKE 1 TABLET BY MOUTH EVERY DAY BEFORE BREAKFAST   meclizine 25 MG tablet Commonly known as: ANTIVERT Take 1 tablet (25 mg total) by mouth 3 (three) times daily as needed for dizziness.   metFORMIN 1000 MG tablet Commonly known as: GLUCOPHAGE Take 1 tablet (1,000 mg total) by mouth 2 (two) times daily with a meal.   oxyCODONE 5 MG immediate release tablet Commonly known as: Oxy IR/ROXICODONE Take 1 tablet (5 mg total) by mouth every 4 (four) hours as needed for severe pain.   pantoprazole 40 MG tablet Commonly known as: PROTONIX TAKE 1 TABLET BY MOUTH EVERY DAY   sacubitril-valsartan 49-51 MG Commonly known as: ENTRESTO Take 1 tablet by mouth 2 (two) times daily.   venlafaxine XR 75 MG 24 hr capsule Commonly known as: EFFEXOR-XR TAKE 1 CAPSULE BY MOUTH DAILY WITH BREAKFAST.   Vitamin D (Ergocalciferol) 1.25 MG (50000 UNIT) Caps capsule Commonly known as: DRISDOL TAKE 1 CAPSULE BY MOUTH ONCE A WEEK        Follow-up Information     Lightfoot, Lucile Crater, MD Follow up.   Specialty: Cardiothoracic Surgery Why: Please see discharge paperwork for details of follow-up appointment with surgeon. Contact information: [address] [place] [address] [phone]         Jenean Lindau, MD Follow up on 10/02/2022.   Specialty: Cardiology Why: 10 am for your cardiology appoijntment Contact information: [place] [address] [phone]                 Signed:  Ellwood Handler, PA-C  09/17/2022, 8:59 AM
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
## 2022-09-06 NOTE — Progress Notes (Signed)
Pt arrived to 4e from 2h. Vitals obtained. Telemetry box applied and CCMD notified x2 verifiers. CHG bath done. Family at bedside.

## 2022-09-06 NOTE — Progress Notes (Signed)
Attempted to start PIV w/ 2 RNs at bedside holding pt. , pt. Is alert but confused screaming and uncooperative. Declined PIV start.

## 2022-09-06 NOTE — Plan of Care (Signed)

## 2022-09-07 ENCOUNTER — Inpatient Hospital Stay (HOSPITAL_COMMUNITY): Payer: Medicare Other

## 2022-09-07 LAB — GLUCOSE, CAPILLARY
Glucose-Capillary: 239 mg/dL — ABNORMAL HIGH (ref 70–99)
Glucose-Capillary: 119 mg/dL — ABNORMAL HIGH (ref 70–99)
Glucose-Capillary: 100 mg/dL — ABNORMAL HIGH (ref 70–99)
Glucose-Capillary: 92 mg/dL (ref 70–99)
Glucose-Capillary: 127 mg/dL — ABNORMAL HIGH (ref 70–99)
Glucose-Capillary: 126 mg/dL — ABNORMAL HIGH (ref 70–99)
Glucose-Capillary: 159 mg/dL — ABNORMAL HIGH (ref 70–99)

## 2022-09-07 LAB — CBC
HCT: 29.8 % — ABNORMAL LOW (ref 36.0–46.0)
Hemoglobin: 10.3 g/dL — ABNORMAL LOW (ref 12.0–15.0)
MCH: 28 pg (ref 26.0–34.0)
MCHC: 34.6 g/dL (ref 30.0–36.0)
MCV: 81 fL (ref 80.0–100.0)
Platelets: 235 10*3/uL (ref 150–400)
RBC: 3.68 MIL/uL — ABNORMAL LOW (ref 3.87–5.11)
RDW: 15.1 % (ref 11.5–15.5)
WBC: 12.8 10*3/uL — ABNORMAL HIGH (ref 4.0–10.5)
nRBC: 0 % (ref 0.0–0.2)

## 2022-09-07 LAB — POCT I-STAT 7, (LYTES, BLD GAS, ICA,H+H)
Acid-base deficit: 3 mmol/L — ABNORMAL HIGH (ref 0.0–2.0)
Bicarbonate: 19.7 mmol/L — ABNORMAL LOW (ref 20.0–28.0)
Calcium, Ion: 1.19 mmol/L (ref 1.15–1.40)
HCT: 29 % — ABNORMAL LOW (ref 36.0–46.0)
Hemoglobin: 9.9 g/dL — ABNORMAL LOW (ref 12.0–15.0)
O2 Saturation: 93 %
Patient temperature: 98.1
Potassium: 3.2 mmol/L — ABNORMAL LOW (ref 3.5–5.1)
Sodium: 135 mmol/L (ref 135–145)
TCO2: 20 mmol/L — ABNORMAL LOW (ref 22–32)
pCO2 arterial: 25 mmHg — ABNORMAL LOW (ref 32–48)
pH, Arterial: 7.503 — ABNORMAL HIGH (ref 7.35–7.45)
pO2, Arterial: 57 mmHg — ABNORMAL LOW (ref 83–108)

## 2022-09-07 LAB — BASIC METABOLIC PANEL
Anion gap: 10 (ref 5–15)
BUN: 23 mg/dL (ref 8–23)
CO2: 24 mmol/L (ref 22–32)
Calcium: 9.2 mg/dL (ref 8.9–10.3)
Chloride: 103 mmol/L (ref 98–111)
Creatinine, Ser: 1.22 mg/dL — ABNORMAL HIGH (ref 0.44–1.00)
GFR, Estimated: 48 mL/min — ABNORMAL LOW (ref >60)
Glucose, Bld: 173 mg/dL — ABNORMAL HIGH (ref 70–99)
Potassium: 3.6 mmol/L (ref 3.5–5.1)
Sodium: 137 mmol/L (ref 135–145)

## 2022-09-07 MED ORDER — LOSARTAN POTASSIUM 50 MG PO TABS
50.0000 mg | ORAL_TABLET | Freq: Every day | ORAL | Status: DC
  Administered 2022-09-07 – 2022-09-13 (×7): 50 mg via ORAL
  Filled 2022-09-07 (×8): qty 1

## 2022-09-07 MED ORDER — FUROSEMIDE 10 MG/ML IJ SOLN
40.0000 mg | Freq: Two times a day (BID) | INTRAMUSCULAR | Status: DC
  Administered 2022-09-07 – 2022-09-08 (×3): 40 mg via INTRAVENOUS
  Filled 2022-09-07 (×3): qty 4

## 2022-09-07 MED ORDER — NICARDIPINE HCL IN NACL 20-0.86 MG/200ML-% IV SOLN
3.0000 mg/h | INTRAVENOUS | Status: DC
  Administered 2022-09-07: 5 mg/h via INTRAVENOUS
  Administered 2022-09-07 (×2): 8 mg/h via INTRAVENOUS
  Administered 2022-09-08: 3.5 mg/h via INTRAVENOUS
  Administered 2022-09-08 (×2): 15 mg/h via INTRAVENOUS
  Administered 2022-09-08: 10 mg/h via INTRAVENOUS
  Administered 2022-09-09: 5 mg/h via INTRAVENOUS
  Administered 2022-09-09: 15 mg/h via INTRAVENOUS
  Administered 2022-09-09 – 2022-09-10 (×5): 5 mg/h via INTRAVENOUS
  Administered 2022-09-10 (×2): 15 mg/h via INTRAVENOUS
  Administered 2022-09-10: 5 mg/h via INTRAVENOUS
  Administered 2022-09-10 (×2): 12 mg/h via INTRAVENOUS
  Administered 2022-09-12: 2.5 mg/h via INTRAVENOUS
  Filled 2022-09-07 (×2): qty 200
  Filled 2022-09-07: qty 400
  Filled 2022-09-07 (×5): qty 200
  Filled 2022-09-07: qty 400
  Filled 2022-09-07 (×2): qty 200
  Filled 2022-09-07: qty 400
  Filled 2022-09-07 (×6): qty 200

## 2022-09-07 MED ORDER — DEXTROSE 50 % IV SOLN
12.5000 g | INTRAVENOUS | Status: AC
  Administered 2022-09-07: 12.5 g via INTRAVENOUS
  Filled 2022-09-07: qty 50

## 2022-09-07 MED ORDER — POTASSIUM CHLORIDE CRYS ER 20 MEQ PO TBCR
30.0000 meq | EXTENDED_RELEASE_TABLET | Freq: Two times a day (BID) | ORAL | Status: DC
  Administered 2022-09-07 – 2022-09-11 (×7): 30 meq via ORAL
  Filled 2022-09-07 (×7): qty 1

## 2022-09-07 MED ORDER — INSULIN ASPART 100 UNIT/ML IJ SOLN: 0.0000 [IU] | Freq: Three times a day (TID) | INTRAMUSCULAR | Status: DC

## 2022-09-07 MED ORDER — INSULIN ASPART 100 UNIT/ML IJ SOLN: 0.0000 [IU] | INTRAMUSCULAR | Status: DC

## 2022-09-07 MED ORDER — HYDRALAZINE HCL 20 MG/ML IJ SOLN
10.0000 mg | INTRAMUSCULAR | Status: AC
  Administered 2022-09-07: 10 mg via INTRAVENOUS
  Filled 2022-09-07: qty 1

## 2022-09-07 MED ORDER — INSULIN ASPART 100 UNIT/ML IJ SOLN
0.0000 [IU] | INTRAMUSCULAR | Status: DC
  Administered 2022-09-08: 8 [IU] via SUBCUTANEOUS
  Administered 2022-09-08: 4 [IU] via SUBCUTANEOUS
  Administered 2022-09-08 (×2): 2 [IU] via SUBCUTANEOUS

## 2022-09-07 NOTE — Significant Event (Signed)
Rapid Response Event Note   Reason for Call :  Confusion, tachypnea, and hypertension  Initial Focused Assessment:  Pt lying in bed with eyes open, moaning. She is alert to self, will follow some simple commands, and move all extremities. Pupils 6, equal, and reactive. Lungs clear, decreased in the bases. Skin warm and dry.   HR-82, BP-218/54(220/50 manually), RR-32, SpO2-97% on 4L [place].  Pt given '10mg'$  hydralazine IV at 0422, morphine '2mg'$  IV at 0522, and metoprolol '5mg'$  at 0549. Pt unable to take PO meds at this time d/t confusion.   Per RN, pt became very wheezy around 0240. She was given a duoneb at that time and her wheezing improved.     Interventions:  PCXR-CHF pattern.  No visible pneumothorax.  EKG-SR CBG-100 '10mg'$  hydralazine IV Plan of Care:  Confusion is not new, however, may be contributing some to her tachypnea and hypertension. Give hydralazine and monitor response. Continue to monitor pt closely. Call RRT if further assistance needed.   Event Summary:   MD Notified: Dr. Roxan Hockey notified by bedside RN Call [place]  Dillard Essex, RN

## 2022-09-07 NOTE — Inpatient Diabetes Management (Signed)
Inpatient Diabetes Program Recommendations  AACE/ADA: New Consensus Statement on Inpatient Glycemic Control (2015)  Target Ranges:  Prepandial:   less than 140 mg/dL      Peak postprandial:   less than 180 mg/dL (1-2 hours)      Critically ill patients:  140 - 180 mg/dL   Lab Results  Component Value Date   GLUCAP 92 09/07/2022   HGBA1C 6.9 (H) 09/01/2022    Review of Glycemic Control  Latest Reference Range & Units 09/06/22 05:14 09/06/22 06:40 09/06/22 11:33 09/06/22 17:10 09/06/22 20:18 09/06/22 23:18 09/07/22 01:08 09/07/22 04:01 09/07/22 06:10 09/07/22 07:58  Glucose-Capillary 70 - 99 mg/dL 89 107 (H) 93 98 70 68 (L) 239 (H) 119 (H) 100 (H) 92   Diabetes history: DM 2 Outpatient Diabetes medications: Levemir 15 units QHS, Metformin 1000 mg BID  Current orders for Inpatient glycemic control:  Levemir 10 units Daily Novolog 0-24 units Q4 hours  NOTE: hypoglycemia last night at 68 minimal insulin being given.  Inpatient Diabetes Program Recommendations:    -  reduce Novolog Correction to 0-9 units Q4 hours -  reduce Levemir to 8 units  Thanks,  Tama Headings RN, MSN, BC-ADM Inpatient Diabetes Coordinator Team Pager [phone] (8a-5p)

## 2022-09-07 NOTE — Progress Notes (Signed)
Pt kept taking her o2 off besides constant redirection, very much confused and rude. Taking vitals, checking CBG has been struggle since pt is not co-operating. Moaning in pain but refuses to take any medication. Bilateral mittens applied. Current o2 sat is 95% in 4lo2. IV d50 was administered after successful placement of IV by IV team. Recheck CBG 239. Bed alarm on. Plan of care continues.

## 2022-09-07 NOTE — Progress Notes (Signed)
Pt transferred to [facility] ICU, per MD order. Pt's BP remains elevated despite all efforts with PO and IV meds given. Report given to receiving nurse and all questions answered.

## 2022-09-07 NOTE — Progress Notes (Signed)
TCTS Progress Note:  CAB POD 3  Hypertension has been an issues  Is on nicardipine at 8  Supplemental 02 has varied 2-8L       Latest Ref Rng & Units 09/07/2022   12:56 PM 09/07/2022    1:27 AM 09/06/2022    5:18 AM  CBC  WBC 4.0 - 10.5 K/uL  12.8  11.6   Hemoglobin 12.0 - 15.0 g/dL 9.9  10.3  8.4   Hematocrit 36.0 - 46.0 % 29.0  29.8  26.1   Platelets 150 - 400 K/uL  235  206        Latest Ref Rng & Units 09/07/2022   12:56 PM 09/07/2022    1:27 AM 09/06/2022    5:18 AM  CMP  Glucose 70 - 99 mg/dL  173  87   BUN 8 - 23 mg/dL  23  24   Creatinine 0.44 - 1.00 mg/dL  1.22  1.33   Sodium 135 - 145 mmol/L 135  137  138   Potassium 3.5 - 5.1 mmol/L 3.2  3.6  4.1   Chloride 98 - 111 mmol/L  103  108   CO2 22 - 32 mmol/L  24  22   Calcium 8.9 - 10.3 mg/dL  9.2  8.3     ABG    Component Value Date/Time   PHART 7.503 (H) 09/07/2022 1256   PCO2ART 25.0 (L) 09/07/2022 1256   PO2ART 57 (L) 09/07/2022 1256   HCO3 19.7 (L) 09/07/2022 1256   TCO2 20 (L) 09/07/2022 1256   ACIDBASEDEF 3.0 (H) 09/07/2022 1256   O2SAT 93 09/07/2022 1256

## 2022-09-07 NOTE — Progress Notes (Signed)
Pt kept getting up to urinate. Refused [place]. Has been up almost every 30 min . Became tachypnic and started wheezing. PRN breathing treatment given. Plan of care continues.

## 2022-09-07 NOTE — Progress Notes (Signed)
0422: Pt's SBP was in 200's. 227/61. PRN hydralazine administer. Came down to 218/54. Pt had labored breathing, using abdominal muscle to breath. Pt alert but confused, stated she was hurting. IV morphine administered. BP was still in 200's.  0530: Charge nurse notified.  0530: Rapid RN was notified.  7106: Rapid RN by bedside. Prn Metoprolol administered. Tried to give pt's scheduled PO hydralazine, pt refused. Took 2 sips of water. CBG was 100, 12 Lead EKG showed NSR. Stat chest x-ray ordered per rapid RN and completed. Pt's Bp was still 206/64. Pt is alert but confused. Keeps taking her o2 off.  0627: DR. Roxan Hockey called and notified of the situation, and updated on all prns I have given and Rapid RN being present at bedside. Received order for '10mg'$  iv hydralazine stat. Pt's Mews turned red. Documentation completed. Even after last dose of iv hydralazine, BP remains in 200's. Reported to day shift RN. [place] CT PA was by bedside, and aware of the event.Plan of care continues.
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
## 2022-09-08 ENCOUNTER — Ambulatory Visit: Payer: Medicare Other | Admitting: Cardiology

## 2022-09-08 ENCOUNTER — Inpatient Hospital Stay (HOSPITAL_COMMUNITY): Payer: Medicare Other

## 2022-09-08 LAB — TYPE AND SCREEN
ABO/RH(D): O POS
Antibody Screen: NEGATIVE
Unit division: 0
Unit division: 0
Unit division: 0
Unit division: 0
Unit division: 0
Unit division: 0

## 2022-09-08 LAB — CBC
HCT: 28.5 % — ABNORMAL LOW (ref 36.0–46.0)
Hemoglobin: 9.4 g/dL — ABNORMAL LOW (ref 12.0–15.0)
MCH: 26.9 pg (ref 26.0–34.0)
MCHC: 33 g/dL (ref 30.0–36.0)
MCV: 81.7 fL (ref 80.0–100.0)
Platelets: 309 10*3/uL (ref 150–400)
RBC: 3.49 MIL/uL — ABNORMAL LOW (ref 3.87–5.11)
RDW: 15.7 % — ABNORMAL HIGH (ref 11.5–15.5)
WBC: 10.5 10*3/uL (ref 4.0–10.5)
nRBC: 0 % (ref 0.0–0.2)

## 2022-09-08 LAB — GLUCOSE, CAPILLARY
Glucose-Capillary: 104 mg/dL — ABNORMAL HIGH (ref 70–99)
Glucose-Capillary: 149 mg/dL — ABNORMAL HIGH (ref 70–99)
Glucose-Capillary: 155 mg/dL — ABNORMAL HIGH (ref 70–99)
Glucose-Capillary: 188 mg/dL — ABNORMAL HIGH (ref 70–99)
Glucose-Capillary: 211 mg/dL — ABNORMAL HIGH (ref 70–99)
Glucose-Capillary: 79 mg/dL (ref 70–99)
Glucose-Capillary: 92 mg/dL (ref 70–99)

## 2022-09-08 LAB — BPAM RBC
Blood Product Expiration Date: 202311252359
Blood Product Expiration Date: 202311252359
Blood Product Expiration Date: 202311252359
Blood Product Expiration Date: 202311262359
Blood Product Expiration Date: 202311262359
Blood Product Expiration Date: 202311262359
ISSUE DATE / TIME: 202310300818
ISSUE DATE / TIME: 202310300818
ISSUE DATE / TIME: 202310301505
ISSUE DATE / TIME: 202310302113
Unit Type and Rh: 5100
Unit Type and Rh: 5100
Unit Type and Rh: 5100
Unit Type and Rh: 5100
Unit Type and Rh: 5100
Unit Type and Rh: 5100

## 2022-09-08 LAB — BASIC METABOLIC PANEL
Anion gap: 12 (ref 5–15)
BUN: 43 mg/dL — ABNORMAL HIGH (ref 8–23)
CO2: 19 mmol/L — ABNORMAL LOW (ref 22–32)
Calcium: 8.7 mg/dL — ABNORMAL LOW (ref 8.9–10.3)
Chloride: 107 mmol/L (ref 98–111)
Creatinine, Ser: 1.76 mg/dL — ABNORMAL HIGH (ref 0.44–1.00)
GFR, Estimated: 31 mL/min — ABNORMAL LOW (ref >60)
Glucose, Bld: 108 mg/dL — ABNORMAL HIGH (ref 70–99)
Potassium: 4 mmol/L (ref 3.5–5.1)
Sodium: 138 mmol/L (ref 135–145)

## 2022-09-08 MED ORDER — ENOXAPARIN SODIUM 30 MG/0.3ML IJ SOSY
30.0000 mg | PREFILLED_SYRINGE | Freq: Every day | INTRAMUSCULAR | Status: DC
  Administered 2022-09-08 – 2022-09-16 (×9): 30 mg via SUBCUTANEOUS
  Filled 2022-09-08 (×9): qty 0.3

## 2022-09-08 NOTE — Progress Notes (Signed)
      [address]       [place] [address]             [phone]                 4 Days Post-Op Procedure(s) (LRB): CORONARY ARTERY BYPASS GRAFTING (CABG) X 3 USING LEFT INTERNAL MAMMARY AND BILATERAL LEG GREATER SAPHENOUS VEIN VARVESTED ENDOSCOPICALLY (N/A) TRANSESOPHAGEAL ECHOCARDIOGRAM (TEE) (N/A)   Events: No events Mental status improved _______________________________________________________________ Vitals: BP (!) 135/55   Pulse 63   Temp 97.8 F (36.6 C)   Resp 14   Ht '5\' 1"'$  (1.549 m)   Wt 52.1 kg   SpO2 93%   BMI 21.70 kg/m  Filed Weights   09/06/22 0330 09/07/22 0830 09/08/22 0400  Weight: 56.1 kg 51.8 kg 52.1 kg     - Neuro: alert NAD  - Cardiovascular: sinus  Drips: cardene 5.      - Pulm: EWOB    ABG    Component Value Date/Time   PHART 7.503 (H) 09/07/2022 1256   PCO2ART 25.0 (L) 09/07/2022 1256   PO2ART 57 (L) 09/07/2022 1256   HCO3 19.7 (L) 09/07/2022 1256   TCO2 20 (L) 09/07/2022 1256   ACIDBASEDEF 3.0 (H) 09/07/2022 1256   O2SAT 93 09/07/2022 1256    - Abd: ND - Extremity: warm  .Intake/Output      11/02 0701 11/03 0700 11/03 0701 11/04 0700   P.Jasmine. 750    I.V. (mL/kg) 898.1 (17.2)    Total Intake(mL/kg) 1648.1 (31.6)    Urine (mL/kg/hr) 1075 (0.9)    Emesis/NG output 0    Stool 0    Total Output 1075    Net +573.1         Urine Occurrence 4 x    Stool Occurrence 1 x    Emesis Occurrence 0 x       _______________________________________________________________ Labs:    Latest Ref Rng & Units 09/08/2022    4:51 AM 09/07/2022   12:56 PM 09/07/2022    1:27 AM  CBC  WBC 4.0 - 10.5 K/uL 10.5   12.8   Hemoglobin 12.0 - 15.0 g/dL 9.4  9.9  10.3   Hematocrit 36.0 - 46.0 % 28.5  29.0  29.8   Platelets 150 - 400 K/uL 309   235       Latest Ref Rng & Units 09/08/2022    4:51 AM 09/07/2022   12:56 PM 09/07/2022    1:27 AM  CMP  Glucose 70 - 99 mg/dL 108   173   BUN 8 - 23 mg/dL 43   23   Creatinine  0.44 - 1.00 mg/dL 1.76   1.22   Sodium 135 - 145 mmol/L 138  135  137   Potassium 3.5 - 5.1 mmol/L 4.0  3.2  3.6   Chloride 98 - 111 mmol/L 107   103   CO2 22 - 32 mmol/L 19   24   Calcium 8.9 - 10.3 mg/dL 8.7   9.2     CXR: PV congestion  _______________________________________________________________  Assessment and Plan: POD 4 s/p CABG  Neuro: pain controlled.  Mental status improved CV: on cardene.  Will titrate as she starts taking more home meds Pulm: IS ambulation Renal: creat up, holding lasix for now GI: on diet Heme: stable ID: [email or web address] Endo: SSI Dispo: will keep in ICU for HTN and AKI   Jasmine Keith Jasmine Keith 09/08/2022 8:26 AM

## 2022-09-08 NOTE — Progress Notes (Signed)
      [address]       [place] [address]             [phone]      Resting comfortably  BP (!) 165/81   Pulse 66   Temp 97.9 F (36.6 C) (Oral)   Resp (!) 21   Ht '5\' 1"'$  (1.549 m)   Wt 52.1 kg   SpO2 91%   BMI 21.70 kg/m  Still on nicardipine drip   Intake/Output Summary (Last 24 hours) at 09/08/2022 1812 Last data filed at 09/08/2022 1800 Gross per 24 hour  Intake 1711.39 ml  Output 1200 ml  Net 511.39 ml   No issues with confusion per [employer] C. Roxan Hockey, MD [facility] [phone]

## 2022-09-09 ENCOUNTER — Other Ambulatory Visit: Payer: Self-pay | Admitting: Medical

## 2022-09-09 LAB — MAGNESIUM: Magnesium: 1.7 mg/dL (ref 1.7–2.4)

## 2022-09-09 LAB — CBC
HCT: 27.5 % — ABNORMAL LOW (ref 36.0–46.0)
Hemoglobin: 9.1 g/dL — ABNORMAL LOW (ref 12.0–15.0)
MCH: 27.1 pg (ref 26.0–34.0)
MCHC: 33.1 g/dL (ref 30.0–36.0)
MCV: 81.8 fL (ref 80.0–100.0)
Platelets: 302 10*3/uL (ref 150–400)
RBC: 3.36 MIL/uL — ABNORMAL LOW (ref 3.87–5.11)
RDW: 15.6 % — ABNORMAL HIGH (ref 11.5–15.5)
WBC: 9.4 10*3/uL (ref 4.0–10.5)
nRBC: 0 % (ref 0.0–0.2)

## 2022-09-09 LAB — BASIC METABOLIC PANEL
Anion gap: 12 (ref 5–15)
BUN: 41 mg/dL — ABNORMAL HIGH (ref 8–23)
CO2: 15 mmol/L — ABNORMAL LOW (ref 22–32)
Calcium: 7.9 mg/dL — ABNORMAL LOW (ref 8.9–10.3)
Chloride: 108 mmol/L (ref 98–111)
Creatinine, Ser: 1.51 mg/dL — ABNORMAL HIGH (ref 0.44–1.00)
GFR, Estimated: 37 mL/min — ABNORMAL LOW (ref >60)
Glucose, Bld: 182 mg/dL — ABNORMAL HIGH (ref 70–99)
Potassium: 4.8 mmol/L (ref 3.5–5.1)
Sodium: 135 mmol/L (ref 135–145)

## 2022-09-09 LAB — GLUCOSE, CAPILLARY
Glucose-Capillary: 185 mg/dL — ABNORMAL HIGH (ref 70–99)
Glucose-Capillary: 141 mg/dL — ABNORMAL HIGH (ref 70–99)
Glucose-Capillary: 134 mg/dL — ABNORMAL HIGH (ref 70–99)
Glucose-Capillary: 125 mg/dL — ABNORMAL HIGH (ref 70–99)

## 2022-09-09 MED ORDER — DICYCLOMINE HCL 10 MG PO CAPS
10.0000 mg | ORAL_CAPSULE | Freq: Three times a day (TID) | ORAL | Status: DC
  Administered 2022-09-09 – 2022-09-17 (×31): 10 mg via ORAL
  Filled 2022-09-09 (×35): qty 1

## 2022-09-09 MED ORDER — GABAPENTIN 300 MG PO CAPS
300.0000 mg | ORAL_CAPSULE | Freq: Two times a day (BID) | ORAL | Status: DC
  Filled 2022-09-09: qty 1

## 2022-09-09 MED ORDER — INSULIN ASPART 100 UNIT/ML IJ SOLN
0.0000 [IU] | Freq: Three times a day (TID) | INTRAMUSCULAR | Status: DC
  Administered 2022-09-09 (×2): 2 [IU] via SUBCUTANEOUS
  Administered 2022-09-09 – 2022-09-10 (×2): 4 [IU] via SUBCUTANEOUS
  Administered 2022-09-11: 8 [IU] via SUBCUTANEOUS

## 2022-09-09 MED ORDER — HYDRALAZINE HCL 25 MG PO TABS
25.0000 mg | ORAL_TABLET | Freq: Three times a day (TID) | ORAL | Status: DC
  Administered 2022-09-09 – 2022-09-12 (×10): 25 mg via ORAL
  Filled 2022-09-09 (×10): qty 1

## 2022-09-09 MED ORDER — GABAPENTIN 600 MG PO TABS
300.0000 mg | ORAL_TABLET | Freq: Two times a day (BID) | ORAL | Status: DC
  Administered 2022-09-09 – 2022-09-17 (×17): 300 mg via ORAL
  Filled 2022-09-09 (×17): qty 1

## 2022-09-09 MED ORDER — METOPROLOL TARTRATE 50 MG PO TABS
50.0000 mg | ORAL_TABLET | Freq: Two times a day (BID) | ORAL | Status: DC
  Administered 2022-09-09 – 2022-09-10 (×2): 50 mg via ORAL
  Filled 2022-09-09 (×2): qty 1

## 2022-09-09 NOTE — Progress Notes (Signed)
      [address]       [place] [address]             [phone]       No new issues  BP remains difficult to control  BP (!) 175/70   Pulse 72   Temp 98.4 F (36.9 C) (Oral)   Resp (!) 27   Ht '5\' 1"'$  (1.549 m)   Wt 50.9 kg   SpO2 97%   BMI 21.20 kg/m   Off Cardene drip  Will increase Lopressor to 50 mg BID  Remo Lipps C. Roxan Hockey, MD [facility] [phone]

## 2022-09-09 NOTE — Progress Notes (Signed)
5 Days Post-Op Procedure(s) (LRB): CORONARY ARTERY BYPASS GRAFTING (CABG) X 3 USING LEFT INTERNAL MAMMARY AND BILATERAL LEG GREATER SAPHENOUS VEIN VARVESTED ENDOSCOPICALLY (N/A) TRANSESOPHAGEAL ECHOCARDIOGRAM (TEE) (N/A) Subjective: Sleepy this morning, refused to get OOB Denies pain, says she is tired  Objective: Vital signs in last 24 hours: Temp:  [97.3 F (36.3 C)-98.6 F (37 C)] 98.6 F (37 C) (11/04 0815) Pulse Rate:  [55-79] 70 (11/04 1000) Cardiac Rhythm: Normal sinus rhythm (11/04 0348) Resp:  [15-37] 22 (11/04 1000) BP: (126-169)/(46-115) 156/55 (11/04 1000) SpO2:  [89 %-95 %] 94 % (11/04 1000) Weight:  [50.9 kg] 50.9 kg (11/04 0630)  Hemodynamic parameters for last 24 hours:    Intake/Output from previous day: 11/03 0701 - 11/04 0700 In: 1917.4 [I.V.:1917.4] Out: 1000 [Urine:1000] Intake/Output this shift: Total I/O In: 277.2 [I.V.:277.2] Out: -   General appearance: cooperative and no distress Neurologic: no focal weakness Heart: regular rate and rhythm Lungs: diminished breath sounds bibasilar Abdomen: normal findings: soft, non-tender Wound: clean and intact  Lab Results: Recent Labs    09/08/22 0451 09/09/22 0712  WBC 10.5 9.4  HGB 9.4* 9.1*  HCT 28.5* 27.5*  PLT 309 302   BMET:  Recent Labs    09/08/22 0451 09/09/22 0712  NA 138 135  K 4.0 4.8  CL 107 108  CO2 19* 15*  GLUCOSE 108* 182*  BUN 43* 41*  CREATININE 1.76* 1.51*  CALCIUM 8.7* 7.9*    PT/INR: No results for input(s): "LABPROT", "INR" in the last 72 hours. ABG    Component Value Date/Time   PHART 7.503 (H) 09/07/2022 1256   HCO3 19.7 (L) 09/07/2022 1256   TCO2 20 (L) 09/07/2022 1256   ACIDBASEDEF 3.0 (H) 09/07/2022 1256   O2SAT 93 09/07/2022 1256   CBG (last 3)  Recent Labs    09/08/22 2002 09/08/22 2314 09/09/22 0642  GLUCAP 188* 155* 185*    Assessment/Plan: S/P Procedure(s) (LRB): CORONARY ARTERY BYPASS GRAFTING (CABG) X 3 USING LEFT INTERNAL MAMMARY AND  BILATERAL LEG GREATER SAPHENOUS VEIN VARVESTED ENDOSCOPICALLY (N/A) TRANSESOPHAGEAL ECHOCARDIOGRAM (TEE) (N/A) - CV- in SR Hypertension remains problematic  On lopressor, amlodipine, Cozaar and Nicardipine drip + PRN hydralazine   Was on PO hydralazine at home- will restart  Wean Nicardipine drip as BP allows RESP- IS RENAL- creatinine down from 1.76 to 1.5 Gi - poor appetite  Resume Bentyl ENDO- CBG moderately elevated  On home dose of levemir  On metformin at home but creatinine up Deconditioning- severe, mobilize   LOS: 5 days    Melrose Nakayama 09/09/2022

## 2022-09-10 LAB — GLUCOSE, CAPILLARY
Glucose-Capillary: 186 mg/dL — ABNORMAL HIGH (ref 70–99)
Glucose-Capillary: 117 mg/dL — ABNORMAL HIGH (ref 70–99)
Glucose-Capillary: 110 mg/dL — ABNORMAL HIGH (ref 70–99)
Glucose-Capillary: 123 mg/dL — ABNORMAL HIGH (ref 70–99)

## 2022-09-10 MED ORDER — CLONIDINE HCL 0.2 MG PO TABS
0.2000 mg | ORAL_TABLET | Freq: Two times a day (BID) | ORAL | Status: DC
  Administered 2022-09-10 – 2022-09-13 (×8): 0.2 mg via ORAL
  Filled 2022-09-10: qty 1
  Filled 2022-09-10 (×2): qty 2
  Filled 2022-09-10: qty 1
  Filled 2022-09-10 (×4): qty 2

## 2022-09-10 MED ORDER — CLONIDINE HCL 0.1 MG PO TABS: 0.1000 mg | ORAL_TABLET | Freq: Two times a day (BID) | ORAL | Status: DC

## 2022-09-10 MED ORDER — METOPROLOL TARTRATE 25 MG PO TABS
25.0000 mg | ORAL_TABLET | Freq: Two times a day (BID) | ORAL | Status: DC
  Administered 2022-09-10 – 2022-09-12 (×3): 25 mg via ORAL
  Filled 2022-09-10 (×4): qty 1

## 2022-09-10 NOTE — Progress Notes (Signed)
      [address]       [place] [address]             [phone]      No complaints at present  BP (!) 173/64   Pulse 66   Temp 98.5 F (36.9 C) (Oral)   Resp (!) 29   Ht '5\' 1"'$  (1.549 m)   Wt 54.1 kg   SpO2 95%   BMI 22.54 kg/m   Intake/Output Summary (Last 24 hours) at 09/10/2022 1800 Last data filed at 09/10/2022 1700 Gross per 24 hour  Intake 1595.98 ml  Output 950 ml  Net 645.98 ml   Still off Cardene but BP starting to go up this evening  Remo Lipps C. Roxan Hockey, MD [facility] [phone]

## 2022-09-10 NOTE — Progress Notes (Signed)
6 Days Post-Op Procedure(s) (LRB): CORONARY ARTERY BYPASS GRAFTING (CABG) X 3 USING LEFT INTERNAL MAMMARY AND BILATERAL LEG GREATER SAPHENOUS VEIN VARVESTED ENDOSCOPICALLY (N/A) TRANSESOPHAGEAL ECHOCARDIOGRAM (TEE) (N/A) Subjective: C/o feeling weak and tired  Objective: Vital signs in last 24 hours: Temp:  [98.1 F (36.7 C)-98.9 F (37.2 C)] 98.3 F (36.8 C) (11/05 0800) Pulse Rate:  [54-75] 63 (11/05 0930) Cardiac Rhythm: Normal sinus rhythm (11/05 0600) Resp:  [17-33] 27 (11/05 0930) BP: (137-185)/(51-106) 151/58 (11/05 0930) SpO2:  [91 %-97 %] 92 % (11/05 0930) Weight:  [54.1 kg] 54.1 kg (11/05 0545)  Hemodynamic parameters for last 24 hours:    Intake/Output from previous day: 11/04 0701 - 11/05 0700 In: 1750 [P.O.:530; I.V.:1220] Out: 900 [Urine:900] Intake/Output this shift: Total I/O In: 295.8 [P.O.:120; I.V.:175.8] Out: 200 [Urine:200]  General appearance: alert, cooperative, and no distress Neurologic: intact Heart: brady, regular Lungs: diminished breath sounds bibasilar Abdomen: normal findings: soft, non-tender  Lab Results: Recent Labs    09/08/22 0451 09/09/22 0712  WBC 10.5 9.4  HGB 9.4* 9.1*  HCT 28.5* 27.5*  PLT 309 302   BMET:  Recent Labs    09/08/22 0451 09/09/22 0712  NA 138 135  K 4.0 4.8  CL 107 108  CO2 19* 15*  GLUCOSE 108* 182*  BUN 43* 41*  CREATININE 1.76* 1.51*  CALCIUM 8.7* 7.9*    PT/INR: No results for input(s): "LABPROT", "INR" in the last 72 hours. ABG    Component Value Date/Time   PHART 7.503 (H) 09/07/2022 1256   HCO3 19.7 (L) 09/07/2022 1256   TCO2 20 (L) 09/07/2022 1256   ACIDBASEDEF 3.0 (H) 09/07/2022 1256   O2SAT 93 09/07/2022 1256   CBG (last 3)  Recent Labs    09/09/22 1626 09/09/22 2144 09/10/22 0821  GLUCAP 134* 125* 186*    Assessment/Plan: S/P Procedure(s) (LRB): CORONARY ARTERY BYPASS GRAFTING (CABG) X 3 USING LEFT INTERNAL MAMMARY AND BILATERAL LEG GREATER SAPHENOUS VEIN VARVESTED  ENDOSCOPICALLY (N/A) TRANSESOPHAGEAL ECHOCARDIOGRAM (TEE) (N/A) - Hypertension remains difficult to control.   Was back on Cardene drip overnight- off currently Lopressor increased to 50 mg BID, but now bradycardic- will decrease back to 25 mg BID Will add clonidine 0.2 mg BID Continue losartan, hydralazine, amlodipine Creatinine down from 1.76 to 1.5 this AM. monitor Deconditioning- continue to mobilize   LOS: 6 days    Melrose Nakayama 09/10/2022

## 2022-09-11 LAB — GLUCOSE, CAPILLARY
Glucose-Capillary: 201 mg/dL — ABNORMAL HIGH (ref 70–99)
Glucose-Capillary: 100 mg/dL — ABNORMAL HIGH (ref 70–99)
Glucose-Capillary: 52 mg/dL — ABNORMAL LOW (ref 70–99)
Glucose-Capillary: 94 mg/dL (ref 70–99)
Glucose-Capillary: 88 mg/dL (ref 70–99)

## 2022-09-11 LAB — BASIC METABOLIC PANEL
Anion gap: 10 (ref 5–15)
BUN: 34 mg/dL — ABNORMAL HIGH (ref 8–23)
CO2: 14 mmol/L — ABNORMAL LOW (ref 22–32)
Calcium: 8.4 mg/dL — ABNORMAL LOW (ref 8.9–10.3)
Chloride: 115 mmol/L — ABNORMAL HIGH (ref 98–111)
Creatinine, Ser: 1.31 mg/dL — ABNORMAL HIGH (ref 0.44–1.00)
GFR, Estimated: 44 mL/min — ABNORMAL LOW (ref >60)
Glucose, Bld: 86 mg/dL (ref 70–99)
Potassium: 5.6 mmol/L — ABNORMAL HIGH (ref 3.5–5.1)
Sodium: 139 mmol/L (ref 135–145)

## 2022-09-11 MED ORDER — INSULIN ASPART 100 UNIT/ML IJ SOLN
0.0000 [IU] | Freq: Three times a day (TID) | INTRAMUSCULAR | Status: DC
  Administered 2022-09-12: 2 [IU] via SUBCUTANEOUS
  Administered 2022-09-13: 4 [IU] via SUBCUTANEOUS
  Administered 2022-09-14: 2 [IU] via SUBCUTANEOUS
  Administered 2022-09-15: 4 [IU] via SUBCUTANEOUS
  Administered 2022-09-15: 2 [IU] via SUBCUTANEOUS
  Administered 2022-09-15: 4 [IU] via SUBCUTANEOUS
  Administered 2022-09-16 – 2022-09-17 (×3): 2 [IU] via SUBCUTANEOUS

## 2022-09-11 NOTE — Progress Notes (Signed)
      [address]       [place] [address]             [phone]                 7 Days Post-Op Procedure(s) (LRB): CORONARY ARTERY BYPASS GRAFTING (CABG) X 3 USING LEFT INTERNAL MAMMARY AND BILATERAL LEG GREATER SAPHENOUS VEIN VARVESTED ENDOSCOPICALLY (N/A) TRANSESOPHAGEAL ECHOCARDIOGRAM (TEE) (N/A)   Events: No events _______________________________________________________________ Vitals: BP (!) 151/59   Pulse (!) 58   Temp 98.3 F (36.8 C) (Oral)   Resp (!) 21   Ht '5\' 1"'$  (1.549 m)   Wt 54.5 kg   SpO2 96%   BMI 22.70 kg/m  Filed Weights   09/09/22 0630 09/10/22 0545 09/11/22 0600  Weight: 50.9 kg 54.1 kg 54.5 kg     - Neuro: alert NAd  - Cardiovascular: sinus brady  Drips: none.      - Pulm: EWOB    ABG    Component Value Date/Time   PHART 7.503 (H) 09/07/2022 1256   PCO2ART 25.0 (L) 09/07/2022 1256   PO2ART 57 (L) 09/07/2022 1256   HCO3 19.7 (L) 09/07/2022 1256   TCO2 20 (L) 09/07/2022 1256   ACIDBASEDEF 3.0 (H) 09/07/2022 1256   O2SAT 93 09/07/2022 1256    - Abd: ND - Extremity: warm  .Intake/Output      11/05 0701 11/06 0700 11/06 0701 11/07 0700   P.O. 800    I.V. (mL/kg) 321.9 (5.9)    Total Intake(mL/kg) 1121.9 (20.6)    Urine (mL/kg/hr) 951 (0.7) 300 (1.3)   Emesis/NG output 0    Stool 2 0   Total Output 953 300   Net +168.9 -300        Urine Occurrence 3 x    Stool Occurrence 5 x 1 x   Emesis Occurrence 0 x       _______________________________________________________________ Labs:    Latest Ref Rng & Units 09/09/2022    7:12 AM 09/08/2022    4:51 AM 09/07/2022   12:56 PM  CBC  WBC 4.0 - 10.5 K/uL 9.4  10.5    Hemoglobin 12.0 - 15.0 g/dL 9.1  9.4  9.9   Hematocrit 36.0 - 46.0 % 27.5  28.5  29.0   Platelets 150 - 400 K/uL 302  309        Latest Ref Rng & Units 09/11/2022    4:20 AM 09/09/2022    7:12 AM 09/08/2022    4:51 AM  CMP  Glucose 70 - 99 mg/dL 86  182  108   BUN 8 - 23 mg/dL 34  41  43    Creatinine 0.44 - 1.00 mg/dL 1.31  1.51  1.76   Sodium 135 - 145 mmol/L 139  135  138   Potassium 3.5 - 5.1 mmol/L 5.6  4.8  4.0   Chloride 98 - 111 mmol/L 115  108  107   CO2 22 - 32 mmol/L '14  15  19   '$ Calcium 8.9 - 10.3 mg/dL 8.4  7.9  8.7     CXR: -  _______________________________________________________________  Assessment and Plan: POD 7 s/p CABG  Neuro: pain controlled CV: BP better, will refer to HTN clinic Pulm: IS ambulation Renal: creat trending down GI: on diet Heme: stable ID: [email or web address] Endo: SSI Dispo: floor   Fabrice Dyal O Sarp Vernier 09/11/2022 11:18 AM

## 2022-09-11 NOTE — Progress Notes (Signed)
Patient ID: Jasmine Keith, female   DOB: 1951-03-09, 71 y.o.   MRN: [id number] TCTS Evening Rounds  Hemodynamically stable   Awaiting transfer to 4E.

## 2022-09-11 NOTE — Progress Notes (Signed)
CBG 52 at 1616. Patient not symptomatic. Gave patient orage juice. Recheck CBG at 1650 it was 94. Patient doing well up in the chair eating dinner.

## 2022-09-12 ENCOUNTER — Inpatient Hospital Stay (HOSPITAL_COMMUNITY): Payer: Medicare Other

## 2022-09-12 DIAGNOSIS — Z951 Presence of aortocoronary bypass graft: Secondary | ICD-10-CM | POA: Diagnosis not present

## 2022-09-12 DIAGNOSIS — I10 Essential (primary) hypertension: Secondary | ICD-10-CM

## 2022-09-12 LAB — BASIC METABOLIC PANEL
Anion gap: 8 (ref 5–15)
BUN: 25 mg/dL — ABNORMAL HIGH (ref 8–23)
CO2: 18 mmol/L — ABNORMAL LOW (ref 22–32)
Calcium: 8.9 mg/dL (ref 8.9–10.3)
Chloride: 113 mmol/L — ABNORMAL HIGH (ref 98–111)
Creatinine, Ser: 1.29 mg/dL — ABNORMAL HIGH (ref 0.44–1.00)
GFR, Estimated: 45 mL/min — ABNORMAL LOW (ref >60)
Glucose, Bld: 70 mg/dL (ref 70–99)
Potassium: 4.6 mmol/L (ref 3.5–5.1)
Sodium: 139 mmol/L (ref 135–145)

## 2022-09-12 LAB — GLUCOSE, CAPILLARY
Glucose-Capillary: 149 mg/dL — ABNORMAL HIGH (ref 70–99)
Glucose-Capillary: 70 mg/dL (ref 70–99)
Glucose-Capillary: 108 mg/dL — ABNORMAL HIGH (ref 70–99)
Glucose-Capillary: 157 mg/dL — ABNORMAL HIGH (ref 70–99)

## 2022-09-12 MED ORDER — INSULIN DETEMIR 100 UNIT/ML ~~LOC~~ SOLN
8.0000 [IU] | Freq: Every day | SUBCUTANEOUS | Status: DC
  Administered 2022-09-12 – 2022-09-14 (×3): 8 [IU] via SUBCUTANEOUS
  Filled 2022-09-12 (×5): qty 0.08

## 2022-09-12 MED ORDER — PNEUMOCOCCAL 20-VAL CONJ VACC 0.5 ML IM SUSY
0.5000 mL | PREFILLED_SYRINGE | INTRAMUSCULAR | Status: DC
  Filled 2022-09-12: qty 0.5

## 2022-09-12 MED ORDER — ORAL CARE MOUTH RINSE: 15.0000 mL | OROMUCOSAL | Status: DC | PRN

## 2022-09-12 MED ORDER — HYDRALAZINE HCL 50 MG PO TABS
100.0000 mg | ORAL_TABLET | Freq: Three times a day (TID) | ORAL | Status: DC
  Administered 2022-09-12 – 2022-09-17 (×15): 100 mg via ORAL
  Filled 2022-09-12 (×15): qty 2

## 2022-09-12 MED ORDER — CARVEDILOL 12.5 MG PO TABS
12.5000 mg | ORAL_TABLET | Freq: Two times a day (BID) | ORAL | Status: DC
  Administered 2022-09-12 – 2022-09-15 (×6): 12.5 mg via ORAL
  Filled 2022-09-12 (×6): qty 1

## 2022-09-12 NOTE — Progress Notes (Signed)
     [address]       [place], [address]             [phone]       EVENING ROUNDS POD #8 SP CABG HTN continues to be monitored and has been off Cardene drip for a few hours Other oral meds adjusted by HTN team Continues to have arm and leg heaviness Otherwise stable

## 2022-09-12 NOTE — TOC Progression Note (Signed)
Transition of Care [facility]) - Progression Note    Patient Details  Name: Jasmine Keith MRN: [id number] Date of Birth: 10-Dec-1950  Transition of Care [facility]) CM/SW Contact  Graves-Bigelow, Ocie Cornfield, RN Phone Number: 09/12/2022, 4:23 PM  Clinical Narrative: Patient was discussed in morning rounds-POD-8 CABG. Cardene gtt has been stopped. Case Manager will continue to monitor for transition of care needs as the patient progresses.    Expected Discharge Plan: [place] Barriers to Discharge: Continued Medical Work up  Expected Discharge Plan and Services Expected Discharge Plan: [place] In-house Referral: NA Discharge Planning Services: CM Consult Post Acute Care Choice: [place] arrangements for the past 2 months: Single Family Home                   DME Agency: NA  Social Determinants of Health (SDOH) Interventions Housing Interventions: Intervention Not Indicated  Readmission Risk Interventions    09/06/2022   12:29 PM  Readmission Risk Prevention Plan  Transportation Screening Complete  Medication Review (RN Care Manager) Referral to Pharmacy  [place] or Home Care Consult Complete  SW Recovery Care/Counseling Consult Complete  Palliative Care Screening Not Applicable  Skilled Nursing Facility Complete

## 2022-09-12 NOTE — Inpatient Diabetes Management (Signed)
Inpatient Diabetes Program Recommendations  AACE/ADA: New Consensus Statement on Inpatient Glycemic Control (2015)  Target Ranges:  Prepandial:   less than 140 mg/dL      Peak postprandial:   less than 180 mg/dL (1-2 hours)      Critically ill patients:  140 - 180 mg/dL   Lab Results  Component Value Date   GLUCAP 70 09/12/2022   HGBA1C 6.9 (H) 09/01/2022    Review of Glycemic Control  Latest Reference Range & Units 09/12/22 08:44 09/12/22 11:43  Glucose-Capillary 70 - 99 mg/dL 149 (H) 70  (H): Data is abnormally high  Current orders for Inpatient glycemic control: Levemir 8 units QD, Novolog 0-24 units TID  Inpatient Diabetes Program Recommendations:    Please consider,  Novolog 0-9 units TID & 0-5 units QHS.  Will continue to follow while inpatient.  Thank you, Reche Dixon, MSN, [place] Diabetes Coordinator Inpatient Diabetes Program [phone] (team pager from 8a-5p)

## 2022-09-12 NOTE — Progress Notes (Addendum)
TCTS DAILY ICU PROGRESS NOTE                   [address]            [place] [address]          [phone]   8 Days Post-Op Procedure(s) (LRB): CORONARY ARTERY BYPASS GRAFTING (CABG) X 3 USING LEFT INTERNAL MAMMARY AND BILATERAL LEG GREATER SAPHENOUS VEIN VARVESTED ENDOSCOPICALLY (N/A) TRANSESOPHAGEAL ECHOCARDIOGRAM (TEE) (N/A)  Total Length of Stay:  LOS: 8 days   Subjective: Patient states "my arms are heavy and my feet are swollen". She states she did not walk yesterday because of her feet.  Objective: Vital signs in last 24 hours: Temp:  [98.2 F (36.8 C)-98.3 F (36.8 C)] 98.2 F (36.8 C) (11/06 2000) Pulse Rate:  [47-76] 64 (11/07 0530) Cardiac Rhythm: Sinus bradycardia (11/07 0400) Resp:  [9-33] 14 (11/07 0530) BP: (124-171)/(52-115) 165/63 (11/07 0530) SpO2:  [94 %-98 %] 96 % (11/07 0530)  Filed Weights   09/09/22 0630 09/10/22 0545 09/11/22 0600  Weight: 50.9 kg 54.1 kg 54.5 kg       Intake/Output from previous day: 11/06 0701 - 11/07 0700 In: 120 [P.O.:120] Out: 302 [Urine:301; Stool:1]  Intake/Output this shift: No intake/output data recorded.  Current Meds: Scheduled Meds:  amLODipine  10 mg Oral Daily   aspirin EC  325 mg Oral Daily   Or   aspirin  324 mg Per Tube Daily   bisacodyl  10 mg Oral Daily   Or   bisacodyl  10 mg Rectal Daily   busPIRone  7.5 mg Oral BID   Chlorhexidine Gluconate Cloth  6 each Topical Daily   cloNIDine  0.2 mg Oral BID   dicyclomine  10 mg Oral TID AC & HS   docusate sodium  200 mg Oral Daily   enoxaparin (LOVENOX) injection  30 mg Subcutaneous QHS   gabapentin  300 mg Oral BID   hydrALAZINE  25 mg Oral TID   insulin aspart  0-24 Units Subcutaneous TID WC   insulin detemir  10 Units Subcutaneous Daily   levothyroxine  25 mcg Oral Q0600   losartan  50 mg Oral Daily   metoprolol tartrate  25 mg Oral BID   pantoprazole  40 mg Oral Daily   sodium chloride flush  10-40 mL Intracatheter Q12H    sodium chloride flush  3 mL Intravenous Q12H   venlafaxine XR  75 mg Oral Q breakfast   Continuous Infusions:  sodium chloride Stopped (09/05/22 1110)   sodium chloride     sodium chloride 10 mL/hr at 09/10/22 2005   sodium chloride     lactated ringers Stopped (09/09/22 1117)   lactated ringers Stopped (09/05/22 0841)   niCARDipine Stopped (09/10/22 2231)   PRN Meds:.sodium chloride, sodium chloride, hydrALAZINE, ipratropium-albuterol, metoprolol tartrate, ondansetron (ZOFRAN) IV, mouth rinse, oxyCODONE, sodium chloride flush, sodium chloride flush, sodium chloride flush, traMADol  General appearance: alert, cooperative, and no distress Neurologic: intact Heart: RRR Lungs: clear to auscultation bilaterally Abdomen: Soft, non tender, bowel sounds present Extremities: Both arms appear somewhat swollen but no LE edema and feet are not very swollen Wounds: Sternal and RLE wounds are clean and dry  Lab Results: CBC:No results for input(s): "WBC", "HGB", "HCT", "PLT" in the last 72 hours. BMET:  Recent Labs    09/11/22 0420 09/12/22 0453  NA 139 139  K 5.6* 4.6  CL 115* 113*  CO2 14* 18*  GLUCOSE  86 70  BUN 34* 25*  CREATININE 1.31* 1.29*  CALCIUM 8.4* 8.9    CMET: Lab Results  Component Value Date   WBC 9.4 09/09/2022   HGB 9.1 (L) 09/09/2022   HCT 27.5 (L) 09/09/2022   PLT 302 09/09/2022   GLUCOSE 70 09/12/2022   CHOL 229 (H) 08/07/2022   TRIG 307.0 (H) 08/07/2022   HDL 49.00 08/07/2022   LDLDIRECT 154.0 08/07/2022   ALT 15 09/01/2022   AST 22 09/01/2022   NA 139 09/12/2022   K 4.6 09/12/2022   CL 113 (H) 09/12/2022   CREATININE 1.29 (H) 09/12/2022   BUN 25 (H) 09/12/2022   CO2 18 (L) 09/12/2022   TSH 4.30 07/04/2022   INR 1.4 (H) 09/04/2022   HGBA1C 6.9 (H) 09/01/2022      PT/INR: No results for input(s): "LABPROT", "INR" in the last 72 hours. Radiology: No results found.   Assessment/Plan: S/P Procedure(s) (LRB): CORONARY ARTERY BYPASS GRAFTING  (CABG) X 3 USING LEFT INTERNAL MAMMARY AND BILATERAL LEG GREATER SAPHENOUS VEIN VARVESTED ENDOSCOPICALLY (N/A) TRANSESOPHAGEAL ECHOCARDIOGRAM (TEE) (N/A) CV-Hypertensive with SBP in the 160's. On Amlodipine 10 mg daily, Clonidine  0.2 mg bid, Hydralazine 25 mg tid, Losartan 50 mg daily, Lopressor 25 mg bid. Per Dr. Kipp Brood, resume Cardene drip. Dr.Nyari Olsson awaiting hypertensive clinic MD to evaluate Pulmonary-on room air. Encourage incentive spirometer. History of hypothyroidism-continue Levothyroxine 25 mcg daily DM-CBGs 52/94/88. On Insulin but will decrease to avoid further hypoglycemia. 5. AKI-Creatinine slightly decreased to 1.29 this am. Creatinine 1.24 prior to surgery.    Donielle Liston [place] PA-C 09/12/2022 8:00 AM  Agree with above Restarting cardene for BP HTN service consulted  Lajuana Matte
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
## 2022-09-13 DIAGNOSIS — Z951 Presence of aortocoronary bypass graft: Secondary | ICD-10-CM | POA: Diagnosis not present

## 2022-09-13 DIAGNOSIS — I10 Essential (primary) hypertension: Secondary | ICD-10-CM | POA: Diagnosis not present

## 2022-09-13 LAB — GLUCOSE, CAPILLARY
Glucose-Capillary: 161 mg/dL — ABNORMAL HIGH (ref 70–99)
Glucose-Capillary: 119 mg/dL — ABNORMAL HIGH (ref 70–99)
Glucose-Capillary: 110 mg/dL — ABNORMAL HIGH (ref 70–99)
Glucose-Capillary: 57 mg/dL — ABNORMAL LOW (ref 70–99)
Glucose-Capillary: 76 mg/dL (ref 70–99)

## 2022-09-13 LAB — BASIC METABOLIC PANEL
Anion gap: 8 (ref 5–15)
BUN: 24 mg/dL — ABNORMAL HIGH (ref 8–23)
CO2: 18 mmol/L — ABNORMAL LOW (ref 22–32)
Calcium: 8.5 mg/dL — ABNORMAL LOW (ref 8.9–10.3)
Chloride: 112 mmol/L — ABNORMAL HIGH (ref 98–111)
Creatinine, Ser: 1.31 mg/dL — ABNORMAL HIGH (ref 0.44–1.00)
GFR, Estimated: 44 mL/min — ABNORMAL LOW (ref >60)
Glucose, Bld: 87 mg/dL (ref 70–99)
Potassium: 4.1 mmol/L (ref 3.5–5.1)
Sodium: 138 mmol/L (ref 135–145)

## 2022-09-13 MED ORDER — LOSARTAN POTASSIUM 50 MG PO TABS: 75.0000 mg | ORAL_TABLET | Freq: Every day | ORAL | Status: DC

## 2022-09-13 NOTE — Consult Note (Signed)
   [facility] Inpatient Consult   09/13/2022  Sarahjane Matherly [facility] 04/08/1951 [id number]  [facility] [facility] [ACO] Patient: Medicare ACO REACH   Primary Care Provider:  Elise Benne, [place] [facility] is listed to provide the transition of care follow up  Patient screened for hospitalization with noted high risk score for unplanned readmission risk and length of stay  to assess for potential [place] Management service needs for post hospital transition for care coordination.  Review of patient's electronic medical record reveals patient is recently transitioned from ICU. 1:55 pm Met with patient and spouse regarding potential [facility] community care coordination needs. They endorse PCP and wanted to know about her potential rehab or therapy needs.   Cardiac rehab came by to work with patient.   Plan:  Continue to follow progress and disposition to assess for post hospital community care coordination/management needs.  Referral request for community care coordination: awaiting disposition/needs.  Of note, [facility] Care Management/Population Health does not replace or interfere with any arrangements made by the Inpatient Transition of Care team.  For questions contact:   Natividad Brood, RN BSN [place]  [phone] business mobile phone Toll free office [phone]  *[place]  [phone] Fax number: [phone] [email or web address]

## 2022-09-13 NOTE — Progress Notes (Addendum)
      [address]       [place] [address]             [phone]        9 Days Post-Op Procedure(s) (LRB): CORONARY ARTERY BYPASS GRAFTING (CABG) X 3 USING LEFT INTERNAL MAMMARY AND BILATERAL LEG GREATER SAPHENOUS VEIN VARVESTED ENDOSCOPICALLY (N/A) TRANSESOPHAGEAL ECHOCARDIOGRAM (TEE) (N/A)  Subjective: Patient walked yesterday. She has no specific complaint this am  Objective: Vital signs in last 24 hours: Temp:  [98.5 F (36.9 C)-99.3 F (37.4 C)] 98.8 F (37.1 C) (11/08 0700) Pulse Rate:  [51-68] 65 (11/08 0700) Cardiac Rhythm: Normal sinus rhythm (11/08 0025) Resp:  [15-29] 19 (11/08 0700) BP: (127-185)/(52-140) 152/60 (11/08 0700) SpO2:  [91 %-96 %] 96 % (11/08 0700) Weight:  [53.4 kg-53.8 kg] 53.8 kg (11/08 0500)  Pre op weight  49.6 kg Current Weight  09/13/22 53.8 kg       Intake/Output from previous day: 11/07 0701 - 11/08 0700 In: 523.2 [P.O.:340; I.V.:183.2] Out: 2 [Urine:1; Stool:1]   Physical Exam:  Cardiovascular: RRR Pulmonary: Clear to auscultation bilaterally Abdomen: Soft, non tender, bowel sounds present. Extremities: No lower extremity edema. Wounds: Clean and dry.  No erythema or signs of infection.  Lab Results: CBC:No results for input(s): "WBC", "HGB", "HCT", "PLT" in the last 72 hours. BMET:  Recent Labs    09/12/22 0453 09/13/22 0153  NA 139 138  K 4.6 4.1  CL 113* 112*  CO2 18* 18*  GLUCOSE 70 87  BUN 25* 24*  CREATININE 1.29* 1.31*  CALCIUM 8.9 8.5*    PT/INR:  Lab Results  Component Value Date   INR 1.4 (H) 09/04/2022   INR 1.0 09/01/2022   ABG:  INR: Will add last result for INR, ABG once components are confirmed Will add last 4 CBG results once components are confirmed  Assessment/Plan: CV-Uncontrolled hypertension.SBP in the 150's this am. On Amlodipine 10 mg daily, Clonidine  0.2 mg bid, Hydralazine 100 mg tid, Losartan 50 mg daily, Coreg 12.5 mg bid. Cardiology  following. Pulmonary-On room air. Encourage incentive spirometer. History of hypothyroidism-continue Levothyroxine 25 mcg daily DM-CBGs 70/108/157. On Insulin. 5. AKI-Creatinine increased 1.31 this am. Creatinine 1.24 prior to surgery. 6. Once BP more controlled, will discharge  Donielle M ZimmermanPA-C 7:41 AM   Agree with above Appreciate cards recs Will d/c once off clonidine  Atari Novick O Rondy Krupinski

## 2022-09-13 NOTE — Progress Notes (Signed)
Mobility Specialist Progress Note:   09/13/22 1600  Mobility  Activity Refused mobility   Pt in bed stating she's been up since midnight and is not getting up today. Even with max encouragement she refused. Will follow-up as time allows.   [facility] Surveyor, mining Chat only

## 2022-09-13 NOTE — Progress Notes (Signed)
Pt in bed resting comfortably with husband at side, pt declined ambulation and education at this time due to feeling tired and requesting time to sleep. Exercise, diet, sternal precautions worksheet left in pt's room. Will initiate education tomorrow.   Initial HR 68 NSR and BP 166/78

## 2022-09-13 NOTE — Care Management Important Message (Signed)
Important Message  Patient Details  Name: Jasmine Keith MRN: [id number] Date of Birth: September 12, 1951   Medicare Important Message Given:  Yes     Shelda Altes 09/13/2022, 10:47 AM

## 2022-09-13 NOTE — Progress Notes (Signed)
Pt's SBP was in 150's on arrival and continued to be in 150's. PRN hydralazine given . But it didn't seem to help, not able to give PRN metoprolol as HR was in 60's. After patient had used restroom, BP started going up. '@0554'$ :177/70, '@0602'$ : 177/54. Patient complained of left flank pain , 7/10. Prn pain medication administered, Po coreg that was scheduled for 0800 was given early. Recheck BP @ 0734was 152/60. Plan of care continues. Passed on to day shift RN.

## 2022-09-13 NOTE — Progress Notes (Addendum)
Rounding Note    Patient Name: Jasmine Keith Date of Encounter: 09/13/2022  [place] Cardiologist: Jenean Lindau, MD   Subjective   She states her feet are swollen, she does not feel well walking, denied any chest pain, SOB. She states her BP is always high despite taking multiple meds historically.   Inpatient Medications    Scheduled Meds:  amLODipine  10 mg Oral Daily   aspirin EC  325 mg Oral Daily   bisacodyl  10 mg Oral Daily   Or   bisacodyl  10 mg Rectal Daily   busPIRone  7.5 mg Oral BID   carvedilol  12.5 mg Oral BID WC   Chlorhexidine Gluconate Cloth  6 each Topical Daily   cloNIDine  0.2 mg Oral BID   dicyclomine  10 mg Oral TID AC & HS   docusate sodium  200 mg Oral Daily   enoxaparin (LOVENOX) injection  30 mg Subcutaneous QHS   gabapentin  300 mg Oral BID   hydrALAZINE  100 mg Oral TID   insulin aspart  0-24 Units Subcutaneous TID WC   insulin detemir  8 Units Subcutaneous Daily   levothyroxine  25 mcg Oral Q0600   [START ON 09/14/2022] losartan  75 mg Oral Daily   pantoprazole  40 mg Oral Daily   pneumococcal 20-valent conjugate vaccine  0.5 mL Intramuscular Tomorrow-1000   sodium chloride flush  3 mL Intravenous Q12H   venlafaxine XR  75 mg Oral Q breakfast   Continuous Infusions:  sodium chloride     PRN Meds: sodium chloride, hydrALAZINE, ipratropium-albuterol, metoprolol tartrate, ondansetron (ZOFRAN) IV, mouth rinse, oxyCODONE, sodium chloride flush, traMADol   Vital Signs    Vitals:   09/13/22 0615 09/13/22 0616 09/13/22 0700 09/13/22 0830  BP:  (!) 177/54 (!) 152/60 (!) 149/59  Pulse:  67 65 61  Resp: '20  19 20  '$ Temp:   98.8 F (37.1 C)   TempSrc:   Oral   SpO2:   96% 95%  Weight:      Height:        Intake/Output Summary (Last 24 hours) at 09/13/2022 0857 Last data filed at 09/13/2022 0800 Gross per 24 hour  Intake 763.15 ml  Output 2 ml  Net 761.15 ml      09/13/2022    5:00 AM 09/12/2022    9:53 AM  09/11/2022    6:00 AM  Last 3 Weights  Weight (lbs) 118 lb 9.7 oz 117 lb 11.6 oz 120 lb 2.4 oz  Weight (kg) 53.8 kg 53.4 kg 54.5 kg      Telemetry    Sinus bradycardia high 50s-low 60s  - Personally Reviewed  ECG    N/A today - Personally Reviewed  Physical Exam   GEN: No acute distress.   Neck: Right neck has a dressing in place, not removed for exam  Cardiac: RRR, no murmurs, rubs, or gallops, Mid sternum incision intact.  Respiratory: Clear to auscultation bilaterally. On room air. GI: Soft, nontender, non-distended  MS: RLE edema, incision intact; No deformity. Neuro:  Nonfocal  Psych: Normal affect   Labs    High Sensitivity Troponin:  No results for input(s): "TROPONINIHS" in the last 720 hours.   Chemistry Recent Labs  Lab 09/09/22 0712 09/11/22 0420 09/12/22 0453 09/13/22 0153  NA 135 139 139 138  K 4.8 5.6* 4.6 4.1  CL 108 115* 113* 112*  CO2 15* 14* 18* 18*  GLUCOSE 182* 86  70 87  BUN 41* 34* 25* 24*  CREATININE 1.51* 1.31* 1.29* 1.31*  CALCIUM 7.9* 8.4* 8.9 8.5*  MG 1.7  --   --   --   GFRNONAA 37* 44* 45* 44*  ANIONGAP '12 10 8 8    '$ Lipids No results for input(s): "CHOL", "TRIG", "HDL", "LABVLDL", "LDLCALC", "CHOLHDL" in the last 168 hours.  Hematology Recent Labs  Lab 09/07/22 0127 09/07/22 1256 09/08/22 0451 09/09/22 0712  WBC 12.8*  --  10.5 9.4  RBC 3.68*  --  3.49* 3.36*  HGB 10.3* 9.9* 9.4* 9.1*  HCT 29.8* 29.0* 28.5* 27.5*  MCV 81.0  --  81.7 81.8  MCH 28.0  --  26.9 27.1  MCHC 34.6  --  33.0 33.1  RDW 15.1  --  15.7* 15.6*  PLT 235  --  309 302   Thyroid No results for input(s): "TSH", "FREET4" in the last 168 hours.  BNPNo results for input(s): "BNP", "PROBNP" in the last 168 hours.  DDimer No results for input(s): "DDIMER" in the last 168 hours.   Radiology    DG CHEST PORT 1 VIEW  Result Date: 09/12/2022 CLINICAL DATA:  Short of breath EXAM: PORTABLE CHEST 1 VIEW COMPARISON:  Chest 09/08/2022 FINDINGS: Postop median  sternotomy and CABG. Heart size upper normal. Negative for heart failure. Left lower lobe airspace disease and small left effusion unchanged. Right lung remains clear. IMPRESSION: Left lower lobe airspace disease and small left effusion unchanged. Electronically Signed   By: Franchot Gallo M.D.   On: 09/12/2022 15:48    Cardiac Studies   Echo from 08/17/22:   1. Left ventricular ejection fraction, by estimation, is 70 to 75%. The  left ventricle has hyperdynamic function. The left ventricle has no  regional wall motion abnormalities. There is mild concentric left  ventricular hypertrophy. Left ventricular  diastolic parameters are consistent with Grade I diastolic dysfunction  (impaired relaxation). The average left ventricular global longitudinal  strain is -16.5 %.   2. Right ventricular systolic function is normal. The right ventricular  size is normal.   3. The mitral valve is degenerative. No evidence of mitral valve  regurgitation. No evidence of mitral stenosis.   4. The aortic valve is tricuspid. Aortic valve regurgitation is not  visualized. Aortic valve sclerosis is present, with no evidence of aortic  valve stenosis.   5. Aortic Normal DTA.   6. The inferior vena cava is normal in size with greater than 50%  respiratory variability, suggesting right atrial pressure of 3 mmHg.   Patient Profile     Jasmine Keith is a 71 y.o. 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   Assessment & Plan    Uncontrolled HTN - historically uncontrolled on multiple agents  - off cardene gtt now, BP remains elevated, no symptoms switched from metoprolol to Coreg 12.'5mg'$  BID, won't up titrate given bradycardia; continue amlodipine '10mg'$  daily, increased hydralazine to '100mg'$  TID; will increase losartan to '75mg'$  daily today; continue  clonidine 0.'2mg'$  BID/needs wean off outpatient  Hyperkalemia - K 5.6 today, will repeat STAT K to confirm , if truly high, may not increase ARB     CAD s/p 3V CABG -- 10/30 with Dr. Kipp Brood -- continue ASA '325mg'$ , BB, losartan -- statin intolerant, plan lipid clinic referral as an outpatient  HLD -- statin intolerant -- outpatient lipid clinic referral    DM -- Hgb A1c 6.9 -- currently SSI TID and levemir 8 unit daily    Post op anemia -- Hgb overall stable around 9   Tobacco use -- cessation advised    For questions or updates, please contact [place] Please consult [email or web address] for contact info under        Signed, Margie Billet, NP  09/13/2022, 8:57 AM  '

## 2022-09-13 NOTE — Discharge Instructions (Signed)

## 2022-09-14 LAB — GLUCOSE, CAPILLARY
Glucose-Capillary: 69 mg/dL — ABNORMAL LOW (ref 70–99)
Glucose-Capillary: 181 mg/dL — ABNORMAL HIGH (ref 70–99)
Glucose-Capillary: 155 mg/dL — ABNORMAL HIGH (ref 70–99)
Glucose-Capillary: 114 mg/dL — ABNORMAL HIGH (ref 70–99)
Glucose-Capillary: 47 mg/dL — ABNORMAL LOW (ref 70–99)
Glucose-Capillary: 221 mg/dL — ABNORMAL HIGH (ref 70–99)

## 2022-09-14 MED ORDER — CLONIDINE HCL 0.1 MG PO TABS
0.1000 mg | ORAL_TABLET | Freq: Two times a day (BID) | ORAL | Status: DC
  Administered 2022-09-14 – 2022-09-17 (×7): 0.1 mg via ORAL
  Filled 2022-09-14 (×7): qty 1

## 2022-09-14 MED ORDER — LOSARTAN POTASSIUM 50 MG PO TABS
50.0000 mg | ORAL_TABLET | Freq: Two times a day (BID) | ORAL | Status: DC
  Administered 2022-09-14 – 2022-09-15 (×3): 50 mg via ORAL
  Filled 2022-09-14 (×3): qty 1

## 2022-09-14 MED FILL — Sodium Chloride IV Soln 0.9%: INTRAVENOUS | Qty: 2000 | Status: AC

## 2022-09-14 MED FILL — Heparin Sodium (Porcine) Inj 1000 Unit/ML: INTRAMUSCULAR | Qty: 20 | Status: AC

## 2022-09-14 MED FILL — Lidocaine HCl Local Preservative Free (PF) Inj 2%: INTRAMUSCULAR | Qty: 14 | Status: AC

## 2022-09-14 MED FILL — Heparin Sodium (Porcine) Inj 1000 Unit/ML: Qty: 1000 | Status: AC

## 2022-09-14 MED FILL — Potassium Chloride Inj 2 mEq/ML: INTRAVENOUS | Qty: 40 | Status: AC

## 2022-09-14 MED FILL — Sodium Bicarbonate IV Soln 8.4%: INTRAVENOUS | Qty: 50 | Status: AC

## 2022-09-14 MED FILL — Electrolyte-R (PH 7.4) Solution: INTRAVENOUS | Qty: 3000 | Status: AC

## 2022-09-14 MED FILL — Calcium Chloride Inj 10%: INTRAVENOUS | Qty: 10 | Status: AC

## 2022-09-14 NOTE — Progress Notes (Addendum)
Patient had hypoglycemic event. CBG dropped to 47. Patient asymptomatic. 4oz juice and small snack given and when rechecked CBG 114. Will continue to monitor. Ordering patient dinner at this time.  [place] C Javeon Macmurray

## 2022-09-14 NOTE — Inpatient Diabetes Management (Signed)
Inpatient Diabetes Program Recommendations  AACE/ADA: New Consensus Statement on Inpatient Glycemic Control (2015)  Target Ranges:  Prepandial:   less than 140 mg/dL      Peak postprandial:   less than 180 mg/dL (1-2 hours)      Critically ill patients:  140 - 180 mg/dL   Lab Results  Component Value Date   GLUCAP 181 (H) 09/14/2022   HGBA1C 6.9 (H) 09/01/2022    Review of Glycemic Control  Latest Reference Range & Units 09/13/22 08:17 09/13/22 10:47 09/13/22 15:39 09/13/22 20:57 09/13/22 21:55 09/14/22 06:04 09/14/22 06:48  Glucose-Capillary 70 - 99 mg/dL 161 (H) 119 (H) 110 (H) 57 (L) 76 69 (L) 181 (H)   Diabetes history: DM 2 Outpatient Diabetes medications: Levemir 15 units QHS, Metformin 1000 mg BID  Current orders for Inpatient glycemic control:  Levemir 8 units Novolog 0-24 units Q4  Inpatient Diabetes Program Recommendations:   Note hypoglycemia  -   Reduce Novolog Correction to Moderate 0-15 units    Thanks,  Tama Headings RN, MSN, BC-ADM Inpatient Diabetes Coordinator Team Pager [phone] (8a-5p)

## 2022-09-14 NOTE — Progress Notes (Signed)
Pt's CBG was 57. Pt did not eat her supper, had to reinforce her to eat some snacks. No s/s of distress. Re check after snack was 76. Plan of care continues.

## 2022-09-14 NOTE — Progress Notes (Signed)
CARDIAC REHAB PHASE I   PRE:  Rate/Rhythm: 64 NSR  BP:  Sitting: 160/50      SaO2: 96  MODE:  Ambulation: 2 ft    Pt reluctantly agreeable to ambulation, pt needing mod-max assist getting to EOB. After standing pt reported feeling "hot" and began removing telemetry box from gown. Pt states "I can't breathe" Sats 92-95 on RA, HR 64-65. I began encouraging pt to take deep slow breaths and continue ambulation attempt. Pt sat down, pulled telemetry box out of gown, and laid on the bed refusing to continue ambulation. Pt denied further ambulation attempts, reporting that she's too weak and hot, and will walk later.   Christen Bame, MS EP 10:57 AM 09/14/2022

## 2022-09-14 NOTE — Progress Notes (Signed)
Mobility Specialist Progress Note:   09/14/22 1507  Mobility  Activity Ambulated with assistance in hallway  Level of Assistance Contact guard assist, steadying assist  Assistive Device Front wheel walker  Distance Ambulated (ft) 70 ft  Activity Response Tolerated well  Mobility Referral Yes  $Mobility charge 1 Mobility   Pt received in bed willing to participate in mobility. No complaints of pain. Left in bed with call bell in reach and all needs met.   [facility] Surveyor, mining Chat only

## 2022-09-14 NOTE — Progress Notes (Addendum)
Patients BP started to go up around 1-2 am. 676'P-950'D systolic. PRN IV hydralazine given at 0203 which helped just a little. Dropped pressure down to 170/73. But slowly SB elevated to 180/57, 185/74. 0330: Dr lightfoot paged. No call back received.  0411: paged DR. Lightfoot again, no call back received.   Pt AO x. Denies any pain or discomfort. BP was 183/62. NO PRN to give at this time. Scheduled dose of coreg administered early. Plan of care continues.

## 2022-09-14 NOTE — Progress Notes (Signed)
CBG this am was 69. Recheck after snack was 181. Pt keeps refusing IS, keeps saying not right now. Educated on importance of IS use. Doesn't want to walk except going back and forth from bathroom. Not eating well. Plan of care continues.

## 2022-09-14 NOTE — Progress Notes (Addendum)
      [address]       [place] [address]             [phone]        10 Days Post-Op Procedure(s) (LRB): CORONARY ARTERY BYPASS GRAFTING (CABG) X 3 USING LEFT INTERNAL MAMMARY AND BILATERAL LEG GREATER SAPHENOUS VEIN VARVESTED ENDOSCOPICALLY (N/A) TRANSESOPHAGEAL ECHOCARDIOGRAM (TEE) (N/A)  Subjective: Patient states she did not walk yesterday. I encouraged her to walk at least 2 times everyday. She states she feels weak (most likely related to deconditioning and likely was hypertensive prior to surgery and now trying to control BP better).  Objective: Vital signs in last 24 hours: Temp:  [98.8 F (37.1 C)-101.7 F (38.7 C)] 100.1 F (37.8 C) (11/09 0602) Pulse Rate:  [57-67] 64 (11/09 0602) Cardiac Rhythm: Normal sinus rhythm (11/08 1904) Resp:  [19-27] 19 (11/09 0602) BP: (129-183)/(50-80) 166/62 (11/09 0602) SpO2:  [94 %-98 %] 96 % (11/09 0602) Weight:  [53.3 kg] 53.3 kg (11/09 0602)  Pre op weight  49.6 kg Current Weight  09/14/22 53.3 kg       Intake/Output from previous day: 11/08 0701 - 11/09 0700 In: 600 [P.O.:600] Out: -    Physical Exam:  Cardiovascular: RRR Pulmonary: Clear to auscultation bilaterally Abdomen: Soft, non tender, bowel sounds present. Extremities: No lower extremity edema. Wounds: Clean and dry.  No erythema or signs of infection.  Lab Results: CBC:No results for input(s): "WBC", "HGB", "HCT", "PLT" in the last 72 hours. BMET:  Recent Labs    09/12/22 0453 09/13/22 0153  NA 139 138  K 4.6 4.1  CL 113* 112*  CO2 18* 18*  GLUCOSE 70 87  BUN 25* 24*  CREATININE 1.29* 1.31*  CALCIUM 8.9 8.5*     PT/INR:  Lab Results  Component Value Date   INR 1.4 (H) 09/04/2022   INR 1.0 09/01/2022   ABG:  INR: Will add last result for INR, ABG once components are confirmed Will add last 4 CBG results once components are confirmed  Assessment/Plan: CV-Has uncontrolled hypertension.SBP in the 150's this am.  Per nursing staff, BP is better controlled until about mid afternoon and then increases into the 180's. On Amlodipine 10 mg daily, Clonidine  0.1 mg bid, Hydralazine 100 mg tid, Losartan 75 mg daily, Coreg 12.5 mg bid. Of note, Losartan was increased for this am. As discussed with Dr. Kipp Brood, will try Losartan 50 mg bid. Per Dr. Kipp Brood, will decrease Clonidine. Pulmonary-On room air. Encourage incentive spirometer. History of hypothyroidism-continue Levothyroxine 25 mcg daily DM-CBGs 76/69/181. On Insulin. 5. AKI-Creatinine increased 1.31 this am. Creatinine 1.24 prior to surgery. 6. Once BP more controlled, will discharge  Donielle M ZimmermanPA-C 6:59 AM    Agree with above Titrating bp meds  Heydi Swango O Perl Folmar

## 2022-09-14 NOTE — Progress Notes (Signed)
Mobility Specialist Progress Note:   09/14/22 1000  Mobility  Activity Refused mobility   Pt getting back in bed. Stating she want to get up at 3pm. Will follow-up as time allows.   [facility] Surveyor, mining Chat only

## 2022-09-14 NOTE — Progress Notes (Signed)
Patient's BP elevated 169/53 and PRN hydralazine given. Patient's spouse has questions regarding BP and medication changes. This RN paged Donielle, PA to come see patient but unable due to being in the office. This RN paged Kipp Brood, MD and stated would come to bedside to see patient.  [place] C Brealynn Contino

## 2022-09-14 NOTE — Plan of Care (Signed)

## 2022-09-15 ENCOUNTER — Telehealth: Payer: Medicare Other | Admitting: Thoracic Surgery (Cardiothoracic Vascular Surgery)

## 2022-09-15 LAB — GLUCOSE, CAPILLARY
Glucose-Capillary: 137 mg/dL — ABNORMAL HIGH (ref 70–99)
Glucose-Capillary: 166 mg/dL — ABNORMAL HIGH (ref 70–99)
Glucose-Capillary: 165 mg/dL — ABNORMAL HIGH (ref 70–99)
Glucose-Capillary: 168 mg/dL — ABNORMAL HIGH (ref 70–99)
Glucose-Capillary: 149 mg/dL — ABNORMAL HIGH (ref 70–99)
Glucose-Capillary: 88 mg/dL (ref 70–99)

## 2022-09-15 LAB — BASIC METABOLIC PANEL
Anion gap: 9 (ref 5–15)
BUN: 20 mg/dL (ref 8–23)
CO2: 16 mmol/L — ABNORMAL LOW (ref 22–32)
Calcium: 8.5 mg/dL — ABNORMAL LOW (ref 8.9–10.3)
Chloride: 111 mmol/L (ref 98–111)
Creatinine, Ser: 1.3 mg/dL — ABNORMAL HIGH (ref 0.44–1.00)
GFR, Estimated: 45 mL/min — ABNORMAL LOW (ref >60)
Glucose, Bld: 165 mg/dL — ABNORMAL HIGH (ref 70–99)
Potassium: 3.8 mmol/L (ref 3.5–5.1)
Sodium: 136 mmol/L (ref 135–145)

## 2022-09-15 MED ORDER — INSULIN DETEMIR 100 UNIT/ML ~~LOC~~ SOLN: 10.0000 [IU] | Freq: Every day | SUBCUTANEOUS | Status: DC

## 2022-09-15 MED ORDER — LOSARTAN POTASSIUM 50 MG PO TABS
50.0000 mg | ORAL_TABLET | Freq: Two times a day (BID) | ORAL | Status: AC
  Administered 2022-09-15: 50 mg via ORAL
  Filled 2022-09-15: qty 1

## 2022-09-15 MED ORDER — CARVEDILOL 25 MG PO TABS
25.0000 mg | ORAL_TABLET | Freq: Two times a day (BID) | ORAL | Status: DC
  Administered 2022-09-15 – 2022-09-17 (×4): 25 mg via ORAL
  Filled 2022-09-15 (×4): qty 1

## 2022-09-15 MED ORDER — POTASSIUM CHLORIDE CRYS ER 20 MEQ PO TBCR
30.0000 meq | EXTENDED_RELEASE_TABLET | Freq: Once | ORAL | Status: AC
  Administered 2022-09-15: 30 meq via ORAL
  Filled 2022-09-15: qty 1

## 2022-09-15 MED ORDER — INSULIN DETEMIR 100 UNIT/ML ~~LOC~~ SOLN
8.0000 [IU] | Freq: Every day | SUBCUTANEOUS | Status: DC
  Administered 2022-09-15 – 2022-09-17 (×3): 8 [IU] via SUBCUTANEOUS
  Filled 2022-09-15 (×4): qty 0.08

## 2022-09-15 MED ORDER — SACUBITRIL-VALSARTAN 49-51 MG PO TABS
1.0000 | ORAL_TABLET | Freq: Two times a day (BID) | ORAL | Status: DC
  Administered 2022-09-16 – 2022-09-17 (×3): 1 via ORAL
  Filled 2022-09-15 (×3): qty 1

## 2022-09-15 NOTE — Progress Notes (Addendum)
      [address]       [place] [address]             [phone]        11 Days Post-Op Procedure(s) (LRB): CORONARY ARTERY BYPASS GRAFTING (CABG) X 3 USING LEFT INTERNAL MAMMARY AND BILATERAL LEG GREATER SAPHENOUS VEIN VARVESTED ENDOSCOPICALLY (N/A) TRANSESOPHAGEAL ECHOCARDIOGRAM (TEE) (N/A)  Subjective: Patient just waking up this am.  Objective: Vital signs in last 24 hours: Temp:  [98.7 F (37.1 C)-99.3 F (37.4 C)] 98.8 F (37.1 C) (11/10 0402) Pulse Rate:  [60-79] 76 (11/10 0254) Cardiac Rhythm: Normal sinus rhythm (11/09 1900) Resp:  [16-25] 20 (11/10 0633) BP: (147-180)/(48-66) 173/57 (11/10 0633) SpO2:  [94 %-97 %] 96 % (11/10 2706) Weight:  [53 kg] 53 kg (11/10 0413)  Pre op weight  49.6 kg Current Weight  09/15/22 53 kg       Intake/Output from previous day: 11/09 0701 - 11/10 0700 In: 720 [P.O.:720] Out: -    Physical Exam:  Cardiovascular: RRR Pulmonary: Clear to auscultation bilaterally Abdomen: Soft, non tender, bowel sounds present. Extremities: No lower extremity edema. Wounds: Clean and dry.  No erythema or signs of infection.  Lab Results: CBC:No results for input(s): "WBC", "HGB", "HCT", "PLT" in the last 72 hours. BMET:  Recent Labs    09/13/22 0153 09/15/22 0108  NA 138 136  K 4.1 3.8  CL 112* 111  CO2 18* 16*  GLUCOSE 87 165*  BUN 24* 20  CREATININE 1.31* 1.30*  CALCIUM 8.5* 8.5*     PT/INR:  Lab Results  Component Value Date   INR 1.4 (H) 09/04/2022   INR 1.0 09/01/2022   ABG:  INR: Will add last result for INR, ABG once components are confirmed Will add last 4 CBG results once components are confirmed  Assessment/Plan: CV-Continues to have uncontrolled hypertension, despite medication titration. SBP in the 170's this am.  On Amlodipine 10 mg daily, Clonidine  0.1 mg bid, Hydralazine 100 mg tid, Losartan 50 mg bid, Coreg 12.5 mg bid. May need to ask cardiology to re evaluate Pulmonary-On room  air. Encourage incentive spirometer. History of hypothyroidism-continue Levothyroxine 25 mcg daily DM-CBGs 114/221/137. On Insulin. Had hypoglycemia earlier this am. Likely will change SS 5. AKI-Creatinine stable at 1.3 this am. Creatinine 1.24 prior to surgery. 6. Supplement potassium 7. Remove chest tube sutures in am   Donielle M ZimmermanPA-C 6:55 AM   Blood pressure remains wildly out of control. We will reconsult hypertension service. Discharge limited by severe hypertension.  Jasmine Keith

## 2022-09-15 NOTE — Progress Notes (Signed)
Mobility Specialist Progress Note:   09/15/22 1452  Mobility  Activity Ambulated with assistance in hallway;Ambulated with assistance to bathroom  Level of Assistance Contact guard assist, steadying assist  Assistive Device Front wheel walker  Distance Ambulated (ft) 180 ft  Activity Response Tolerated well  $Mobility charge 1 Mobility   Pt received in bed willing to participate in mobility. No complaints of pain. Left in bed with call bell in reach and all needs met.   [facility] Surveyor, mining Chat only

## 2022-09-15 NOTE — Progress Notes (Signed)
Rounding Note    Patient Name: Jasmine Keith Date of Encounter: 09/15/2022  [facility] Cardiologist: Jenean Lindau, MD   Subjective   No chest pain or shortness of breath.  Blood pressure improving.   Seems patient is very anxious and as baseline.  Inpatient Medications    Scheduled Meds:  amLODipine  10 mg Oral Daily   aspirin EC  325 mg Oral Daily   bisacodyl  10 mg Oral Daily   Or   bisacodyl  10 mg Rectal Daily   busPIRone  7.5 mg Oral BID   carvedilol  12.5 mg Oral BID WC   Chlorhexidine Gluconate Cloth  6 each Topical Daily   cloNIDine  0.1 mg Oral BID   dicyclomine  10 mg Oral TID AC & HS   docusate sodium  200 mg Oral Daily   enoxaparin (LOVENOX) injection  30 mg Subcutaneous QHS   gabapentin  300 mg Oral BID   hydrALAZINE  100 mg Oral TID   insulin aspart  0-24 Units Subcutaneous TID WC   insulin detemir  8 Units Subcutaneous Daily   levothyroxine  25 mcg Oral Q0600   losartan  50 mg Oral BID   pantoprazole  40 mg Oral Daily   pneumococcal 20-valent conjugate vaccine  0.5 mL Intramuscular Tomorrow-1000   sodium chloride flush  3 mL Intravenous Q12H   venlafaxine XR  75 mg Oral Q breakfast   Continuous Infusions:  sodium chloride     PRN Meds: sodium chloride, hydrALAZINE, ipratropium-albuterol, metoprolol tartrate, ondansetron (ZOFRAN) IV, mouth rinse, oxyCODONE, sodium chloride flush, traMADol   Vital Signs    Vitals:   09/15/22 0633 09/15/22 0715 09/15/22 0957 09/15/22 1136  BP: (!) 173/57 (!) 156/93 (!) 165/72 (!) 144/68  Pulse: 76 74  68  Resp: '20 19  17  '$ Temp:    98 F (36.7 C)  TempSrc:    Oral  SpO2: 96% 97%  99%  Weight:      Height:        Intake/Output Summary (Last 24 hours) at 09/15/2022 1220 Last data filed at 09/15/2022 0945 Gross per 24 hour  Intake 950 ml  Output --  Net 950 ml      09/15/2022    4:13 AM 09/14/2022    6:02 AM 09/13/2022    5:00 AM  Last 3 Weights  Weight (lbs) 116 lb 13.5 oz 117 lb  8.1 oz 118 lb 9.7 oz  Weight (kg) 53 kg 53.3 kg 53.8 kg      Telemetry    Sinus rhythm HR 60-70s - Personally Reviewed  ECG    N/A  Physical Exam   GEN: No acute distress.   Neck: No JVD Cardiac: RRR, no murmurs, rubs, or gallops.  Respiratory: Clear to auscultation bilaterally. GI: Soft, nontender, non-distended  MS: No edema; No deformity. Neuro:  Nonfocal  Psych: Normal affect   Labs    High Sensitivity Troponin:  No results for input(s): "TROPONINIHS" in the last 720 hours.   Chemistry Recent Labs  Lab 09/09/22 0712 09/11/22 0420 09/12/22 0453 09/13/22 0153 09/15/22 0108  NA 135   < > 139 138 136  K 4.8   < > 4.6 4.1 3.8  CL 108   < > 113* 112* 111  CO2 15*   < > 18* 18* 16*  GLUCOSE 182*   < > 70 87 165*  BUN 41*   < > 25* 24* 20  CREATININE 1.51*   < >  1.29* 1.31* 1.30*  CALCIUM 7.9*   < > 8.9 8.5* 8.5*  MG 1.7  --   --   --   --   GFRNONAA 37*   < > 45* 44* 45*  ANIONGAP 12   < > '8 8 9   '$ < > = values in this interval not displayed.    Lipids No results for input(s): "CHOL", "TRIG", "HDL", "LABVLDL", "LDLCALC", "CHOLHDL" in the last 168 hours.  Hematology Recent Labs  Lab 09/09/22 0712  WBC 9.4  RBC 3.36*  HGB 9.1*  HCT 27.5*  MCV 81.8  MCH 27.1  MCHC 33.1  RDW 15.6*  PLT 302   Thyroid No results for input(s): "TSH", "FREET4" in the last 168 hours.  BNPNo results for input(s): "BNP", "PROBNP" in the last 168 hours.  DDimer No results for input(s): "DDIMER" in the last 168 hours.   Radiology    No results found.  Cardiac Studies   Intra op TEE 09/04/22 OST-OP IMPRESSIONS  _ Left Ventricle: has hyperdynamic systolic function. The cavity size was  normal. The wall motion is normal.  _ Right Ventricle: The right ventricle appears unchanged from pre-bypass.  _ Aorta: The aorta appears unchanged from pre-bypass.  _ Left Atrial Appendage: The left atrial appendage appears unchanged from  pre-bypass.  _ Aortic Valve: The aortic valve  appears unchanged from pre-bypass.  _ Mitral Valve: There is mild regurgitation.  _ Tricuspid Valve: The tricuspid valve appears unchanged from pre-bypass.  _ Pulmonic Valve: The pulmonic valve appears unchanged from pre-bypass.  _ Interatrial Septum: The interatrial septum appears unchanged from  pre-bypass.  _ Pericardium: The pericardium appears unchanged from pre-bypass.  _ Comments: Post-bypass images reviewed with surgeon.   PRE-OP FINDINGS   Left Ventricle: The left ventricle has hyperdynamic systolic function,  with an ejection fraction of >65%. The cavity size was normal. No evidence  of left ventricular regional wall motion abnormalities. Concentric left  ventricular hypertrophy.   Patient Profile     71 y.o. 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  Cardiology signed off 11/8.  Again asked for blood pressure management.  Assessment & Plan    Hypertension - PT with history of hypertension and historically uncontrolled on multiple drug. -Fluctuating blood pressure in past 2 days 140-170s/50-70s. -During my evaluation blood pressure was 144/68.  Patient appeared very anxious and stress during conversation.  Educated for about 20 minutes with improved blood pressure to 140/22. -I think her blood pressure is improved with movement and underlying stress resolution -Continue current medication with amlodipine 10 mg daily, carvedilol 12.5 mg twice daily, clonidine 0.1 mg twice daily, hydralazine 100 mg 3 times daily, losartan 50 mg twice daily -Reviewed how to take her blood pressure -Recommended to keep a log of blood pressure and bring readings for review during follow-up -May need a secondary work-up in outpatient setting if blood pressure elevated persistently or consideration of 24-hour monitor   2. CAD sbp  CABG - Continue ASA and BB - Intolerance to statin   3. HLD - Intolerance to stain  -Outpatient lipid clinic evaluation -Consider adding Zetia    For questions or updates, please contact [place] Please consult [email or web address] for contact info under        SignedLeanor Kail, PA  09/15/2022, 12:20 PM

## 2022-09-15 NOTE — Progress Notes (Signed)
Remove sutures per order. Pt tolerated well.   Lavenia Atlas, RN

## 2022-09-15 NOTE — Inpatient Diabetes Management (Signed)
Inpatient Diabetes Program Recommendations  AACE/ADA: New Consensus Statement on Inpatient Glycemic Control (2015)  Target Ranges:  Prepandial:   less than 140 mg/dL      Peak postprandial:   less than 180 mg/dL (1-2 hours)      Critically ill patients:  140 - 180 mg/dL   Lab Results  Component Value Date   GLUCAP 137 (H) 09/15/2022   HGBA1C 6.9 (H) 09/01/2022    Review of Glycemic Control  Latest Reference Range & Units 09/13/22 08:17 09/13/22 10:47 09/13/22 15:39 09/13/22 20:57 09/13/22 21:55 09/14/22 06:04 09/14/22 06:48  Glucose-Capillary 70 - 99 mg/dL 161 (H) 119 (H) 110 (H) 57 (L) 76 69 (L) 181 (H)    Latest Reference Range & Units 09/14/22 06:04 09/14/22 06:48 09/14/22 11:30 09/14/22 16:35 09/14/22 17:02 09/14/22 21:00 09/15/22 06:04  Glucose-Capillary 70 - 99 mg/dL 69 (L) 181 (H) 155 (H) 47 (L) 114 (H) 221 (H) 137 (H)   Diabetes history: DM 2 Outpatient Diabetes medications: Levemir 15 units QHS, Metformin 1000 mg BID  Current orders for Inpatient glycemic control:  Levemir 8 units Novolog 0-24 units Q4  Inpatient Diabetes Program Recommendations:   Note hypoglycemia  -   Reduce Novolog Correction to Moderate 0-15 units    Thanks,  Tama Headings RN, MSN, BC-ADM Inpatient Diabetes Coordinator Team Pager [phone] (8a-5p)

## 2022-09-15 NOTE — Progress Notes (Signed)
Pt declined ambulation due to wanting to walk at 3pm, pt was educated on sternal precautions, wt restrictions, no baths/daily wash-ups, site care, S/S of infection, continued IS use, ex guidelines (progressive walking), heart healthy and diabetic diet, risk factors (smoking, reducing cholesterol) and CRPII. Pt will be referred to [facility].   Christen Bame, MS EP 09/15/2022 2:15 PM

## 2022-09-16 DIAGNOSIS — Z789 Other specified health status: Secondary | ICD-10-CM

## 2022-09-16 DIAGNOSIS — I25810 Atherosclerosis of coronary artery bypass graft(s) without angina pectoris: Secondary | ICD-10-CM

## 2022-09-16 LAB — GLUCOSE, CAPILLARY
Glucose-Capillary: 103 mg/dL — ABNORMAL HIGH (ref 70–99)
Glucose-Capillary: 136 mg/dL — ABNORMAL HIGH (ref 70–99)
Glucose-Capillary: 154 mg/dL — ABNORMAL HIGH (ref 70–99)
Glucose-Capillary: 126 mg/dL — ABNORMAL HIGH (ref 70–99)

## 2022-09-16 NOTE — Progress Notes (Signed)
Progress Note  Patient Name: Jasmine Keith Date of Encounter: 09/16/2022  Primary Cardiologist: Jenean Lindau, MD  Subjective   No acute events overnight.  Losartan was discontinued and Entresto was started this morning.  Patient has been having neck pain for the last couple of days and stated tramadol was not working and oxycodone is only medication is working for her.  Inpatient Medications    Scheduled Meds:  amLODipine  10 mg Oral Daily   aspirin EC  325 mg Oral Daily   bisacodyl  10 mg Oral Daily   Or   bisacodyl  10 mg Rectal Daily   busPIRone  7.5 mg Oral BID   carvedilol  25 mg Oral BID WC   Chlorhexidine Gluconate Cloth  6 each Topical Daily   cloNIDine  0.1 mg Oral BID   dicyclomine  10 mg Oral TID AC & HS   docusate sodium  200 mg Oral Daily   enoxaparin (LOVENOX) injection  30 mg Subcutaneous QHS   gabapentin  300 mg Oral BID   hydrALAZINE  100 mg Oral TID   insulin aspart  0-24 Units Subcutaneous TID WC   insulin detemir  8 Units Subcutaneous Daily   levothyroxine  25 mcg Oral Q0600   pantoprazole  40 mg Oral Daily   pneumococcal 20-valent conjugate vaccine  0.5 mL Intramuscular Tomorrow-1000   sacubitril-valsartan  1 tablet Oral BID   sodium chloride flush  3 mL Intravenous Q12H   venlafaxine XR  75 mg Oral Q breakfast   Continuous Infusions:  sodium chloride     PRN Meds: sodium chloride, hydrALAZINE, ipratropium-albuterol, metoprolol tartrate, ondansetron (ZOFRAN) IV, mouth rinse, oxyCODONE, sodium chloride flush, traMADol   Vital Signs    Vitals:   09/15/22 2328 09/16/22 0434 09/16/22 0709 09/16/22 0904  BP: (!) 132/56 (!) 168/53 (!) 155/59 (!) 161/58  Pulse: 62 68 (!) 41   Resp: 15 19 (!) 22   Temp: 98.9 F (37.2 C) 98.4 F (36.9 C) 98.9 F (37.2 C)   TempSrc: Oral Oral Oral   SpO2: 96% 95% 95%   Weight:  52.7 kg    Height:        Intake/Output Summary (Last 24 hours) at 09/16/2022 1147 Last data filed at 09/15/2022  2114 Gross per 24 hour  Intake 483 ml  Output --  Net 483 ml   Filed Weights   09/14/22 0602 09/15/22 0413 09/16/22 0434  Weight: 53.3 kg 53 kg 52.7 kg    Telemetry     Personally reviewed, HR controlled, sinus bradycardia  ECG    An ECG dated 09/07/2022 was personally reviewed today and demonstrated:  Sinus rhythm  Physical Exam   GEN: No acute distress.   Neck: No JVD, pain in her left side of her neck Cardiac: RRR, no murmur, rub, or gallop.  Respiratory: Nonlabored. Clear to auscultation bilaterally. GI: Soft, nontender, bowel sounds present. MS: No edema; No deformity. Neuro:  Nonfocal. Psych: Alert and oriented x 3. Normal affect.  Labs    Chemistry Recent Labs  Lab 09/12/22 0453 09/13/22 0153 09/15/22 0108  NA 139 138 136  K 4.6 4.1 3.8  CL 113* 112* 111  CO2 18* 18* 16*  GLUCOSE 70 87 165*  BUN 25* 24* 20  CREATININE 1.29* 1.31* 1.30*  CALCIUM 8.9 8.5* 8.5*  GFRNONAA 45* 44* 45*  ANIONGAP '8 8 9     '$ HematologyNo results for input(s): "WBC", "RBC", "HGB", "HCT", "MCV", "MCH", "MCHC", "RDW", "  PLT" in the last 168 hours.  Cardiac EnzymesNo results for input(s): "TROPONINIHS" in the last 720 hours.  BNPNo results for input(s): "BNP", "PROBNP" in the last 168 hours.   DDimerNo results for input(s): "DDIMER" in the last 168 hours.   Radiology    No results found.  Cardiac Studies  Echo LVEF 70 to 75%   Assessment & Plan   Patient is a 71 year old F known to have CAD, HTN, DM, tobacco abuse who was admitted on 09/04/2022 for CABG surgery.  Cardiology was consulted on 09/12/2022 for hypertension management.  #HTN, poorly controlled -Patient stated that she has been having neck pain for the last couple of days and this could be contributing to her elevated blood pressure.  Manage her neck pain with opioids and reassess her BP. Oxycodone seems to improve her pain but not tramadol. In addition, anxiety might also be causing elevated blood  pressures. -Continue amlodipine 10 mg once daily, carvedilol 25 mg twice daily, hydralazine 100 mg 3 times daily and clonidine 0.1 mg twice daily.  Patient was on losartan yesterday which was switched to Entresto 49-51 mg twice daily this morning.  #CAD status post CABG -Continue aspirin 81 mg once daily. Statin intolerant. Referral to outpatient lipid clinic.  I have spent a total of 37 minutes with patient reviewing chart, telemetry, EKGs, labs and examining patient as well as establishing an assessment and plan that was discussed with the patient.  > 50% of time was spent in direct patient care.     Signed, Chalmers Guest, MD  09/16/2022, 11:47 AM

## 2022-09-16 NOTE — Progress Notes (Addendum)
      [address]       [place] [address]             [phone]       12 Days Post-Op Procedure(s) (LRB): CORONARY ARTERY BYPASS GRAFTING (CABG) X 3 USING LEFT INTERNAL MAMMARY AND BILATERAL LEG GREATER SAPHENOUS VEIN VARVESTED ENDOSCOPICALLY (N/A) TRANSESOPHAGEAL ECHOCARDIOGRAM (TEE) (N/A)  Subjective:  Patient hoping to go home soon.  She has some neck pain this morning.    Objective: Vital signs in last 24 hours: Temp:  [97.7 F (36.5 C)-98.9 F (37.2 C)] 98.9 F (37.2 C) (11/11 0709) Pulse Rate:  [41-68] 41 (11/11 0709) Cardiac Rhythm: Normal sinus rhythm (11/10 1900) Resp:  [15-22] 22 (11/11 0709) BP: (132-168)/(48-72) 155/59 (11/11 0709) SpO2:  [95 %-99 %] 95 % (11/11 0709) Weight:  [52.7 kg] 52.7 kg (11/11 0434)  Intake/Output from previous day: 11/10 0701 - 11/11 0700 In: 833 [P.O.:830; I.V.:3] Out: -   General appearance: alert, cooperative, and no distress Heart: regular rate and rhythm Lungs: clear to auscultation bilaterally Abdomen: soft, non-tender; bowel sounds normal; no masses,  no organomegaly Extremities: extremities normal, atraumatic, no cyanosis or edema Wound: clean and dry  Lab Results: No results for input(s): "WBC", "HGB", "HCT", "PLT" in the last 72 hours. BMET:  Recent Labs    09/15/22 0108  NA 136  K 3.8  CL 111  CO2 16*  GLUCOSE 165*  BUN 20  CREATININE 1.30*  CALCIUM 8.5*    PT/INR: No results for input(s): "LABPROT", "INR" in the last 72 hours. ABG    Component Value Date/Time   PHART 7.503 (H) 09/07/2022 1256   HCO3 19.7 (L) 09/07/2022 1256   TCO2 20 (L) 09/07/2022 1256   ACIDBASEDEF 3.0 (H) 09/07/2022 1256   O2SAT 93 09/07/2022 1256   CBG (last 3)  Recent Labs    09/15/22 1556 09/15/22 2038 09/16/22 0617  GLUCAP 149* 88 103*    Assessment/Plan: S/P Procedure(s) (LRB): CORONARY ARTERY BYPASS GRAFTING (CABG) X 3 USING LEFT INTERNAL MAMMARY AND BILATERAL LEG GREATER SAPHENOUS VEIN  VARVESTED ENDOSCOPICALLY (N/A) TRANSESOPHAGEAL ECHOCARDIOGRAM (TEE) (N/A)  CV- NSR, HTN is big issue, patient is currently on Coreg at 25 mg BID, Norvasc 10 mg daily, Clonidine 0.1 mg BID, Hydralazine 100 mg TID, and per Cardiology recommendations Delene Loll will be started to at 49/51--- patient likely needs RAS workup.Marland Kitchen she will need to follow up in HTN clinic Pulm-no acute issues, continue IS Hypothyroidism- synthroid at 25 mcg daily DM- sugars are well controlled continue current regimen Dispo- overall patient is doing well from surgical standpoint,  however BP remains uncontrolled despite multiple anti-hypertensive drugs... she likely needs RAS workup which can be done at HTN clinic.Marland Kitchen she will be ready for d/c once BP can be controlled   LOS: 12 days    Ellwood Handler, PA-C 09/16/2022   Chart reviewed, patient examined, agree with above. Still complaining of neck pain that feels like spasms. Likely related to sternotomy and laying in bed. Heat packs have helped. Her BP seems better this afternoon since [place] started.

## 2022-09-16 NOTE — Progress Notes (Signed)
Mobility Specialist - Progress Note   09/16/22 1631  Mobility  Activity Ambulated with assistance in hallway  Level of Assistance Contact guard assist, steadying assist  Assistive Device Front wheel walker  Distance Ambulated (ft) 80 ft  Activity Response Tolerated poorly  Mobility Referral Yes  $Mobility charge 1 Mobility   Pt received in bed and agreeable to mobility. Pt required x1 seated break and x1 standing break d/t neck pain and knee weakness. Pt was returned to bed with all needs met.   Franki Monte  Mobility Specialist Please contact via Solicitor or Rehab office at [phone]

## 2022-09-17 ENCOUNTER — Other Ambulatory Visit: Payer: Self-pay | Admitting: Physician Assistant

## 2022-09-17 ENCOUNTER — Other Ambulatory Visit: Payer: Self-pay | Admitting: Medical

## 2022-09-17 LAB — GLUCOSE, CAPILLARY
Glucose-Capillary: 137 mg/dL — ABNORMAL HIGH (ref 70–99)
Glucose-Capillary: 115 mg/dL — ABNORMAL HIGH (ref 70–99)

## 2022-09-17 MED ORDER — CLONIDINE HCL 0.1 MG PO TABS: 0.1000 mg | ORAL_TABLET | Freq: Two times a day (BID) | ORAL | 3 refills | Status: DC

## 2022-09-17 MED ORDER — SACUBITRIL-VALSARTAN 49-51 MG PO TABS: 1.0000 | ORAL_TABLET | Freq: Two times a day (BID) | ORAL | 3 refills | Status: DC

## 2022-09-17 MED ORDER — GABAPENTIN 50 MG PO TABS: 300.0000 mg | ORAL_TABLET | Freq: Two times a day (BID) | ORAL | 3 refills | Status: DC

## 2022-09-17 MED ORDER — ASPIRIN 325 MG PO TBEC: 325.0000 mg | DELAYED_RELEASE_TABLET | Freq: Every day | ORAL | Status: DC

## 2022-09-17 MED ORDER — HYDRALAZINE HCL 100 MG PO TABS: 100.0000 mg | ORAL_TABLET | Freq: Three times a day (TID) | ORAL | 3 refills | Status: DC

## 2022-09-17 MED ORDER — CARVEDILOL 25 MG PO TABS: 25.0000 mg | ORAL_TABLET | Freq: Two times a day (BID) | ORAL | 3 refills | Status: DC

## 2022-09-17 MED ORDER — OXYCODONE HCL 5 MG PO TABS: 5.0000 mg | ORAL_TABLET | ORAL | 0 refills | Status: DC | PRN

## 2022-09-17 NOTE — Progress Notes (Signed)
Progress Note  Patient Name: Jasmine Keith Date of Encounter: 09/17/2022  Primary Cardiologist: Jenean Lindau, MD  Subjective   No acute events overnight.  Blood pressures are better controlled after starting Entresto.  Patient is being discharged home today.  Inpatient Medications    Scheduled Meds:  amLODipine  10 mg Oral Daily   aspirin EC  325 mg Oral Daily   bisacodyl  10 mg Oral Daily   Or   bisacodyl  10 mg Rectal Daily   busPIRone  7.5 mg Oral BID   carvedilol  25 mg Oral BID WC   Chlorhexidine Gluconate Cloth  6 each Topical Daily   cloNIDine  0.1 mg Oral BID   dicyclomine  10 mg Oral TID AC & HS   docusate sodium  200 mg Oral Daily   enoxaparin (LOVENOX) injection  30 mg Subcutaneous QHS   gabapentin  300 mg Oral BID   hydrALAZINE  100 mg Oral TID   insulin aspart  0-24 Units Subcutaneous TID WC   insulin detemir  8 Units Subcutaneous Daily   levothyroxine  25 mcg Oral Q0600   pantoprazole  40 mg Oral Daily   pneumococcal 20-valent conjugate vaccine  0.5 mL Intramuscular Tomorrow-1000   sacubitril-valsartan  1 tablet Oral BID   sodium chloride flush  3 mL Intravenous Q12H   venlafaxine XR  75 mg Oral Q breakfast   Continuous Infusions:  sodium chloride     PRN Meds: sodium chloride, hydrALAZINE, ipratropium-albuterol, metoprolol tartrate, ondansetron (ZOFRAN) IV, mouth rinse, oxyCODONE, sodium chloride flush, traMADol   Vital Signs    Vitals:   09/17/22 0500 09/17/22 0720 09/17/22 1000 09/17/22 1100  BP:  (!) 147/60 (!) 155/66 (!) 124/106  Pulse:  61    Resp:  18    Temp:  98.8 F (37.1 C)    TempSrc:  Oral    SpO2:  95%    Weight: 53.5 kg     Height:        Intake/Output Summary (Last 24 hours) at 09/17/2022 1217 Last data filed at 09/16/2022 2328 Gross per 24 hour  Intake 3 ml  Output 0 ml  Net 3 ml   Filed Weights   09/15/22 0413 09/16/22 0434 09/17/22 0500  Weight: 53 kg 52.7 kg 53.5 kg    Telemetry     Personally  reviewed, HR controlled, sinus bradycardia  ECG    An ECG dated 09/07/2022 was personally reviewed today and demonstrated:  Sinus rhythm  Physical Exam   GEN: No acute distress.   Neck: No JVD, pain in her left side of her neck Cardiac: RRR, no murmur, rub, or gallop.  Respiratory: Nonlabored. Clear to auscultation bilaterally. GI: Soft, nontender, bowel sounds present. MS: No edema; No deformity. Neuro:  Nonfocal. Psych: Alert and oriented x 3. Normal affect.  Labs    Chemistry Recent Labs  Lab 09/12/22 0453 09/13/22 0153 09/15/22 0108  NA 139 138 136  K 4.6 4.1 3.8  CL 113* 112* 111  CO2 18* 18* 16*  GLUCOSE 70 87 165*  BUN 25* 24* 20  CREATININE 1.29* 1.31* 1.30*  CALCIUM 8.9 8.5* 8.5*  GFRNONAA 45* 44* 45*  ANIONGAP '8 8 9     '$ HematologyNo results for input(s): "WBC", "RBC", "HGB", "HCT", "MCV", "MCH", "MCHC", "RDW", "PLT" in the last 168 hours.  Cardiac EnzymesNo results for input(s): "TROPONINIHS" in the last 720 hours.  BNPNo results for input(s): "BNP", "PROBNP" in the last 168 hours.  DDimerNo results for input(s): "DDIMER" in the last 168 hours.   Radiology    No results found.  Cardiac Studies  Echo LVEF 70 to 75%   Assessment & Plan   Patient is a 71 year old F known to have CAD, HTN, DM, tobacco abuse who was admitted on 09/04/2022 for CABG surgery.  Cardiology was consulted on 09/12/2022 for hypertension management.  #HTN, controlled -Continue current antihypertensive medications, amlodipine 10 mg once daily, carvedilol 25 mg twice daily, hydralazine 100 mg 3 times daily, clonidine 0.1 mg twice daily and Entresto 49-51 mg twice daily  #CAD status post CABG -Continue aspirin 81 mg once daily.  Statin intolerant.  Referral to outpatient lipid clinic.  I have spent a total of 37 minutes with patient reviewing chart, telemetry, EKGs, labs and examining patient as well as establishing an assessment and plan that was discussed with the patient.  >  50% of time was spent in direct patient care.   [facility] will sign off.   Medication Recommendations: Amlodipine 10 mg once daily, carvedilol 25 mg twice daily, hydralazine 100 mg 3 times daily, clonidine 0.1 mg twice daily and Entresto 49-51 mg twice daily Other recommendations (labs, testing, etc): Referral to outpatient lipid clinic Follow up as an outpatient: Cardiology clinic outpatient follow-up    Signed, Chalmers Guest, MD  09/17/2022, 12:17 PM

## 2022-09-17 NOTE — Progress Notes (Signed)
      [address]       [place] [address]             [phone]      13 Days Post-Op Procedure(s) (LRB): CORONARY ARTERY BYPASS GRAFTING (CABG) X 3 USING LEFT INTERNAL MAMMARY AND BILATERAL LEG GREATER SAPHENOUS VEIN VARVESTED ENDOSCOPICALLY (N/A) TRANSESOPHAGEAL ECHOCARDIOGRAM (TEE) (N/A)  Subjective:  Patient continues to have neck discomfort.  She does not complain of urinary symptoms.  She has moved her bowels.  Objective: Vital signs in last 24 hours: Temp:  [98.6 F (37 C)-100.8 F (38.2 C)] 98.8 F (37.1 C) (11/12 0720) Pulse Rate:  [56-61] 61 (11/12 0720) Cardiac Rhythm: Normal sinus rhythm (11/11 1900) Resp:  [15-21] 18 (11/12 0720) BP: (133-161)/(51-78) 147/60 (11/12 0720) SpO2:  [94 %-100 %] 95 % (11/12 0720) Weight:  [53.5 kg] 53.5 kg (11/12 0500)  Intake/Output from previous day: 11/11 0701 - 11/12 0700 In: 243 [P.O.:240; I.V.:3] Out: 0   General appearance: alert, cooperative, and no distress Heart: regular rate and rhythm Lungs: clear to auscultation bilaterally Abdomen: soft, non-tender; bowel sounds normal; no masses,  no organomegaly Extremities: extremities normal, atraumatic, no cyanosis or edema Wound: clean and dry, no evidence of infection  Lab Results: No results for input(s): "WBC", "HGB", "HCT", "PLT" in the last 72 hours. BMET:  Recent Labs    09/15/22 0108  NA 136  K 3.8  CL 111  CO2 16*  GLUCOSE 165*  BUN 20  CREATININE 1.30*  CALCIUM 8.5*    PT/INR: No results for input(s): "LABPROT", "INR" in the last 72 hours. ABG    Component Value Date/Time   PHART 7.503 (H) 09/07/2022 1256   HCO3 19.7 (L) 09/07/2022 1256   TCO2 20 (L) 09/07/2022 1256   ACIDBASEDEF 3.0 (H) 09/07/2022 1256   O2SAT 93 09/07/2022 1256   CBG (last 3)  Recent Labs    09/16/22 2116 09/17/22 0626 09/17/22 0806  GLUCAP 126* 137* 115*    Assessment/Plan: S/P Procedure(s) (LRB): CORONARY ARTERY BYPASS GRAFTING (CABG) X 3 USING  LEFT INTERNAL MAMMARY AND BILATERAL LEG GREATER SAPHENOUS VEIN VARVESTED ENDOSCOPICALLY (N/A) TRANSESOPHAGEAL ECHOCARDIOGRAM (TEE) (N/A)  CV- maintaining NSR, HTN much improved- continue Norvasc, Coreg, Catapres, Hydralazine, Entresto Pulm- no issues, continue IS Hypothyroidism- continue synthroid ID- brief low grade temp, no leukocytosis, no evidence of surgical wound infection, denies dysuria, likely due to atelectasis DM-sugars controlled Dispo- patient stable, HTN much improved with addition of entresto, will d/c home today   LOS: 13 days    Ellwood Handler, PA-C 09/17/2022

## 2022-09-18 ENCOUNTER — Other Ambulatory Visit: Payer: Self-pay

## 2022-09-18 DIAGNOSIS — Z951 Presence of aortocoronary bypass graft: Secondary | ICD-10-CM

## 2022-09-18 DIAGNOSIS — E088 Diabetes mellitus due to underlying condition with unspecified complications: Secondary | ICD-10-CM

## 2022-09-18 DIAGNOSIS — I10 Essential (primary) hypertension: Secondary | ICD-10-CM

## 2022-09-19 ENCOUNTER — Encounter: Payer: Self-pay | Admitting: *Deleted

## 2022-09-19 ENCOUNTER — Telehealth: Payer: Self-pay | Admitting: *Deleted

## 2022-09-19 ENCOUNTER — Telehealth: Payer: Self-pay

## 2022-09-19 ENCOUNTER — Other Ambulatory Visit: Payer: Self-pay

## 2022-09-19 MED ORDER — GABAPENTIN 300 MG PO CAPS: 300.0000 mg | ORAL_CAPSULE | Freq: Two times a day (BID) | ORAL | 0 refills | Status: DC

## 2022-09-19 NOTE — Telephone Encounter (Signed)
Patient's husband, Thayer Jew contacted the office concerned about medications that were not sent into the [facility] in [place]. He states that he did not get prescription Gabapentin and Hydralazine sent in. Advised that patient's PCP sent in refills, but upon further review, medication did not match what was prescribed at discharge from the hospital. Pharmacy states that 50 mg tablets of Gabapentin is not available and RX would need to be changed. New prescription sent in to reflect prescription that was sent at discharge. Patient acknowledged receipt and advised to contact the pharmacy again to see if they have the prescription Hydralazine that was sent in by patient's PCP as well.

## 2022-09-19 NOTE — Progress Notes (Signed)
  Care Coordination   Note   09/19/2022 Name: [name] MRN: [id number] DOB: 05/29/1951  Jasmine Keith is a 71 y.o. year old female who sees [place], Percell Miller, [place] for primary care. I reached out to Jasmine Keith by phone today to offer care coordination services.  [name] was given information about Care Coordination services today including:   The Care Coordination services include support from the care team which includes your Nurse Coordinator, Clinical Social Worker, or Pharmacist.  The Care Coordination team is here to help remove barriers to the health concerns and goals most important to you. Care Coordination services are voluntary, and the patient may decline or stop services at any time by request to their care team member.   Care Coordination Consent Status: Patient agreed to services and verbal consent obtained.   Follow up plan:  Telephone appointment with care coordination team member scheduled for:  10/06/2022  Encounter Outcome:  Pt. Scheduled from referral  Julian Hy, [place] Direct Dial: [phone]

## 2022-09-19 NOTE — Patient Outreach (Addendum)
  Care Coordination [facility] Note Transition Care Management Unsuccessful Follow-up Telephone Call  Date of discharge and from where:  Sunday, 09/17/22- [place]; CAD; CABG x 3  Attempts:  1st Attempt  Reason for unsuccessful TCM follow-up call:  Left voice message  Oneta Rack, RN, BSN, CCRN Alumnus RN CM Care Coordination/ Transition of [place] Management [phone]: direct office

## 2022-09-20 ENCOUNTER — Telehealth: Payer: Self-pay | Admitting: *Deleted

## 2022-09-20 ENCOUNTER — Encounter: Payer: Self-pay | Admitting: *Deleted

## 2022-09-20 NOTE — Patient Outreach (Signed)
  Care Coordination [facility] Note Transition Care Management Unsuccessful Follow-up Telephone Call  Date of discharge and from where:  Sunday, 09/17/22 [facility]- CAD; CABG x 3  Attempts:  2nd Attempt  Reason for unsuccessful TCM follow-up call:  No answer/busy  Attempted outreach x 2-- both times line noted busy; unable to leave voce message requesting call back  Oneta Rack, RN, BSN, CCRN Alumnus RN CM Care Coordination/ Transition of [place] Management [phone]: direct office

## 2022-09-21 ENCOUNTER — Emergency Department (HOSPITAL_COMMUNITY): Payer: Medicare Other

## 2022-09-21 ENCOUNTER — Inpatient Hospital Stay (HOSPITAL_COMMUNITY)
Admission: EM | Admit: 2022-09-21 | Discharge: 2022-09-27 | DRG: 308 | Disposition: A | Discharge status: Skilled Nursing Facility | Payer: Medicare Other | Attending: Family Medicine | Admitting: Family Medicine

## 2022-09-21 ENCOUNTER — Encounter: Payer: Self-pay | Admitting: *Deleted

## 2022-09-21 ENCOUNTER — Telehealth: Payer: Self-pay

## 2022-09-21 ENCOUNTER — Telehealth: Payer: Self-pay | Admitting: *Deleted

## 2022-09-21 ENCOUNTER — Encounter (HOSPITAL_COMMUNITY): Payer: Self-pay | Admitting: Emergency Medicine

## 2022-09-21 ENCOUNTER — Other Ambulatory Visit: Payer: Self-pay

## 2022-09-21 DIAGNOSIS — E782 Mixed hyperlipidemia: Secondary | ICD-10-CM | POA: Diagnosis present

## 2022-09-21 DIAGNOSIS — Z1152 Encounter for screening for COVID-19: Secondary | ICD-10-CM | POA: Diagnosis not present

## 2022-09-21 DIAGNOSIS — Z833 Family history of diabetes mellitus: Secondary | ICD-10-CM

## 2022-09-21 DIAGNOSIS — Z951 Presence of aortocoronary bypass graft: Secondary | ICD-10-CM | POA: Diagnosis not present

## 2022-09-21 DIAGNOSIS — Z79890 Hormone replacement therapy (postmenopausal): Secondary | ICD-10-CM

## 2022-09-21 DIAGNOSIS — Z7983 Long term (current) use of bisphosphonates: Secondary | ICD-10-CM

## 2022-09-21 DIAGNOSIS — R42 Dizziness and giddiness: Secondary | ICD-10-CM | POA: Diagnosis present

## 2022-09-21 DIAGNOSIS — R571 Hypovolemic shock: Secondary | ICD-10-CM | POA: Diagnosis present

## 2022-09-21 DIAGNOSIS — N1831 Chronic kidney disease, stage 3a: Secondary | ICD-10-CM | POA: Diagnosis present

## 2022-09-21 DIAGNOSIS — F419 Anxiety disorder, unspecified: Secondary | ICD-10-CM | POA: Diagnosis present

## 2022-09-21 DIAGNOSIS — R0789 Other chest pain: Secondary | ICD-10-CM | POA: Diagnosis not present

## 2022-09-21 DIAGNOSIS — R579 Shock, unspecified: Secondary | ICD-10-CM | POA: Diagnosis not present

## 2022-09-21 DIAGNOSIS — R001 Bradycardia, unspecified: Principal | ICD-10-CM | POA: Diagnosis present

## 2022-09-21 DIAGNOSIS — T447X5A Adverse effect of beta-adrenoreceptor antagonists, initial encounter: Secondary | ICD-10-CM | POA: Diagnosis present

## 2022-09-21 DIAGNOSIS — D6489 Other specified anemias: Secondary | ICD-10-CM | POA: Diagnosis present

## 2022-09-21 DIAGNOSIS — Z794 Long term (current) use of insulin: Secondary | ICD-10-CM

## 2022-09-21 DIAGNOSIS — E11649 Type 2 diabetes mellitus with hypoglycemia without coma: Secondary | ICD-10-CM | POA: Diagnosis present

## 2022-09-21 DIAGNOSIS — Z841 Family history of disorders of kidney and ureter: Secondary | ICD-10-CM

## 2022-09-21 DIAGNOSIS — M797 Fibromyalgia: Secondary | ICD-10-CM | POA: Diagnosis present

## 2022-09-21 DIAGNOSIS — N179 Acute kidney failure, unspecified: Secondary | ICD-10-CM | POA: Diagnosis present

## 2022-09-21 DIAGNOSIS — I129 Hypertensive chronic kidney disease with stage 1 through stage 4 chronic kidney disease, or unspecified chronic kidney disease: Secondary | ICD-10-CM | POA: Diagnosis present

## 2022-09-21 DIAGNOSIS — J9811 Atelectasis: Secondary | ICD-10-CM | POA: Diagnosis present

## 2022-09-21 DIAGNOSIS — I714 Abdominal aortic aneurysm, without rupture, unspecified: Secondary | ICD-10-CM | POA: Diagnosis present

## 2022-09-21 DIAGNOSIS — J449 Chronic obstructive pulmonary disease, unspecified: Secondary | ICD-10-CM | POA: Diagnosis present

## 2022-09-21 DIAGNOSIS — F17210 Nicotine dependence, cigarettes, uncomplicated: Secondary | ICD-10-CM | POA: Diagnosis present

## 2022-09-21 DIAGNOSIS — G9341 Metabolic encephalopathy: Secondary | ICD-10-CM | POA: Diagnosis present

## 2022-09-21 DIAGNOSIS — K529 Noninfective gastroenteritis and colitis, unspecified: Secondary | ICD-10-CM | POA: Diagnosis present

## 2022-09-21 DIAGNOSIS — F32A Depression, unspecified: Secondary | ICD-10-CM | POA: Diagnosis present

## 2022-09-21 DIAGNOSIS — E872 Acidosis, unspecified: Secondary | ICD-10-CM | POA: Diagnosis present

## 2022-09-21 DIAGNOSIS — E039 Hypothyroidism, unspecified: Secondary | ICD-10-CM | POA: Diagnosis present

## 2022-09-21 DIAGNOSIS — R54 Age-related physical debility: Secondary | ICD-10-CM | POA: Diagnosis present

## 2022-09-21 DIAGNOSIS — Z7984 Long term (current) use of oral hypoglycemic drugs: Secondary | ICD-10-CM

## 2022-09-21 DIAGNOSIS — E1122 Type 2 diabetes mellitus with diabetic chronic kidney disease: Secondary | ICD-10-CM | POA: Diagnosis present

## 2022-09-21 DIAGNOSIS — Z7982 Long term (current) use of aspirin: Secondary | ICD-10-CM

## 2022-09-21 DIAGNOSIS — E860 Dehydration: Secondary | ICD-10-CM | POA: Diagnosis present

## 2022-09-21 DIAGNOSIS — M329 Systemic lupus erythematosus, unspecified: Secondary | ICD-10-CM | POA: Diagnosis present

## 2022-09-21 DIAGNOSIS — R9431 Abnormal electrocardiogram [ECG] [EKG]: Secondary | ICD-10-CM | POA: Diagnosis not present

## 2022-09-21 DIAGNOSIS — K219 Gastro-esophageal reflux disease without esophagitis: Secondary | ICD-10-CM | POA: Diagnosis present

## 2022-09-21 DIAGNOSIS — G8929 Other chronic pain: Secondary | ICD-10-CM | POA: Diagnosis present

## 2022-09-21 DIAGNOSIS — Z79899 Other long term (current) drug therapy: Secondary | ICD-10-CM | POA: Diagnosis not present

## 2022-09-21 DIAGNOSIS — E875 Hyperkalemia: Secondary | ICD-10-CM | POA: Diagnosis present

## 2022-09-21 DIAGNOSIS — M810 Age-related osteoporosis without current pathological fracture: Secondary | ICD-10-CM | POA: Diagnosis present

## 2022-09-21 DIAGNOSIS — Z888 Allergy status to other drugs, medicaments and biological substances status: Secondary | ICD-10-CM

## 2022-09-21 DIAGNOSIS — I2510 Atherosclerotic heart disease of native coronary artery without angina pectoris: Secondary | ICD-10-CM | POA: Diagnosis present

## 2022-09-21 HISTORY — DX: Bradycardia, unspecified: R00.1

## 2022-09-21 LAB — COMPREHENSIVE METABOLIC PANEL
ALT: 20 U/L (ref 0–44)
AST: 28 U/L (ref 15–41)
Albumin: 2.5 g/dL — ABNORMAL LOW (ref 3.5–5.0)
Alkaline Phosphatase: 105 U/L (ref 38–126)
Anion gap: 11 (ref 5–15)
BUN: 59 mg/dL — ABNORMAL HIGH (ref 8–23)
CO2: 13 mmol/L — ABNORMAL LOW (ref 22–32)
Calcium: 8.7 mg/dL — ABNORMAL LOW (ref 8.9–10.3)
Chloride: 112 mmol/L — ABNORMAL HIGH (ref 98–111)
Creatinine, Ser: 2.25 mg/dL — ABNORMAL HIGH (ref 0.44–1.00)
GFR, Estimated: 23 mL/min — ABNORMAL LOW (ref >60)
Glucose, Bld: 174 mg/dL — ABNORMAL HIGH (ref 70–99)
Potassium: 5.2 mmol/L — ABNORMAL HIGH (ref 3.5–5.1)
Sodium: 136 mmol/L (ref 135–145)
Total Bilirubin: 0.4 mg/dL (ref 0.3–1.2)
Total Protein: 6.3 g/dL — ABNORMAL LOW (ref 6.5–8.1)

## 2022-09-21 LAB — LACTIC ACID, PLASMA
Lactic Acid, Venous: 2.6 mmol/L (ref 0.5–1.9)
Lactic Acid, Venous: 2.3 mmol/L (ref 0.5–1.9)

## 2022-09-21 LAB — CBC WITH DIFFERENTIAL/PLATELET
Abs Immature Granulocytes: 0.08 10*3/uL — ABNORMAL HIGH (ref 0.00–0.07)
Basophils Absolute: 0 10*3/uL (ref 0.0–0.1)
Basophils Relative: 0 %
Eosinophils Absolute: 0 10*3/uL (ref 0.0–0.5)
Eosinophils Relative: 0 %
HCT: 28.9 % — ABNORMAL LOW (ref 36.0–46.0)
Hemoglobin: 8.6 g/dL — ABNORMAL LOW (ref 12.0–15.0)
Immature Granulocytes: 1 %
Lymphocytes Relative: 9 %
Lymphs Abs: 0.8 10*3/uL (ref 0.7–4.0)
MCH: 25.8 pg — ABNORMAL LOW (ref 26.0–34.0)
MCHC: 29.8 g/dL — ABNORMAL LOW (ref 30.0–36.0)
MCV: 86.8 fL (ref 80.0–100.0)
Monocytes Absolute: 0.2 10*3/uL (ref 0.1–1.0)
Monocytes Relative: 2 %
Neutro Abs: 7.9 10*3/uL — ABNORMAL HIGH (ref 1.7–7.7)
Neutrophils Relative %: 88 %
Platelets: 655 10*3/uL — ABNORMAL HIGH (ref 150–400)
RBC: 3.33 MIL/uL — ABNORMAL LOW (ref 3.87–5.11)
RDW: 16.3 % — ABNORMAL HIGH (ref 11.5–15.5)
WBC: 9 10*3/uL (ref 4.0–10.5)
nRBC: 0 % (ref 0.0–0.2)

## 2022-09-21 LAB — TROPONIN I (HIGH SENSITIVITY)
Troponin I (High Sensitivity): 36 ng/L — ABNORMAL HIGH (ref <18)
Troponin I (High Sensitivity): 33 ng/L — ABNORMAL HIGH (ref <18)

## 2022-09-21 LAB — BRAIN NATRIURETIC PEPTIDE: B Natriuretic Peptide: 2564.7 pg/mL — ABNORMAL HIGH (ref 0.0–100.0)

## 2022-09-21 LAB — CBG MONITORING, ED: Glucose-Capillary: 126 mg/dL — ABNORMAL HIGH (ref 70–99)

## 2022-09-21 LAB — I-STAT CHEM 8, ED
BUN: 61 mg/dL — ABNORMAL HIGH (ref 8–23)
Calcium, Ion: 1.17 mmol/L (ref 1.15–1.40)
Chloride: 112 mmol/L — ABNORMAL HIGH (ref 98–111)
Creatinine, Ser: 2.4 mg/dL — ABNORMAL HIGH (ref 0.44–1.00)
Glucose, Bld: 174 mg/dL — ABNORMAL HIGH (ref 70–99)
HCT: 23 % — ABNORMAL LOW (ref 36.0–46.0)
Hemoglobin: 7.8 g/dL — ABNORMAL LOW (ref 12.0–15.0)
Potassium: 5.2 mmol/L — ABNORMAL HIGH (ref 3.5–5.1)
Sodium: 135 mmol/L (ref 135–145)
TCO2: 14 mmol/L — ABNORMAL LOW (ref 22–32)

## 2022-09-21 LAB — RESP PANEL BY RT-PCR (FLU A&B, COVID) ARPGX2
Influenza A by PCR: NEGATIVE
Influenza B by PCR: NEGATIVE
SARS Coronavirus 2 by RT PCR: NEGATIVE

## 2022-09-21 LAB — T4, FREE: Free T4: 1.19 ng/dL — ABNORMAL HIGH (ref 0.61–1.12)

## 2022-09-21 LAB — TSH: TSH: 3.057 u[IU]/mL (ref 0.350–4.500)

## 2022-09-21 LAB — PROTIME-INR
INR: 1.3 — ABNORMAL HIGH (ref 0.8–1.2)
Prothrombin Time: 15.7 seconds — ABNORMAL HIGH (ref 11.4–15.2)

## 2022-09-21 LAB — PROCALCITONIN: Procalcitonin: 0.14 ng/mL

## 2022-09-21 MED ORDER — SODIUM BICARBONATE 8.4 % IV SOLN
100.0000 meq | Freq: Once | INTRAVENOUS | Status: AC
  Administered 2022-09-21: 100 meq via INTRAVENOUS
  Filled 2022-09-21: qty 50

## 2022-09-21 MED ORDER — SODIUM BICARBONATE 8.4 % IV SOLN
INTRAVENOUS | Status: AC
  Filled 2022-09-21: qty 50

## 2022-09-21 MED ORDER — POLYETHYLENE GLYCOL 3350 17 G PO PACK: 17.0000 g | PACK | Freq: Every day | ORAL | Status: DC | PRN

## 2022-09-21 MED ORDER — GLUCAGON HCL RDNA (DIAGNOSTIC) 1 MG IJ SOLR
1.0000 mg/h | INTRAVENOUS | Status: DC
  Filled 2022-09-21 (×2): qty 5

## 2022-09-21 MED ORDER — GLUCAGON HCL RDNA (DIAGNOSTIC) 1 MG IJ SOLR
5.0000 mg | Freq: Once | INTRAVENOUS | Status: AC
  Administered 2022-09-21: 5 mg via INTRAVENOUS
  Filled 2022-09-21: qty 5

## 2022-09-21 MED ORDER — PROCHLORPERAZINE EDISYLATE 10 MG/2ML IJ SOLN
10.0000 mg | Freq: Once | INTRAMUSCULAR | Status: DC
  Filled 2022-09-21: qty 2

## 2022-09-21 MED ORDER — LACTATED RINGERS IV BOLUS
1000.0000 mL | Freq: Once | INTRAVENOUS | Status: AC
  Administered 2022-09-21: 1000 mL via INTRAVENOUS

## 2022-09-21 MED ORDER — RINGERS IV SOLN: INTRAVENOUS | Status: DC

## 2022-09-21 MED ORDER — LACTATED RINGERS IV SOLN: INTRAVENOUS | Status: DC

## 2022-09-21 MED ORDER — DOCUSATE SODIUM 100 MG PO CAPS: 100.0000 mg | ORAL_CAPSULE | Freq: Two times a day (BID) | ORAL | Status: DC | PRN

## 2022-09-21 MED ORDER — EPINEPHRINE HCL 5 MG/250ML IV SOLN IN NS
0.5000 ug/min | INTRAVENOUS | Status: DC
  Administered 2022-09-21: 0.5 ug/min via INTRAVENOUS
  Filled 2022-09-21 (×2): qty 250

## 2022-09-21 MED ORDER — HEPARIN SODIUM (PORCINE) 5000 UNIT/ML IJ SOLN
5000.0000 [IU] | Freq: Three times a day (TID) | INTRAMUSCULAR | Status: DC
  Administered 2022-09-22 – 2022-09-27 (×17): 5000 [IU] via SUBCUTANEOUS
  Filled 2022-09-21 (×17): qty 1

## 2022-09-21 MED ORDER — HYDRALAZINE HCL 20 MG/ML IJ SOLN
10.0000 mg | INTRAMUSCULAR | Status: DC | PRN
  Administered 2022-09-23 – 2022-09-24 (×3): 10 mg via INTRAVENOUS
  Filled 2022-09-21 (×3): qty 1

## 2022-09-21 NOTE — ED Provider Notes (Signed)
[facility] EMERGENCY DEPARTMENT Provider Note   CSN: [id number] Arrival date & time: 09/21/22  1255     History Chief Complaint  Patient presents with   Bradycardia   Weakness    HPI Jasmine Keith is a 71 y.o. female presenting for chief complaint of weakness.  She is a 71 year old female who has been recently admitted for three-vessel CABG.  Her husband notes that she has been confused and poorly responsive over the last few days.  They deny fevers or chills, nausea vomiting, syncope or shortness of breath.  She has not been eating or drinking very much.  EMS arrived to her house found her with a blood glucose of 30, blood pressure of 60 over palp and a heart rate of 40.  They treated her with atropine x2 with some symptomatic improvement.  Blood pressure improved, GCS improved. Husband arrived and states that she was started on a lot of medicines recently and he has been trying to follow the list.  He endorses ongoing altered mental status throughout the past few days as well as poor p.o. intake and profuse diarrhea. Patient's recorded medical, surgical, social, medication list and allergies were reviewed in the Snapshot window as part of the initial history.   Review of Systems   Review of Systems  Constitutional:  Positive for fatigue. Negative for chills and fever.  HENT:  Negative for ear pain and sore throat.   Eyes:  Negative for pain and visual disturbance.  Respiratory:  Negative for cough and shortness of breath.   Cardiovascular:  Negative for chest pain and palpitations.  Gastrointestinal:  Positive for diarrhea. Negative for abdominal pain and vomiting.  Genitourinary:  Negative for dysuria and hematuria.  Musculoskeletal:  Negative for arthralgias and back pain.  Skin:  Negative for color change and rash.  Neurological:  Negative for seizures and syncope.  All other systems reviewed and are negative.   Physical Exam Updated Vital Signs BP  (!) 121/44   Pulse (!) 37   Temp (!) 95.1 F (35.1 C) (Temporal)   Resp 13   Ht '5\' 1"'$  (1.549 m)   Wt 53.5 kg   SpO2 (!) 80%   BMI 22.29 kg/m  Physical Exam Vitals and nursing note reviewed.  Constitutional:      General: She is not in acute distress.    Appearance: She is well-developed.  HENT:     Head: Normocephalic and atraumatic.  Eyes:     Conjunctiva/sclera: Conjunctivae normal.  Cardiovascular:     Rate and Rhythm: Regular rhythm. Bradycardia present.     Heart sounds: No murmur heard. Pulmonary:     Effort: Pulmonary effort is normal. No respiratory distress.     Breath sounds: Normal breath sounds.  Abdominal:     General: There is no distension.     Palpations: Abdomen is soft.     Tenderness: There is no abdominal tenderness. There is no right CVA tenderness or left CVA tenderness.  Musculoskeletal:        General: No swelling or tenderness. Normal range of motion.     Cervical back: Neck supple.  Skin:    General: Skin is warm and dry.  Neurological:     General: No focal deficit present.     Mental Status: She is alert. Mental status is at baseline. She is disoriented.     Cranial Nerves: No cranial nerve deficit.     Sensory: No sensory deficit.  ED Course/ Medical Decision Making/ A&P    Procedures .Critical Care  Performed by: Tretha Sciara, MD Authorized by: Tretha Sciara, MD   Critical care provider statement:    Critical care time (minutes):  30   Critical care was necessary to treat or prevent imminent or life-threatening deterioration of the following conditions:  Circulatory failure and cardiac failure (hypotension requiring titration onto epi)   Critical care was time spent personally by me on the following activities:  Development of treatment plan with patient or surrogate, discussions with consultants, evaluation of patient's response to treatment, examination of patient, ordering and review of laboratory studies, ordering  and review of radiographic studies, ordering and performing treatments and interventions, pulse oximetry, re-evaluation of patient's condition and review of old charts    Medications Ordered in ED Medications  EPINEPHrine (ADRENALIN) 5 mg in NS 250 mL (0.02 mg/mL) premix infusion (8.5 mcg/min Intravenous Infusion Verify 09/21/22 1458)  lactated ringers bolus 1,000 mL (1,000 mLs Intravenous New Bag/Given 09/21/22 1403)    Medical Decision Making:    SATHVIKA OJO is a 71 y.o. female who presented to the ED today with altered mental status detailed above.     Patient's presentation is complicated by their history of multiple comorbid medical conditions.  Patient placed on continuous vitals and telemetry monitoring while in ED which was reviewed periodically.   Complete initial physical exam performed, notably the patient  was hemodynamically stable in no acute distress.  Over the following 2 hours, patient again became hypotensive, confused and was found to be hypothermic.  Was started on epinephrine drip for stabilization with appropriate restoration of normal vital signs      Reviewed and confirmed nursing documentation for past medical history, family history, social history.    Initial Assessment:   With the patient's presentation of altered mental status hypotension, bradycardia, most likely diagnosis is medication overdose including beta-blocker overdose.  Chart review and patient is not on diltiazem or digoxin. Other diagnoses were considered including (but not limited to) hypothermia secondary to hypothyroidism and developing myxedema coma, sepsis, primary cardiogenic dysrhythmia. These are considered less likely due to history of present illness and physical exam findings.   This is most consistent with an acute life/limb threatening illness complicated by underlying chronic conditions.  Initial Plan:  Screening labs including CBC and Metabolic panel to evaluate for infectious or  metabolic etiology of disease.  Urinalysis with reflex culture ordered to evaluate for UTI or relevant urologic/nephrologic pathology.  CXR to evaluate for structural/infectious intrathoracic pathology.  EKG to evaluate for cardiac pathology. Thyroid studies to evaluate for endocrinologic crisis Objective evaluation as below reviewed with plan for close reassessment  Initial Study Results:   Laboratory  Multiple disruptions appreciated including AKI, mild anemia EKG EKG was reviewed independently. Rate, rhythm, axis, intervals all examined and without medically relevant abnormality. ST segments without concerns for elevations.    Radiology  All images reviewed independently. Agree with radiology report at this time.   DG Chest Port 1 View  Result Date: 09/21/2022 CLINICAL DATA:  Chest pain. EXAM: PORTABLE CHEST 1 VIEW COMPARISON:  Chest x-ray September 12, 2022. FINDINGS: Similar small left pleural effusion with overlying left basilar opacities. No visible pneumothorax. Similar mild enlargement the cardiac silhouette. CABG and median sternotomy. IMPRESSION: Similar small left pleural effusion with overlying left basilar opacities. Electronically Signed   By: Margaretha Sheffield M.D.   On: 09/21/2022 13:49     Consults:  Case discussed with critical care,  cardiology.   Final Assessment and Plan:   Ultimately, given the severe nature of patient's presentation and recurrence of her hypotension, patient is going to need ICU level care.  Started on an epinephrine drip which was titrated in the emergency department and patient was admitted for further care and management.  Patient admitted with no further acute events in the emergency department.   Disposition:   Based on the above findings, I believe this patient is stable for admission.    Patient/family educated about specific findings on our evaluation and explained exact reasons for admission.  Patient/family educated about clinical  situation and time was allowed to answer questions.   Admission team communicated with and agreed with need for admission. Patient admitted. Patient ready to move at this time.     Emergency Department Medication Summary:   Medications  EPINEPHrine (ADRENALIN) 5 mg in NS 250 mL (0.02 mg/mL) premix infusion (8.5 mcg/min Intravenous Infusion Verify 09/21/22 1458)  lactated ringers bolus 1,000 mL (1,000 mLs Intravenous New Bag/Given 09/21/22 1403)         Clinical Impression:  1. Bradycardia   2. Shock ([place])      Admit   Final Clinical Impression(s) / ED Diagnoses Final diagnoses:  Bradycardia  Shock ([place])    Rx / DC Orders ED Discharge Orders     None         Tretha Sciara, MD 09/21/22 1514

## 2022-09-21 NOTE — Patient Outreach (Addendum)
Care Coordination [facility] Note Transition Care Management Follow-up Telephone Call Date of discharge and from where: Sunday, 09/17/22 [facility]; CAD; CABG x 3 How have you been since you were released from the hospital? Per husband/ caregiver Thayer Jew, on [facility] DPR: "She is having a really bad morning... her sugar dropped down to 30 and she was really shaky and sweaty-- I gave her some OJ and some food and I got it back up to 110 and she says she is feeling better now.  She is still dizzy, but she is always dizzy, so that is nothing new-- I have started giving her the antivert, like they told me to, I don't know if it is helping yet or not.  She is not eating very good since she has been home, and is still having some diarrhea, like she was in the hospital.  Her blood pressures are looking better.  The people at the hospital told me that they would likely arrange for a home health nurse to come in to the home, but nothing was ever said again about that-- and I have not heard from anyone... I will ask Dr. Kipp Brood about it tomorrow when we have our phone appointment and I will call her PCP if Dr. Kipp Brood tells me to-- but as weak as she is, I don't know that I will be able to get her in the car to take her to any doctor." Any questions or concerns? Yes- low fasting/ morning blood sugars post-hospital discharge; no home health services ordered at time of hospital discharge  Items Reviewed: Did the pt receive and understand the discharge instructions provided? Yes  Medications obtained and verified? Yes  Other? No  Any new allergies since your discharge? No  Dietary orders reviewed? Yes Do you have support at home? Yes  Husband assisting with all ADL's and iADL's post- surgery  Home Care and Equipment/Supplies: Were home health services ordered? Husband reports he was told "early on" during hospitalization that home health would be ordered; stated "after they told me that, I never heard another word about  it" If so, what is the name of the agency? N/A  Has the agency set up a time to come to the patient's home? no Were any new equipment or medical supplies ordered?  No What is the name of the medical supply agency? N/A Were you able to get the supplies/equipment? not applicable Do you have any questions related to the use of the equipment or supplies? No N/A  Functional Questionnaire: (I = Independent and D = Dependent) ADLs: D  Husband assisting with all ADL's and iADL's post- surgery  Bathing/Dressing- D  Husband assisting with all ADL's and iADL's post- surgery  Meal Prep- D  Husband assisting with all ADL's and iADL's post- surgery  Eating- I  Maintaining continence- D  Husband assisting with all ADL's and iADL's post- surgery  Transferring/Ambulation- D  Husband assisting with all ADL's and iADL's post- surgery: confirms patient using walker post-hospital discharge  Managing Meds- D  Husband assisting with all ADL's and iADL's post- surgery  Follow up appointments reviewed:  PCP Hospital f/u appt confirmed? No  Scheduled to see - on - @ [facility] f/u appt confirmed? Yes  Scheduled to see cardiothoracic surgeon, Dr. Kipp Brood on Friday, 09/22/22 @ 3:00 pm- telephone appointment; Monday 10/02/22- cardiology Are transportation arrangements needed? No  If their condition worsens, is the pt aware to call PCP or go to the Emergency Dept.? Yes Was the  patient provided with contact information for the PCP's office or ED? Yes Was to pt encouraged to call back with questions or concerns? Yes  SDOH assessments and interventions completed:   Yes  Care Coordination Interventions Activated:  Yes   Care Coordination Interventions:  Provided extensive education around hypoglycemia and corresponding action plan; basic use of insulin and need to eat when taking insulin; process for initiation of home health services; process to schedule virtual or telephone appointment with PCP;  care coordination outreach to PCP and RN CM Care Coordinator as FYI     Encounter Outcome:  Pt. Visit Completed    Oneta Rack, RN, BSN, CCRN Alumnus RN CM Care Coordination/ Transition of [place] Management [phone]: direct office

## 2022-09-21 NOTE — Progress Notes (Signed)
Spoke with pharmacy about IV compatibility for Glucagen (human recombinant glucagon). Pharmacist stressed that this should only be used with D5 in water, is not compatible with NS and should not be combined with other drugs. Will hang with D5 and label tubing "do not flush with saline".  Orbie Pyo, RN

## 2022-09-21 NOTE — ED Notes (Signed)
Critical Care Md at beside.

## 2022-09-21 NOTE — Progress Notes (Signed)
[facility] Progress Note Patient Name: TRYPHENA PERKOVICH DOB: [birth year] MRN: [id number]   Date of Service  09/21/2022  HPI/Events of Note  Multiple issues: 1. Nursing wants to know if patient needs Glucagon IV infusion as HR = 72 post Glucagon bolus. HR now = 66. 2. Hypertension - BP = 159/51.  eICU Interventions  Plan: Start Glucagon IV infusion as ordered. Hydralazine 10 mg IV Q 4 hours PRN SBP > 160 or DBP > 100. Compazine 10 mg IV X 1.     Intervention Category Major Interventions: Hypertension - evaluation and management;Arrhythmia - evaluation and management;Other:  Lysle Dingwall 09/21/2022, 10:19 PM
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
## 2022-09-21 NOTE — Telephone Encounter (Signed)
Patient's husband, Thayer Jew contacted the office concerned that "things are really bad here". Patient is s/p CABG with Dr. Kipp Brood 10/30 and was discharged 11/12. He states that she is dizzy and has a lot of diarrhea- both which he states are not new since discharge from the hospital. He states that her blood pressure was normal at 115/354 and temperature 96 degrees. States that her BG was 30 this morning but went from 74-110 after orange juice and half a bagel. He states that she is sweaty, cold, and very tired. "She just wants to sleep all the time", "she does not want to eat". She is drinking some, but per husband "not enough". He states that she cannot walk and he is having to carry her to the restroom every time she has a BM. Advised per symptoms patient should go to the nearest ED for further evaluation. He states that he does not want her going to the hospital where they live in [place]. Advised that if he calls 911, depending on her status when they arrived, she may be transferred to [facility]. He acknowledged receipt and states he will keep the office update.

## 2022-09-21 NOTE — ED Triage Notes (Signed)
Pt arrives via [facility] from home with weakness, SOB and HR in 30s. EMS gave total of 2 mg atropine (1242 and 1246) and 20 mg lidocaine via IO.

## 2022-09-22 ENCOUNTER — Encounter: Payer: Self-pay | Admitting: Thoracic Surgery (Cardiothoracic Vascular Surgery)

## 2022-09-22 ENCOUNTER — Inpatient Hospital Stay (HOSPITAL_COMMUNITY): Payer: Medicare Other

## 2022-09-22 DIAGNOSIS — R001 Bradycardia, unspecified: Secondary | ICD-10-CM | POA: Diagnosis not present

## 2022-09-22 DIAGNOSIS — T447X5A Adverse effect of beta-adrenoreceptor antagonists, initial encounter: Secondary | ICD-10-CM

## 2022-09-22 DIAGNOSIS — R9431 Abnormal electrocardiogram [ECG] [EKG]: Secondary | ICD-10-CM

## 2022-09-22 HISTORY — DX: Adverse effect of beta-adrenoreceptor antagonists, initial encounter: T44.7X5A

## 2022-09-22 LAB — ECHOCARDIOGRAM LIMITED
Area-P 1/2: 3.2 cm2
Height: 61 in
S' Lateral: 2.5 cm
Weight: 1985.9 oz

## 2022-09-22 LAB — HEMOGLOBIN AND HEMATOCRIT, BLOOD
HCT: 23.5 % — ABNORMAL LOW (ref 36.0–46.0)
Hemoglobin: 7.5 g/dL — ABNORMAL LOW (ref 12.0–15.0)

## 2022-09-22 LAB — CBC
HCT: 20.4 % — ABNORMAL LOW (ref 36.0–46.0)
Hemoglobin: 6.6 g/dL — CL (ref 12.0–15.0)
MCH: 26.6 pg (ref 26.0–34.0)
MCHC: 32.4 g/dL (ref 30.0–36.0)
MCV: 82.3 fL (ref 80.0–100.0)
Platelets: 450 10*3/uL — ABNORMAL HIGH (ref 150–400)
RBC: 2.48 MIL/uL — ABNORMAL LOW (ref 3.87–5.11)
RDW: 16.5 % — ABNORMAL HIGH (ref 11.5–15.5)
WBC: 10.9 10*3/uL — ABNORMAL HIGH (ref 4.0–10.5)
nRBC: 0 % (ref 0.0–0.2)

## 2022-09-22 LAB — BASIC METABOLIC PANEL
Anion gap: 13 (ref 5–15)
BUN: 64 mg/dL — ABNORMAL HIGH (ref 8–23)
CO2: 17 mmol/L — ABNORMAL LOW (ref 22–32)
Calcium: 8.4 mg/dL — ABNORMAL LOW (ref 8.9–10.3)
Chloride: 110 mmol/L (ref 98–111)
Creatinine, Ser: 2.09 mg/dL — ABNORMAL HIGH (ref 0.44–1.00)
GFR, Estimated: 25 mL/min — ABNORMAL LOW (ref >60)
Glucose, Bld: 209 mg/dL — ABNORMAL HIGH (ref 70–99)
Potassium: 4.6 mmol/L (ref 3.5–5.1)
Sodium: 140 mmol/L (ref 135–145)

## 2022-09-22 LAB — PHOSPHORUS: Phosphorus: 4.4 mg/dL (ref 2.5–4.6)

## 2022-09-22 LAB — GLUCOSE, CAPILLARY
Glucose-Capillary: 249 mg/dL — ABNORMAL HIGH (ref 70–99)
Glucose-Capillary: 196 mg/dL — ABNORMAL HIGH (ref 70–99)

## 2022-09-22 LAB — MAGNESIUM: Magnesium: 1.3 mg/dL — ABNORMAL LOW (ref 1.7–2.4)

## 2022-09-22 MED ORDER — CHLORHEXIDINE GLUCONATE CLOTH 2 % EX PADS
6.0000 | MEDICATED_PAD | Freq: Every day | CUTANEOUS | Status: DC
  Administered 2022-09-22 – 2022-09-24 (×3): 6 via TOPICAL

## 2022-09-22 MED ORDER — LEVOTHYROXINE SODIUM 25 MCG PO TABS
25.0000 ug | ORAL_TABLET | Freq: Every day | ORAL | Status: DC
  Administered 2022-09-23 – 2022-09-27 (×5): 25 ug via ORAL
  Filled 2022-09-22 (×5): qty 1

## 2022-09-22 MED ORDER — ORAL CARE MOUTH RINSE: 15.0000 mL | OROMUCOSAL | Status: DC | PRN

## 2022-09-22 MED ORDER — OXYCODONE HCL 5 MG PO TABS
5.0000 mg | ORAL_TABLET | Freq: Four times a day (QID) | ORAL | Status: DC | PRN
  Administered 2022-09-22 – 2022-09-26 (×3): 5 mg via ORAL
  Filled 2022-09-22 (×4): qty 1

## 2022-09-22 MED ORDER — INSULIN ASPART 100 UNIT/ML IJ SOLN
0.0000 [IU] | Freq: Three times a day (TID) | INTRAMUSCULAR | Status: DC
  Administered 2022-09-22: 3 [IU] via SUBCUTANEOUS
  Administered 2022-09-23 (×2): 1 [IU] via SUBCUTANEOUS
  Administered 2022-09-23 – 2022-09-24 (×4): 2 [IU] via SUBCUTANEOUS
  Administered 2022-09-25: 3 [IU] via SUBCUTANEOUS
  Administered 2022-09-25: 2 [IU] via SUBCUTANEOUS
  Administered 2022-09-26: 5 [IU] via SUBCUTANEOUS
  Administered 2022-09-26: 3 [IU] via SUBCUTANEOUS
  Administered 2022-09-26: 2 [IU] via SUBCUTANEOUS
  Administered 2022-09-27: 3 [IU] via SUBCUTANEOUS
  Administered 2022-09-27: 5 [IU] via SUBCUTANEOUS
  Administered 2022-09-27: 1 [IU] via SUBCUTANEOUS

## 2022-09-22 MED ORDER — MAGNESIUM SULFATE 4 GM/100ML IV SOLN
4.0000 g | Freq: Once | INTRAVENOUS | Status: AC
  Administered 2022-09-22: 4 g via INTRAVENOUS
  Filled 2022-09-22: qty 100

## 2022-09-22 MED ORDER — INSULIN ASPART 100 UNIT/ML IJ SOLN: 0.0000 [IU] | Freq: Every day | INTRAMUSCULAR | Status: DC
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
## 2022-09-22 NOTE — Progress Notes (Signed)
Pt admitted to 6E AxOx4, VS wnL and as per flow. Pt oriented to 6E processes. Pt familiar with the [facility] system. All questions and concerns addressed. Call bell placed within reach, will continue to monitor and maintain safety.  ?

## 2022-09-22 NOTE — Progress Notes (Signed)
  Echocardiogram 2D Echocardiogram has been performed.  Jasmine Keith 09/22/2022, 3:02 PM
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
## 2022-09-22 NOTE — Progress Notes (Addendum)
[place] Progress Note Patient Name: Jasmine Keith DOB: 01/26/1951 MRN: [id number]   Date of Service  09/22/2022  HPI/Events of Note  Nursing request for pain medication to pull IO Catheter. Awake and alert. Now able to swallow without difficulty. Patient is on Oxycodone IR at home.  eICU Interventions  Plan: NPO except sips with meds and ice chips. Oxycodone IR 5 mg PO Q 6 hours PRN severe pain.      Intervention Category Major Interventions: Other:  Lysle Dingwall 09/22/2022, 4:44 AM

## 2022-09-23 DIAGNOSIS — T447X5A Adverse effect of beta-adrenoreceptor antagonists, initial encounter: Secondary | ICD-10-CM | POA: Diagnosis not present

## 2022-09-23 DIAGNOSIS — Z951 Presence of aortocoronary bypass graft: Secondary | ICD-10-CM

## 2022-09-23 DIAGNOSIS — R001 Bradycardia, unspecified: Secondary | ICD-10-CM | POA: Diagnosis not present

## 2022-09-23 LAB — COMPREHENSIVE METABOLIC PANEL
ALT: 14 U/L (ref 0–44)
AST: 11 U/L — ABNORMAL LOW (ref 15–41)
Albumin: 2.3 g/dL — ABNORMAL LOW (ref 3.5–5.0)
Alkaline Phosphatase: 72 U/L (ref 38–126)
Anion gap: 11 (ref 5–15)
BUN: 54 mg/dL — ABNORMAL HIGH (ref 8–23)
CO2: 18 mmol/L — ABNORMAL LOW (ref 22–32)
Calcium: 8.7 mg/dL — ABNORMAL LOW (ref 8.9–10.3)
Chloride: 111 mmol/L (ref 98–111)
Creatinine, Ser: 1.65 mg/dL — ABNORMAL HIGH (ref 0.44–1.00)
GFR, Estimated: 33 mL/min — ABNORMAL LOW (ref >60)
Glucose, Bld: 157 mg/dL — ABNORMAL HIGH (ref 70–99)
Potassium: 4.6 mmol/L (ref 3.5–5.1)
Sodium: 140 mmol/L (ref 135–145)
Total Bilirubin: 0.2 mg/dL — ABNORMAL LOW (ref 0.3–1.2)
Total Protein: 5.5 g/dL — ABNORMAL LOW (ref 6.5–8.1)

## 2022-09-23 LAB — MAGNESIUM: Magnesium: 2.4 mg/dL (ref 1.7–2.4)

## 2022-09-23 LAB — CBC
HCT: 21.7 % — ABNORMAL LOW (ref 36.0–46.0)
HCT: 29.3 % — ABNORMAL LOW (ref 36.0–46.0)
Hemoglobin: 7 g/dL — ABNORMAL LOW (ref 12.0–15.0)
Hemoglobin: 9.4 g/dL — ABNORMAL LOW (ref 12.0–15.0)
MCH: 26.8 pg (ref 26.0–34.0)
MCH: 26.3 pg (ref 26.0–34.0)
MCHC: 32.3 g/dL (ref 30.0–36.0)
MCHC: 32.1 g/dL (ref 30.0–36.0)
MCV: 83.1 fL (ref 80.0–100.0)
MCV: 82.1 fL (ref 80.0–100.0)
Platelets: 541 10*3/uL — ABNORMAL HIGH (ref 150–400)
Platelets: 555 10*3/uL — ABNORMAL HIGH (ref 150–400)
RBC: 2.61 MIL/uL — ABNORMAL LOW (ref 3.87–5.11)
RBC: 3.57 MIL/uL — ABNORMAL LOW (ref 3.87–5.11)
RDW: 16.5 % — ABNORMAL HIGH (ref 11.5–15.5)
RDW: 15.8 % — ABNORMAL HIGH (ref 11.5–15.5)
WBC: 8.4 10*3/uL (ref 4.0–10.5)
WBC: 8.7 10*3/uL (ref 4.0–10.5)
nRBC: 0 % (ref 0.0–0.2)
nRBC: 0 % (ref 0.0–0.2)

## 2022-09-23 LAB — GLUCOSE, CAPILLARY
Glucose-Capillary: 135 mg/dL — ABNORMAL HIGH (ref 70–99)
Glucose-Capillary: 147 mg/dL — ABNORMAL HIGH (ref 70–99)
Glucose-Capillary: 177 mg/dL — ABNORMAL HIGH (ref 70–99)
Glucose-Capillary: 149 mg/dL — ABNORMAL HIGH (ref 70–99)

## 2022-09-23 LAB — TROPONIN I (HIGH SENSITIVITY): Troponin I (High Sensitivity): 34 ng/L — ABNORMAL HIGH (ref <18)

## 2022-09-23 LAB — PREPARE RBC (CROSSMATCH)

## 2022-09-23 MED ORDER — AMLODIPINE BESYLATE 10 MG PO TABS
10.0000 mg | ORAL_TABLET | Freq: Every day | ORAL | Status: DC
  Administered 2022-09-23 – 2022-09-27 (×5): 10 mg via ORAL
  Filled 2022-09-23 (×5): qty 1

## 2022-09-23 MED ORDER — HYDRALAZINE HCL 25 MG PO TABS
25.0000 mg | ORAL_TABLET | Freq: Three times a day (TID) | ORAL | Status: DC
  Administered 2022-09-23: 25 mg via ORAL
  Filled 2022-09-23: qty 1

## 2022-09-23 MED ORDER — DICYCLOMINE HCL 10 MG PO CAPS
10.0000 mg | ORAL_CAPSULE | Freq: Three times a day (TID) | ORAL | Status: DC
  Administered 2022-09-23 – 2022-09-24 (×2): 10 mg via ORAL
  Filled 2022-09-23 (×2): qty 1

## 2022-09-23 MED ORDER — NITROGLYCERIN 0.4 MG SL SUBL
SUBLINGUAL_TABLET | SUBLINGUAL | Status: AC
  Filled 2022-09-23: qty 1

## 2022-09-23 MED ORDER — HYDRALAZINE HCL 50 MG PO TABS
50.0000 mg | ORAL_TABLET | Freq: Three times a day (TID) | ORAL | Status: DC
  Administered 2022-09-23 – 2022-09-25 (×6): 50 mg via ORAL
  Filled 2022-09-23 (×6): qty 1

## 2022-09-23 MED ORDER — FUROSEMIDE 10 MG/ML IJ SOLN
40.0000 mg | Freq: Once | INTRAMUSCULAR | Status: AC
  Administered 2022-09-23: 40 mg via INTRAVENOUS
  Filled 2022-09-23: qty 4

## 2022-09-23 MED ORDER — SODIUM CHLORIDE 0.9% IV SOLUTION: Freq: Once | INTRAVENOUS | Status: AC

## 2022-09-23 MED ORDER — LABETALOL HCL 5 MG/ML IV SOLN
20.0000 mg | INTRAVENOUS | Status: DC | PRN
  Administered 2022-09-23 – 2022-09-24 (×3): 20 mg via INTRAVENOUS
  Filled 2022-09-23 (×3): qty 4

## 2022-09-23 NOTE — Progress Notes (Signed)
[facility] PROGRESS NOTE  Jasmine Keith (DOB: [birth year]) ZOX:[id number] PCP: Mackie Pai, PA-C  Brief Narrative: Jasmine Keith is a 71 y.o. 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 She had an episode of hypoglycemia at home due to poor oral intake, and had otherwise been taking medications (administered by husband), including clonidine and coreg as prescribed at discharge.   She had been discharged 11/12 after CABG with postoperative course complicated by hypertension improved with medication adjustments. At home she had ongoing diarrhea which has been ongoing since C. diff colitis   Subjective: Pt denies any bleeding, feels like she's having more diarrhea again, also some difficulty breathing for which she requested incentive spirometry. No chest pain this AM, but having tightness across chest associated with elevated BP this afternoon. Amenable to transfusion.   Objective: BP (!) 170/46   Pulse 66   Temp 97.6 F (36.4 C) (Oral)   Resp 18   Ht '5\' 1"'$  (1.549 m)   Wt 56 kg   SpO2 95%   BMI 23.32 kg/m   Gen: Frail female in no distress Pulm: Crackles at bases, nonlabored with SpO2 >94% throughout.  CV: NSR with rate in 60's on monitor, regular, no murmurs. Healing sternotomy wound.  GI: Soft, NT, ND, +BS  Neuro: Alert and oriented. No new focal deficits. Ext: Warm, no deformities Skin: No other rashes, lesions or ulcers on visualized skin   Assessment & Plan: Principal Problem:   Bradycardia Active Problems:   Adverse reaction to beta-blocker  CAD s/p CABG, HTN:  - Complained of chest tightness this afternoon. Echo showed no pericardial effusion, ECG repeated shows no acutely ischemic features, troponin flat. Cardiology consult  appreciated.  - Increase hydralazine, continue norvasc. Caution titrating back upward with presentation for hypotension.  Sinus bradycardia due to beta blocker: Improved s/p glucagon, off pressors. - Hold beta blocker and clonidine  COPD:  - Incentive spirometry at bedside  Diarrhea: No abdominal pain/tenderness, no leukocytosis. Continue to replete GI losses and monitor.  - GI note by Dr. Lyndel Safe reviewed 07/18/2022 recommend bentyl AC/HS prn, which we will order. Also notes to avoid magnesium supplementation if recurrent diarrhea (was resolved at that time).  - Caution with PPI in setting of previous C. diff.   T2DM: HbA1c 6.9% recently.  - Avoid hypoglycemia, stop home levemir 15u  - Continue SSI, at inpatient goal  Symptomatic anemia of critical illness: No bleeding noted, hgb trending slowly downward.  - With hgb squarely < 8 and recent CABG and ongoing dyspnea, will give 1u PRBCs. D/w pt and spouse r/b/a. Give IV lasix today with crackles at bases, s/p IVF resuscitation and volume to be given with transfusion.  - FOBT. 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 - Continue heparin 5,000u q8h VTE ppx.  AKI on stage IIIa CKD: Improving - Holding entresto, cardiology agrees. - Monitor daily.  Hypothyroidism: TSH wnl at 3.057, free T4 checked on admission to ICU was 1.19 (ULN 1.12) - Recheck at follow up - Continue low dose synthroid  Lactic acidosis: Improved.   Mixed shock due to hypovolemia and medication-induced bradycardia: Resolved.  Hyperkalemia: Resolved  Patrecia Pour, MD [facility] [email or web address] 09/23/2022, 4:37 PM

## 2022-09-23 NOTE — Consult Note (Signed)
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           PSV cm/sEDV cmsDescribe        Arm Pressure +----------+--------+-------+----------------+------------+ Subclavian213            Multiphasic, WNL             +----------+--------+-------+----------------+------------+ +---------+--------+--+--------+--+---------+ VertebralPSV cm/s53EDV cm/s17Antegrade +---------+--------+--+--------+--+---------+ Left Carotid Findings: +----------+--------+--------+--------+------------+---------------------------+           PSV cm/sEDV cm/sStenosisDescribe    Comments                    +----------+--------+--------+--------+------------+---------------------------+ CCA Prox  107     22                                                      +----------+--------+--------+--------+------------+---------------------------+ CCA Mid                           heterogenous                             +----------+--------+--------+--------+------------+---------------------------+ CCA  Distal94      24                          intimal thickening          +----------+--------+--------+--------+------------+---------------------------+ ICA Prox  135     37      1-39%   heterogenousVelocities suggest upper                                                  end of scale                +----------+--------+--------+--------+------------+---------------------------+ ICA Mid   132     36                                                      +----------+--------+--------+--------+------------+---------------------------+ ICA Distal111     34                                                      +----------+--------+--------+--------+------------+---------------------------+ ECA       185     19                                                      +----------+--------+--------+--------+------------+---------------------------+ +----------+--------+--------+----------------+------------+ SubclavianPSV cm/sEDV cm/sDescribe        Arm Pressure +----------+--------+--------+----------------+------------+           189             Multiphasic, WNL             +----------+--------+--------+----------------+------------+ +---------+--------+---+--------+--+---------+ VertebralPSV cm/s107EDV cm/s13Antegrade +---------+--------+---+--------+--+---------+  ABI Findings: +---------+------------------+-----+--------+--------+ Right    Rt Pressure (mmHg)IndexWaveformComment  +---------+------------------+-----+--------+--------+ Brachial 163                                     +---------+------------------+-----+--------+--------+ PTA      86                0.51 biphasic         +---------+------------------+-----+--------+--------+ DP       77                0.46 biphasic         +---------+------------------+-----+--------+--------+ Great Toe45                 0.27                  +---------+------------------+-----+--------+--------+ +---------+------------------+-----+--------+-------+ Left     Lt Pressure (mmHg)IndexWaveformComment +---------+------------------+-----+--------+-------+ Brachial 169                                    +---------+------------------+-----+--------+-------+ PTA      118  0.70 biphasic        +---------+------------------+-----+--------+-------+ DP       112               0.66 biphasic        +---------+------------------+-----+--------+-------+ Great Toe78                0.46                 +---------+------------------+-----+--------+-------+ +-------+---------------+----------------+ ABI/TBIToday's ABI/TBIPrevious ABI/TBI +-------+---------------+----------------+ Right  0.51                            +-------+---------------+----------------+ Left   0.70                            +-------+---------------+----------------+  Right Doppler Findings: +-----------+--------+-----+---------+--------+ Site       PressureIndexDoppler  Comments +-----------+--------+-----+---------+--------+ Brachial   163                            +-----------+--------+-----+---------+--------+ Radial                  triphasic         +-----------+--------+-----+---------+--------+ Ulnar                   triphasic         +-----------+--------+-----+---------+--------+ Palmar Arch                      WNL      +-----------+--------+-----+---------+--------+  Left Doppler Findings: +-----------+--------+-----+---------+-------------+ Site       PressureIndexDoppler  Comments      +-----------+--------+-----+---------+-------------+ Brachial   169                                 +-----------+--------+-----+---------+-------------+ Radial                  triphasic              +-----------+--------+-----+---------+-------------+ Ulnar                    triphasic              +-----------+--------+-----+---------+-------------+ Palmar Arch                      left abnormal +-----------+--------+-----+---------+-------------+   Summary: Right Carotid: Velocities in the right ICA are consistent with a 1-39% stenosis. Left Carotid: Velocities in the left ICA are consistent with a 1-39% stenosis. Vertebrals:  Bilateral vertebral arteries demonstrate antegrade flow. Subclavians: Normal flow hemodynamics were seen in bilateral subclavian              arteries. Right ABI: Resting right ankle-brachial index indicates moderate right lower extremity arterial disease. The right toe-brachial index is abnormal. Left ABI: Resting left ankle-brachial index indicates moderate left lower extremity arterial disease. The left toe-brachial index is abnormal. Right Upper Extremity: Doppler waveforms remain within normal limits with right radial compression. Doppler waveforms remain within normal limits with right ulnar compression. Left Upper Extremity: Doppler waveforms remain within normal limits with left radial compression. Doppler waveforms decrease >50% with left ulnar compression.  Electronically signed by Jamelle Haring on 09/02/2022 at 10:38:48 AM.    Final     EKG: SR rate 69 diffuse T wave  changes    ASSESSMENT AND PLAN:   Bradycardia:  resolved Secondary to elevated K, azotemia and being on high dose coreg and clonidine No AV block no indication for PPM Anemia:  post op getting transfusion no signs of bleeding check Fecal occult blood CABG:  troponin negative wounds healing EF normal Repeat TTE to make sure no effusion CXR ok  COPD:  sats ok Use IS atelectasis on CXR  Delerium:  improving no clonidine had some post operatively as well  HTN:  She is dry no lasix. Avoid coreg/clonidine with bradycardia On lower dose norvasc and hydralazine Permissive HTN given readmission for over medication and azotemia. D/c entresto  Thyroid:  on synthroid  replacement TSH normal 3 Diarrhea:  history of C dif Recheck per primary service   Signed: Jenkins Rouge 09/23/2022, 3:35 PM

## 2022-09-24 ENCOUNTER — Inpatient Hospital Stay (HOSPITAL_COMMUNITY): Payer: Medicare Other

## 2022-09-24 DIAGNOSIS — R001 Bradycardia, unspecified: Secondary | ICD-10-CM | POA: Diagnosis not present

## 2022-09-24 DIAGNOSIS — T447X5A Adverse effect of beta-adrenoreceptor antagonists, initial encounter: Secondary | ICD-10-CM | POA: Diagnosis not present

## 2022-09-24 LAB — TYPE AND SCREEN
ABO/RH(D): O POS
Antibody Screen: NEGATIVE
Unit division: 0

## 2022-09-24 LAB — COMPREHENSIVE METABOLIC PANEL
ALT: 15 U/L (ref 0–44)
AST: 19 U/L (ref 15–41)
Albumin: 2.4 g/dL — ABNORMAL LOW (ref 3.5–5.0)
Alkaline Phosphatase: 86 U/L (ref 38–126)
Anion gap: 15 (ref 5–15)
BUN: 32 mg/dL — ABNORMAL HIGH (ref 8–23)
CO2: 21 mmol/L — ABNORMAL LOW (ref 22–32)
Calcium: 9.1 mg/dL (ref 8.9–10.3)
Chloride: 103 mmol/L (ref 98–111)
Creatinine, Ser: 1.24 mg/dL — ABNORMAL HIGH (ref 0.44–1.00)
GFR, Estimated: 47 mL/min — ABNORMAL LOW (ref >60)
Glucose, Bld: 173 mg/dL — ABNORMAL HIGH (ref 70–99)
Potassium: 3.9 mmol/L (ref 3.5–5.1)
Sodium: 139 mmol/L (ref 135–145)
Total Bilirubin: 0.8 mg/dL (ref 0.3–1.2)
Total Protein: 6.1 g/dL — ABNORMAL LOW (ref 6.5–8.1)

## 2022-09-24 LAB — BPAM RBC
Blood Product Expiration Date: 202311202359
ISSUE DATE / TIME: 202311181200
Unit Type and Rh: 9500

## 2022-09-24 LAB — GLUCOSE, CAPILLARY
Glucose-Capillary: 164 mg/dL — ABNORMAL HIGH (ref 70–99)
Glucose-Capillary: 159 mg/dL — ABNORMAL HIGH (ref 70–99)
Glucose-Capillary: 155 mg/dL — ABNORMAL HIGH (ref 70–99)

## 2022-09-24 LAB — CBC
HCT: 33.7 % — ABNORMAL LOW (ref 36.0–46.0)
Hemoglobin: 10.9 g/dL — ABNORMAL LOW (ref 12.0–15.0)
MCH: 26.1 pg (ref 26.0–34.0)
MCHC: 32.3 g/dL (ref 30.0–36.0)
MCV: 80.8 fL (ref 80.0–100.0)
Platelets: 599 10*3/uL — ABNORMAL HIGH (ref 150–400)
RBC: 4.17 MIL/uL (ref 3.87–5.11)
RDW: 15.5 % (ref 11.5–15.5)
WBC: 8.1 10*3/uL (ref 4.0–10.5)
nRBC: 0 % (ref 0.0–0.2)

## 2022-09-24 MED ORDER — ENSURE ENLIVE PO LIQD
237.0000 mL | Freq: Three times a day (TID) | ORAL | Status: DC
  Administered 2022-09-24 – 2022-09-27 (×5): 237 mL via ORAL

## 2022-09-24 MED ORDER — FENOFIBRATE 160 MG PO TABS
160.0000 mg | ORAL_TABLET | Freq: Every day | ORAL | Status: DC
  Administered 2022-09-24 – 2022-09-27 (×4): 160 mg via ORAL
  Filled 2022-09-24 (×4): qty 1

## 2022-09-24 MED ORDER — VENLAFAXINE HCL ER 75 MG PO CP24
75.0000 mg | ORAL_CAPSULE | Freq: Every day | ORAL | Status: DC
  Administered 2022-09-24 – 2022-09-27 (×4): 75 mg via ORAL
  Filled 2022-09-24 (×6): qty 1

## 2022-09-24 MED ORDER — BUSPIRONE HCL 5 MG PO TABS
7.5000 mg | ORAL_TABLET | Freq: Two times a day (BID) | ORAL | Status: DC
  Administered 2022-09-24 – 2022-09-27 (×7): 7.5 mg via ORAL
  Filled 2022-09-24 (×7): qty 2

## 2022-09-24 MED ORDER — DICYCLOMINE HCL 10 MG PO CAPS: 10.0000 mg | ORAL_CAPSULE | Freq: Three times a day (TID) | ORAL | Status: DC | PRN

## 2022-09-24 MED ORDER — ONDANSETRON 4 MG PO TBDP
4.0000 mg | ORAL_TABLET | Freq: Three times a day (TID) | ORAL | Status: DC | PRN
  Administered 2022-09-24 (×2): 4 mg via ORAL
  Filled 2022-09-24 (×3): qty 1

## 2022-09-24 MED ORDER — TRAZODONE HCL 50 MG PO TABS: 50.0000 mg | ORAL_TABLET | Freq: Every evening | ORAL | Status: DC | PRN

## 2022-09-24 NOTE — Plan of Care (Signed)
  Problem: Education: Goal: Will demonstrate proper wound care and an understanding of methods to prevent future damage Outcome: Progressing Goal: Knowledge of disease or condition will improve Outcome: Progressing Goal: Knowledge of the prescribed therapeutic regimen will improve Outcome: Progressing Goal: Individualized Educational Video(s) Outcome: Progressing   Problem: Activity: Goal: Risk for activity intolerance will decrease Outcome: Progressing   Problem: Cardiac: Goal: Will achieve and/or maintain hemodynamic stability Outcome: Progressing   Problem: Clinical Measurements: Goal: Postoperative complications will be avoided or minimized Outcome: Progressing   Problem: Respiratory: Goal: Respiratory status will improve Outcome: Progressing   Problem: Skin Integrity: Goal: Wound healing without signs and symptoms of infection Outcome: Progressing Goal: Risk for impaired skin integrity will decrease Outcome: Progressing   Problem: Urinary Elimination: Goal: Ability to achieve and maintain adequate renal perfusion and functioning will improve Outcome: Progressing   Problem: Education: Goal: Knowledge of General Education information will improve Description: Including pain rating scale, medication(s)/side effects and non-pharmacologic comfort measures Outcome: Progressing   Problem: Health Behavior/Discharge Planning: Goal: Ability to manage health-related needs will improve Outcome: Progressing   Problem: Clinical Measurements: Goal: Ability to maintain clinical measurements within normal limits will improve Outcome: Progressing Goal: Will remain free from infection Outcome: Progressing Goal: Diagnostic test results will improve Outcome: Progressing Goal: Respiratory complications will improve Outcome: Progressing Goal: Cardiovascular complication will be avoided Outcome: Progressing   Problem: Activity: Goal: Risk for activity intolerance will  decrease Outcome: Progressing   Problem: Nutrition: Goal: Adequate nutrition will be maintained Outcome: Progressing   Problem: Coping: Goal: Level of anxiety will decrease Outcome: Progressing   Problem: Elimination: Goal: Will not experience complications related to bowel motility Outcome: Progressing Goal: Will not experience complications related to urinary retention Outcome: Progressing   Problem: Pain Managment: Goal: General experience of comfort will improve Outcome: Progressing   Problem: Safety: Goal: Ability to remain free from injury will improve Outcome: Progressing   Problem: Skin Integrity: Goal: Risk for impaired skin integrity will decrease Outcome: Progressing   Problem: Education: Goal: Ability to describe self-care measures that may prevent or decrease complications (Diabetes Survival Skills Education) will improve Outcome: Progressing Goal: Individualized Educational Video(s) Outcome: Progressing   Problem: Coping: Goal: Ability to adjust to condition or change in health will improve Outcome: Progressing   Problem: Fluid Volume: Goal: Ability to maintain a balanced intake and output will improve Outcome: Progressing   Problem: Health Behavior/Discharge Planning: Goal: Ability to identify and utilize available resources and services will improve Outcome: Progressing Goal: Ability to manage health-related needs will improve Outcome: Progressing   Problem: Metabolic: Goal: Ability to maintain appropriate glucose levels will improve Outcome: Progressing   Problem: Nutritional: Goal: Maintenance of adequate nutrition will improve Outcome: Progressing Goal: Progress toward achieving an optimal weight will improve Outcome: Progressing   Problem: Skin Integrity: Goal: Risk for impaired skin integrity will decrease Outcome: Progressing   Problem: Tissue Perfusion: Goal: Adequacy of tissue perfusion will improve Outcome: Progressing

## 2022-09-24 NOTE — Progress Notes (Signed)
Primary Cardiologist:  Ravenkar   Subjective:  Seems miserable BP going up MS still lethargic  Objective:  Vitals:   09/24/22 0257 09/24/22 0428 09/24/22 0629 09/24/22 0749  BP: (!) 174/53 (!) 187/58 (!) 187/64 (!) 184/68  Pulse: 88 81  90  Resp: '18 18  20  '$ Temp: 98.3 F (36.8 C) 98.3 F (36.8 C)  97.9 F (36.6 C)  TempSrc: Oral Oral  Oral  SpO2: 95% 94%  97%  Weight:  53.6 kg    Height:        Intake/Output from previous day:  Intake/Output Summary (Last 24 hours) at 09/24/2022 0805 Last data filed at 09/24/2022 2505 Gross per 24 hour  Intake 377.17 ml  Output 2500 ml  Net -2122.83 ml    Physical Exam: Ill appearing female Lungs clear Sternum healed Abdomen benign  No edema Right SVG harvest site good   Lab Results: Basic Metabolic Panel: Recent Labs    09/22/22 0842 09/23/22 0131  NA 140 140  K 4.6 4.6  CL 110 111  CO2 17* 18*  GLUCOSE 209* 157*  BUN 64* 54*  CREATININE 2.09* 1.65*  CALCIUM 8.4* 8.7*  MG 1.3* 2.4  PHOS 4.4  --    Liver Function Tests: Recent Labs    09/21/22 1315 09/23/22 0131  AST 28 11*  ALT 20 14  ALKPHOS 105 72  BILITOT 0.4 0.2*  PROT 6.3* 5.5*  ALBUMIN 2.5* 2.3*   No results for input(s): "LIPASE", "AMYLASE" in the last 72 hours. CBC: Recent Labs    09/21/22 1315 09/21/22 1329 09/23/22 0131 09/23/22 1955  WBC 9.0   < > 8.4 8.7  NEUTROABS 7.9*  --   --   --   HGB 8.6*   < > 7.0* 9.4*  HCT 28.9*   < > 21.7* 29.3*  MCV 86.8   < > 83.1 82.1  PLT 655*   < > 541* 555*   < > = values in this interval not displayed.    Thyroid Function Tests: Recent Labs    09/21/22 1348  TSH 3.057   Anemia Panel: No results for input(s): "VITAMINB12", "FOLATE", "FERRITIN", "TIBC", "IRON", "RETICCTPCT" in the last 72 hours.  Imaging: ECHOCARDIOGRAM LIMITED  Result Date: 09/22/2022    ECHOCARDIOGRAM LIMITED REPORT   Patient Name:   Jasmine Keith Date of Exam: 09/22/2022 Medical Rec #:  [id number]          Height:        61.0 in Accession #:    [id number]         Weight:       124.1 lb Date of Birth:  05-29-1951         BSA:          1.542 m Patient Age:    14 years           BP:           141/37 mmHg Patient Gender: F                  HR:           52 bpm. Exam Location:  Inpatient Procedure: 2D Echo, Limited Color Doppler, Cardiac Doppler and Limited Echo Indications:    Abnormal ECG R94.31  History:        Patient has prior history of Echocardiogram examinations, most                 recent 09/04/2022. CAD, Prior  CABG, Arrythmias:Bradycardia,                 Signs/Symptoms:Fatigue and Shortness of Breath; Risk                 Factors:Hypertension, Dyslipidemia, Diabetes and Current Smoker.  Sonographer:    Greer Pickerel Referring Phys: [id number] STEPHANIE M REESE  Sonographer Comments: Image acquisition challenging due to COPD and Image acquisition challenging due to respiratory motion. IMPRESSIONS  1. Left ventricular ejection fraction, by estimation, is >75%. The left ventricle has hyperdynamic function. The left ventricle has no regional wall motion abnormalities.  2. Right ventricular systolic function is normal. The right ventricular size is normal. There is normal pulmonary artery systolic pressure. The estimated right ventricular systolic pressure is 66.4 mmHg.  3. The mitral valve is grossly normal. Trivial mitral valve regurgitation. No evidence of mitral stenosis.  4. The inferior vena cava is normal in size with <50% respiratory variability, suggesting right atrial pressure of 8 mmHg. Comparison(s): No significant change from prior study. FINDINGS  Left Ventricle: Left ventricular ejection fraction, by estimation, is >75%. The left ventricle has hyperdynamic function. The left ventricle has no regional wall motion abnormalities. The left ventricular internal cavity size was normal in size. There is no left ventricular hypertrophy. Right Ventricle: The right ventricular size is normal. No increase in right ventricular  wall thickness. Right ventricular systolic function is normal. There is normal pulmonary artery systolic pressure. The tricuspid regurgitant velocity is 2.08 m/s, and  with an assumed right atrial pressure of 8 mmHg, the estimated right ventricular systolic pressure is 40.3 mmHg. Pericardium: There is no evidence of pericardial effusion. Mitral Valve: The mitral valve is grossly normal. Trivial mitral valve regurgitation. No evidence of mitral valve stenosis. Tricuspid Valve: The tricuspid valve is grossly normal. Tricuspid valve regurgitation is trivial. No evidence of tricuspid stenosis. Aorta: The aortic root is normal in size and structure. Venous: The inferior vena cava is normal in size with less than 50% respiratory variability, suggesting right atrial pressure of 8 mmHg. Additional Comments: Spectral Doppler performed. Color Doppler performed.  LEFT VENTRICLE PLAX 2D LVIDd:         3.90 cm LVIDs:         2.50 cm LV PW:         1.20 cm LV IVS:        1.00 cm  LEFT ATRIUM         Index LA diam:    3.60 cm 2.33 cm/m  AORTIC VALVE LVOT Vmax:   143.00 cm/s LVOT Vmean:  89.100 cm/s LVOT VTI:    0.316 m MITRAL VALVE                TRICUSPID VALVE MV Area (PHT): 3.20 cm     TR Peak grad:   17.3 mmHg MV Decel Time: 237 msec     TR Vmax:        208.00 cm/s MV E velocity: 133.00 cm/s MV A velocity: 89.10 cm/s   SHUNTS MV E/A ratio:  1.49         Systemic VTI: 0.32 m Eleonore Chiquito MD Electronically signed by Eleonore Chiquito MD Signature Date/Time: 09/22/2022/4:13:47 PM    Final     Cardiac Studies:  ECG:  Orders placed or performed during the hospital encounter of 09/21/22   EKG 12-Lead   EKG 12-Lead   ED EKG   ED EKG     Telemetry:  NSR 09/24/2022   Echo:  11/17 EF > 75% valves ok   Medications:    amLODipine  10 mg Oral Daily   Chlorhexidine Gluconate Cloth  6 each Topical Daily   feeding supplement  237 mL Oral TID BM   heparin  5,000 Units Subcutaneous Q8H   hydrALAZINE  50 mg Oral TID   insulin  aspart  0-5 Units Subcutaneous QHS   insulin aspart  0-9 Units Subcutaneous TID WC   levothyroxine  25 mcg Oral Q0600      Assessment/Plan:   Anemia:  post op consider transfusion  Bradycardia:  resolved NSR beta blocker held K improved with hydration CABG/ wounds healed no signs of infection no signs of graft failure ECG stable troponin negative max 36 No effusion on TTE and normal EF HTN:  on the rise Careful not to escalate meds too quickley as she was azotemic and hypotensive on admission increase hydralazine to 100 mg tid  Jenkins Rouge 09/24/2022, 8:05 AM

## 2022-09-24 NOTE — Progress Notes (Signed)
[facility] PROGRESS NOTE  Jasmine Keith (DOB: Apr 30, 1951) TFT:[id number] PCP: Mackie Pai, PA-C  Brief Narrative: Jasmine Keith is a 71 y.o. 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 She had an episode of hypoglycemia at home due to poor oral intake, and had otherwise been taking medications (administered by husband), including clonidine and coreg as prescribed at discharge.   She had been discharged 11/12 after CABG with postoperative course complicated by hypertension improved with medication adjustments. At home she had ongoing diarrhea.  In the ED she was found to have shock due to hypovolemia and bradycardia, admitted to ICU ultimately hemodynamically stabilizing and transferring to the progressive care unit 11/18.   Subjective: Didn't sleep well at all last night and is sleeping now during the day. Only report is nausea, no vomiting, not drinking ensure even with max encouragement. No abd pain. Husband states she's been doing this a lot before the bypass, thought it was due to the heart. She did that after CABG as well and at home. She's oriented, just withdrawn and sleeping during the day. Still reporting shortness of breath, feels anxious. No chest pain.   Objective: BP (!) 162/67 (BP Location: Right Arm)   Pulse 83   Temp 97.8 F (36.6 C) (Oral)   Resp 18   Ht '5\' 1"'$  (1.549 m)   Wt 53.6 kg   SpO2 97%   BMI 22.33 kg/m   Gen: Frail, chronically ill-appearing female in no distress Pulm: Clear, nonlabored  CV: RRR, no MRG or edema GI: Soft, NT, ND, +BS Neuro: Drowsy but rousable and appropriate, not cooperative with exam because she's tired. Within that constraint, no new focal deficits are noted, unable to determine presence/absence of  asterixis. Ext: Warm, no deformities Skin: Sternum and saphenous vein harvest sites c/d/i. No jaundice or other rashes, lesions or ulcers on visualized skin   Assessment & Plan: Sinus bradycardia due to beta blocker: Improved s/p glucagon, off pressors. - Hold beta blocker and clonidine  CAD s/p CABG, HTN:  - Echo showed no pericardial effusion, ECG repeated shows no acutely ischemic features, troponin flat, max is 36. Cardiology consult appreciated - Continue HTN management. Increased hydralazine, will increase further today, attempting to avoid precipitous drop, continue norvasc.    COPD:  - Incentive spirometry at bedside  Diarrhea: No abdominal pain/tenderness, no leukocytosis. Continue to replete GI losses and monitor.  - GI note by Dr. Lyndel Safe reviewed 07/18/2022 recommend bentyl AC/HS prn, which we have ordered as TID prn. Also notes to avoid magnesium supplementation if recurrent diarrhea (was resolved at that time).  - Caution with PPI in setting of previous C. diff.   Acute metabolic encephalopathy, anxiety: Initially due to shock, also having reversal of day-night cycles still.  - Restart home effexor qAM, and buspar. Add trazodone qHS and formal delirium precautions - Check ammonia. LFTs wnl.   T2DM: HbA1c 6.9% recently.  - Avoid hypoglycemia, stopped home levemir 15u  - Continue SSI, remains at inpatient goal  Dyspnea: Improved with transfusion, not resolved. Pt agrees anxiety could be playing a role. CXR this AM showing only streaky atelectasis, no infiltrate or airspace or interstitial edema or effusions.  - Incentive spirometry.  - Monitor SpO2.   Symptomatic anemia of critical illness: No bleeding noted,  hgb trending slowly downward.  - s/p 1u PRBC 11/18, hgb up as anticipated.  - FOBT. 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 - Continue heparin 5,000u q8h VTE ppx.  AKI on stage IIIa CKD:  Continues to improve.  - Holding entresto.  - Monitor daily.  Hypothyroidism: TSH wnl at 3.057, free T4 checked on admission to ICU was 1.19 (ULN 1.12) - Recheck at follow up - Continue low dose synthroid  Lactic acidosis: Improved.   Mixed shock due to hypovolemia and medication-induced bradycardia: Resolved.  Hyperkalemia: Resolved  Patrecia Pour, MD [facility] [email or web address] 09/24/2022, 2:25 PM

## 2022-09-25 LAB — COMPREHENSIVE METABOLIC PANEL
ALT: 12 U/L (ref 0–44)
AST: 17 U/L (ref 15–41)
Albumin: 2.2 g/dL — ABNORMAL LOW (ref 3.5–5.0)
Alkaline Phosphatase: 70 U/L (ref 38–126)
Anion gap: 11 (ref 5–15)
BUN: 23 mg/dL (ref 8–23)
CO2: 22 mmol/L (ref 22–32)
Calcium: 8.9 mg/dL (ref 8.9–10.3)
Chloride: 108 mmol/L (ref 98–111)
Creatinine, Ser: 1.03 mg/dL — ABNORMAL HIGH (ref 0.44–1.00)
GFR, Estimated: 59 mL/min — ABNORMAL LOW (ref >60)
Glucose, Bld: 178 mg/dL — ABNORMAL HIGH (ref 70–99)
Potassium: 4 mmol/L (ref 3.5–5.1)
Sodium: 141 mmol/L (ref 135–145)
Total Bilirubin: 0.3 mg/dL (ref 0.3–1.2)
Total Protein: 5.5 g/dL — ABNORMAL LOW (ref 6.5–8.1)

## 2022-09-25 LAB — CBC
HCT: 32.8 % — ABNORMAL LOW (ref 36.0–46.0)
Hemoglobin: 10.9 g/dL — ABNORMAL LOW (ref 12.0–15.0)
MCH: 26.9 pg (ref 26.0–34.0)
MCHC: 33.2 g/dL (ref 30.0–36.0)
MCV: 81 fL (ref 80.0–100.0)
Platelets: 515 10*3/uL — ABNORMAL HIGH (ref 150–400)
RBC: 4.05 MIL/uL (ref 3.87–5.11)
RDW: 15.4 % (ref 11.5–15.5)
WBC: 7.1 10*3/uL (ref 4.0–10.5)
nRBC: 0 % (ref 0.0–0.2)

## 2022-09-25 LAB — GLUCOSE, CAPILLARY
Glucose-Capillary: 166 mg/dL — ABNORMAL HIGH (ref 70–99)
Glucose-Capillary: 177 mg/dL — ABNORMAL HIGH (ref 70–99)
Glucose-Capillary: 208 mg/dL — ABNORMAL HIGH (ref 70–99)
Glucose-Capillary: 119 mg/dL — ABNORMAL HIGH (ref 70–99)
Glucose-Capillary: 184 mg/dL — ABNORMAL HIGH (ref 70–99)

## 2022-09-25 LAB — AMMONIA: Ammonia: 38 umol/L — ABNORMAL HIGH (ref 9–35)

## 2022-09-25 MED ORDER — SACUBITRIL-VALSARTAN 24-26 MG PO TABS
1.0000 | ORAL_TABLET | Freq: Two times a day (BID) | ORAL | Status: DC
  Administered 2022-09-25 (×2): 1 via ORAL
  Filled 2022-09-25 (×3): qty 1

## 2022-09-25 MED ORDER — HYDRALAZINE HCL 50 MG PO TABS
100.0000 mg | ORAL_TABLET | Freq: Three times a day (TID) | ORAL | Status: DC
  Administered 2022-09-25 – 2022-09-27 (×7): 100 mg via ORAL
  Filled 2022-09-25 (×7): qty 2

## 2022-09-25 MED ORDER — MECLIZINE HCL 25 MG PO TABS
25.0000 mg | ORAL_TABLET | Freq: Three times a day (TID) | ORAL | Status: DC | PRN
  Administered 2022-09-25: 25 mg via ORAL
  Filled 2022-09-25: qty 1

## 2022-09-25 MED ORDER — HYDRALAZINE HCL 50 MG PO TABS
50.0000 mg | ORAL_TABLET | Freq: Once | ORAL | Status: AC
  Administered 2022-09-25: 50 mg via ORAL
  Filled 2022-09-25: qty 1

## 2022-09-25 MED ORDER — LIDOCAINE 5 % EX PTCH
1.0000 | MEDICATED_PATCH | CUTANEOUS | Status: DC
  Administered 2022-09-25 – 2022-09-27 (×3): 1 via TRANSDERMAL
  Filled 2022-09-25 (×3): qty 1
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
## 2022-09-25 NOTE — Care Management Important Message (Signed)
Important Message  Patient Details  Name: Jasmine Keith MRN: [id number] Date of Birth: 08-Jun-1951   Medicare Important Message Given:  Yes     Shelda Altes 09/25/2022, 10:32 AM

## 2022-09-25 NOTE — Progress Notes (Addendum)
PROGRESS NOTE    Jasmine Keith  ION:[id number] DOB: 1951-08-22 DOA: 09/21/2022 PCP: Mackie Pai, PA-C  Chief Complaint  Patient presents with   Bradycardia   Weakness    Brief Narrative:  Jasmine Keith is Jasmine Keith 71 y.o. female with Kimber Esterly history of CAD s/p CABG 09/04/2022, T2DM, GERD, stage IIIa CKD, CDiff colitis July 2023 who presented to the ED on 09/21/2022 by EMS with hypotension (60/palpable), bradycardia (30's bpm), and confusion in the setting of poor oral intake since discharge. She had an episode of hypoglycemia at home due to poor oral intake, and had otherwise been taking medications (administered by husband), including clonidine and coreg as prescribed at discharge.    She had been discharged 11/12 after CABG with postoperative course complicated by hypertension improved with medication adjustments. At home she had ongoing diarrhea.   In the ED she was found to have shock due to hypovolemia and bradycardia, admitted to ICU ultimately hemodynamically stabilizing and transferring to the progressive care unit 11/18.   Significant events 11/16 - BIB EMS for AMS, bradycardia, hypotension. Epi gtt. Later given glucagon bolus and gtt.  11/17 off epi  11/18 picked up by [facility]  Assessment & Plan:   Principal Problem:   Bradycardia Active Problems:   Adverse reaction to beta-blocker  Sinus bradycardia due to beta blocker: Improved s/p glucagon, off pressors. - Hold beta blocker and clonidine   CAD s/p CABG, HTN:  - Echo showed no pericardial effusion, ECG repeated shows no acutely ischemic features, troponin flat, max is 36. Cardiology consult appreciated - Continue HTN management. Increased hydralazine per cardiology.  Recommending resumption of low dose entresto. continue norvasc.     COPD:  - Incentive spirometry at bedside   Diarrhea: No abdominal pain/tenderness, no leukocytosis. Continue to replete GI losses and monitor.  - GI note by Dr. Lyndel Safe reviewed  07/18/2022 recommend bentyl AC/HS prn, which we have ordered as TID prn. Also notes to avoid magnesium supplementation if recurrent diarrhea (was resolved at that time).  - Caution with PPI in setting of previous C. diff.    Acute metabolic encephalopathy, anxiety: Initially due to shock, also having reversal of day-night cycles still.  - Restart home effexor qAM, and buspar. Add trazodone qHS. - Check ammonia (slightly elevated, unclear significance, will recheck). LFTs wnl.  - delirium precautions   T2DM: HbA1c 6.9% recently.  - Avoid hypoglycemia, stopped home levemir 15u  - Continue SSI, remains at inpatient goal   Dyspnea: Improved with transfusion, not resolved. Pt agrees anxiety could be playing Samanthajo Payano role. CXR this AM showing only streaky atelectasis, no infiltrate or airspace or interstitial edema or effusions.  - Incentive spirometry.  - Monitor SpO2.    Symptomatic anemia of critical illness: No bleeding noted, hgb trending slowly downward.  - s/p 1u PRBC 11/18, hgb up as anticipated.  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 - FOBT pending - Continue heparin 5,000u q8h VTE ppx.   AKI on stage IIIa CKD: Continues to improve.  - will resume low dose entresto   - Monitor daily.   Hypothyroidism: TSH wnl at 3.057, free T4 checked on admission to ICU was 1.19 (ULN 1.12) - Recheck TFT outpatient  - Continue low dose synthroid   Lactic acidosis: Improved.    Mixed shock due to hypovolemia and medication-induced bradycardia: Resolved.   Hyperkalemia: Resolved  Chronic Vertigo: per therapy discussion with patient, she notes  longstanding hx BPPV, taking meclizine as needed  She needs PT/OT given general deconditioning    DVT prophylaxis: heparin Code Status: full Family Communication: none at bedside Disposition:   Status is: Inpatient Remains inpatient appropriate because: pending further  improvement   Consultants:  Cardiology PCCM  Procedures:  Echo IMPRESSIONS     1. Left ventricular ejection fraction, by estimation, is >75%. The left  ventricle has hyperdynamic function. The left ventricle has no regional  wall motion abnormalities.   2. Right ventricular systolic function is normal. The right ventricular  size is normal. There is normal pulmonary artery systolic pressure. The  estimated right ventricular systolic pressure is 48.5 mmHg.   3. The mitral valve is grossly normal. Trivial mitral valve  regurgitation. No evidence of mitral stenosis.   4. The inferior vena cava is normal in size with <50% respiratory  variability, suggesting right atrial pressure of 8 mmHg.   Comparison(s): No significant change from prior study.   Antimicrobials:  Anti-infectives (From admission, onward)    None       Subjective: C/o back pain  Objective: Vitals:   09/25/22 0500 09/25/22 0608 09/25/22 0754 09/25/22 1157  BP:  (!) 164/65 (!) 166/74 (!) 142/57  Pulse:  77 86 69  Resp:   14 16  Temp:   98 F (36.7 C) 97.8 F (36.6 C)  TempSrc:   Oral Oral  SpO2:  96% 97% 96%  Weight: 50.4 kg     Height:        Intake/Output Summary (Last 24 hours) at 09/25/2022 1320 Last data filed at 09/25/2022 0600 Gross per 24 hour  Intake 240 ml  Output 750 ml  Net -510 ml   Filed Weights   09/23/22 0448 09/24/22 0428 09/25/22 0500  Weight: 56 kg 53.6 kg 50.4 kg    Examination:  General exam: Appears calm and comfortable  Respiratory system: unlabored, CTAB Cardiovascular system: S1 & S2 heard, RRR.  Gastrointestinal system: Abdomen is nondistended, soft and nontender.  Central nervous system: Alert and oriented. No focal neurological deficits. Extremities: no LEE   Data Reviewed: I have personally reviewed following labs and imaging studies  CBC: Recent Labs  Lab 09/21/22 1315 09/21/22 1329 09/22/22 0842 09/22/22 1005 09/23/22 0131 09/23/22 1955  09/24/22 0752 09/25/22 0229  WBC 9.0  --  10.9*  --  8.4 8.7 8.1 7.1  NEUTROABS 7.9*  --   --   --   --   --   --   --   HGB 8.6*   < > 6.6* 7.5* 7.0* 9.4* 10.9* 10.9*  HCT 28.9*   < > 20.4* 23.5* 21.7* 29.3* 33.7* 32.8*  MCV 86.8  --  82.3  --  83.1 82.1 80.8 81.0  PLT 655*  --  450*  --  541* 555* 599* 515*   < > = values in this interval not displayed.    Basic Metabolic Panel: Recent Labs  Lab 09/21/22 1315 09/21/22 1329 09/22/22 0842 09/23/22 0131 09/24/22 0752 09/25/22 0229  NA 136 135 140 140 139 141  K 5.2* 5.2* 4.6 4.6 3.9 4.0  CL 112* 112* 110 111 103 108  CO2 13*  --  17* 18* 21* 22  GLUCOSE 174* 174* 209* 157* 173* 178*  BUN 59* 61* 64* 54* 32* 23  CREATININE 2.25* 2.40* 2.09* 1.65* 1.24* 1.03*  CALCIUM 8.7*  --  8.4* 8.7* 9.1 8.9  MG  --   --  1.3* 2.4  --   --  PHOS  --   --  4.4  --   --   --     GFR: Estimated Creatinine Clearance: 38.9 mL/min (Dominyk Law) (by C-G formula based on SCr of 1.03 mg/dL (H)).  Liver Function Tests: Recent Labs  Lab 09/21/22 1315 09/23/22 0131 09/24/22 0752 09/25/22 0229  AST 28 11* 19 17  ALT '20 14 15 12  '$ ALKPHOS 105 72 86 70  BILITOT 0.4 0.2* 0.8 0.3  PROT 6.3* 5.5* 6.1* 5.5*  ALBUMIN 2.5* 2.3* 2.4* 2.2*    CBG: Recent Labs  Lab 09/24/22 1225 09/24/22 1632 09/24/22 2152 09/25/22 0757 09/25/22 1200  GLUCAP 159* 155* 166* 177* 208*     Recent Results (from the past 240 hour(s))  Resp Panel by RT-PCR (Flu Anslie Spadafora&B, Covid) Anterior Nasal Swab     Status: None   Collection Time: 09/21/22  1:03 PM   Specimen: Anterior Nasal Swab  Result Value Ref Range Status   SARS Coronavirus 2 by RT PCR NEGATIVE NEGATIVE Final    Comment: (NOTE) SARS-CoV-2 target nucleic acids are NOT DETECTED.  The SARS-CoV-2 RNA is generally detectable in upper respiratory specimens during the acute phase of infection. The lowest concentration of SARS-CoV-2 viral copies this assay can detect is 138 copies/mL. Brownie Gockel negative result does not preclude  SARS-Cov-2 infection and should not be used as the sole basis for treatment or other patient management decisions. Etienne Millward negative result may occur with  improper specimen collection/handling, submission of specimen other than nasopharyngeal swab, presence of viral mutation(s) within the areas targeted by this assay, and inadequate number of viral copies(<138 copies/mL). Yesica Kemler negative result must be combined with clinical observations, patient history, and epidemiological information. The expected result is Negative.  Fact Sheet for Patients:  EntrepreneurPulse.com.au  Fact Sheet for Healthcare Providers:  IncredibleEmployment.be  This test is no t yet approved or cleared by the [place] FDA and  has been authorized for detection and/or diagnosis of SARS-CoV-2 by FDA under an Emergency Use Authorization (EUA). This EUA will remain  in effect (meaning this test can be used) for the duration of the COVID-19 declaration under Section 564(b)(1) of the Act, 21 U.S.C.section 360bbb-3(b)(1), unless the authorization is terminated  or revoked sooner.       Influenza Shawana Knoch by PCR NEGATIVE NEGATIVE Final   Influenza B by PCR NEGATIVE NEGATIVE Final    Comment: (NOTE) The Xpert Xpress SARS-CoV-2/FLU/RSV plus assay is intended as an aid in the diagnosis of influenza from Nasopharyngeal swab specimens and should not be used as Kairo Laubacher sole basis for treatment. Nasal washings and aspirates are unacceptable for Xpert Xpress SARS-CoV-2/FLU/RSV testing.  Fact Sheet for Patients: EntrepreneurPulse.com.au  Fact Sheet for Healthcare Providers: IncredibleEmployment.be  This test is not yet approved or cleared by the [place] FDA and has been authorized for detection and/or diagnosis of SARS-CoV-2 by FDA under an Emergency Use Authorization (EUA). This EUA will remain in effect (meaning this test can be used) for the duration of  the COVID-19 declaration under Section 564(b)(1) of the Act, 21 U.S.C. section 360bbb-3(b)(1), unless the authorization is terminated or revoked.  Performed at [facility], [place] [address], [place], [place] [address]   Culture, blood (routine x 2)     Status: None (Preliminary result)   Collection Time: 09/21/22  1:03 PM   Specimen: BLOOD  Result Value Ref Range Status   Specimen Description BLOOD LEFT ANTECUBITAL  Final   Special Requests   Final    BOTTLES  DRAWN AEROBIC AND ANAEROBIC Blood Culture adequate volume   Culture   Final    NO GROWTH 4 DAYS Performed at [facility], [place] [address], [place], [place] [address]    Report Status PENDING  Incomplete  Culture, blood (routine x 2)     Status: None (Preliminary result)   Collection Time: 09/21/22  1:20 PM   Specimen: BLOOD LEFT HAND  Result Value Ref Range Status   Specimen Description BLOOD LEFT HAND  Final   Special Requests   Final    BOTTLES DRAWN AEROBIC AND ANAEROBIC Blood Culture adequate volume   Culture   Final    NO GROWTH 4 DAYS Performed at [facility], [place] [address] [place], [place] [address]    Report Status PENDING  Incomplete         Radiology Studies: DG CHEST PORT 1 VIEW  Result Date: 09/24/2022 CLINICAL DATA:  Status post CABG EXAM: PORTABLE CHEST 1 VIEW COMPARISON:  September 22, 2022 FINDINGS: Sternotomy wires are intact. The heart size is unremarkable. The hila and mediastinum are normal. The right lung is clear. Streaky opacity in the left mid lung may represent atelectasis. No other acute abnormalities. No overt edema. IMPRESSION: Streaky opacity in the left mid lung may represent atelectasis. No other acute abnormalities. Electronically Signed   By: Dorise Bullion III M.D.   On: 09/24/2022 09:19        Scheduled Meds:  amLODipine  10 mg Oral Daily   busPIRone  7.5 mg Oral BID   feeding supplement  237 mL Oral TID BM   fenofibrate  160 mg Oral Daily   heparin   5,000 Units Subcutaneous Q8H   hydrALAZINE  100 mg Oral TID   insulin aspart  0-5 Units Subcutaneous QHS   insulin aspart  0-9 Units Subcutaneous TID WC   levothyroxine  25 mcg Oral Q0600   lidocaine  1 patch Transdermal Q24H   venlafaxine XR  75 mg Oral Q breakfast   Continuous Infusions:   LOS: 4 days    Time spent: over 30 min    Fayrene Helper, MD [facility]   To contact the attending provider between 7A-7P or the covering provider during after hours 7P-7A, please log into the web site [email or web address] and access using universal [place] password for that web site. If you do not have the password, please call the hospital operator.  09/25/2022, 1:20 PM

## 2022-09-25 NOTE — Evaluation (Signed)
Physical Therapy Evaluation Patient Details Name: Jasmine Keith MRN: [id number] DOB: 02-11-1951 Today's Date: 09/25/2022  History of Present Illness  71 y.o. female who presented to the ED on 09/21/2022 by EMS with hypotension (60/palpable), bradycardia (30's bpm), and confusion in the setting of poor oral intake since discharge 11/12 In ED found to have shock due to hypovolemia and bradycardia, admitted to ICU ultimately hemodynamically stabilizing and transferring to the progressive care unit 11/18. 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 Pt flat but eyes open and responds to some questions, defers to husband. Pt limited in safe mobility by dizziness and nausea with positional change in presence of generalized weakness and decreased balance and endurance. Pt is currently min A for bed mobility and modA for transfers and stepping to HoB. Pt with increase dizziness with positional change, unable to focus vision, finger follow, with head turns pt reports nausea and wretches. 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 PT reviewed meds list and not on there. Husband reports he thinks she takes it twice a day. MD informed. Pt is definitely weak however with less dizziness likely would be able to go home with HHPT. PT will continue to follow acutely.    Recommendations for follow up therapy are one component of a multi-disciplinary discharge planning process, led by the attending physician.  Recommendations may be updated based on patient status, additional functional criteria and insurance authorization.  Follow Up Recommendations Skilled nursing-short term rehab (<3 hours/day) Can patient physically be transported by private vehicle: Yes    Assistance Recommended at Discharge Frequent or constant Supervision/Assistance  Patient can return  home with the following  A lot of help with walking and/or transfers;A lot of help with bathing/dressing/bathroom;Assistance with cooking/housework;Direct supervision/assist for medications management;Direct supervision/assist for financial management;Assist for transportation;Help with stairs or ramp for entrance    Equipment Recommendations None recommended by PT     Functional Status Assessment Patient has had a recent decline in their functional status and demonstrates the ability to make significant improvements in function in a reasonable and predictable amount of time.     Precautions / Restrictions Precautions Precautions: Fall;Sternal Restrictions Weight Bearing Restrictions: No      Mobility  Bed Mobility Overal bed mobility: Needs Assistance Bed Mobility: Supine to Sit, Sit to Supine     Supine to sit: Min assist Sit to supine: Min assist   General bed mobility comments: pt able to come to upright, reports increased dizziness seated EoB and requires min A for scooting to EoB    Transfers Overall transfer level: Needs assistance Equipment used: 1 person hand held assist Transfers: Sit to/from Stand, Bed to chair/wheelchair/BSC Sit to Stand: Mod assist, Min assist          Lateral/Scoot Transfers: Mod assist General transfer comment: min A for power up mod A for steadying in standing with face to face transfer, modA for steadying with lateral stepping toward HoB, c/o dizziness throughout       Balance Overall balance assessment: Needs assistance Sitting-balance support: Bilateral upper extremity supported, Feet supported, Feet unsupported Sitting balance-Leahy Scale: Fair     Standing balance support: During functional activity, Bilateral upper extremity supported Standing balance-Leahy Scale: Poor  Pertinent Vitals/Pain Pain Assessment Pain Assessment: No/denies pain    Home Living Family/patient expects to  be discharged to:: Private residence Living Arrangements: Spouse/significant other Available Help at Discharge: Family;Available 24 hours/day Type of Home: House Home Access: Level entry       Home Layout: One level Home Equipment: Conservation officer, nature (2 wheels)      Prior Function Prior Level of Function : Needs assist             Mobility Comments: husband report immediately on return home she was able to get around with assistance but after 2-3 days he had gotten to the point he was carrying her ADLs Comments: husband reports she took increased time but she was able to dress when she first got home, he did all medication mangement     Hand Dominance   Dominant Hand: Right    Extremity/Trunk Assessment   Upper Extremity Assessment Upper Extremity Assessment: Defer to OT evaluation    Lower Extremity Assessment Lower Extremity Assessment: Generalized weakness       Communication   Communication: No difficulties  Cognition Arousal/Alertness: Lethargic Behavior During Therapy: Flat affect Overall Cognitive Status: Within Functional Limits for tasks assessed                                 General Comments: pt answered minimal questions, husband volunteered most information        General Comments General comments (skin integrity, edema, etc.): BP supine 132/93, sitting 124/96 after stepping to HoB 114/82, on entry pt able to open eyes and no c/o dizziness and nausea, with positional change pt dizziness increased with only minor drop in BP, tried to have pt perform finger follow and head turns which increased dizziness and caused nausea, asked about falls with blow to head, pt denied, but does report longstanding history of BBPV taking Meclazine at baseline, revied meds list and Meclazine not on list, informed MD        Assessment/Plan    PT Assessment Patient needs continued PT services  PT Problem List Decreased strength;Decreased activity  tolerance;Decreased balance;Decreased mobility;Cardiopulmonary status limiting activity;Other (comment) (dizziness related to BPPV)       PT Treatment Interventions DME instruction;Gait training;Stair training;Functional mobility training;Therapeutic activities;Therapeutic exercise;Balance training;Cognitive remediation;Patient/family education    PT Goals (Current goals can be found in the Care Plan section)  Acute Rehab PT Goals Patient Stated Goal: feel better PT Goal Formulation: With patient/family Time For Goal Achievement: 10/09/22 Potential to Achieve Goals: Good    Frequency Min 3X/week        AM-PAC PT "6 Clicks" Mobility  Outcome Measure Help needed turning from your back to your side while in a flat bed without using bedrails?: A Little Help needed moving from lying on your back to sitting on the side of a flat bed without using bedrails?: A Little Help needed moving to and from a bed to a chair (including a wheelchair)?: A Little Help needed standing up from a chair using your arms (e.g., wheelchair or bedside chair)?: A Little Help needed to walk in hospital room?: A Lot Help needed climbing 3-5 steps with a railing? : Total 6 Click Score: 15    End of Session Equipment Utilized During Treatment: Gait belt Activity Tolerance: Treatment limited secondary to medical complications (Comment) (dizziness with movement) Patient left: in bed;with call bell/phone within reach;with bed alarm set;with family/visitor present Nurse Communication: Mobility status;Other (  comment) (longstanding BPPV) PT Visit Diagnosis: Unsteadiness on feet (R26.81);Other abnormalities of gait and mobility (R26.89);Muscle weakness (generalized) (M62.81);Difficulty in walking, not elsewhere classified (R26.2);BPPV;Dizziness and giddiness (R42)    Time: 3736-6815 PT Time Calculation (min) (ACUTE ONLY): 25 min   Charges:   PT Evaluation $PT Eval Moderate Complexity: 1 Mod PT  Treatments $Therapeutic Activity: 8-22 mins        Verland Sprinkle B. Migdalia Dk PT, DPT Acute Rehabilitation Services Please use secure chat or  Call Office [phone]   [place] 09/25/2022, 3:00 PM

## 2022-09-25 NOTE — Evaluation (Signed)
Occupational Therapy Evaluation Patient Details Name: Jasmine Keith MRN: [id number] DOB: 1951/02/14 Today's Date: 09/25/2022   History of Present Illness 71 y.o. female who presented to the ED on 09/21/2022 by EMS with hypotension (60/palpable), bradycardia (30's bpm), and confusion in the setting of poor oral intake since discharge 11/12 In ED found to have shock due to hypovolemia and bradycardia, admitted to ICU ultimately hemodynamically stabilizing and transferring to the progressive care unit 11/18. PMH: CAD s/p CABG 09/04/2022, T2DM, GERD, stage IIIa CKD, CDiff colitis July 2023   Clinical Impression   Jasmine Keith was evaluated s/p the above admission list, of note pt recently admitted 10/30-11/12 and discharged home after CABG with assist from husband. Upon evaluation pt had functional limitations due to generalized weakness, poor activity tolerance, general malaise, sternal precautions and cardiopulmonary endurance. Overall pt required min A for bed mobility, and decline further OOB attempts. Pt did not report dizziness throughout session. Due to deficits listed below, she currently required up to mod A for ADLs. OT to continue to follow acutely. Recommend SNF at d/c for continued therapies.     Recommendations for follow up therapy are one component of a multi-disciplinary discharge planning process, led by the attending physician.  Recommendations may be updated based on patient status, additional functional criteria and insurance authorization.   Follow Up Recommendations  Skilled nursing-short term rehab (<3 hours/day)     Assistance Recommended at Discharge Frequent or constant Supervision/Assistance  Patient can return home with the following A little help with walking and/or transfers;A little help with bathing/dressing/bathroom;Assist for transportation;Help with stairs or ramp for entrance;Assistance with cooking/housework    Functional Status Assessment  Patient has had a  recent decline in their functional status and demonstrates the ability to make significant improvements in function in a reasonable and predictable amount of time.  Equipment Recommendations  None recommended by OT    Recommendations for Other Services       Precautions / Restrictions Precautions Precautions: Fall;Sternal Restrictions Weight Bearing Restrictions: No Other Position/Activity Restrictions: sternal precautions      Mobility Bed Mobility Overal bed mobility: Needs Assistance Bed Mobility: Rolling, Sidelying to Sit, Sit to Sidelying Rolling: Min assist Sidelying to sit: Min assist     Sit to sidelying: Min assist      Transfers Overall transfer level: Needs assistance                 General transfer comment: pt declined      Balance Overall balance assessment: Needs assistance Sitting-balance support: Bilateral upper extremity supported, Feet supported, Feet unsupported Sitting balance-Leahy Scale: Fair                                     ADL either performed or assessed with clinical judgement   ADL Overall ADL's : Needs assistance/impaired Eating/Feeding: Independent;Sitting   Grooming: Set up;Sitting   Upper Body Bathing: Supervision/ safety;Set up;Sitting   Lower Body Bathing: Maximal assistance;Sit to/from stand   Upper Body Dressing : Min guard;Sitting Upper Body Dressing Details (indicate cue type and reason): cues for precautions Lower Body Dressing: Moderate assistance;Sit to/from stand   Toilet Transfer: Minimal assistance;Stand-pivot;Rolling walker (2 wheels)   Toileting- Clothing Manipulation and Hygiene: Supervision/safety;Sitting/lateral lean       Functional mobility during ADLs: Minimal assistance;Rolling walker (2 wheels) General ADL Comments: limited assessment to EOB, pt declined to attempted OOB ADLs. cues for sternal precautions  and generalized weakness     Vision Baseline Vision/History: 0 No  visual deficits Vision Assessment?: No apparent visual deficits     Perception Perception Perception Tested?: No   Praxis Praxis Praxis tested?: Not tested    Pertinent Vitals/Pain Pain Assessment Pain Assessment: No/denies pain     Hand Dominance Right   Extremity/Trunk Assessment Upper Extremity Assessment Upper Extremity Assessment: Generalized weakness   Lower Extremity Assessment Lower Extremity Assessment: Generalized weakness   Cervical / Trunk Assessment Cervical / Trunk Assessment: Other exceptions Cervical / Trunk Exceptions: sternal precautions   Communication Communication Communication: No difficulties   Cognition Arousal/Alertness: Lethargic Behavior During Therapy: Flat affect Overall Cognitive Status: Within Functional Limits for tasks assessed                                 General Comments: pt answered minimal questions, husband volunteered most information     General Comments  VSS, no family present    Exercises     Shoulder Instructions      Home Living Family/patient expects to be discharged to:: Private residence Living Arrangements: Spouse/significant other Available Help at Discharge: Family;Available 24 hours/day Type of Home: House Home Access: Level entry     Home Layout: One level     Bathroom Shower/Tub: Teacher, early years/pre: Handicapped height     Home Equipment: Conservation officer, nature (2 wheels)          Prior Functioning/Environment Prior Level of Function : Needs assist             Mobility Comments: husband report immediately on return home she was able to get around with assistance but after 2-3 days he had gotten to the point he was carrying her ADLs Comments: husband reports she took increased time but she was able to dress when she first got home, he did all medication mangement        OT Problem List: Decreased strength;Decreased range of motion;Impaired balance (sitting and/or  standing);Decreased activity tolerance;Decreased safety awareness;Decreased knowledge of precautions;Decreased knowledge of use of DME or AE;Pain      OT Treatment/Interventions: Self-care/ADL training;DME and/or AE instruction;Therapeutic exercise;Energy conservation;Therapeutic activities;Patient/family education;Balance training    OT Goals(Current goals can be found in the care plan section) Acute Rehab OT Goals Patient Stated Goal: to get stronger OT Goal Formulation: With patient Time For Goal Achievement: 10/09/22 Potential to Achieve Goals: Good ADL Goals Pt Will Perform Upper Body Dressing: with modified independence;sitting Pt Will Perform Lower Body Dressing: with supervision;sit to/from stand Pt Will Transfer to Toilet: with supervision;ambulating Additional ADL Goal #1: Pt will maintain sternal precautions 100% of session  OT Frequency: Min 2X/week       AM-PAC OT "6 Clicks" Daily Activity     Outcome Measure Help from another person eating meals?: None Help from another person taking care of personal grooming?: A Little Help from another person toileting, which includes using toliet, bedpan, or urinal?: A Little Help from another person bathing (including washing, rinsing, drying)?: A Lot Help from another person to put on and taking off regular upper body clothing?: A Little Help from another person to put on and taking off regular lower body clothing?: A Lot 6 Click Score: 17   End of Session Nurse Communication: Mobility status  Activity Tolerance: Patient tolerated treatment well Patient left: in bed;with bed alarm set;with call bell/phone within reach  OT Visit Diagnosis: Other abnormalities  of gait and mobility (R26.89);Unsteadiness on feet (R26.81);Muscle weakness (generalized) (M62.81)                Time: 7282-0601 OT Time Calculation (min): 15 min Charges:  OT General Charges $OT Visit: 1 Visit OT Evaluation $OT Eval Moderate Complexity: 1  Mod    Jasmine Keith 09/25/2022, 5:49 PM

## 2022-09-25 NOTE — Plan of Care (Signed)
  Problem: Education: Goal: Will demonstrate proper wound care and an understanding of methods to prevent future damage Outcome: Progressing Goal: Knowledge of disease or condition will improve Outcome: Progressing Goal: Knowledge of the prescribed therapeutic regimen will improve Outcome: Progressing Goal: Individualized Educational Video(s) Outcome: Progressing   Problem: Activity: Goal: Risk for activity intolerance will decrease Outcome: Progressing   Problem: Cardiac: Goal: Will achieve and/or maintain hemodynamic stability Outcome: Progressing   Problem: Clinical Measurements: Goal: Postoperative complications will be avoided or minimized Outcome: Progressing   Problem: Respiratory: Goal: Respiratory status will improve Outcome: Progressing   Problem: Skin Integrity: Goal: Wound healing without signs and symptoms of infection Outcome: Progressing Goal: Risk for impaired skin integrity will decrease Outcome: Progressing   Problem: Urinary Elimination: Goal: Ability to achieve and maintain adequate renal perfusion and functioning will improve Outcome: Progressing   Problem: Education: Goal: Knowledge of General Education information will improve Description: Including pain rating scale, medication(s)/side effects and non-pharmacologic comfort measures Outcome: Progressing   Problem: Health Behavior/Discharge Planning: Goal: Ability to manage health-related needs will improve Outcome: Progressing   Problem: Clinical Measurements: Goal: Ability to maintain clinical measurements within normal limits will improve Outcome: Progressing Goal: Will remain free from infection Outcome: Progressing Goal: Diagnostic test results will improve Outcome: Progressing Goal: Respiratory complications will improve Outcome: Progressing Goal: Cardiovascular complication will be avoided Outcome: Progressing   Problem: Activity: Goal: Risk for activity intolerance will  decrease Outcome: Progressing   Problem: Nutrition: Goal: Adequate nutrition will be maintained Outcome: Progressing   Problem: Coping: Goal: Level of anxiety will decrease Outcome: Progressing   Problem: Elimination: Goal: Will not experience complications related to bowel motility Outcome: Progressing Goal: Will not experience complications related to urinary retention Outcome: Progressing   Problem: Pain Managment: Goal: General experience of comfort will improve Outcome: Progressing   Problem: Safety: Goal: Ability to remain free from injury will improve Outcome: Progressing   Problem: Skin Integrity: Goal: Risk for impaired skin integrity will decrease Outcome: Progressing   Problem: Education: Goal: Ability to describe self-care measures that may prevent or decrease complications (Diabetes Survival Skills Education) will improve Outcome: Progressing Goal: Individualized Educational Video(s) Outcome: Progressing   Problem: Coping: Goal: Ability to adjust to condition or change in health will improve Outcome: Progressing   Problem: Fluid Volume: Goal: Ability to maintain a balanced intake and output will improve Outcome: Progressing   Problem: Health Behavior/Discharge Planning: Goal: Ability to identify and utilize available resources and services will improve Outcome: Progressing Goal: Ability to manage health-related needs will improve Outcome: Progressing   Problem: Metabolic: Goal: Ability to maintain appropriate glucose levels will improve Outcome: Progressing   Problem: Nutritional: Goal: Maintenance of adequate nutrition will improve Outcome: Progressing Goal: Progress toward achieving an optimal weight will improve Outcome: Progressing   Problem: Skin Integrity: Goal: Risk for impaired skin integrity will decrease Outcome: Progressing   Problem: Tissue Perfusion: Goal: Adequacy of tissue perfusion will improve Outcome: Progressing

## 2022-09-25 NOTE — Progress Notes (Addendum)
Rounding Note    Patient Name: Jasmine Keith Date of Encounter: 09/25/2022  [place] Cardiologist: Jenean Lindau, MD   Subjective   She feels better today.  Oriented   Inpatient Medications    Scheduled Meds:  amLODipine  10 mg Oral Daily   busPIRone  7.5 mg Oral BID   feeding supplement  237 mL Oral TID BM   fenofibrate  160 mg Oral Daily   heparin  5,000 Units Subcutaneous Q8H   hydrALAZINE  50 mg Oral TID   insulin aspart  0-5 Units Subcutaneous QHS   insulin aspart  0-9 Units Subcutaneous TID WC   levothyroxine  25 mcg Oral Q0600   venlafaxine XR  75 mg Oral Q breakfast   Continuous Infusions:  PRN Meds: dicyclomine, hydrALAZINE, labetalol, ondansetron, mouth rinse, oxyCODONE, traZODone   Vital Signs    Vitals:   09/25/22 0420 09/25/22 0500 09/25/22 0608 09/25/22 0754  BP: 118/81  (!) 164/65 (!) 166/74  Pulse: 89  77 86  Resp: 16   14  Temp: 98 F (36.7 C)   98 F (36.7 C)  TempSrc: Oral   Oral  SpO2: 96%  96% 97%  Weight:  50.4 kg    Height:        Intake/Output Summary (Last 24 hours) at 09/25/2022 0807 Last data filed at 09/25/2022 0600 Gross per 24 hour  Intake 240 ml  Output 1450 ml  Net -1210 ml      09/25/2022    5:00 AM 09/24/2022    4:28 AM 09/23/2022    4:48 AM  Last 3 Weights  Weight (lbs) 111 lb 1.8 oz 118 lb 2.7 oz 123 lb 6.4 oz  Weight (kg) 50.4 kg 53.6 kg 55.974 kg      Telemetry    SR - Personally Reviewed  ECG    No new - Personally Reviewed  Physical Exam   GEN: No acute distress.   Neck: No JVD Cardiac: RRR, no murmurs, rubs, or gallops. Chest wall incision healing no redness or drainage.  Respiratory: Clear to auscultation bilaterally. GI: Soft, nontender, non-distended  MS: No edema; No deformity. Neuro:  Nonfocal  Psych: Normal affect   Labs    High Sensitivity Troponin:   Recent Labs  Lab 09/21/22 1315 09/21/22 1600 09/23/22 1503  TROPONINIHS 36* 33* 34*      Chemistry Recent Labs  Lab 09/22/22 0842 09/23/22 0131 09/24/22 0752 09/25/22 0229  NA 140 140 139 141  K 4.6 4.6 3.9 4.0  CL 110 111 103 108  CO2 17* 18* 21* 22  GLUCOSE 209* 157* 173* 178*  BUN 64* 54* 32* 23  CREATININE 2.09* 1.65* 1.24* 1.03*  CALCIUM 8.4* 8.7* 9.1 8.9  MG 1.3* 2.4  --   --   PROT  --  5.5* 6.1* 5.5*  ALBUMIN  --  2.3* 2.4* 2.2*  AST  --  11* 19 17  ALT  --  '14 15 12  '$ ALKPHOS  --  72 86 70  BILITOT  --  0.2* 0.8 0.3  GFRNONAA 25* 33* 47* 59*  ANIONGAP '13 11 15 11    '$ Lipids No results for input(s): "CHOL", "TRIG", "HDL", "LABVLDL", "LDLCALC", "CHOLHDL" in the last 168 hours.  Hematology Recent Labs  Lab 09/23/22 1955 09/24/22 0752 09/25/22 0229  WBC 8.7 8.1 7.1  RBC 3.57* 4.17 4.05  HGB 9.4* 10.9* 10.9*  HCT 29.3* 33.7* 32.8*  MCV 82.1 80.8 81.0  MCH 26.3  26.1 26.9  MCHC 32.1 32.3 33.2  RDW 15.8* 15.5 15.4  PLT 555* 599* 515*   Thyroid  Recent Labs  Lab 09/21/22 1348  TSH 3.057  FREET4 1.19*    BNP Recent Labs  Lab 09/21/22 1315  BNP 2,564.7*    DDimer No results for input(s): "DDIMER" in the last 168 hours.   Radiology    DG CHEST PORT 1 VIEW  Result Date: 09/24/2022 CLINICAL DATA:  Status post CABG EXAM: PORTABLE CHEST 1 VIEW COMPARISON:  September 22, 2022 FINDINGS: Sternotomy wires are intact. The heart size is unremarkable. The hila and mediastinum are normal. The right lung is clear. Streaky opacity in the left mid lung may represent atelectasis. No other acute abnormalities. No overt edema. IMPRESSION: Streaky opacity in the left mid lung may represent atelectasis. No other acute abnormalities. Electronically Signed   By: Dorise Bullion III M.D.   On: 09/24/2022 09:19    Cardiac Studies   Echo 09/22/22 IMPRESSIONS     1. Left ventricular ejection fraction, by estimation, is >75%. The left  ventricle has hyperdynamic function. The left ventricle has no regional  wall motion abnormalities.   2. Right ventricular  systolic function is normal. The right ventricular  size is normal. There is normal pulmonary artery systolic pressure. The  estimated right ventricular systolic pressure is 53.9 mmHg.   3. The mitral valve is grossly normal. Trivial mitral valve  regurgitation. No evidence of mitral stenosis.   4. The inferior vena cava is normal in size with <50% respiratory  variability, suggesting right atrial pressure of 8 mmHg.   Comparison(s): No significant change from prior study.   FINDINGS   Left Ventricle: Left ventricular ejection fraction, by estimation, is  >75%. The left ventricle has hyperdynamic function. The left ventricle has  no regional wall motion abnormalities. The left ventricular internal  cavity size was normal in size. There  is no left ventricular hypertrophy.   Right Ventricle: The right ventricular size is normal. No increase in  right ventricular wall thickness. Right ventricular systolic function is  normal. There is normal pulmonary artery systolic pressure. The tricuspid  regurgitant velocity is 2.08 m/s, and   with an assumed right atrial pressure of 8 mmHg, the estimated right  ventricular systolic pressure is 76.7 mmHg.   Pericardium: There is no evidence of pericardial effusion.   Mitral Valve: The mitral valve is grossly normal. Trivial mitral valve  regurgitation. No evidence of mitral valve stenosis.   Tricuspid Valve: The tricuspid valve is grossly normal. Tricuspid valve  regurgitation is trivial. No evidence of tricuspid stenosis.   Aorta: The aortic root is normal in size and structure.   Venous: The inferior vena cava is normal in size with less than 50%  respiratory variability, suggesting right atrial pressure of 8 mmHg.   Additional Comments: Spectral Doppler performed. Color Doppler performed.        Patient Profile     71 y.o. female CAD, HTN, DM, tobacco abuse with COPD, CABG X 3 09/04/22 (LIMA to LAD, R SVG to OM, SVG to PDA) discharged  09/17/22 on amlodipine, coreg 25 BID, clonidine 0.1 bid, hydralazine 100 mg TID, entresto 49/51 BID admitted with symptomatic brady (K+5.2 on admit), anemia, azotemia and hypotension and AKI.    Assessment & Plan    Symptomatic brady on coreg, K+ 5.2 and AKI.  Now resolved to sr BB held and IV fluids given.   Anemia down to 7.0 and transfused 1  unit PRBCs --today Hgb 10.9  AKI with cr up from 1.3 to 2.25 on admit now at 1.03.  after IV fluids.   Recent CABG X 3 09/04/22  --TTE stable with normal EF   HTN  --was difficult to control post op --now on amlodipine 10 mg daily, hydralazine 50 mg TID --BP 164/65 -increase hydralazine. --hold entresto ? stop   DM/hypothyroidism/COPD/acute metabolic encephalopathy per IM      For questions or updates, please contact [place] Please consult [email or web address] for contact info under        Signed, Cecilie Kicks, NP  09/25/2022, 8:07 AM    Personally seen and examined. Agree with above.  71 year old with CABG x3 admitted with symptomatic bradycardia.  Has some mid back pain.  Likely related to musculoskeletal discomfort being in bed.  Symptomatic bradycardia AKI CABG x3 recently October/2023 - Had been on carvedilol 25 mg twice daily as well as clonidine 0.1 twice daily.  Subsequently after beta-blocker held and IV fluids given, this has resolved.  She had AKI associated with this.  Creatinine was 2.25 on admit now back to 1.03.  She has been stable from her CABG standpoint without any anginal symptoms.  Blood pressure has been difficult to control postoperatively.  164/65, hydralazine has been increased.  She is also on amlodipine.  Her Delene Loll has been held, previously on 49/51 twice daily.  It may be beneficial to resume now that acute kidney injury has resolved.  Discussed with Dr. Florene Glen.  Candee Furbish, MD

## 2022-09-26 DIAGNOSIS — R001 Bradycardia, unspecified: Secondary | ICD-10-CM | POA: Diagnosis not present

## 2022-09-26 LAB — CBC WITH DIFFERENTIAL/PLATELET
Abs Immature Granulocytes: 0.04 10*3/uL (ref 0.00–0.07)
Basophils Absolute: 0 10*3/uL (ref 0.0–0.1)
Basophils Relative: 1 %
Eosinophils Absolute: 0.2 10*3/uL (ref 0.0–0.5)
Eosinophils Relative: 4 %
HCT: 33.7 % — ABNORMAL LOW (ref 36.0–46.0)
Hemoglobin: 10.9 g/dL — ABNORMAL LOW (ref 12.0–15.0)
Immature Granulocytes: 1 %
Lymphocytes Relative: 19 %
Lymphs Abs: 1.2 10*3/uL (ref 0.7–4.0)
MCH: 26.3 pg (ref 26.0–34.0)
MCHC: 32.3 g/dL (ref 30.0–36.0)
MCV: 81.4 fL (ref 80.0–100.0)
Monocytes Absolute: 0.4 10*3/uL (ref 0.1–1.0)
Monocytes Relative: 6 %
Neutro Abs: 4.4 10*3/uL (ref 1.7–7.7)
Neutrophils Relative %: 69 %
Platelets: 516 10*3/uL — ABNORMAL HIGH (ref 150–400)
RBC: 4.14 MIL/uL (ref 3.87–5.11)
RDW: 15.2 % (ref 11.5–15.5)
WBC: 6.4 10*3/uL (ref 4.0–10.5)
nRBC: 0 % (ref 0.0–0.2)

## 2022-09-26 LAB — GLUCOSE, CAPILLARY
Glucose-Capillary: 167 mg/dL — ABNORMAL HIGH (ref 70–99)
Glucose-Capillary: 251 mg/dL — ABNORMAL HIGH (ref 70–99)
Glucose-Capillary: 230 mg/dL — ABNORMAL HIGH (ref 70–99)
Glucose-Capillary: 116 mg/dL — ABNORMAL HIGH (ref 70–99)

## 2022-09-26 LAB — MAGNESIUM: Magnesium: 1.4 mg/dL — ABNORMAL LOW (ref 1.7–2.4)

## 2022-09-26 LAB — COMPREHENSIVE METABOLIC PANEL
ALT: 14 U/L (ref 0–44)
AST: 18 U/L (ref 15–41)
Albumin: 2.4 g/dL — ABNORMAL LOW (ref 3.5–5.0)
Alkaline Phosphatase: 73 U/L (ref 38–126)
Anion gap: 11 (ref 5–15)
BUN: 17 mg/dL (ref 8–23)
CO2: 24 mmol/L (ref 22–32)
Calcium: 9 mg/dL (ref 8.9–10.3)
Chloride: 104 mmol/L (ref 98–111)
Creatinine, Ser: 1.03 mg/dL — ABNORMAL HIGH (ref 0.44–1.00)
GFR, Estimated: 59 mL/min — ABNORMAL LOW (ref >60)
Glucose, Bld: 133 mg/dL — ABNORMAL HIGH (ref 70–99)
Potassium: 3.6 mmol/L (ref 3.5–5.1)
Sodium: 139 mmol/L (ref 135–145)
Total Bilirubin: 0.6 mg/dL (ref 0.3–1.2)
Total Protein: 5.8 g/dL — ABNORMAL LOW (ref 6.5–8.1)

## 2022-09-26 LAB — CULTURE, BLOOD (ROUTINE X 2)
Culture: NO GROWTH
Culture: NO GROWTH
Special Requests: ADEQUATE
Special Requests: ADEQUATE

## 2022-09-26 LAB — PHOSPHORUS: Phosphorus: 2.7 mg/dL (ref 2.5–4.6)

## 2022-09-26 MED ORDER — CARVEDILOL 6.25 MG PO TABS
6.2500 mg | ORAL_TABLET | Freq: Two times a day (BID) | ORAL | Status: DC
  Administered 2022-09-26 – 2022-09-27 (×4): 6.25 mg via ORAL
  Filled 2022-09-26 (×4): qty 1

## 2022-09-26 MED ORDER — POTASSIUM CHLORIDE CRYS ER 20 MEQ PO TBCR
40.0000 meq | EXTENDED_RELEASE_TABLET | Freq: Once | ORAL | Status: AC
  Administered 2022-09-26: 40 meq via ORAL
  Filled 2022-09-26: qty 2

## 2022-09-26 MED ORDER — MAGNESIUM SULFATE 4 GM/100ML IV SOLN
4.0000 g | Freq: Once | INTRAVENOUS | Status: AC
  Administered 2022-09-26: 4 g via INTRAVENOUS
  Filled 2022-09-26: qty 100

## 2022-09-26 MED ORDER — SACUBITRIL-VALSARTAN 49-51 MG PO TABS
1.0000 | ORAL_TABLET | Freq: Two times a day (BID) | ORAL | Status: DC
  Administered 2022-09-26 – 2022-09-27 (×3): 1 via ORAL
  Filled 2022-09-26 (×5): qty 1

## 2022-09-26 NOTE — Consult Note (Signed)
   [facility] Inpatient Consult   09/26/2022  Skarlet Lyons [facility] 1951-04-22 [id number] [place] [facility] [ACO] Patient: Medicare ACO REACH  Primary Care Provider:  Mackie Pai, PA-C with [facility] is listed for the [facility] follow up   If the patient goes to a [facility] affiliated facility then, patient can be followed by [facility] PAC RN with traditional Medicare and approved Medicare Advantage plans.    Plan:   Notify [facility] RN who can follow for any known or needs for transitional care needs for returning to post facility care coordination needs to return to community.  For questions or referrals, please contact:   Natividad Brood, RN BSN [place]  [phone] business mobile phone Toll free office [phone]  *[place]  [phone] Fax number: [phone] [email or web address]
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
## 2022-09-26 NOTE — Progress Notes (Signed)
Mobility Specialist - Progress Note   09/26/22 1610  Mobility  Activity Dangled on edge of bed  Level of Assistance Minimal assist, patient does 75% or more  Assistive Device None  Activity Response Tolerated poorly  Mobility Referral Yes  $Mobility charge 1 Mobility   Pt received in bed and reluctantly agreeable to mobility. Pt was MinA to sit EOB. Upon sitting EOB pt expressed feeling dizzy and lightheadness. Pt requested to lay back down despite max encouragement. Pt was returned to bed with all needs met.   Franki Monte  Mobility Specialist Please contact via Solicitor or Rehab office at [phone]

## 2022-09-26 NOTE — Discharge Instructions (Signed)

## 2022-09-26 NOTE — Progress Notes (Signed)
PROGRESS NOTE    Jasmine Keith  IRC:[id number] DOB: [birth year] DOA: 09/21/2022 PCP: Mackie Pai, PA-C  Chief Complaint  Patient presents with   Bradycardia   Weakness    Brief Narrative:  Jasmine Keith is Jasmine Keith 71 y.o. female with Jasmine Keith history of CAD s/p CABG 09/04/2022, T2DM, GERD, stage IIIa CKD, CDiff colitis July 2023 who presented to the ED on 09/21/2022 by EMS with hypotension (60/palpable), bradycardia (30's bpm), and confusion in the setting of poor oral intake since discharge. She had an episode of hypoglycemia at home due to poor oral intake, and had otherwise been taking medications (administered by husband), including clonidine and coreg as prescribed at discharge.    She had been discharged 11/12 after CABG with postoperative course complicated by hypertension improved with medication adjustments. At home she had ongoing diarrhea.   In the ED she was found to have shock due to hypovolemia and bradycardia, admitted to ICU ultimately hemodynamically stabilizing and transferring to the progressive care unit 11/18.   Significant events 11/16 - BIB EMS for AMS, bradycardia, hypotension. Epi gtt. Later given glucagon bolus and gtt.  11/17 off epi  11/18 picked up by [facility]:   Principal Problem:   Bradycardia Active Problems:   Adverse reaction to beta-blocker  Sinus bradycardia due to beta blocker: Improved s/p glucagon, off pressors. - resume coreg per cards - continue to hold clonidine   CAD s/p CABG, HTN:  - Echo showed no pericardial effusion, ECG repeated shows no acutely ischemic features, troponin flat, max is 36. Cardiology consult appreciated - Continue HTN management. Increased hydralazine per cardiology.  Entresto restarted.  Cardiology has resumed low dose coreg.   COPD:  - Incentive spirometry at bedside   Diarrhea: No abdominal pain/tenderness, no leukocytosis. Continue to replete GI losses and monitor.  - GI note by Dr. Lyndel Safe  reviewed 07/18/2022 recommend bentyl AC/HS prn, which we have ordered as TID prn. Also notes to avoid magnesium supplementation if recurrent diarrhea (was resolved at that time).  - Caution with PPI in setting of previous C. diff.    Acute metabolic encephalopathy, anxiety: Initially due to shock, also having reversal of day-night cycles still.  - Restart home effexor qAM, and buspar. Add trazodone qHS. - Check ammonia (slightly elevated, unclear significance, will recheck). LFTs wnl.  - delirium precautions   T2DM: HbA1c 6.9% recently.  - Avoid hypoglycemia, stopped home levemir 15u  - Continue SSI, remains at inpatient goal   Dyspnea: Improved with transfusion, not resolved. Pt agrees anxiety could be playing Jasmine Keith role. CXR this AM showing only streaky atelectasis, no infiltrate or airspace or interstitial edema or effusions.  - Incentive spirometry.  - Monitor SpO2.    Symptomatic anemia of critical illness: No bleeding noted, hgb trending slowly downward.  - s/p 1u PRBC 11/18, hgb up as anticipated.  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 - FOBT pending - Continue heparin 5,000u q8h VTE ppx.   AKI on stage IIIa CKD: Continues to improve.  - will resume low dose entresto, follow renal function - Monitor daily.   Hypothyroidism: TSH wnl at 3.057, free T4 checked on admission to ICU was 1.19 (ULN 1.12) - Recheck TFT outpatient  - Continue low dose synthroid   Lactic acidosis: Improved.    Mixed shock due to hypovolemia and medication-induced bradycardia: Resolved.   Hyperkalemia: Resolved  Chronic Vertigo: per therapy discussion  with patient, she notes longstanding hx BPPV, taking meclizine as needed Will follow orthostatics  She needs PT/OT given general deconditioning    DVT prophylaxis: heparin Code Status: full Family Communication: none at bedside Disposition:   Status is: Inpatient Remains  inpatient appropriate because: pending further improvement   Consultants:  Cardiology PCCM CT surgery c/s requested   Procedures:  Echo IMPRESSIONS     1. Left ventricular ejection fraction, by estimation, is >75%. The left  ventricle has hyperdynamic function. The left ventricle has no regional  wall motion abnormalities.   2. Right ventricular systolic function is normal. The right ventricular  size is normal. There is normal pulmonary artery systolic pressure. The  estimated right ventricular systolic pressure is 60.4 mmHg.   3. The mitral valve is grossly normal. Trivial mitral valve  regurgitation. No evidence of mitral stenosis.   4. The inferior vena cava is normal in size with <50% respiratory  variability, suggesting right atrial pressure of 8 mmHg.   Comparison(s): No significant change from prior study.   Antimicrobials:  Anti-infectives (From admission, onward)    None       Subjective: No new complaints today Husband at bedside, he was frustrated with events of this and prior hospitalization  Objective: Vitals:   09/26/22 0030 09/26/22 0435 09/26/22 0837 09/26/22 1403  BP: (!) 167/70 (!) 159/75 (!) 187/75 (!) 145/54  Pulse: 96 97 82 82  Resp: '16 16 16   '$ Temp: 98 F (36.7 C) 97.9 F (36.6 C) 98 F (36.7 C)   TempSrc: Oral Oral Oral   SpO2: 96% 98% 98% 97%  Weight:  49.5 kg    Height:        Intake/Output Summary (Last 24 hours) at 09/26/2022 1524 Last data filed at 09/26/2022 1404 Gross per 24 hour  Intake 780 ml  Output 500 ml  Net 280 ml   Filed Weights   09/24/22 0428 09/25/22 0500 09/26/22 0435  Weight: 53.6 kg 50.4 kg 49.5 kg    Examination:  General: No acute distress. Cardiovascular: RRR Lungs: unlabored Abdomen: Soft, nontender, nondistended Neurological: Alert and oriented 3. Moves all extremities 4 . Cranial nerves II through XII grossly intact. Extremities: No clubbing or cyanosis. No edema.   Data Reviewed: I have  personally reviewed following labs and imaging studies  CBC: Recent Labs  Lab 09/21/22 1315 09/21/22 1329 09/23/22 0131 09/23/22 1955 09/24/22 0752 09/25/22 0229 09/26/22 0139  WBC 9.0   < > 8.4 8.7 8.1 7.1 6.4  NEUTROABS 7.9*  --   --   --   --   --  4.4  HGB 8.6*   < > 7.0* 9.4* 10.9* 10.9* 10.9*  HCT 28.9*   < > 21.7* 29.3* 33.7* 32.8* 33.7*  MCV 86.8   < > 83.1 82.1 80.8 81.0 81.4  PLT 655*   < > 541* 555* 599* 515* 516*   < > = values in this interval not displayed.    Basic Metabolic Panel: Recent Labs  Lab 09/22/22 0842 09/23/22 0131 09/24/22 0752 09/25/22 0229 09/26/22 0139  NA 140 140 139 141 139  K 4.6 4.6 3.9 4.0 3.6  CL 110 111 103 108 104  CO2 17* 18* 21* 22 24  GLUCOSE 209* 157* 173* 178* 133*  BUN 64* 54* 32* 23 17  CREATININE 2.09* 1.65* 1.24* 1.03* 1.03*  CALCIUM 8.4* 8.7* 9.1 8.9 9.0  MG 1.3* 2.4  --   --  1.4*  PHOS 4.4  --   --   --  2.7    GFR: Estimated Creatinine Clearance: 38.9 mL/min (Alston Berrie) (by C-G formula based on SCr of 1.03 mg/dL (H)).  Liver Function Tests: Recent Labs  Lab 09/21/22 1315 09/23/22 0131 09/24/22 0752 09/25/22 0229 09/26/22 0139  AST 28 11* '19 17 18  '$ ALT '20 14 15 12 14  '$ ALKPHOS 105 72 86 70 73  BILITOT 0.4 0.2* 0.8 0.3 0.6  PROT 6.3* 5.5* 6.1* 5.5* 5.8*  ALBUMIN 2.5* 2.3* 2.4* 2.2* 2.4*    CBG: Recent Labs  Lab 09/25/22 1200 09/25/22 1641 09/25/22 2053 09/26/22 0835 09/26/22 1222  GLUCAP 208* 119* 184* 167* 251*     Recent Results (from the past 240 hour(s))  Resp Panel by RT-PCR (Flu Delonna Ney&B, Covid) Anterior Nasal Swab     Status: None   Collection Time: 09/21/22  1:03 PM   Specimen: Anterior Nasal Swab  Result Value Ref Range Status   SARS Coronavirus 2 by RT PCR NEGATIVE NEGATIVE Final    Comment: (NOTE) SARS-CoV-2 target nucleic acids are NOT DETECTED.  The SARS-CoV-2 RNA is generally detectable in upper respiratory specimens during the acute phase of infection. The lowest concentration of  SARS-CoV-2 viral copies this assay can detect is 138 copies/mL. Alix Lahmann negative result does not preclude SARS-Cov-2 infection and should not be used as the sole basis for treatment or other patient management decisions. Meilin Brosh negative result may occur with  improper specimen collection/handling, submission of specimen other than nasopharyngeal swab, presence of viral mutation(s) within the areas targeted by this assay, and inadequate number of viral copies(<138 copies/mL). Bunyan Brier negative result must be combined with clinical observations, patient history, and epidemiological information. The expected result is Negative.  Fact Sheet for Patients:  EntrepreneurPulse.com.au  Fact Sheet for Healthcare Providers:  IncredibleEmployment.be  This test is no t yet approved or cleared by the [place] FDA and  has been authorized for detection and/or diagnosis of SARS-CoV-2 by FDA under an Emergency Use Authorization (EUA). This EUA will remain  in effect (meaning this test can be used) for the duration of the COVID-19 declaration under Section 564(b)(1) of the Act, 21 U.S.C.section 360bbb-3(b)(1), unless the authorization is terminated  or revoked sooner.       Influenza Jarold Macomber by PCR NEGATIVE NEGATIVE Final   Influenza B by PCR NEGATIVE NEGATIVE Final    Comment: (NOTE) The Xpert Xpress SARS-CoV-2/FLU/RSV plus assay is intended as an aid in the diagnosis of influenza from Nasopharyngeal swab specimens and should not be used as Kynsleigh Westendorf sole basis for treatment. Nasal washings and aspirates are unacceptable for Xpert Xpress SARS-CoV-2/FLU/RSV testing.  Fact Sheet for Patients: EntrepreneurPulse.com.au  Fact Sheet for Healthcare Providers: IncredibleEmployment.be  This test is not yet approved or cleared by the [place] FDA and has been authorized for detection and/or diagnosis of SARS-CoV-2 by FDA under an Emergency Use  Authorization (EUA). This EUA will remain in effect (meaning this test can be used) for the duration of the COVID-19 declaration under Section 564(b)(1) of the Act, 21 U.S.C. section 360bbb-3(b)(1), unless the authorization is terminated or revoked.  Performed at [facility], [place] [address] [place], [place] [address]   Culture, blood (routine x 2)     Status: None   Collection Time: 09/21/22  1:03 PM   Specimen: BLOOD  Result Value Ref Range Status   Specimen Description BLOOD LEFT ANTECUBITAL  Final   Special Requests   Final    BOTTLES DRAWN AEROBIC AND ANAEROBIC Blood Culture adequate volume  Culture   Final    NO GROWTH 5 DAYS Performed at [facility], [place] [address], [place], [place] [address]    Report Status 09/26/2022 FINAL  Final  Culture, blood (routine x 2)     Status: None   Collection Time: 09/21/22  1:20 PM   Specimen: BLOOD LEFT HAND  Result Value Ref Range Status   Specimen Description BLOOD LEFT HAND  Final   Special Requests   Final    BOTTLES DRAWN AEROBIC AND ANAEROBIC Blood Culture adequate volume   Culture   Final    NO GROWTH 5 DAYS Performed at [facility], [place] [address], [place], [place] [address]    Report Status 09/26/2022 FINAL  Final         Radiology Studies: No results found.      Scheduled Meds:  amLODipine  10 mg Oral Daily   busPIRone  7.5 mg Oral BID   carvedilol  6.25 mg Oral BID WC   feeding supplement  237 mL Oral TID BM   fenofibrate  160 mg Oral Daily   heparin  5,000 Units Subcutaneous Q8H   hydrALAZINE  100 mg Oral TID   insulin aspart  0-5 Units Subcutaneous QHS   insulin aspart  0-9 Units Subcutaneous TID WC   levothyroxine  25 mcg Oral Q0600   lidocaine  1 patch Transdermal Q24H   sacubitril-valsartan  1 tablet Oral BID   venlafaxine XR  75 mg Oral Q breakfast   Continuous Infusions:   LOS: 5 days    Time spent: over 30 min    Fayrene Helper, MD [facility]   To  contact the attending provider between 7A-7P or the covering provider during after hours 7P-7A, please log into the web site [email or web address] and access using universal [place] password for that web site. If you do not have the password, please call the hospital operator.  09/26/2022, 3:24 PM

## 2022-09-26 NOTE — Progress Notes (Addendum)
Rounding Note    Patient Name: Jasmine Keith Date of Encounter: 09/26/2022  [place] Cardiologist: Jenean Lindau, MD   Subjective   Laying in bed.  Still somewhat uncomfortable.  Blood pressure has been challenging to control.  Inpatient Medications    Scheduled Meds:  amLODipine  10 mg Oral Daily   busPIRone  7.5 mg Oral BID   feeding supplement  237 mL Oral TID BM   fenofibrate  160 mg Oral Daily   heparin  5,000 Units Subcutaneous Q8H   hydrALAZINE  100 mg Oral TID   insulin aspart  0-5 Units Subcutaneous QHS   insulin aspart  0-9 Units Subcutaneous TID WC   levothyroxine  25 mcg Oral Q0600   lidocaine  1 patch Transdermal Q24H   potassium chloride  40 mEq Oral Once   sacubitril-valsartan  1 tablet Oral BID   venlafaxine XR  75 mg Oral Q breakfast   Continuous Infusions:  magnesium sulfate bolus IVPB     PRN Meds: dicyclomine, hydrALAZINE, labetalol, meclizine, ondansetron, mouth rinse, oxyCODONE, traZODone   Vital Signs    Vitals:   09/25/22 2055 09/26/22 0030 09/26/22 0435 09/26/22 0837  BP: (!) 171/76 (!) 167/70 (!) 159/75 (!) 187/75  Pulse: 87 96 97 82  Resp: '16 16 16 16  '$ Temp: 97.9 F (36.6 C) 98 F (36.7 C) 97.9 F (36.6 C) 98 F (36.7 C)  TempSrc: Oral Oral Oral Oral  SpO2: 97% 96% 98% 98%  Weight:   49.5 kg   Height:        Intake/Output Summary (Last 24 hours) at 09/26/2022 0957 Last data filed at 09/26/2022 8768 Gross per 24 hour  Intake 240 ml  Output 500 ml  Net -260 ml      09/26/2022    4:35 AM 09/25/2022    5:00 AM 09/24/2022    4:28 AM  Last 3 Weights  Weight (lbs) 109 lb 2 oz 111 lb 1.8 oz 118 lb 2.7 oz  Weight (kg) 49.5 kg 50.4 kg 53.6 kg      Telemetry    Sinus rhythm- Personally Reviewed  ECG    No new- Personally Reviewed  Physical Exam   GEN: No acute distress.   Neck: No JVD Cardiac: RRR, no murmurs, rubs, or gallops.  Chest wound clean dry and intact post CABG late  October. Respiratory: Clear to auscultation bilaterally. GI: Soft, nontender, non-distended  MS: No edema; No deformity. Neuro:  Nonfocal  Psych: Normal affect   Labs    High Sensitivity Troponin:   Recent Labs  Lab 09/21/22 1315 09/21/22 1600 09/23/22 1503  TROPONINIHS 36* 33* 34*     Chemistry Recent Labs  Lab 09/22/22 0842 09/23/22 0131 09/24/22 0752 09/25/22 0229 09/26/22 0139  NA 140 140 139 141 139  K 4.6 4.6 3.9 4.0 3.6  CL 110 111 103 108 104  CO2 17* 18* 21* 22 24  GLUCOSE 209* 157* 173* 178* 133*  BUN 64* 54* 32* 23 17  CREATININE 2.09* 1.65* 1.24* 1.03* 1.03*  CALCIUM 8.4* 8.7* 9.1 8.9 9.0  MG 1.3* 2.4  --   --  1.4*  PROT  --  5.5* 6.1* 5.5* 5.8*  ALBUMIN  --  2.3* 2.4* 2.2* 2.4*  AST  --  11* '19 17 18  '$ ALT  --  '14 15 12 14  '$ ALKPHOS  --  72 86 70 73  BILITOT  --  0.2* 0.8 0.3 0.6  GFRNONAA 25*  33* 47* 59* 59*  ANIONGAP '13 11 15 11 11    '$ Lipids No results for input(s): "CHOL", "TRIG", "HDL", "LABVLDL", "LDLCALC", "CHOLHDL" in the last 168 hours.  Hematology Recent Labs  Lab 09/24/22 0752 09/25/22 0229 09/26/22 0139  WBC 8.1 7.1 6.4  RBC 4.17 4.05 4.14  HGB 10.9* 10.9* 10.9*  HCT 33.7* 32.8* 33.7*  MCV 80.8 81.0 81.4  MCH 26.1 26.9 26.3  MCHC 32.3 33.2 32.3  RDW 15.5 15.4 15.2  PLT 599* 515* 516*   Thyroid  Recent Labs  Lab 09/21/22 1348  TSH 3.057  FREET4 1.19*    BNP Recent Labs  Lab 09/21/22 1315  BNP 2,564.7*    DDimer No results for input(s): "DDIMER" in the last 168 hours.   Radiology    No results found.  Cardiac Studies   Echocardiogram 09/22/2022-EF greater than 75%  Patient Profile     71 y.o. 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    Assessment &  Plan    Symptomatic bradycardia while on coreg 25 mg twice a day.  This has been discontinued., K+ 5.2 and AKI.  Now resolved.  In fact, her heart rate is now in the 90s/low 100s.  I will add a lower dose of carvedilol back at 6.25 mg twice a day which will help with her heart rate as well as her blood pressure.  Watch for any signs of bradycardia.   Anemia down to 7.0 and transfused 1 unit PRBCs --today Hgb 10.9, stable from yesterday   AKI with cr up from 1.3 to 2.25 on admit now at 1.03  after IV fluids.  Stable from yesterday.   Recent CABG X 3 09/04/22  --TTE stable with normal EF reassuring.  Continue with goal-directed medical therapy including daily aspirin.   HTN  --was difficult to control post op --now on amlodipine 10 mg daily, hydralazine 100 mg TID -- Restarted Entresto at low-dose 24/26 on 09/25/2022.  She had previously been on moderate dose 49/51.  She tolerated this lower dose yesterday, no increase significant in creatinine.  1.03.  Lets go ahead and raise the Entresto back to her moderate dose 49/51.  Remember we were being careful secondary to AKI on admission.     DM/hypothyroidism/COPD/acute metabolic encephalopathy per IM  Mobilize   For questions or updates, please contact [place] Please consult [email or web address] for contact info under        Signed, Candee Furbish, MD  09/26/2022, 9:57 AM

## 2022-09-26 NOTE — Progress Notes (Addendum)
[address]       [place] [address]             [phone]      Subjective: S/P CABG x 3 by Dr. Kipp Brood on 10/30. Her postoperative course was complicated by difficult blood pressure control and delirium. She was discharged on 09/17/22 in stable condition.   Readmitted on 11/16 due to bradycardia, hypotension and delirium.   Today she states she feels better but has some lower back pain and leg aching. Husband states she has not left the bed other than to stand for the first time since previous discharge yesterday.   Objective: Vital signs in last 24 hours: Temp:  [97.9 F (36.6 C)-98 F (36.7 C)] 98 F (36.7 C) (11/21 0837) Pulse Rate:  [77-97] 82 (11/21 0837) Cardiac Rhythm: Normal sinus rhythm (11/21 0700) Resp:  [16] 16 (11/21 0837) BP: (149-187)/(64-76) 187/75 (11/21 0837) SpO2:  [96 %-98 %] 98 % (11/21 0837) Weight:  [49.5 kg] 49.5 kg (11/21 0435)  Hemodynamic parameters for last 24 hours:    Intake/Output from previous day: 11/20 0701 - 11/21 0700 In: 240 [P.O.:240] Out: 500 [Urine:500] Intake/Output this shift: No intake/output data recorded.  General appearance: alert, cooperative, and no distress Neurologic: intact Heart: regular rate and rhythm, S1, S2 normal, no murmur, click, rub or gallop Lungs: clear to auscultation bilaterally Abdomen: soft, non-tender; bowel sounds normal; no masses,  no organomegaly Extremities: extremities normal, atraumatic, no cyanosis or edema Wound: Clean, dry, intact  Lab Results: Recent Labs    09/25/22 0229 09/26/22 0139  WBC 7.1 6.4  HGB 10.9* 10.9*  HCT 32.8* 33.7*  PLT 515* 516*   BMET:  Recent Labs    09/25/22 0229 09/26/22 0139  NA 141 139  K 4.0 3.6  CL 108 104  CO2 22 24  GLUCOSE 178* 133*  BUN 23 17  CREATININE 1.03* 1.03*  CALCIUM 8.9 9.0    PT/INR: No results for input(s): "LABPROT", "INR" in the last 72 hours. ABG    Component Value Date/Time   PHART 7.503 (H)  09/07/2022 1256   HCO3 19.7 (L) 09/07/2022 1256   TCO2 14 (L) 09/21/2022 1329   ACIDBASEDEF 3.0 (H) 09/07/2022 1256   O2SAT 93 09/07/2022 1256   CBG (last 3)  Recent Labs    09/25/22 2053 09/26/22 0835 09/26/22 1222  GLUCAP 184* 167* 251*    Assessment/Plan:  S/P CABG x 3 on 10/30: TTE on 11/17 is stable, LVEF >75%. Her wounds are clean, healing well without signs of infection. Saturating well on room air. CXR showed some atelectasis but no pleural effusion or edema. No edema on exam. H/H stable after 1U of PRBCs.  She is no longer delirious. In NSR with stable rate. Continues  to have an elevated blood pressure. Overall stable from a surgical standpoint. Plan to continue medical management. Will plan to see her as needed while in the hospital. Will plan outpatient follow up.   HTN: Difficult to manage postoperatively, consulted cardiology during original hospital stay for assistance. She was discharged on Norvasc '10mg'$  QD, Coreg '25mg'$  BID, Clonidine 0.'1mg'$  BID, Hydralazine '100mg'$  TID and Entresto 49-'51mg'$  BID with a stable BP in the 130s. BP continues to be difficult to control. Will defer blood pressure management to cardiology service.  AKI: Improving, managed by medicine service  DM: Sugars not well controlled. Management by medicine service.  Deconditioning/dispo: Stable from a surgical standpoint but is not ambulating at  home and is deconditioned. Husband states he is struggling to take care of her at home. PT/OT recommended SNF. This may be a better option for her recovery.    LOS: 5 days    Magdalene [place], PA-C 09/26/2022

## 2022-09-26 NOTE — Progress Notes (Addendum)
RE: Jasmine Keith  Date of Birth: 23-Mar-1951  Date: 09/26/2022  To Whom It May Concern:  Please be advised that the above-named patient will require a short-term nursing home stay - anticipated 30 days or less for rehabilitation and strengthening. The plan is for return home.

## 2022-09-26 NOTE — TOC Progression Note (Addendum)
Transition of Care [facility]) - Progression Note    Patient Details  Name: Jasmine Keith MRN: [id number] Date of Birth: 01-01-1951  Transition of Care [facility]) CM/SW [place], [place] Phone Number: 09/26/2022, 1:58 PM  Clinical Narrative:     CSW spoke with patient and patients spouse Thayer Jew at bedside. CSW provided SNF bed offers to patient. Patient chose SNF placement at [facility]. CSW called Olivia Mackie with [facility] who confirmed SNF bed for patient. Patients passr is pending. CSW submitted clinicals requested to [place] must for review.CSW will continue to follow and assist with patients dc planning needs.   Expected Discharge Plan: [place] Barriers to Discharge: Continued Medical Work up  Expected Discharge Plan and Services Expected Discharge Plan: [place] In-house Referral: Clinical Social Work Discharge Planning Services: CM Consult Post Acute Care Choice: [place] arrangements for the past 2 months: Single Family Home                 DME Arranged: N/A DME Agency: NA       HH Arranged: RN, Disease Management, PT, OT HH Agency: [place] Date [place]: 09/22/22 Time HH Agency Contacted: 1000 Representative spoke with at [place]: [place] Determinants of Health ([place]) Interventions    Readmission Risk Interventions    09/22/2022   10:34 AM 09/06/2022   12:29 PM  Readmission Risk Prevention Plan  Transportation Screening Complete Complete  Medication Review (RN Care Manager) Referral to Pharmacy Referral to Pharmacy  [place] or Home Care Consult Complete Complete  SW Recovery Care/Counseling Consult Complete Complete  Palliative Care Screening Not Applicable Not [place] Not Applicable Complete

## 2022-09-27 ENCOUNTER — Telehealth (HOSPITAL_COMMUNITY): Payer: Self-pay

## 2022-09-27 DIAGNOSIS — M329 Systemic lupus erythematosus, unspecified: Secondary | ICD-10-CM | POA: Insufficient documentation

## 2022-09-27 DIAGNOSIS — F32A Depression, unspecified: Secondary | ICD-10-CM | POA: Insufficient documentation

## 2022-09-27 DIAGNOSIS — R001 Bradycardia, unspecified: Secondary | ICD-10-CM | POA: Diagnosis not present

## 2022-09-27 LAB — CBC WITH DIFFERENTIAL/PLATELET
Abs Immature Granulocytes: 0.03 10*3/uL (ref 0.00–0.07)
Basophils Absolute: 0.1 10*3/uL (ref 0.0–0.1)
Basophils Relative: 1 %
Eosinophils Absolute: 0.3 10*3/uL (ref 0.0–0.5)
Eosinophils Relative: 4 %
HCT: 38.7 % (ref 36.0–46.0)
Hemoglobin: 12.5 g/dL (ref 12.0–15.0)
Immature Granulocytes: 0 %
Lymphocytes Relative: 16 %
Lymphs Abs: 1.2 10*3/uL (ref 0.7–4.0)
MCH: 26.5 pg (ref 26.0–34.0)
MCHC: 32.3 g/dL (ref 30.0–36.0)
MCV: 82.2 fL (ref 80.0–100.0)
Monocytes Absolute: 0.4 10*3/uL (ref 0.1–1.0)
Monocytes Relative: 5 %
Neutro Abs: 5.5 10*3/uL (ref 1.7–7.7)
Neutrophils Relative %: 74 %
Platelets: 384 10*3/uL (ref 150–400)
RBC: 4.71 MIL/uL (ref 3.87–5.11)
RDW: 15.3 % (ref 11.5–15.5)
WBC: 7.5 10*3/uL (ref 4.0–10.5)
nRBC: 0 % (ref 0.0–0.2)

## 2022-09-27 LAB — COMPREHENSIVE METABOLIC PANEL
ALT: 13 U/L (ref 0–44)
AST: 16 U/L (ref 15–41)
Albumin: 2.5 g/dL — ABNORMAL LOW (ref 3.5–5.0)
Alkaline Phosphatase: 65 U/L (ref 38–126)
Anion gap: 13 (ref 5–15)
BUN: 17 mg/dL (ref 8–23)
CO2: 22 mmol/L (ref 22–32)
Calcium: 8.9 mg/dL (ref 8.9–10.3)
Chloride: 103 mmol/L (ref 98–111)
Creatinine, Ser: 0.94 mg/dL (ref 0.44–1.00)
GFR, Estimated: >60 mL/min (ref >60)
Glucose, Bld: 169 mg/dL — ABNORMAL HIGH (ref 70–99)
Potassium: 3.8 mmol/L (ref 3.5–5.1)
Sodium: 138 mmol/L (ref 135–145)
Total Bilirubin: 0.6 mg/dL (ref 0.3–1.2)
Total Protein: 5.9 g/dL — ABNORMAL LOW (ref 6.5–8.1)

## 2022-09-27 LAB — GLUCOSE, CAPILLARY
Glucose-Capillary: 146 mg/dL — ABNORMAL HIGH (ref 70–99)
Glucose-Capillary: 299 mg/dL — ABNORMAL HIGH (ref 70–99)
Glucose-Capillary: 225 mg/dL — ABNORMAL HIGH (ref 70–99)

## 2022-09-27 LAB — AMMONIA: Ammonia: 32 umol/L (ref 9–35)

## 2022-09-27 LAB — PHOSPHORUS: Phosphorus: 2.7 mg/dL (ref 2.5–4.6)

## 2022-09-27 LAB — MAGNESIUM: Magnesium: 2.1 mg/dL (ref 1.7–2.4)

## 2022-09-27 MED ORDER — INSULIN ASPART 100 UNIT/ML IJ SOLN: 0.0000 [IU] | Freq: Three times a day (TID) | INTRAMUSCULAR | 11 refills | Status: DC

## 2022-09-27 MED ORDER — HYDRALAZINE HCL 100 MG PO TABS: 100.0000 mg | ORAL_TABLET | Freq: Three times a day (TID) | ORAL | 1 refills | Status: DC

## 2022-09-27 MED ORDER — CARVEDILOL 6.25 MG PO TABS: 6.2500 mg | ORAL_TABLET | Freq: Two times a day (BID) | ORAL | 1 refills | Status: DC

## 2022-09-27 MED ORDER — OXYCODONE HCL 5 MG PO TABS: 5.0000 mg | ORAL_TABLET | Freq: Four times a day (QID) | ORAL | 0 refills | Status: AC | PRN

## 2022-09-27 MED ORDER — INSULIN DETEMIR 100 UNIT/ML ~~LOC~~ SOLN: 7.0000 [IU] | Freq: Every day | SUBCUTANEOUS | 11 refills | Status: DC

## 2022-09-27 NOTE — Telephone Encounter (Signed)
Per phase I cardiac rehab, fax referral to [facility] cardiac rehab.

## 2022-09-27 NOTE — TOC Progression Note (Addendum)
Transition of Care [facility]) - Progression Note    Patient Details  Name: Jasmine Keith MRN: [id number] Date of Birth: Sep 13, 1951  Transition of Care [facility]) CM/SW [place], [place] Phone Number: 09/27/2022, 12:36 PM  Clinical Narrative:     CSW spoke with [facility] [place] who confirmed patient can dc on Friday if medically ready. Unable to accept patient tomorrow. Facility informed CSW pharmacy will be closed. CSW informed MD. Patients spouse agreeable for PTAR transport for patient when patient ready for dc.CSW will continue to follow and assist with patients dc planning needs.   Expected Discharge Plan: [place] Barriers to Discharge: Continued Medical Work up  Expected Discharge Plan and Services Expected Discharge Plan: [place] In-house Referral: Clinical Social Work Discharge Planning Services: CM Consult Post Acute Care Choice: [place] arrangements for the past 2 months: Single Family Home                 DME Arranged: N/A DME Agency: NA       HH Arranged: RN, Disease Management, PT, OT HH Agency: [place] Date [place]: 09/22/22 Time HH Agency Contacted: 1000 Representative spoke with at [place]: [place] Determinants of Health ([place]) Interventions    Readmission Risk Interventions    09/22/2022   10:34 AM 09/06/2022   12:29 PM  Readmission Risk Prevention Plan  Transportation Screening Complete Complete  Medication Review (RN Care Manager) Referral to Pharmacy Referral to Pharmacy  [place] or Home Care Consult Complete Complete  SW Recovery Care/Counseling Consult Complete Complete  Palliative Care Screening Not Applicable Not [place] Not Applicable Complete

## 2022-09-27 NOTE — TOC Transition Note (Signed)
Transition of Care [facility]) - CM/SW Discharge Note   Patient Details  Name: Jasmine Keith MRN: [id number] Date of Birth: 1951/10/21  Transition of Care [facility]) CM/SW Contact:  Milas Gain, [place] Phone Number: 09/27/2022, 2:50 PM   Clinical Narrative:     Patient will DC to: [facility] [place]  Anticipated DC date: 09/27/2022  Family notified: Thayer Jew  Transport by: Corey Harold  ?  Per MD patient ready for DC to [facility] [place] . RN, patient, patient's family, and facility notified of DC. Discharge Summary sent to facility. RN given number for report tele# [phone] [phone]. DC packet on chart. Ambulance transport requested for patient.  CSW signing off.   Final next level of care: Skilled Nursing Facility Barriers to Discharge: No Barriers Identified   Patient Goals and CMS Choice Patient states their goals for this hospitalization and ongoing recovery are:: SNF CMS Medicare.gov Compare Post Acute Care list provided to:: Patient Choice offered to / list presented to : Patient  Discharge Placement              Patient chooses bed at: [facility], [place] Patient to be transferred to facility by: [place] Name of family member notified: Thayer Jew Patient and family notified of of transfer: 09/27/22  Discharge Plan and Services In-house Referral: Clinical Social Work Discharge Planning Services: [employer] Consult Post Acute Care Choice: Home Health          DME Arranged: N/A DME Agency: NA       HH Arranged: RN, Disease Management, PT, OT HH Agency: [place] Date [place]: 09/22/22 Time HH Agency Contacted: 1000 Representative spoke with at [place]: [place] Determinants of Health ([place]) Interventions     Readmission Risk Interventions    09/22/2022   10:34 AM 09/06/2022   12:29 PM  Readmission Risk Prevention Plan  Transportation Screening Complete Complete  Medication Review Press photographer) Referral to Pharmacy Referral to  Pharmacy  [place] or Home Care Consult Complete Complete  SW Recovery Care/Counseling Consult Complete Complete  Palliative Care Screening Not Applicable Not [place] Not Applicable Complete

## 2022-09-27 NOTE — Progress Notes (Signed)
Physical Therapy Treatment Patient Details Name: Jasmine Keith MRN: [id number] DOB: 03-10-1951 Today's Date: 09/27/2022   History of Present Illness 71 y.o. female who presented to the ED on 09/21/2022 by EMS with hypotension (60/palpable), bradycardia (30's bpm), and confusion in the setting of poor oral intake since discharge 11/12 In ED found to have shock due to hypovolemia and bradycardia, admitted to ICU ultimately hemodynamically stabilizing and transferring to the progressive care unit 11/18. 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  However, did have normal BP and sats, HR while verbalizing dizziness and nausea.  Reported to MD for follow up as well as pt not demonstrating any nystagmus with any movements.   Supine BP 152/55, pulse 77, sat 97%;  sitting 136/59, sat 97% and pulse 77;  standing was 130/89, pulse 81 and sat 99% map 95.  Follow up with her to move as tolerated, may need vertebral artery check for further explanation of symptoms, but does have very chronic dizzy issues without typical vestibular presentation.  Recommendations for follow up therapy are one component of a multi-disciplinary discharge planning process, led by the attending physician.  Recommendations may be updated based on patient status, additional functional criteria and insurance authorization.  Follow Up Recommendations  Skilled nursing-short term rehab (<3 hours/day) Can patient physically be transported by private vehicle: Yes   Assistance Recommended at Discharge Frequent or constant Supervision/Assistance  Patient can return home with the following A lot of help with walking and/or transfers;A lot of help with bathing/dressing/bathroom;Assistance with cooking/housework;Direct supervision/assist for medications management;Direct supervision/assist for financial management;Assist  for transportation;Help with stairs or ramp for entrance   Equipment Recommendations  None recommended by PT    Recommendations for Other Services       Precautions / Restrictions Precautions Precautions: Fall;Sternal Precaution Booklet Issued: No Precaution Comments: reinforced precautions Restrictions Weight Bearing Restrictions: No Other Position/Activity Restrictions: sternal precautions     Mobility  Bed Mobility Overal bed mobility: Needs Assistance Bed Mobility: Sidelying to Sit, Sit to Sidelying Rolling: Min assist Sidelying to sit: Min assist Supine to sit: Min assist Sit to supine: Min assist Sit to sidelying: Min assist General bed mobility comments: pt has dizziness come on as soon as she sits on side of bed, no saccades observed    Transfers Overall transfer level: Needs assistance Equipment used: 2 person hand held assist Transfers: Sit to/from Stand Sit to Stand: Min assist, +2 physical assistance, +2 safety/equipment           General transfer comment: to test BP    Ambulation/Gait               General Gait Details: deferred due to nausea with dizziness by pt   Stairs             Wheelchair Mobility    Modified Rankin (Stroke Patients Only)       Balance Overall balance assessment: Needs assistance Sitting-balance support: Feet supported Sitting balance-Leahy Scale: Fair     Standing balance support: Bilateral upper extremity supported, During functional activity Standing balance-Leahy Scale: Poor                              Cognition Arousal/Alertness: Lethargic Behavior During Therapy: Flat affect Overall Cognitive Status: Within Functional Limits for tasks  assessed                                 General Comments: husband is mostly involved with conversation        Exercises      General Comments General comments (skin integrity, edema, etc.): pt was assisted to stand from the  bedside, noted not terribly low BP and stable sats and HR.  No nystagmus was observed with movements      Pertinent Vitals/Pain Pain Assessment Pain Assessment: No/denies pain    Home Living                          Prior Function            PT Goals (current goals can now be found in the care plan section) Acute Rehab PT Goals Patient Stated Goal: feel better Progress towards PT goals: Progressing toward goals    Frequency    Min 3X/week      PT Plan Current plan remains appropriate    Co-evaluation              AM-PAC PT "6 Clicks" Mobility   Outcome Measure  Help needed turning from your back to your side while in a flat bed without using bedrails?: A Little Help needed moving from lying on your back to sitting on the side of a flat bed without using bedrails?: A Little Help needed moving to and from a bed to a chair (including a wheelchair)?: A Little Help needed standing up from a chair using your arms (e.g., wheelchair or bedside chair)?: A Little Help needed to walk in hospital room?: A Lot Help needed climbing 3-5 steps with a railing? : Total 6 Click Score: 15    End of Session Equipment Utilized During Treatment: Gait belt Activity Tolerance: Treatment limited secondary to medical complications (Comment) (dizziness and nausea) Patient left: in bed;with call bell/phone within reach;with bed alarm set;with family/visitor present Nurse Communication: Mobility status;Other (comment) PT Visit Diagnosis: Unsteadiness on feet (R26.81);Other abnormalities of gait and mobility (R26.89);Muscle weakness (generalized) (M62.81);Difficulty in walking, not elsewhere classified (R26.2);BPPV;Dizziness and giddiness (R42)     Time: 2836-6294 PT Time Calculation (min) (ACUTE ONLY): 15 min  Charges:  $Therapeutic Activity: 8-22 mins          Ramond Dial 09/27/2022, 4:30 PM  Mee Hives, PT PhD Acute Rehab Dept. Number: [place] and [place]

## 2022-09-27 NOTE — Discharge Summary (Signed)
Physician Discharge Summary  Jasmine Keith HYI:[id number] DOB: 10-17-1951 DOA: 09/21/2022  PCP: Mackie Pai, PA-C  Admit date: 09/21/2022 Discharge date: 09/27/2022  Time spent: 40 minutes  Recommendations for Outpatient Follow-up:  Follow outpatient CBC/CMP  Follow blood pressure with cardiology and PCP outpatient, adjust regimen outpatient as tolerated Her basal insulin reduced to 7 units at discharge with SSI, follow and adjust as needed going forward (was using 15 units of levemir prior to this admission) Follow anemia outpatient Follow vertigo outpatient, if meclizine not helping, may need additional evaluation/consideration of other possible causes Repeat thyroid function tests outpatient in [place] few weeks Virtual appt with CT surgery on Dec 1  Discharge Diagnoses:  Principal Problem:   Bradycardia Active Problems:   Adverse reaction to beta-blocker   Discharge Condition: stable  Diet recommendation: heart healthy, diabetic  Filed Weights   09/25/22 0500 09/26/22 0435 09/27/22 0500  Weight: 50.4 kg 49.5 kg 49.3 kg    History of present illness:  Jasmine Keith is [name] 71 y.o. 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 She had an episode of hypoglycemia at home due to poor oral intake, and had otherwise been taking medications (administered by husband), including clonidine and coreg as prescribed at discharge.    She had been discharged 11/12 after CABG with postoperative course complicated by hypertension improved with medication adjustments. At home she had ongoing diarrhea.   In the ED she was found to have shock due to hypovolemia and bradycardia, admitted to ICU ultimately hemodynamically stabilizing and transferring to the progressive  care unit 11/18.    Significant events 11/16 - BIB EMS for AMS, bradycardia, hypotension. Epi gtt. Later given glucagon bolus and gtt.  11/17 off epi  11/18 picked up by [facility] for discharge on 11/22. Discharged with plan for outpatient follow up.  Hospital Course:  Assessment and Plan: Sinus bradycardia due to beta blocker: Improved s/p glucagon, off pressors. - resume coreg per cards - continue to hold clonidine   CAD s/p CABG, HTN:  - Echo showed no pericardial effusion, ECG repeated shows no acutely ischemic features, troponin flat, max is 36. Cardiology consult appreciated - Continue HTN management. Increased hydralazine per cardiology.  Entresto restarted.  Cardiology has resumed low dose coreg.   COPD:  - Incentive spirometry at bedside   Diarrhea: No abdominal pain/tenderness, no leukocytosis. Continue to replete GI losses and monitor.  - GI note by Dr. Lyndel Safe reviewed 07/18/2022 recommend bentyl AC/HS prn, which we have ordered as TID prn. Also notes to avoid magnesium supplementation if recurrent diarrhea (was resolved at that time).  - Caution with PPI in setting of previous C. diff.    Acute metabolic encephalopathy, anxiety: Initially due to shock, also having reversal of day-night cycles still.  - Restart home effexor qAM, and buspar.  - Check ammonia (resolved). LFTs wnl.  - delirium precautions   T2DM: HbA1c 6.9% recently.  - Avoid hypoglycemia, stopped home levemir 15u  - as she had hypoglycemia prior to admission related to poor PO intake, will restart at 1/2 her home basal dose, 7 units levemir daily.  Will have SSI as well.  Resume metformin.   Dyspnea: Improved with transfusion, not resolved. Pt agrees anxiety could be playing Maryanna Stuber role. CXR this AM showing  only streaky atelectasis, no infiltrate or airspace or interstitial edema or effusions.  - Incentive spirometry.  - Monitor SpO2.    Symptomatic anemia of critical illness: No bleeding noted, hgb trending  slowly downward.  - s/p 1u PRBC 11/18, hgb up as anticipated.  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   AKI on stage IIIa CKD: Continues to improve.  - will resume low dose entresto, follow renal function - Monitor daily.   Hypothyroidism: TSH wnl at 3.057, free T4 checked on admission to ICU was 1.19 (ULN 1.12) - Recheck TFT outpatient  - Continue low dose synthroid   Lactic acidosis: Improved.    Mixed shock due to hypovolemia and medication-induced bradycardia: Resolved.   Hyperkalemia: Resolved   Chronic Vertigo: per therapy discussion with patient, she notes longstanding hx BPPV, taking meclizine as needed Will follow orthostatics - negative Follow outpatient, if persistent, she'd probably benefit from additional workup/vestibular rehab   She needs PT/OT given general deconditioning.  Plan for discharge to SNF level of care.     Procedures: Echo Echo IMPRESSIONS     1. Left ventricular ejection fraction, by estimation, is >75%. The left  ventricle has hyperdynamic function. The left ventricle has no regional  wall motion abnormalities.   2. Right ventricular systolic function is normal. The right ventricular  size is normal. There is normal pulmonary artery systolic pressure. The  estimated right ventricular systolic pressure is 98.1 mmHg.   3. The mitral valve is grossly normal. Trivial mitral valve  regurgitation. No evidence of mitral stenosis.   4. The inferior vena cava is normal in size with <50% respiratory  variability, suggesting right atrial pressure of 8 mmHg.   Comparison(s): No significant change from prior study.      Consultations: Cardiology CT surgery  Discharge Exam: Vitals:   09/27/22 0500 09/27/22 0936  BP: (!) 170/75 (!) 150/85  Pulse: 85 92  Resp: 17 17  Temp: 97.8 F (36.6 C) 98 F (36.7 C)  SpO2: 97% 98%   Feeling anxious Her dizziness feels like the  chronic vertigo she's had  General: No acute distress. Cardiovascular: RRR Lungs: unlabored Abdomen: Soft, nontender, nondistended  Neurological: Alert and oriented 3. Moves all extremities 4 with equal strength. Cranial nerves II through XII grossly intact. Extremities: No clubbing or cyanosis. No edema.   Discharge Instructions   Discharge Instructions     Call MD for:  difficulty breathing, headache or visual disturbances   Complete by: As directed    Call MD for:  extreme fatigue   Complete by: As directed    Call MD for:  hives   Complete by: As directed    Call MD for:  persistant dizziness or light-headedness   Complete by: As directed    Call MD for:  persistant nausea and vomiting   Complete by: As directed    Call MD for:  redness, tenderness, or signs of infection (pain, swelling, redness, odor or green/yellow discharge around incision site)   Complete by: As directed    Call MD for:  severe uncontrolled pain   Complete by: As directed    Call MD for:  temperature >100.4   Complete by: As directed    Diet - low sodium heart healthy   Complete by: As directed    Discharge instructions   Complete by: As directed    You were seen for low blood pressure and  slow heart rate and low blood sugars.    We think this was related to your medications.  We've made some changes to your medications and have adjusted your medications.  Please continue the medications as currently prescribed.  Your cardiologist and PCP and surgeon will continue to adjust things going forward.  We'll discharge you on about half the insulin you were using before your admission.  They'll follow your blood sugars outpatient and can adjust back to the 15 units as tolerated.  We'll send you on some sliding scale insulin to use at the skilled nursing facility as well.  You had some dizziness here that we think was related to your chronic vertigo.  Continue to use your meclizine as needed to help treat  the vertigo to help you mobilize with therapy.  If this is persistent, you'll need to follow up outpatient for additional workup, but my hope is that continued work with therapy will help these symptoms improve.  Return for new, recurrent, or worsening symptoms.  Please ask your PCP to request records from this hospitalization so they know what was done and what the next steps will be.   Increase activity slowly   Complete by: As directed       Allergies as of 09/27/2022       Reactions   Lipitor [atorvastatin]    myalgias   Lovastatin    myalgias   Rosuvastatin    myalgia        Medication List     STOP taking these medications    cloNIDine 0.1 MG tablet Commonly known as: CATAPRES       TAKE these medications    acetaminophen 325 MG tablet Commonly known as: TYLENOL Take 2 tablets (650 mg total) by mouth every 6 (six) hours as needed for mild pain (or Fever >/= 101).   alendronate 70 MG tablet Commonly known as: FOSAMAX TAKE 1 TABLET BY MOUTH ONCE Jessicaann Overbaugh WEEK. TAKE WITH Janeth Terry FULL GLASS OF WATER ON AN EMPTY STOMACH.   amLODipine 10 MG tablet Commonly known as: NORVASC TAKE 1 TABLET BY MOUTH EVERY DAY   aspirin EC 325 MG tablet Take 1 tablet (325 mg total) by mouth daily.   BD Pen Needle Nano U/F 32G X 4 MM Misc Generic drug: Insulin Pen Needle Use one needle daily to inject insulin   busPIRone 7.5 MG tablet Commonly known as: BUSPAR TAKE 1 TABLET BY MOUTH 2 TIMES DAILY. What changed: how much to take   carvedilol 6.25 MG tablet Commonly known as: COREG Take 1 tablet (6.25 mg total) by mouth 2 (two) times daily with Nandan Willems meal. What changed:  medication strength how much to take   dicyclomine 10 MG capsule Commonly known as: BENTYL Take 10 mg by mouth 4 (four) times daily -  before meals and at bedtime.   fenofibrate 160 MG tablet TAKE 1 TABLET BY MOUTH EVERY DAY   gabapentin 300 MG capsule Commonly known as: NEURONTIN Take 1 capsule (300 mg total) by  mouth 2 (two) times daily.   hydrALAZINE 100 MG tablet Commonly known as: APRESOLINE Take 1 tablet (100 mg total) by mouth 3 (three) times daily. What changed: Another medication with the same name was removed. Continue taking this medication, and follow the directions you see here.   insulin aspart 100 UNIT/ML injection Commonly known as: novoLOG Inject 0-9 Units into the skin 3 (three) times daily with meals. CBG < 70:treat for low blood sugar  CBG 70 -  120: 0 units  CBG 121 - 150: 1 unit  CBG 151 - 200: 2 units  CBG 201 - 250: 3 units  CBG 251 - 300: 5 units  CBG 301 - 350: 7 units  CBG 351 - 400: 9 units  CBG > 400: call MD   insulin detemir 100 UNIT/ML injection Commonly known as: LEVEMIR Inject 0.07 mLs (7 Units total) into the skin at bedtime. Use 7 units daily as you transition to SNF, they can adjust this at the facility as needed. What changed:  how much to take additional instructions   levothyroxine 25 MCG tablet Commonly known as: SYNTHROID TAKE 1 TABLET BY MOUTH EVERY DAY BEFORE BREAKFAST What changed: See the new instructions.   meclizine 25 MG tablet Commonly known as: ANTIVERT Take 1 tablet (25 mg total) by mouth 3 (three) times daily as needed for dizziness.   metFORMIN 1000 MG tablet Commonly known as: GLUCOPHAGE Take 1 tablet (1,000 mg total) by mouth 2 (two) times daily with Tayler Lassen meal.   oxyCODONE 5 MG immediate release tablet Commonly known as: Oxy IR/ROXICODONE Take 1 tablet (5 mg total) by mouth every 4 (four) hours as needed for severe pain.   pantoprazole 40 MG tablet Commonly known as: PROTONIX TAKE 1 TABLET BY MOUTH EVERY DAY   sacubitril-valsartan 49-51 MG Commonly known as: ENTRESTO Take 1 tablet by mouth 2 (two) times daily.   venlafaxine XR 75 MG 24 hr capsule Commonly known as: EFFEXOR-XR TAKE 1 CAPSULE BY MOUTH DAILY WITH BREAKFAST. What changed: when to take this   Vitamin D (Ergocalciferol) 1.25 MG (50000 UNIT) Caps  capsule Commonly known as: DRISDOL TAKE 1 CAPSULE BY MOUTH ONE TIME PER WEEK What changed: See the new instructions.       Allergies  Allergen Reactions   Lipitor [Atorvastatin]     myalgias   Lovastatin     myalgias   Rosuvastatin     myalgia    Contact information for follow-up providers     [facility] Follow up.   Why: Registered Nurse, Physical and Occupational Therapy-office to call with visit times. Contact information: [place] [address] [phone]         Lajuana Matte, MD Follow up.   Specialty: Cardiothoracic Surgery Why: Virtual appointment with Dr. Kipp Brood. Do NOT go to the office as this is Chiana Wamser VIRTUAL visit. Dr. Kipp Brood will call you on 12/01 at 2:50PM Contact information: [address] [place] [address] [phone]         Mackie Pai, PA-C Follow up.   Specialties: Internal Medicine, Family Medicine Contact information: [address] [place] [address] [phone]              Contact information for after-discharge care     Destination     [facility] .   Service: Skilled Nursing Contact information: [place] [phone]                      The results of significant diagnostics from this hospitalization (including imaging, microbiology, ancillary and laboratory) are listed below for reference.    Significant Diagnostic Studies: DG CHEST PORT 1 VIEW  Result Date: 09/24/2022 CLINICAL DATA:  Status post CABG EXAM: PORTABLE CHEST 1 VIEW COMPARISON:  September 22, 2022 FINDINGS: Sternotomy wires are intact. The heart size is unremarkable. The hila and mediastinum  are normal. The right lung is clear. Streaky opacity in the left mid lung may represent atelectasis. No other acute abnormalities. No overt edema. IMPRESSION: Streaky opacity in the left mid lung may  represent atelectasis. No other acute abnormalities. Electronically Signed   By: Dorise Bullion III M.D.   On: 09/24/2022 09:19   ECHOCARDIOGRAM LIMITED  Result Date: 09/22/2022    ECHOCARDIOGRAM LIMITED REPORT   Patient Name:   AURORE REDINGER Date of Exam: 09/22/2022 Medical Rec #:  [id number]          Height:       61.0 in Accession #:    [id number]         Weight:       124.1 lb Date of Birth:  1951-01-18         BSA:          1.542 m Patient Age:    89 years           BP:           141/37 mmHg Patient Gender: F                  HR:           52 bpm. Exam Location:  Inpatient Procedure: 2D Echo, Limited Color Doppler, Cardiac Doppler and Limited Echo Indications:    Abnormal ECG R94.31  History:        Patient has prior history of Echocardiogram examinations, most                 recent 09/04/2022. CAD, Prior CABG, Arrythmias:Bradycardia,                 Signs/Symptoms:Fatigue and Shortness of Breath; Risk                 Factors:Hypertension, Dyslipidemia, Diabetes and Current Smoker.  Sonographer:    Greer Pickerel Referring Phys: [id number] STEPHANIE M REESE  Sonographer Comments: Image acquisition challenging due to COPD and Image acquisition challenging due to respiratory motion. IMPRESSIONS  1. Left ventricular ejection fraction, by estimation, is >75%. The left ventricle has hyperdynamic function. The left ventricle has no regional wall motion abnormalities.  2. Right ventricular systolic function is normal. The right ventricular size is normal. There is normal pulmonary artery systolic pressure. The estimated right ventricular systolic pressure is 77.8 mmHg.  3. The mitral valve is grossly normal. Trivial mitral valve regurgitation. No evidence of mitral stenosis.  4. The inferior vena cava is normal in size with <50% respiratory variability, suggesting right atrial pressure of 8 mmHg. Comparison(s): No significant change from prior study. FINDINGS  Left Ventricle: Left ventricular ejection fraction,  by estimation, is >75%. The left ventricle has hyperdynamic function. The left ventricle has no regional wall motion abnormalities. The left ventricular internal cavity size was normal in size. There is no left ventricular hypertrophy. Right Ventricle: The right ventricular size is normal. No increase in right ventricular wall thickness. Right ventricular systolic function is normal. There is normal pulmonary artery systolic pressure. The tricuspid regurgitant velocity is 2.08 m/s, and  with an assumed right atrial pressure of 8 mmHg, the estimated right ventricular systolic pressure is 24.2 mmHg. Pericardium: There is no evidence of pericardial effusion. Mitral Valve: The mitral valve is grossly normal. Trivial mitral valve regurgitation. No evidence of mitral valve stenosis. Tricuspid Valve: The tricuspid valve is grossly normal. Tricuspid valve regurgitation is trivial. No evidence of tricuspid stenosis. Aorta: The aortic root is  normal in size and structure. Venous: The inferior vena cava is normal in size with less than 50% respiratory variability, suggesting right atrial pressure of 8 mmHg. Additional Comments: Spectral Doppler performed. Color Doppler performed.  LEFT VENTRICLE PLAX 2D LVIDd:         3.90 cm LVIDs:         2.50 cm LV PW:         1.20 cm LV IVS:        1.00 cm  LEFT ATRIUM         Index LA diam:    3.60 cm 2.33 cm/m  AORTIC VALVE LVOT Vmax:   143.00 cm/s LVOT Vmean:  89.100 cm/s LVOT VTI:    0.316 m MITRAL VALVE                TRICUSPID VALVE MV Area (PHT): 3.20 cm     TR Peak grad:   17.3 mmHg MV Decel Time: 237 msec     TR Vmax:        208.00 cm/s MV E velocity: 133.00 cm/s MV Zaniel Marineau velocity: 89.10 cm/s   SHUNTS MV E/Jamarques Pinedo ratio:  1.49         Systemic VTI: 0.32 m Eleonore Chiquito MD Electronically signed by Eleonore Chiquito MD Signature Date/Time: 09/22/2022/4:13:47 PM    Final    DG Chest Port 1 View  Result Date: 09/22/2022 CLINICAL DATA:  Shortness of breath.  Recent CABG EXAM: PORTABLE CHEST 1  VIEW COMPARISON:  Chest x-ray from yesterday FINDINGS: Stable cardiomegaly. Prior CABG. Atelectatic type streaky density at the left base is similar to prior. No visible effusion or pneumothorax. Borderline for vascular congestion. IMPRESSION: Stable postoperative chest with atelectasis asymmetric to the left. Electronically Signed   By: Jorje Guild M.D.   On: 09/22/2022 06:12   DG Chest Port 1 View  Result Date: 09/21/2022 CLINICAL DATA:  Chest pain. EXAM: PORTABLE CHEST 1 VIEW COMPARISON:  Chest x-ray September 12, 2022. FINDINGS: Similar small left pleural effusion with overlying left basilar opacities. No visible pneumothorax. Similar mild enlargement the cardiac silhouette. CABG and median sternotomy. IMPRESSION: Similar small left pleural effusion with overlying left basilar opacities. Electronically Signed   By: Margaretha Sheffield M.D.   On: 09/21/2022 13:49   DG CHEST PORT 1 VIEW  Result Date: 09/12/2022 CLINICAL DATA:  Short of breath EXAM: PORTABLE CHEST 1 VIEW COMPARISON:  Chest 09/08/2022 FINDINGS: Postop median sternotomy and CABG. Heart size upper normal. Negative for heart failure. Left lower lobe airspace disease and small left effusion unchanged. Right lung remains clear. IMPRESSION: Left lower lobe airspace disease and small left effusion unchanged. Electronically Signed   By: Franchot Gallo M.D.   On: 09/12/2022 15:48   DG CHEST PORT 1 VIEW  Result Date: 09/08/2022 CLINICAL DATA:  Weakness, pleural effusion hx CABG x 3 EXAM: PORTABLE CHEST - 1 VIEW COMPARISON:  09/07/2022 FINDINGS: Persistent consolidation left lung base. Airspace opacities throughout the right lung, marginally improved at the base compared to previous. Heart size upper limits normal.  CABG markers. Possible small left effusion. Sternotomy wires. IMPRESSION: 1. Persistent left lower lobe consolidation. 2. Marginal improvement in right lung airspace disease. Electronically Signed   By: Lucrezia Europe M.D.   On: 09/08/2022  08:45   DG Chest Port 1 View  Result Date: 09/07/2022 CLINICAL DATA:  Acute respiratory distress EXAM: PORTABLE CHEST 1 VIEW COMPARISON:  Yesterday FINDINGS: Hazy bilateral airspace disease. Congested appearance of vessels. Cardiomegaly and small pleural  effusions. No visible pneumothorax. Chest tubes and central line have been removed. IMPRESSION: CHF pattern.  No visible pneumothorax. Electronically Signed   By: Jorje Guild M.D.   On: 09/07/2022 06:35   DG Chest Port 1 View  Result Date: 09/06/2022 CLINICAL DATA:  Chest tube present s/p CABG, sore chest today EXAM: PORTABLE CHEST - 1 VIEW COMPARISON:  the previous day's study FINDINGS: Stable right IJ central line to the distal SVC. Mediastinal and left lower chest drains stable in position. No pneumothorax. Small left pleural effusion. Persistent consolidation/atelectasis in the left lower lung. Worsening patchy airspace opacities in the right mid and lower lung. Heart size and mediastinal contours are within normal limits. Post CABG. Aortic Atherosclerosis (ICD10-170.0). Sternotomy wires. IMPRESSION: 1. Worsening right mid and lower lung airspace disease. 2. Stable left lower lung consolidation/atelectasis and small effusion. Electronically Signed   By: Lucrezia Europe M.D.   On: 09/06/2022 08:24   DG Chest Port 1 View  Result Date: 09/05/2022 CLINICAL DATA:  Status post CABG EXAM: PORTABLE CHEST 1 VIEW COMPARISON:  Chest radiograph 09/04/2022 FINDINGS: Interval extubation and removal of the enteric tube. Right-sided central venous catheter and sheath remain. Mediastinal drain and left-sided thoracostomy tube are in place. Sternotomy wires are intact. Epicardial pacing leads in place. Compared to prior exam there is interval decrease in aeration of the left lung base. There is Sharniece Gibbon hazy opacity at the left lung base which could represent Merly Hinkson combination of Nisa Decaire layering pleural effusion and atelectasis, but superimposed infection is difficult to  radiographically exclude. There also prominent interstitial opacities which likely represent mild pulmonary edema, unchanged from prior exam. Visualized upper abdomen is unremarkable. No new displaced rib fractures. Degenerative changes at the bilateral [facility] and glenohumeral joints. IMPRESSION: 1. Interval extubation and removal of the enteric tube. 2. Increased hazy opacity at the left lung base which could represent Harout Scheurich combination of Rochele Lueck layering pleural effusion and atelectasis, but superimposed infection is difficult to radiographically exclude. 3. Stable mild pulmonary edema. Electronically Signed   By: Marin Roberts M.D.   On: 09/05/2022 08:04   ECHO INTRAOPERATIVE TEE  Result Date: 09/04/2022  *INTRAOPERATIVE TRANSESOPHAGEAL REPORT *  Patient Name:   BREANAH FADDIS Date of Exam: 09/04/2022 Medical Rec #:  [id number]          Height:       61.0 in Accession #:    [id number]         Weight:       109.3 lb Date of Birth:  1951-03-03         BSA:          1.46 m Patient Age:    40 years           BP:           13/59 mmHg Patient Gender: F                  HR:           72 bpm. Exam Location:  Anesthesiology Transesophogeal exam was perform intraoperatively during surgical procedure. Patient was closely monitored under general anesthesia during the entirety of examination. Indications:     Coronary Artery Disease Sonographer:     Bernadene Person RDCS Performing Phys: [id number] Lucile Crater LIGHTFOOT Diagnosing Phys: Hoy Morn MD Complications: No known complications during this procedure. POST-OP IMPRESSIONS _ Left Ventricle: has hyperdynamic systolic function. The cavity size was normal. The wall motion is normal. _ Right Ventricle: The right  ventricle appears unchanged from pre-bypass. _ Aorta: The aorta appears unchanged from pre-bypass. _ Left Atrial Appendage: The left atrial appendage appears unchanged from pre-bypass. _ Aortic Valve: The aortic valve appears unchanged from pre-bypass. _ Mitral Valve: There  is mild regurgitation. _ Tricuspid Valve: The tricuspid valve appears unchanged from pre-bypass. _ Pulmonic Valve: The pulmonic valve appears unchanged from pre-bypass. _ Interatrial Septum: The interatrial septum appears unchanged from pre-bypass. _ Pericardium: The pericardium appears unchanged from pre-bypass. _ Comments: Post-bypass images reviewed with surgeon. PRE-OP FINDINGS  Left Ventricle: The left ventricle has hyperdynamic systolic function, with an ejection fraction of >65%. The cavity size was normal. No evidence of left ventricular regional wall motion abnormalities. Concentric left ventricular hypertrophy. Right Ventricle: The right ventricle has normal systolic function. The cavity was normal. There is no increase in right ventricular wall thickness. Left Atrium: Left atrial size was normal in size. No left atrial/left atrial appendage thrombus was detected. Left atrial appendage velocity is reduced at less than 40 cm/s. Right Atrium: Right atrial size was normal in size. Interatrial Septum: No atrial level shunt detected by color flow Doppler. Pericardium: There is no evidence of pericardial effusion. Mitral Valve: The mitral valve is normal in structure. Mitral valve regurgitation is trivial by color flow Doppler. Tricuspid Valve: The tricuspid valve was normal in structure. Tricuspid valve regurgitation was not visualized by color flow Doppler. Aortic Valve: The aortic valve is tricuspid Aortic valve regurgitation was not visualized by color flow Doppler. Pulmonic Valve: The pulmonic valve was normal in structure. Pulmonic valve regurgitation is trivial by color flow Doppler. Aorta: The aortic root is normal in size and structure. There is evidence of plaque in the ascending aorta, aortic arch and descending aorta.  Hoy Morn MD Electronically signed by Hoy Morn MD Signature Date/Time: 09/04/2022/4:03:11 PM    Final    DG Chest 2 View  Result Date: 09/04/2022 CLINICAL DATA:  Preop  evaluation EXAM: CHEST - 2 VIEW COMPARISON:  None Available. FINDINGS: The heart size and mediastinal contours are within normal limits. Both lungs are clear. The visualized skeletal structures are unremarkable. IMPRESSION: No active cardiopulmonary disease. Electronically Signed   By: Elmer Picker M.D.   On: 09/04/2022 14:50   DG Chest Port 1 View  Result Date: 09/04/2022 CLINICAL DATA:  Status post coronary artery bypass surgery EXAM: PORTABLE CHEST 1 VIEW COMPARISON:  09/01/2022 FINDINGS: There is interval coronary bypass surgery. Transverse diameter of heart is increased. Central pulmonary vessels are prominent. There are no signs of alveolar pulmonary edema. There is homogeneous opacity in the medial left lower lung field. Left chest tube is noted. There is Jisela Merlino mediastinal drain. Tip of right IJ central venous catheter is seen in superior vena cava. Enteric tube is noted traversing the esophagus. Tip of endotracheal tube is 2 cm above the carina. Left lateral CP angle is indistinct. There is no pneumothorax. Left cervical rib is seen. IMPRESSION: Status post coronary bypass surgery. There is infiltrate in the medial left lower lung fields suggesting atelectasis. Possible small left pleural effusion. Support devices as described in the body of the report. Electronically Signed   By: Elmer Picker M.D.   On: 09/04/2022 14:49   VAS US DOPPLER PRE CABG  Result Date: 09/02/2022 PREOPERATIVE VASCULAR EVALUATION Patient Name:  JAKIAH BIENAIME  Date of Exam:   09/01/2022 Medical Rec #: [id number]           Accession #:    [id number] Date of Birth: Aug 03, 1951  Patient Gender: F Patient Age:   71 years Exam Location:  [facility] Procedure:      VAS US DOPPLER PRE CABG Referring Phys: HARRELL LIGHTFOOT --------------------------------------------------------------------------------  Indications:  Pre-CABG. Risk Factors: Hypertension, hyperlipidemia, Diabetes, current smoker,  coronary               artery disease. Performing Technologist: Oda Cogan RDMS, RVT  Examination Guidelines: Corinthian Mizrahi complete evaluation includes B-mode imaging, spectral Doppler, color Doppler, and power Doppler as needed of all accessible portions of each vessel. Bilateral testing is considered an integral part of Rainy Rothman complete examination. Limited examinations for reoccurring indications may be performed as noted.  Right Carotid Findings: +----------+--------+--------+--------+------------+---------------------------+           PSV cm/sEDV cm/sStenosisDescribe    Comments                    +----------+--------+--------+--------+------------+---------------------------+ CCA Prox  103     19                                                      +----------+--------+--------+--------+------------+---------------------------+ CCA Mid                                       intimal thickening          +----------+--------+--------+--------+------------+---------------------------+ CCA Distal107     19                          intimal thickening          +----------+--------+--------+--------+------------+---------------------------+ ICA Prox  173     28      1-39%   heterogenousVelocities suggest high end                                               of scale                    +----------+--------+--------+--------+------------+---------------------------+ ICA Mid   104     19                                                      +----------+--------+--------+--------+------------+---------------------------+ ICA Distal92      25                                                      +----------+--------+--------+--------+------------+---------------------------+ ECA       228                                                             +----------+--------+--------+--------+------------+---------------------------+  +----------+--------+-------+----------------+------------+  PSV cm/sEDV cmsDescribe        Arm Pressure +----------+--------+-------+----------------+------------+ Subclavian213            Multiphasic, WNL             +----------+--------+-------+----------------+------------+ +---------+--------+--+--------+--+---------+ VertebralPSV cm/s53EDV cm/s17Antegrade +---------+--------+--+--------+--+---------+ Left Carotid Findings: +----------+--------+--------+--------+------------+---------------------------+           PSV cm/sEDV cm/sStenosisDescribe    Comments                    +----------+--------+--------+--------+------------+---------------------------+ CCA Prox  107     22                                                      +----------+--------+--------+--------+------------+---------------------------+ CCA Mid                           heterogenous                            +----------+--------+--------+--------+------------+---------------------------+ CCA Distal94      24                          intimal thickening          +----------+--------+--------+--------+------------+---------------------------+ ICA Prox  135     37      1-39%   heterogenousVelocities suggest upper                                                  end of scale                +----------+--------+--------+--------+------------+---------------------------+ ICA Mid   132     36                                                      +----------+--------+--------+--------+------------+---------------------------+ ICA Distal111     34                                                      +----------+--------+--------+--------+------------+---------------------------+ ECA       185     19                                                      +----------+--------+--------+--------+------------+---------------------------+  +----------+--------+--------+----------------+------------+ SubclavianPSV cm/sEDV cm/sDescribe        Arm Pressure +----------+--------+--------+----------------+------------+           189             Multiphasic, WNL             +----------+--------+--------+----------------+------------+ +---------+--------+---+--------+--+---------+ VertebralPSV cm/s107EDV cm/s13Antegrade +---------+--------+---+--------+--+---------+  ABI Findings: +---------+------------------+-----+--------+--------+ Right    Rt Pressure (mmHg)IndexWaveformComment  +---------+------------------+-----+--------+--------+  Brachial 163                                     +---------+------------------+-----+--------+--------+ PTA      86                0.51 biphasic         +---------+------------------+-----+--------+--------+ DP       77                0.46 biphasic         +---------+------------------+-----+--------+--------+ Great Toe45                0.27                  +---------+------------------+-----+--------+--------+ +---------+------------------+-----+--------+-------+ Left     Lt Pressure (mmHg)IndexWaveformComment +---------+------------------+-----+--------+-------+ Brachial 169                                    +---------+------------------+-----+--------+-------+ PTA      118               0.70 biphasic        +---------+------------------+-----+--------+-------+ DP       112               0.66 biphasic        +---------+------------------+-----+--------+-------+ Great Toe78                0.46                 +---------+------------------+-----+--------+-------+ +-------+---------------+----------------+ ABI/TBIToday's ABI/TBIPrevious ABI/TBI +-------+---------------+----------------+ Right  0.51                            +-------+---------------+----------------+ Left   0.70                             +-------+---------------+----------------+  Right Doppler Findings: +-----------+--------+-----+---------+--------+ Site       PressureIndexDoppler  Comments +-----------+--------+-----+---------+--------+ Brachial   163                            +-----------+--------+-----+---------+--------+ Radial                  triphasic         +-----------+--------+-----+---------+--------+ Ulnar                   triphasic         +-----------+--------+-----+---------+--------+ Palmar Arch                      WNL      +-----------+--------+-----+---------+--------+  Left Doppler Findings: +-----------+--------+-----+---------+-------------+ Site       PressureIndexDoppler  Comments      +-----------+--------+-----+---------+-------------+ Brachial   169                                 +-----------+--------+-----+---------+-------------+ Radial                  triphasic              +-----------+--------+-----+---------+-------------+ Ulnar  triphasic              +-----------+--------+-----+---------+-------------+ Palmar Arch                      left abnormal +-----------+--------+-----+---------+-------------+   Summary: Right Carotid: Velocities in the right ICA are consistent with Piotr Christopher 1-39% stenosis. Left Carotid: Velocities in the left ICA are consistent with Miana Politte 1-39% stenosis. Vertebrals:  Bilateral vertebral arteries demonstrate antegrade flow. Subclavians: Normal flow hemodynamics were seen in bilateral subclavian              arteries. Right ABI: Resting right ankle-brachial index indicates moderate right lower extremity arterial disease. The right toe-brachial index is abnormal. Left ABI: Resting left ankle-brachial index indicates moderate left lower extremity arterial disease. The left toe-brachial index is abnormal. Right Upper Extremity: Doppler waveforms remain within normal limits with right radial compression. Doppler waveforms  remain within normal limits with right ulnar compression. Left Upper Extremity: Doppler waveforms remain within normal limits with left radial compression. Doppler waveforms decrease >50% with left ulnar compression.  Electronically signed by Jamelle Haring on 09/02/2022 at 10:38:48 AM.    Final     Microbiology: Recent Results (from the past 240 hour(s))  Resp Panel by RT-PCR (Flu Tammey Deeg&B, Covid) Anterior Nasal Swab     Status: None   Collection Time: 09/21/22  1:03 PM   Specimen: Anterior Nasal Swab  Result Value Ref Range Status   SARS Coronavirus 2 by RT PCR NEGATIVE NEGATIVE Final    Comment: (NOTE) SARS-CoV-2 target nucleic acids are NOT DETECTED.  The SARS-CoV-2 RNA is generally detectable in upper respiratory specimens during the acute phase of infection. The lowest concentration of SARS-CoV-2 viral copies this assay can detect is 138 copies/mL. Akita Maxim negative result does not preclude SARS-Cov-2 infection and should not be used as the sole basis for treatment or other patient management decisions. Arneda Sappington negative result may occur with  improper specimen collection/handling, submission of specimen other than nasopharyngeal swab, presence of viral mutation(s) within the areas targeted by this assay, and inadequate number of viral copies(<138 copies/mL). Keasia Dubose negative result must be combined with clinical observations, patient history, and epidemiological information. The expected result is Negative.  Fact Sheet for Patients:  EntrepreneurPulse.com.au  Fact Sheet for Healthcare Providers:  IncredibleEmployment.be  This test is no t yet approved or cleared by the [place] FDA and  has been authorized for detection and/or diagnosis of SARS-CoV-2 by FDA under an Emergency Use Authorization (EUA). This EUA will remain  in effect (meaning this test can be used) for the duration of the COVID-19 declaration under Section 564(b)(1) of the Act, 21 U.S.C.section  360bbb-3(b)(1), unless the authorization is terminated  or revoked sooner.       Influenza Antoni Stefan by PCR NEGATIVE NEGATIVE Final   Influenza B by PCR NEGATIVE NEGATIVE Final    Comment: (NOTE) The Xpert Xpress SARS-CoV-2/FLU/RSV plus assay is intended as an aid in the diagnosis of influenza from Nasopharyngeal swab specimens and should not be used as Maryalyce Sanjuan sole basis for treatment. Nasal washings and aspirates are unacceptable for Xpert Xpress SARS-CoV-2/FLU/RSV testing.  Fact Sheet for Patients: EntrepreneurPulse.com.au  Fact Sheet for Healthcare Providers: IncredibleEmployment.be  This test is not yet approved or cleared by the [place] FDA and has been authorized for detection and/or diagnosis of SARS-CoV-2 by FDA under an Emergency Use Authorization (EUA). This EUA will remain in effect (meaning this test can be used) for the duration of the COVID-19  declaration under Section 564(b)(1) of the Act, 21 U.S.C. section 360bbb-3(b)(1), unless the authorization is terminated or revoked.  Performed at [facility], [place] [address], [place], [place] [address]   Culture, blood (routine x 2)     Status: None   Collection Time: 09/21/22  1:03 PM   Specimen: BLOOD  Result Value Ref Range Status   Specimen Description BLOOD LEFT ANTECUBITAL  Final   Special Requests   Final    BOTTLES DRAWN AEROBIC AND ANAEROBIC Blood Culture adequate volume   Culture   Final    NO GROWTH 5 DAYS Performed at [facility], [place] [address], [place], [place] [address]    Report Status 09/26/2022 FINAL  Final  Culture, blood (routine x 2)     Status: None   Collection Time: 09/21/22  1:20 PM   Specimen: BLOOD LEFT HAND  Result Value Ref Range Status   Specimen Description BLOOD LEFT HAND  Final   Special Requests   Final    BOTTLES DRAWN AEROBIC AND ANAEROBIC Blood Culture adequate volume   Culture   Final    NO GROWTH 5 DAYS Performed at [facility], [place] [address] [place], [place] [address]    Report Status 09/26/2022 FINAL  Final     Labs: Basic Metabolic Panel: Recent Labs  Lab 09/22/22 0842 09/23/22 0131 09/24/22 0752 09/25/22 0229 09/26/22 0139 09/27/22 0447  NA 140 140 139 141 139 138  K 4.6 4.6 3.9 4.0 3.6 3.8  CL 110 111 103 108 104 103  CO2 17* 18* 21* '22 24 22  '$ GLUCOSE 209* 157* 173* 178* 133* 169*  BUN 64* 54* 32* '23 17 17  '$ CREATININE 2.09* 1.65* 1.24* 1.03* 1.03* 0.94  CALCIUM 8.4* 8.7* 9.1 8.9 9.0 8.9  MG 1.3* 2.4  --   --  1.4* 2.1  PHOS 4.4  --   --   --  2.7 2.7   Liver Function Tests: Recent Labs  Lab 09/23/22 0131 09/24/22 0752 09/25/22 0229 09/26/22 0139 09/27/22 0447  AST 11* '19 17 18 16  '$ ALT '14 15 12 14 13  '$ ALKPHOS 72 86 70 73 65  BILITOT 0.2* 0.8 0.3 0.6 0.6  PROT 5.5* 6.1* 5.5* 5.8* 5.9*  ALBUMIN 2.3* 2.4* 2.2* 2.4* 2.5*   No results for input(s): "LIPASE", "AMYLASE" in the last 168 hours. Recent Labs  Lab 09/25/22 0229 09/27/22 0447  AMMONIA 38* 32   CBC: Recent Labs  Lab 09/21/22 1315 09/21/22 1329 09/23/22 1955 09/24/22 0752 09/25/22 0229 09/26/22 0139 09/27/22 0318  WBC 9.0   < > 8.7 8.1 7.1 6.4 7.5  NEUTROABS 7.9*  --   --   --   --  4.4 5.5  HGB 8.6*   < > 9.4* 10.9* 10.9* 10.9* 12.5  HCT 28.9*   < > 29.3* 33.7* 32.8* 33.7* 38.7  MCV 86.8   < > 82.1 80.8 81.0 81.4 82.2  PLT 655*   < > 555* 599* 515* 516* 384   < > = values in this interval not displayed.   Cardiac Enzymes: No results for input(s): "CKTOTAL", "CKMB", "CKMBINDEX", "TROPONINI" in the last 168 hours. BNP: BNP (last 3 results) Recent Labs    09/21/22 1315  BNP 2,564.7*    ProBNP (last 3 results) No results for input(s): "PROBNP" in the last 8760 hours.  CBG: Recent Labs  Lab 09/26/22 1222 09/26/22 1624 09/26/22 2128 09/27/22 0838 09/27/22 1224  GLUCAP 251* 230*  116* 146* 299*       Signed:  Fayrene Helper MD.  [facility] 09/27/2022, 2:10 PM

## 2022-09-27 NOTE — Progress Notes (Signed)
Rounding Note    Patient Name: Jasmine Keith Date of Encounter: 09/27/2022  [place] Cardiologist: Jenean Lindau, MD   Subjective   Laying in bed.  Still somewhat uncomfortable.  Blood pressure has been challenging to control.  Inpatient Medications    Scheduled Meds:  amLODipine  10 mg Oral Daily   busPIRone  7.5 mg Oral BID   carvedilol  6.25 mg Oral BID WC   feeding supplement  237 mL Oral TID BM   fenofibrate  160 mg Oral Daily   heparin  5,000 Units Subcutaneous Q8H   hydrALAZINE  100 mg Oral TID   insulin aspart  0-5 Units Subcutaneous QHS   insulin aspart  0-9 Units Subcutaneous TID WC   levothyroxine  25 mcg Oral Q0600   lidocaine  1 patch Transdermal Q24H   sacubitril-valsartan  1 tablet Oral BID   venlafaxine XR  75 mg Oral Q breakfast   Continuous Infusions:   PRN Meds: dicyclomine, hydrALAZINE, labetalol, meclizine, ondansetron, mouth rinse, oxyCODONE, traZODone   Vital Signs    Vitals:   09/26/22 1403 09/26/22 1919 09/26/22 2002 09/27/22 0500  BP: (!) 145/54 (!) 152/62 138/62 (!) 170/75  Pulse: 82  69 85  Resp:   18 17  Temp:   97.9 F (36.6 C) 97.8 F (36.6 C)  TempSrc:   Oral Oral  SpO2: 97%  97% 97%  Weight:    49.3 kg  Height:        Intake/Output Summary (Last 24 hours) at 09/27/2022 0954 Last data filed at 09/27/2022 0450 Gross per 24 hour  Intake 540 ml  Output 800 ml  Net -260 ml      09/27/2022    5:00 AM 09/26/2022    4:35 AM 09/25/2022    5:00 AM  Last 3 Weights  Weight (lbs) 108 lb 11 oz 109 lb 2 oz 111 lb 1.8 oz  Weight (kg) 49.3 kg 49.5 kg 50.4 kg      Telemetry    Sinus rhythm- Personally Reviewed  ECG    No new- Personally Reviewed  Physical Exam   GEN: No acute distress.   Neck: No JVD Cardiac: RRR, no murmurs, rubs, or gallops.  Chest wound clean dry and intact post CABG late October. Respiratory: Clear to auscultation bilaterally. GI: Soft, nontender, non-distended  MS: No  edema; No deformity. Neuro:  Nonfocal  Psych: Normal affect   Labs    High Sensitivity Troponin:   Recent Labs  Lab 09/21/22 1315 09/21/22 1600 09/23/22 1503  TROPONINIHS 36* 33* 34*     Chemistry Recent Labs  Lab 09/23/22 0131 09/24/22 0752 09/25/22 0229 09/26/22 0139 09/27/22 0447  NA 140   < > 141 139 138  K 4.6   < > 4.0 3.6 3.8  CL 111   < > 108 104 103  CO2 18*   < > '22 24 22  '$ GLUCOSE 157*   < > 178* 133* 169*  BUN 54*   < > '23 17 17  '$ CREATININE 1.65*   < > 1.03* 1.03* 0.94  CALCIUM 8.7*   < > 8.9 9.0 8.9  MG 2.4  --   --  1.4* 2.1  PROT 5.5*   < > 5.5* 5.8* 5.9*  ALBUMIN 2.3*   < > 2.2* 2.4* 2.5*  AST 11*   < > '17 18 16  '$ ALT 14   < > '12 14 13  '$ ALKPHOS 72   < >  70 73 65  BILITOT 0.2*   < > 0.3 0.6 0.6  GFRNONAA 33*   < > 59* 59* >60  ANIONGAP 11   < > '11 11 13   '$ < > = values in this interval not displayed.    Lipids No results for input(s): "CHOL", "TRIG", "HDL", "LABVLDL", "LDLCALC", "CHOLHDL" in the last 168 hours.  Hematology Recent Labs  Lab 09/25/22 0229 09/26/22 0139 09/27/22 0318  WBC 7.1 6.4 7.5  RBC 4.05 4.14 4.71  HGB 10.9* 10.9* 12.5  HCT 32.8* 33.7* 38.7  MCV 81.0 81.4 82.2  MCH 26.9 26.3 26.5  MCHC 33.2 32.3 32.3  RDW 15.4 15.2 15.3  PLT 515* 516* 384   Thyroid  Recent Labs  Lab 09/21/22 1348  TSH 3.057  FREET4 1.19*    BNP Recent Labs  Lab 09/21/22 1315  BNP 2,564.7*    DDimer No results for input(s): "DDIMER" in the last 168 hours.   Radiology    No results found.  Cardiac Studies   Echocardiogram 09/22/2022-EF greater than 75%  Patient Profile     71 y.o. 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    Assessment & Plan    Symptomatic bradycardia while  on coreg 25 mg twice a day.  This had been discontinued on admit., K+ 5.2 and AKI.  Now resolved.  In fact, her heart rate was in the 90s/low 100s on 11/21.  I added back lower dose of carvedilol back at 6.25 mg twice a day on 11/21 which will help with her heart rate as well as her blood pressure.  Watch for any signs of bradycardia.   Anemia down to 7.0 and transfused 1 unit PRBCs --today Hgb 12.5, stable   AKI with cr up from 1.3 to 2.25 on admit now at 0.9 after IV fluids.  Stable from yesterday.   Recent CABG X 3 09/04/22  --TTE stable with normal EF reassuring.  Continue with goal-directed medical therapy including daily aspirin.   HTN  --was difficult to control post op --now on amlodipine 10 mg daily, hydralazine 100 mg TID -- Restarted Entresto at low-dose 24/26 on 09/25/2022.  She had previously been on moderate dose 49/51.  She tolerated this lower dose yesterday, no increase significant in creatinine.  1.03.  Lets go ahead and raise the Entresto back to her moderate dose 49/51.  Remember we were being careful secondary to AKI on admission.     DM/hypothyroidism/COPD/acute metabolic encephalopathy per IM  Mobilize.   No new recs today. BP should continue to improve as Delene Loll continues to enter her system.  OK with DC to SNF from cardiology perspective.    For questions or updates, please contact [place] Please consult [email or web address] for contact info under        Signed, Candee Furbish, MD  09/27/2022, 9:54 AM

## 2022-09-27 NOTE — Progress Notes (Signed)
Mobility Specialist - Progress Note   09/27/22 0947  Mobility  Activity Dangled on edge of bed  Level of Assistance Standby assist, set-up cues, supervision of patient - no hands on  Assistive Device None  Activity Response Tolerated poorly  Mobility Referral Yes  $Mobility charge 1 Mobility    Pre-mobility: 98% SpO2 During mobility: 150/75(103)BP, 99%SpO2 Post-mobility: 171/76(106) BP,97% SpO2  Pt received in bed and agreeable to mobility. Upon getting EOB pt expressed she was having some dizziness and nausea and requesting to lay back down. Pt BP was taken and was within pt baseline. Pt was still refusing further mobility and laid back down. Pt was left in bed with all needs met and bed alarm on.   Franki Monte  Mobility Specialist Please contact via Solicitor or Rehab office at [phone]

## 2022-09-27 NOTE — Progress Notes (Signed)
Pt report called to RN, [facility]

## 2022-10-02 ENCOUNTER — Other Ambulatory Visit: Payer: Self-pay | Admitting: *Deleted

## 2022-10-02 ENCOUNTER — Encounter: Payer: Self-pay | Admitting: Cardiology

## 2022-10-02 ENCOUNTER — Ambulatory Visit (INDEPENDENT_AMBULATORY_CARE_PROVIDER_SITE_OTHER): Payer: Medicare Other | Admitting: Cardiology

## 2022-10-02 VITALS — BP 118/46 | HR 72 | Ht 61.0 in | Wt 102.2 lb

## 2022-10-02 DIAGNOSIS — I2510 Atherosclerotic heart disease of native coronary artery without angina pectoris: Secondary | ICD-10-CM | POA: Diagnosis not present

## 2022-10-02 DIAGNOSIS — Z720 Tobacco use: Secondary | ICD-10-CM

## 2022-10-02 DIAGNOSIS — E088 Diabetes mellitus due to underlying condition with unspecified complications: Secondary | ICD-10-CM

## 2022-10-02 DIAGNOSIS — Z951 Presence of aortocoronary bypass graft: Secondary | ICD-10-CM

## 2022-10-02 NOTE — Patient Outreach (Signed)
Per [facility] Jasmine Keith resides in [facility]. Screening for potential [facility] care coordination services as benefit of insurance plan and PCP.  Secure communication sent to SNF social worker to inquire about transition plans.   Will continue to follow while Jasmine Keith resides in SNF.   Marthenia Rolling, MSN, RN,BSN [facility] Acute Care Coordinator [phone] (Direct dial)

## 2022-10-02 NOTE — Patient Instructions (Signed)
Medication Instructions:  Your physician recommends that you continue on your current medications as directed. Please refer to the Current Medication list given to you today.  *If you need a refill on your cardiac medications before your next appointment, please call your pharmacy*   Lab Work: None ordered If you have labs (blood work) drawn today and your tests are completely normal, you will receive your results only by: [place] (if you have MyChart) OR A paper copy in the mail If you have any lab test that is abnormal or we need to change your treatment, we will call you to review the results.   Testing/Procedures: None ordered   Follow-Up: At [facility], you and your health needs are our priority.  As part of our continuing mission to provide you with exceptional heart care, we have created designated Provider Care Teams.  These Care Teams include your primary Cardiologist (physician) and Advanced Practice Providers (APPs -  Physician Assistants and Nurse Practitioners) who all work together to provide you with the care you need, when you need it.  We recommend signing up for the patient portal called "MyChart".  Sign up information is provided on this After Visit Summary.  MyChart is used to connect with patients for Virtual Visits (Telemedicine).  Patients are able to view lab/test results, encounter notes, upcoming appointments, etc.  Non-urgent messages can be sent to your provider as well.   To learn more about what you can do with MyChart, go to [email or web address].    Your next appointment:   1 month(s)  The format for your next appointment:   In Person  Provider:   Jyl Heinz, MD   Other Instructions NA
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
## 2022-10-06 ENCOUNTER — Ambulatory Visit (INDEPENDENT_AMBULATORY_CARE_PROVIDER_SITE_OTHER): Payer: Self-pay | Admitting: Thoracic Surgery (Cardiothoracic Vascular Surgery)

## 2022-10-06 ENCOUNTER — Other Ambulatory Visit: Payer: Self-pay | Admitting: *Deleted

## 2022-10-06 DIAGNOSIS — Z951 Presence of aortocoronary bypass graft: Secondary | ICD-10-CM

## 2022-10-06 NOTE — Patient Outreach (Signed)
Per [facility] Mrs. Jetter remains in [place] [facility]. She admitted on 09/27/22 to [facility].  Writer made aware Mrs. Bruni was followed by [facility] care coordination team prior.   Writer will continue to follow and collaborate with SNF social worker while Mrs. Witham resides in SNF.  Will keep [facility] care coordination team informed as appropriate.    Marthenia Rolling, MSN, RN,BSN [place] Acute Care Coordinator [phone] (Direct dial)

## 2022-10-06 NOTE — Progress Notes (Signed)
     [address]       [place] [address]             [phone]       Patient: Home Provider: Office Consent for Telemedicine visit obtained.  Today's visit was completed via a real-time telehealth (see specific modality noted below). The patient/authorized person provided oral consent at the time of the visit to engage in a telemedicine encounter with the present provider at [facility]. The patient/authorized person was informed of the potential benefits, limitations, and risks of telemedicine. The patient/authorized person expressed understanding that the laws that protect confidentiality also apply to telemedicine. The patient/authorized person acknowledged understanding that telemedicine does not provide emergency services and that he or she would need to call 911 or proceed to the nearest hospital for help if such a need arose.   Total time spent in the clinical discussion 10 minutes.  Telehealth Modality: Phone visit (audio only)  I had a telephone visit with the husband of IRINE HEMINGER who is s/p CABG.  She was readmitted for bradycardic episode, but recovered from that.  She was discharged to a rehab facility, and recently contracted [facility].  Per her husband's report she is doing well and has been stable.  She still remains quite weak and is not able to walk much. [facility] will see [place] back in 1 month with a chest x-ray for cardiac rehab clearance.  Indianna Boran Bary Leriche

## 2022-10-11 ENCOUNTER — Other Ambulatory Visit: Payer: Self-pay | Admitting: *Deleted

## 2022-10-11 NOTE — Patient Outreach (Signed)
[facility] Post- Acute Care Coordinator follow up. Mrs. Jasmine Keith resides in [facility]. Mrs. Jasmine Keith has been engaged by [facility] care coordination team recently.   Spoke with Janett Billow, SNF social worker. Mrs. Jasmine Keith is from home with spouse. Currently she is COVID positive. Therapy progress has been slow thus far. Goal is to return home.   Will continue to follow while Mrs. Jasmine Keith resides in SNF.    Jasmine Rolling, MSN, RN,BSN [place] Acute Care Coordinator [phone] (Direct dial)

## 2022-10-12 ENCOUNTER — Telehealth: Payer: Self-pay | Admitting: Medical

## 2022-10-12 NOTE — Telephone Encounter (Signed)
Last note faxed

## 2022-10-12 NOTE — Telephone Encounter (Signed)
Pt is being discharged from the nursing home she was at and they have referred her to [facility]. They are needing her most recent ov notes faxed to [phone].

## 2022-10-17 ENCOUNTER — Other Ambulatory Visit: Payer: Self-pay

## 2022-10-17 MED ORDER — CARVEDILOL 6.25 MG PO TABS: 6.2500 mg | ORAL_TABLET | Freq: Two times a day (BID) | ORAL | 1 refills | Status: DC

## 2022-10-19 ENCOUNTER — Telehealth: Payer: Self-pay | Admitting: Medical

## 2022-10-19 NOTE — Telephone Encounter (Signed)
Caller/Agency: [facility] Callback Number: [phone] Requesting OT/PT/Skilled Nursing/Social Work/Speech Therapy: PT  Frequency: 1x1, 2x1, 1x7  For strengthening exercise, home exercise program, safety education, gait training and balance

## 2022-10-20 NOTE — Telephone Encounter (Signed)
Cecil from [facility] called, requesting a call back to get [facility] verbal orders. Please see below.

## 2022-10-20 NOTE — Telephone Encounter (Signed)
Please advise 

## 2022-10-20 NOTE — Telephone Encounter (Signed)
Spoke w/ Laveda Abbe, verbal orders given

## 2022-10-23 ENCOUNTER — Telehealth: Payer: Self-pay | Admitting: Medical

## 2022-10-23 ENCOUNTER — Other Ambulatory Visit: Payer: Self-pay | Admitting: *Deleted

## 2022-10-23 NOTE — Patient Outreach (Signed)
[place] Coordinator follow up. Verified in [facility] Jasmine Keith returned home from [place] on 10/16/22. Will have [facility].   Will send notification to [facility] care coordinator of SNF discharge.   Marthenia Rolling, MSN, RN,BSN [place] Acute Care Coordinator [phone] (Direct dial)

## 2022-10-23 NOTE — Telephone Encounter (Signed)
Jasmine Keith with [facility] called to advise that they are seeing patient for disease management and education.Patient is on levemir and novolog and has to stick herself 4x per day which results in the patient being noncompliant which is worse. Jasmine Keith wants to know if provider would be willing to send a prescription in for a Freestyle Libre to her pharmacy. Her callback is [phone]

## 2022-10-24 ENCOUNTER — Ambulatory Visit (INDEPENDENT_AMBULATORY_CARE_PROVIDER_SITE_OTHER): Payer: Medicare Other | Admitting: Medical

## 2022-10-24 ENCOUNTER — Telehealth: Payer: Self-pay | Admitting: *Deleted

## 2022-10-24 VITALS — BP 124/68 | HR 76 | Temp 98.2°F | Resp 18 | Ht 61.0 in | Wt 100.0 lb

## 2022-10-24 DIAGNOSIS — I2510 Atherosclerotic heart disease of native coronary artery without angina pectoris: Secondary | ICD-10-CM | POA: Diagnosis not present

## 2022-10-24 DIAGNOSIS — D649 Anemia, unspecified: Secondary | ICD-10-CM | POA: Diagnosis not present

## 2022-10-24 DIAGNOSIS — F17200 Nicotine dependence, unspecified, uncomplicated: Secondary | ICD-10-CM

## 2022-10-24 DIAGNOSIS — E1100 Type 2 diabetes mellitus with hyperosmolarity without nonketotic hyperglycemic-hyperosmolar coma (NKHHC): Secondary | ICD-10-CM

## 2022-10-24 DIAGNOSIS — Z23 Encounter for immunization: Secondary | ICD-10-CM | POA: Diagnosis not present

## 2022-10-24 DIAGNOSIS — R7989 Other specified abnormal findings of blood chemistry: Secondary | ICD-10-CM

## 2022-10-24 LAB — CBC WITH DIFFERENTIAL/PLATELET
Basophils Absolute: 0.1 10*3/uL (ref 0.0–0.1)
Basophils Relative: 2 % (ref 0.0–3.0)
Eosinophils Absolute: 0.5 10*3/uL (ref 0.0–0.7)
Eosinophils Relative: 6.2 % — ABNORMAL HIGH (ref 0.0–5.0)
HCT: 28.8 % — ABNORMAL LOW (ref 36.0–46.0)
Hemoglobin: 9.5 g/dL — ABNORMAL LOW (ref 12.0–15.0)
Lymphocytes Relative: 15.9 % (ref 12.0–46.0)
Lymphs Abs: 1.2 10*3/uL (ref 0.7–4.0)
MCHC: 33 g/dL (ref 30.0–36.0)
MCV: 80.7 fl (ref 78.0–100.0)
Monocytes Absolute: 0.3 10*3/uL (ref 0.1–1.0)
Monocytes Relative: 3.8 % (ref 3.0–12.0)
Neutro Abs: 5.3 10*3/uL (ref 1.4–7.7)
Neutrophils Relative %: 72.1 % (ref 43.0–77.0)
Platelets: 455 10*3/uL — ABNORMAL HIGH (ref 150.0–400.0)
RBC: 3.57 Mil/uL — ABNORMAL LOW (ref 3.87–5.11)
RDW: 18.6 % — ABNORMAL HIGH (ref 11.5–15.5)
WBC: 7.3 10*3/uL (ref 4.0–10.5)

## 2022-10-24 LAB — COMPREHENSIVE METABOLIC PANEL
ALT: 9 U/L (ref 0–35)
AST: 17 U/L (ref 0–37)
Albumin: 3.9 g/dL (ref 3.5–5.2)
Alkaline Phosphatase: 49 U/L (ref 39–117)
BUN: 22 mg/dL (ref 6–23)
CO2: 23 mEq/L (ref 19–32)
Calcium: 9 mg/dL (ref 8.4–10.5)
Chloride: 103 mEq/L (ref 96–112)
Creatinine, Ser: 1.08 mg/dL (ref 0.40–1.20)
GFR: 52.23 mL/min — ABNORMAL LOW (ref >60.00)
Glucose, Bld: 113 mg/dL — ABNORMAL HIGH (ref 70–99)
Potassium: 3.9 mEq/L (ref 3.5–5.1)
Sodium: 137 mEq/L (ref 135–145)
Total Bilirubin: 0.5 mg/dL (ref 0.2–1.2)
Total Protein: 6.4 g/dL (ref 6.0–8.3)

## 2022-10-24 LAB — MAGNESIUM: Magnesium: 0.8 mg/dL — CL (ref 1.5–2.5)

## 2022-10-24 MED ORDER — FREESTYLE LIBRE 14 DAY READER DEVI: 1.0000 | Freq: Every day | 0 refills | Status: DC

## 2022-10-24 MED ORDER — SACUBITRIL-VALSARTAN 49-51 MG PO TABS: 1.0000 | ORAL_TABLET | Freq: Two times a day (BID) | ORAL | 3 refills | Status: DC

## 2022-10-24 MED ORDER — FREESTYLE LIBRE 14 DAY SENSOR MISC: 1.0000 | Freq: Every day | 0 refills | Status: DC

## 2022-10-24 NOTE — Telephone Encounter (Signed)
Jasmine Keith called earlier about critical lab for pt.  Magnesium is 0.8.  They did put in epic and should have been sent to you.    I do apologize about delay.
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
## 2022-10-24 NOTE — Addendum Note (Signed)
Addended by: Kem Boroughs D on: 10/24/2022 06:44 PM   Modules accepted: Orders

## 2022-10-24 NOTE — Telephone Encounter (Signed)
Meter sent in

## 2022-10-24 NOTE — Addendum Note (Signed)
Addended by: Jeronimo Greaves on: 10/24/2022 01:05 PM   Modules accepted: Orders

## 2022-10-24 NOTE — Patient Instructions (Addendum)
Overall appear stable posthospitalization for CABG.  Also subsequent severe bradycardia resulted in second hospitalization.  Then you had COVID infection while at nursing home/rehab.  1. Type 2 diabetes mellitus with hyperosmolarity without coma, without long-term current use of insulin ([place]) Presently recommend continuing Levemir and NovoLog.  Check sugars 4 times daily and update me sugar readings in 1 week.  Might modify your sliding scale if getting any low readings.  Will get A1c in early January. - Comp Met (CMET)  2. Hypomagnesemia Low magnesium has been a recurrent issue in the past so we will get magnesium level. - Magnesium  3. Anemia, unspecified type - CBC w/Diff  4. Smoker Please try to quit smoking.  5. Elevated brain natriuretic peptide (BNP) level I refilled your Entresto today.  This was prescribed when you were hospitalized.  Please see previous BMP significantly elevated but did not see CHF findings on echo or chest x-ray.  6. Coronary artery disease involving native heart, unspecified vessel or lesion type, unspecified whether angina present Followed by cardiothoracic surgeon and reviewed cardiologist note today.  7.  Prior bradycardia now resolved.  Follow-up date to be determined after lab review and after you update me on your sugar/glucose readings in 1 week.

## 2022-10-25 ENCOUNTER — Other Ambulatory Visit: Payer: Self-pay

## 2022-10-25 ENCOUNTER — Telehealth: Payer: Self-pay | Admitting: *Deleted

## 2022-10-25 ENCOUNTER — Emergency Department (HOSPITAL_BASED_OUTPATIENT_CLINIC_OR_DEPARTMENT_OTHER)
Admission: EM | Admit: 2022-10-25 | Discharge: 2022-10-25 | Disposition: A | Discharge status: Home / Self Care | Payer: Medicare Other | Attending: Emergency Medicine | Admitting: Emergency Medicine

## 2022-10-25 DIAGNOSIS — Z8616 Personal history of COVID-19: Secondary | ICD-10-CM | POA: Diagnosis not present

## 2022-10-25 DIAGNOSIS — Z794 Long term (current) use of insulin: Secondary | ICD-10-CM | POA: Insufficient documentation

## 2022-10-25 DIAGNOSIS — Z7982 Long term (current) use of aspirin: Secondary | ICD-10-CM | POA: Diagnosis not present

## 2022-10-25 LAB — CBC WITH DIFFERENTIAL/PLATELET
Abs Immature Granulocytes: 0.02 10*3/uL (ref 0.00–0.07)
Basophils Absolute: 0.1 10*3/uL (ref 0.0–0.1)
Basophils Relative: 2 %
Eosinophils Absolute: 0.4 10*3/uL (ref 0.0–0.5)
Eosinophils Relative: 7 %
HCT: 28.3 % — ABNORMAL LOW (ref 36.0–46.0)
Hemoglobin: 9.3 g/dL — ABNORMAL LOW (ref 12.0–15.0)
Immature Granulocytes: 0 %
Lymphocytes Relative: 13 %
Lymphs Abs: 0.8 10*3/uL (ref 0.7–4.0)
MCH: 26.4 pg (ref 26.0–34.0)
MCHC: 32.9 g/dL (ref 30.0–36.0)
MCV: 80.4 fL (ref 80.0–100.0)
Monocytes Absolute: 0.3 10*3/uL (ref 0.1–1.0)
Monocytes Relative: 4 %
Neutro Abs: 4.7 10*3/uL (ref 1.7–7.7)
Neutrophils Relative %: 74 %
Platelets: 414 10*3/uL — ABNORMAL HIGH (ref 150–400)
RBC: 3.52 MIL/uL — ABNORMAL LOW (ref 3.87–5.11)
RDW: 17.6 % — ABNORMAL HIGH (ref 11.5–15.5)
WBC: 6.4 10*3/uL (ref 4.0–10.5)
nRBC: 0 % (ref 0.0–0.2)

## 2022-10-25 LAB — COMPREHENSIVE METABOLIC PANEL
ALT: 10 U/L (ref 0–44)
AST: 24 U/L (ref 15–41)
Albumin: 3.4 g/dL — ABNORMAL LOW (ref 3.5–5.0)
Alkaline Phosphatase: 51 U/L (ref 38–126)
Anion gap: 9 (ref 5–15)
BUN: 27 mg/dL — ABNORMAL HIGH (ref 8–23)
CO2: 20 mmol/L — ABNORMAL LOW (ref 22–32)
Calcium: 8.8 mg/dL — ABNORMAL LOW (ref 8.9–10.3)
Chloride: 108 mmol/L (ref 98–111)
Creatinine, Ser: 1.34 mg/dL — ABNORMAL HIGH (ref 0.44–1.00)
GFR, Estimated: 43 mL/min — ABNORMAL LOW (ref >60)
Glucose, Bld: 193 mg/dL — ABNORMAL HIGH (ref 70–99)
Potassium: 3.5 mmol/L (ref 3.5–5.1)
Sodium: 137 mmol/L (ref 135–145)
Total Bilirubin: 0.7 mg/dL (ref 0.3–1.2)
Total Protein: 6.8 g/dL (ref 6.5–8.1)

## 2022-10-25 LAB — MAGNESIUM: Magnesium: 0.9 mg/dL — CL (ref 1.7–2.4)

## 2022-10-25 MED ORDER — SODIUM CHLORIDE 0.9 % IV BOLUS
1000.0000 mL | Freq: Once | INTRAVENOUS | Status: AC
  Administered 2022-10-25: 1000 mL via INTRAVENOUS

## 2022-10-25 MED ORDER — MAGNESIUM SULFATE 2 GM/50ML IV SOLN
2.0000 g | Freq: Once | INTRAVENOUS | Status: AC
  Administered 2022-10-25: 2 g via INTRAVENOUS
  Filled 2022-10-25: qty 50

## 2022-10-25 NOTE — ED Notes (Signed)
Discharge instructions reviewed with patient. Patient verbalizes understanding, no further questions at this time. Medications and follow up information provided. No acute distress noted at time of departure.  

## 2022-10-25 NOTE — Telephone Encounter (Signed)
Who Is Calling Patient / Member / Family / Caregiver Call Type Triage / Clinical Relationship To Patient Self Return Phone Number [phone] (Primary) Chief Complaint Paging or Request for Consult Reason for Call Request to Speak to a Physician Initial Comment Pt is returning a call to the office. Provider Percell Miller stated for her to go to ER. She had blood work taken today and something was off about her magnesium levels. She is calling to clarify. Translation No Nurse Assessment Nurse: Hassell Done, RN, Joelene Millin Date/Time Eilene Ghazi Time): 10/24/2022 5:25:35 PM Confirm and document reason for call. If symptomatic, describe symptoms. ---caller wanted clarification of her lab values. Does the patient have any new or worsening symptoms? ---No Disp. Time Eilene Ghazi Time) Disposition Final User 10/24/2022 5:26:08 PM Clinical Call Yes Hassell Done, RN, Joelene Millin

## 2022-10-25 NOTE — ED Triage Notes (Signed)
Pt started to take magnesium last night and this morning and called pcp to see if she still needed to come and have labs rechecked.

## 2022-10-25 NOTE — Discharge Instructions (Addendum)
Please remember to take your home magnesium as prescribed by your doctors office.

## 2022-10-25 NOTE — ED Triage Notes (Signed)
Pt was seen by PCP yesterday and was called to come to ER due to low magnesium. Pt has been taking magnesium at home but not the full amount as prescribed.

## 2022-10-25 NOTE — Telephone Encounter (Signed)
I did not see were they have went to an ER in epic system, so I call to follow up with them.  I advised about magnesium was low and them needed to go.    Spoke with wife and husband and the husband stated that patient usually take magnesium '400mg'$  tid but has not taken it in 2 weeks.  They wanted to know if she start taking as she should does she still need to go to the ER?

## 2022-10-25 NOTE — ED Provider Notes (Signed)
[facility] EMERGENCY DEPARTMENT Provider Note   CSN: [id number] Arrival date & time: 10/25/22  1107     History  Chief Complaint  Patient presents with   Abnormal Labs    Jasmine Keith is a 71 y.o. female presenting to the ED with hypomagnesemia.  Patient had blood test done yesterday and her magnesium level 0.8.  She was advised to come to the ER.  She reports she has not been taking her magnesium supplements at home for about 2 weeks, because she just got to the hospital for [facility] and a bypass.  Her husband states he gave her the magnesium again last night and she started retaking it.  She has chronic diarrhea issues.  Single loose bowel movement last night.  She otherwise feels normal, aside from generalized weakness from her recent hospitalizations.  HPI     Home Medications Prior to Admission medications   Medication Sig Start Date End Date Taking? Authorizing Provider  acetaminophen (TYLENOL) 325 MG tablet Take 2 tablets (650 mg total) by mouth every 6 (six) hours as needed for mild pain (or Fever >/= 101). 05/29/22   Swayze, Ava, DO  alendronate (FOSAMAX) 70 MG tablet TAKE 1 TABLET BY MOUTH ONCE A WEEK. TAKE WITH A FULL GLASS OF WATER ON AN EMPTY STOMACH. 06/13/22   Saguier, Percell Miller, PA-C  amLODipine (NORVASC) 10 MG tablet TAKE 1 TABLET BY MOUTH EVERY DAY 07/19/22   Saguier, Percell Miller, PA-C  aspirin EC 325 MG tablet Take 1 tablet (325 mg total) by mouth daily. 09/17/22   Barrett, Erin R, PA-C  busPIRone (BUSPAR) 7.5 MG tablet TAKE 1 TABLET BY MOUTH 2 TIMES DAILY. 09/18/22   Saguier, Percell Miller, PA-C  carvedilol (COREG) 6.25 MG tablet Take 1 tablet (6.25 mg total) by mouth 2 (two) times daily with a meal. 10/17/22 12/16/22  Saguier, Percell Miller, PA-C  Continuous Blood Gluc Receiver (FREESTYLE LIBRE 14 DAY READER) DEVI 1 kit by Does not apply route daily. 10/24/22   Saguier, Percell Miller, PA-C  Continuous Blood Gluc Sensor (FREESTYLE LIBRE 14 DAY SENSOR) MISC 1 kit by Does not apply  route daily. 10/24/22   Saguier, Percell Miller, PA-C  dicyclomine (BENTYL) 10 MG capsule Take 10 mg by mouth 4 (four) times daily -  before meals and at bedtime.    [provider]  fenofibrate 160 MG tablet TAKE 1 TABLET BY MOUTH EVERY DAY 07/19/22 10/17/22  Saguier, Percell Miller, PA-C  gabapentin (NEURONTIN) 300 MG capsule Take 1 capsule (300 mg total) by mouth 2 (two) times daily. 09/19/22   Barrett, Erin R, PA-C  hydrALAZINE (APRESOLINE) 100 MG tablet Take 1 tablet (100 mg total) by mouth 3 (three) times daily. 09/27/22 11/26/22  Elodia Florence., MD  insulin aspart (NOVOLOG) 100 UNIT/ML injection Inject 0-9 Units into the skin 3 (three) times daily with meals. CBG < 70:treat for low blood sugar  CBG 70 - 120: 0 units  CBG 121 - 150: 1 unit  CBG 151 - 200: 2 units  CBG 201 - 250: 3 units  CBG 251 - 300: 5 units  CBG 301 - 350: 7 units  CBG 351 - 400: 9 units  CBG > 400: call MD 09/27/22   Elodia Florence., MD  insulin detemir (LEVEMIR) 100 UNIT/ML injection Inject 0.07 mLs (7 Units total) into the skin at bedtime. Use 7 units daily as you transition to SNF, they can adjust this at the facility as needed. 09/27/22   Elodia Florence., MD  Insulin Pen Needle (BD PEN NEEDLE NANO U/F) 32G X 4 MM MISC Use one needle daily to inject insulin 12/23/21   Saguier, Percell Miller, PA-C  levothyroxine (SYNTHROID) 25 MCG tablet TAKE 1 TABLET BY MOUTH EVERY DAY BEFORE BREAKFAST 07/19/22   Elayne Snare, MD  meclizine (ANTIVERT) 25 MG tablet Take 1 tablet (25 mg total) by mouth 3 (three) times daily as needed for dizziness. 06/13/22   Damita Lack, MD  metFORMIN (GLUCOPHAGE) 1000 MG tablet Take 1 tablet (1,000 mg total) by mouth 2 (two) times daily with a meal. 12/23/21   Saguier, Percell Miller, PA-C  pantoprazole (PROTONIX) 40 MG tablet TAKE 1 TABLET BY MOUTH EVERY DAY 08/21/22   Saguier, Percell Miller, PA-C  sacubitril-valsartan (ENTRESTO) 49-51 MG Take 1 tablet by mouth 2 (two) times daily. 10/24/22   Saguier,  Percell Miller, PA-C  venlafaxine XR (EFFEXOR-XR) 75 MG 24 hr capsule TAKE 1 CAPSULE BY MOUTH DAILY WITH BREAKFAST. 01/03/22   Saguier, Percell Miller, PA-C  Vitamin D, Ergocalciferol, (DRISDOL) 1.25 MG (50000 UNIT) CAPS capsule TAKE 1 CAPSULE BY MOUTH ONE TIME PER WEEK 09/18/22   Saguier, Percell Miller, PA-C      Allergies    Lipitor [atorvastatin], Lovastatin, and Rosuvastatin    Review of Systems   Review of Systems  Physical Exam Updated Vital Signs BP (!) 125/54   Pulse 91   Temp 97.9 F (36.6 C) (Oral)   Resp 15   Ht _0  (1.549 m)   Wt 45.4 kg   SpO2 98%   BMI 18.89 kg/m  Physical Exam Constitutional:      General: She is not in acute distress. HENT:     Head: Normocephalic and atraumatic.  Eyes:     Conjunctiva/sclera: Conjunctivae normal.     Pupils: Pupils are equal, round, and reactive to light.  Cardiovascular:     Rate and Rhythm: Normal rate and regular rhythm.  Pulmonary:     Effort: Pulmonary effort is normal. No respiratory distress.  Abdominal:     General: There is no distension.     Tenderness: There is no abdominal tenderness.  Skin:    General: Skin is warm and dry.  Neurological:     General: No focal deficit present.     Mental Status: She is alert. Mental status is at baseline.  Psychiatric:        Mood and Affect: Mood normal.        Behavior: Behavior normal.     ED Results / Procedures / Treatments   Labs (all labs ordered are listed, but only abnormal results are displayed) Labs Reviewed  COMPREHENSIVE METABOLIC PANEL - Abnormal; Notable for the following components:      Result Value   CO2 20 (*)    Glucose, Bld 193 (*)    BUN 27 (*)    Creatinine, Ser 1.34 (*)    Calcium 8.8 (*)    Albumin 3.4 (*)    GFR, Estimated 43 (*)    All other components within normal limits  CBC WITH DIFFERENTIAL/PLATELET - Abnormal; Notable for the following components:   RBC 3.52 (*)    Hemoglobin 9.3 (*)    HCT 28.3 (*)    RDW 17.6 (*)    Platelets 414 (*)     All other components within normal limits  MAGNESIUM - Abnormal; Notable for the following components:   Magnesium 0.9 (*)    All other components within normal limits    EKG EKG Interpretation  Date/Time:  Wednesday October 25 2022 13:05:21 EST Ventricular Rate:  69 PR Interval:  136 QRS Duration: 82 QT Interval:  410 QTC Calculation: 440 R Axis:   -9 Text Interpretation: Sinus rhythm Probable LVH with secondary repol abnrm No significant change from prior tracing Confirmed by Octaviano Glow [phone]) on 10/25/2022 1:08:15 PM  Radiology No results found.  Procedures Procedures    Medications Ordered in ED Medications  sodium chloride 0.9 % bolus 1,000 mL (0 mLs Intravenous Stopped 10/25/22 1426)  magnesium sulfate IVPB 2 g 50 mL (0 g Intravenous Stopped 10/25/22 1426)    ED Course/ Medical Decision Making/ A&P Clinical Course as of 10/25/22 1532  Wed Oct 25, 2022  1307 Magnesium(!!): 0.9 [MT]    Clinical Course User Index [MT] Wyvonnia Dusky, MD                           Medical Decision Making Amount and/or Complexity of Data Reviewed Labs: ordered. Decision-making details documented in ED Course. ECG/medicine tests: ordered.  Risk Prescription drug management.   The patient is here with asymptomatic hypomagnesemia per my review of external records and outpatient magnesium levels, 0.8.  Will recheck today.  I personally viewed interpreted the patient's labs.  The remainder of her labs appear to be at baseline, there is a very minor uptick in her BUN and creatinine compared to yesterday, and she may not have had too much to drink last night.  I gave her a fluid bolus in addition to IV magnesium repletion.  I personally reviewed and interpreted her EKG, notable for no significant arrhythmia or changes  Otherwise at this time I do not see evidence of dangerous arrhythmia or indication for hospitalization.  Supplemental history is provided by the patient's  husband.  The patient was maintained on telemetry throughout her stay and maintained sinus rhythm.  She was stable on reassessment at the time of discharge.        Final Clinical Impression(s) / ED Diagnoses Final diagnoses:  Hypomagnesemia    Rx / DC Orders ED Discharge Orders     None         Wyvonnia Dusky, MD 10/25/22 [phone]

## 2022-10-25 NOTE — Telephone Encounter (Signed)
Per Percell Miller pt still needs to go to ER.  Can have heart arythmias and magnesium is super low, lower than her usual low.    Spoke with husband and advised and he will take her to ER now.

## 2022-10-27 ENCOUNTER — Telehealth: Payer: Self-pay | Admitting: Medical

## 2022-10-27 NOTE — Telephone Encounter (Signed)
[facility] nurse calling wanting to clarify the magnesium dosage for the patient to take at home. Please advise.  She can be reached at [phone].

## 2022-10-30 NOTE — Addendum Note (Signed)
Addended by: Anabel Halon on: 10/30/2022 06:41 AM   Modules accepted: Orders

## 2022-10-31 ENCOUNTER — Other Ambulatory Visit: Payer: Self-pay

## 2022-10-31 NOTE — Telephone Encounter (Signed)
HH called and lvm to return call

## 2022-11-01 ENCOUNTER — Encounter: Payer: Self-pay | Admitting: Medical

## 2022-11-01 ENCOUNTER — Encounter: Payer: Self-pay | Admitting: Cardiology

## 2022-11-01 ENCOUNTER — Ambulatory Visit: Payer: Medicare Other | Attending: Cardiology | Admitting: Cardiology

## 2022-11-01 VITALS — BP 130/58 | HR 80 | Ht 61.0 in | Wt 102.0 lb

## 2022-11-01 DIAGNOSIS — I2510 Atherosclerotic heart disease of native coronary artery without angina pectoris: Secondary | ICD-10-CM | POA: Diagnosis not present

## 2022-11-01 DIAGNOSIS — Z951 Presence of aortocoronary bypass graft: Secondary | ICD-10-CM | POA: Insufficient documentation

## 2022-11-01 DIAGNOSIS — E088 Diabetes mellitus due to underlying condition with unspecified complications: Secondary | ICD-10-CM | POA: Diagnosis not present

## 2022-11-01 DIAGNOSIS — E1100 Type 2 diabetes mellitus with hyperosmolarity without nonketotic hyperglycemic-hyperosmolar coma (NKHHC): Secondary | ICD-10-CM | POA: Insufficient documentation

## 2022-11-01 DIAGNOSIS — F17210 Nicotine dependence, cigarettes, uncomplicated: Secondary | ICD-10-CM | POA: Diagnosis not present

## 2022-11-01 NOTE — Addendum Note (Signed)
Addended by: Truddie Hidden on: 11/01/2022 01:37 PM   Modules accepted: Orders

## 2022-11-01 NOTE — Patient Instructions (Signed)
Medication Instructions:  Your physician recommends that you continue on your current medications as directed. Please refer to the Current Medication list given to you today.  *If you need a refill on your cardiac medications before your next appointment, please call your pharmacy*   Lab Work: Your physician recommends that you have a BMET and magnesium level done today in the office.  If you have labs (blood work) drawn today and your tests are completely normal, you will receive your results only by: [place] (if you have MyChart) OR A paper copy in the mail If you have any lab test that is abnormal or we need to change your treatment, we will call you to review the results.   Testing/Procedures: None ordered   Follow-Up: At [facility], you and your health needs are our priority.  As part of our continuing mission to provide you with exceptional heart care, we have created designated Provider Care Teams.  These Care Teams include your primary Cardiologist (physician) and Advanced Practice Providers (APPs -  Physician Assistants and Nurse Practitioners) who all work together to provide you with the care you need, when you need it.  We recommend signing up for the patient portal called "MyChart".  Sign up information is provided on this After Visit Summary.  MyChart is used to connect with patients for Virtual Visits (Telemedicine).  Patients are able to view lab/test results, encounter notes, upcoming appointments, etc.  Non-urgent messages can be sent to your provider as well.   To learn more about what you can do with MyChart, go to [email or web address].    Your next appointment:   1 month(s)  The format for your next appointment:   In Person  Provider:   Jyl Heinz, MD    Other Instructions none  Important Information About Sugar

## 2022-11-01 NOTE — Telephone Encounter (Signed)
Faxed paper to [facility] home
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
## 2022-11-02 ENCOUNTER — Telehealth: Payer: Self-pay | Admitting: Cardiology

## 2022-11-02 LAB — BASIC METABOLIC PANEL
BUN/Creatinine Ratio: 22 (ref 12–28)
BUN: 22 mg/dL (ref 8–27)
CO2: 18 mmol/L — ABNORMAL LOW (ref 20–29)
Calcium: 10 mg/dL (ref 8.7–10.3)
Chloride: 100 mmol/L (ref 96–106)
Creatinine, Ser: 0.98 mg/dL (ref 0.57–1.00)
Glucose: 188 mg/dL — ABNORMAL HIGH (ref 70–99)
Potassium: 4.3 mmol/L (ref 3.5–5.2)
Sodium: 137 mmol/L (ref 134–144)
eGFR: 62 mL/min/{1.73_m2} (ref >59)

## 2022-11-02 LAB — MAGNESIUM: Magnesium: 1.4 mg/dL — ABNORMAL LOW (ref 1.6–2.3)

## 2022-11-02 NOTE — Telephone Encounter (Signed)
Pt is asking to speak with RN again about her lab results (specifically the magnesium). The husband was also on the phone wanting to know what Jasmine Keith recommends. Please advise.

## 2022-11-02 NOTE — Addendum Note (Signed)
Addended by: Anabel Halon on: 11/02/2022 08:04 PM   Modules accepted: Orders

## 2022-11-02 NOTE — Telephone Encounter (Signed)
Patient informed of her lab results.

## 2022-11-03 ENCOUNTER — Telehealth: Payer: Self-pay | Admitting: Medical

## 2022-11-03 ENCOUNTER — Telehealth: Payer: Self-pay | Admitting: *Deleted

## 2022-11-03 NOTE — Progress Notes (Signed)
  Care Coordination Note  11/03/2022 Name: Jasmine Keith MRN: [id number] DOB: 1951-02-16  Jasmine Keith is a 71 y.o. year old female who is a primary care patient of Saguier, Iris Pert and is actively engaged with the care management team. I reached out to Jasmine Keith by phone today to assist with re-scheduling an initial visit with the RN Case Manager  Follow up plan: Patient declines further follow up and engagement by the care management team. Appropriate care team members and provider have been notified via electronic communication.  Pt says she already has home health nurse coming  Julian Hy, [place]: [phone]

## 2022-11-03 NOTE — Telephone Encounter (Signed)
VO given.

## 2022-11-03 NOTE — Telephone Encounter (Signed)
Caller/Agency: [facility])  Callback Number: [phone]  Requesting OT/PT/Skilled Nursing/Social Work/Speech Therapy: OT Frequency: 1x for 6 weeks

## 2022-11-07 ENCOUNTER — Telehealth: Payer: Self-pay | Admitting: Medical

## 2022-11-07 ENCOUNTER — Other Ambulatory Visit: Payer: Self-pay | Admitting: Medical

## 2022-11-07 DIAGNOSIS — E1100 Type 2 diabetes mellitus with hyperosmolarity without nonketotic hyperglycemic-hyperosmolar coma (NKHHC): Secondary | ICD-10-CM

## 2022-11-07 MED ORDER — FREESTYLE LIBRE 14 DAY READER DEVI: 1.0000 | Freq: Every day | 0 refills | Status: DC

## 2022-11-07 MED ORDER — BUSPIRONE HCL 7.5 MG PO TABS: 7.5000 mg | ORAL_TABLET | Freq: Two times a day (BID) | ORAL | 0 refills | Status: DC

## 2022-11-07 MED ORDER — DICYCLOMINE HCL 10 MG PO CAPS: 10.0000 mg | ORAL_CAPSULE | Freq: Three times a day (TID) | ORAL | 3 refills | Status: DC

## 2022-11-07 MED ORDER — FREESTYLE LIBRE 14 DAY SENSOR MISC: 1.0000 | Freq: Every day | 0 refills | Status: DC

## 2022-11-07 NOTE — Telephone Encounter (Signed)
Rx sent 

## 2022-11-07 NOTE — Telephone Encounter (Signed)
Prescription Request  11/07/2022  Is this a "Controlled Substance" medicine? No  LOV: 10/24/2022  What is the name of the medication or equipment?   busPIRone (BUSPAR) 7.5 MG tablet [[id number]]   dicyclomine (BENTYL) 10 MG capsule [[id number]]   Have you contacted your pharmacy to request a refill? Yes   Which pharmacy would you like this sent to?   [id number] [place] [id number]: [phone] Fax: [phone] Patient notified that their request is being sent to the clinical staff for review and that they should receive a response within 2 business days.   Please advise at Mobile [phone](mobile)

## 2022-11-07 NOTE — Telephone Encounter (Signed)
Pt called stating that the pharmacy had told her that a diagnosis code was required on the glucometer that was sent in. Medicare will not cover it without that code attached.

## 2022-11-13 ENCOUNTER — Other Ambulatory Visit: Payer: Self-pay | Admitting: Thoracic Surgery (Cardiothoracic Vascular Surgery)

## 2022-11-13 DIAGNOSIS — Z951 Presence of aortocoronary bypass graft: Secondary | ICD-10-CM

## 2022-11-13 NOTE — Progress Notes (Deleted)
[address]       [place] [address]             [phone]    HPI: Patient returns for routine postoperative follow-up having undergone CABG x 3 on 09/04/2022. The patient's early postoperative recovery while in the hospital was notable for difficult to control HTN.  She ultimately was discharged home on 5 agents.  However she required readmission to the hospital on 11/16 for hypotension and bradycardia with AMS and poor oral intake.  She was admitted to the ICU for shock and started on Epinephrine.  She was required transfusion for low Hgb.  She was hypoglycemic and her insulin regimen was adjusted.  She was restarted on home antihypertensive agents.  She was stable for discharge home on 10/27/2022.  The patient presents today for post operative follow up.  Since hospital discharge the patient reports ***.   Current Outpatient Medications  Medication Sig Dispense Refill   acetaminophen (TYLENOL) 325 MG tablet Take 2 tablets (650 mg total) by mouth every 6 (six) hours as needed for mild pain (or Fever >/= 101). 60 tablet 0   alendronate (FOSAMAX) 70 MG tablet TAKE 1 TABLET BY MOUTH ONCE A WEEK. TAKE WITH A FULL GLASS OF WATER ON AN EMPTY STOMACH. 12 tablet 3   amLODipine (NORVASC) 10 MG tablet TAKE 1 TABLET BY MOUTH EVERY DAY 90 tablet 0   aspirin EC 325 MG tablet Take 1 tablet (325 mg total) by mouth daily.     busPIRone (BUSPAR) 7.5 MG tablet Take 1 tablet (7.5 mg total) by mouth 2 (two) times daily. 180 tablet 0   carvedilol (COREG) 6.25 MG tablet Take 1 tablet (6.25 mg total) by mouth 2 (two) times daily with a meal. 60 tablet 1   Continuous Blood Gluc Receiver (FREESTYLE LIBRE 2 READER) DEVI USE AS DIRECTED 1 each 0   Continuous Blood Gluc Sensor (FREESTYLE LIBRE 14 DAY SENSOR) MISC 1 kit by Does not apply route daily. 2 each 0   dicyclomine (BENTYL) 10 MG capsule Take 1 capsule (10 mg total) by mouth 4 (four) times daily -  before meals and at bedtime. 90 capsule  3   fenofibrate 160 MG tablet Take 160 mg by mouth daily.     gabapentin (NEURONTIN) 300 MG capsule Take 1 capsule (300 mg total) by mouth 2 (two) times daily. 60 capsule 0   hydrALAZINE (APRESOLINE) 100 MG tablet Take 1 tablet (100 mg total) by mouth 3 (three) times daily. 90 tablet 1   insulin aspart (NOVOLOG) 100 UNIT/ML injection Inject 0-9 Units into the skin 3 (three) times daily with meals. CBG < 70:treat for low blood sugar  CBG 70 - 120: 0 units  CBG 121 - 150: 1 unit  CBG 151 - 200: 2 units  CBG 201 - 250: 3 units  CBG 251 - 300: 5 units  CBG 301 - 350: 7 units  CBG 351 - 400: 9 units  CBG > 400: call MD 10 mL 11   insulin detemir (LEVEMIR) 100 UNIT/ML injection Inject 0.07 mLs (7 Units total) into the skin at bedtime. Use 7 units daily as you transition to SNF, they can adjust this at the facility as needed. 10 mL 11   Insulin Pen Needle (BD PEN NEEDLE NANO U/F) 32G X 4 MM MISC Use one needle daily to inject insulin 30 each 5   levothyroxine (SYNTHROID) 25 MCG tablet TAKE 1 TABLET BY MOUTH  EVERY DAY BEFORE BREAKFAST 90 tablet 0   meclizine (ANTIVERT) 25 MG tablet Take 1 tablet (25 mg total) by mouth 3 (three) times daily as needed for dizziness. 30 tablet 0   metFORMIN (GLUCOPHAGE) 1000 MG tablet Take 1 tablet (1,000 mg total) by mouth 2 (two) times daily with a meal. 180 tablet 3   pantoprazole (PROTONIX) 40 MG tablet TAKE 1 TABLET BY MOUTH EVERY DAY 90 tablet 1   sacubitril-valsartan (ENTRESTO) 49-51 MG Take 1 tablet by mouth 2 (two) times daily. 60 tablet 3   venlafaxine XR (EFFEXOR-XR) 75 MG 24 hr capsule TAKE 1 CAPSULE BY MOUTH DAILY WITH BREAKFAST. 90 capsule 2   Vitamin D, Ergocalciferol, (DRISDOL) 1.25 MG (50000 UNIT) CAPS capsule TAKE 1 CAPSULE BY MOUTH ONE TIME PER WEEK 8 capsule 0   No current facility-administered medications for this visit.    Physical Exam: ***  Diagnostic Tests: ***  A/P:  S/P CABG x 3 DM-previous hypoglycemia-insulin adjusted HTN-   Nicotine abuse  Ellwood Handler, PA-C [facility] [phone]

## 2022-11-13 NOTE — Patient Instructions (Incomplete)
You are encouraged to enroll and participate in the outpatient cardiac rehab program beginning as soon as practical.  Do not resume smoking cigarettes or any other tobacco products.  Make every effort to keep your diabetes under very tight control.  Follow up closely with your primary care physician or endocrinologist and strive to keep their hemoglobin A1c levels as low as possible, preferably near or below 6.0.  The long term benefits of strict control of diabetes are far reaching and critically important for your overall health and survival.  You may return to driving an automobile as long as you are no longer requiring oral narcotic pain relievers during the daytime.  It would be wise to start driving only short distances during the daylight and gradually increase from there as you feel comfortable.  Make every effort to maintain a "heart-healthy" lifestyle with regular physical exercise and adherence to a low-fat, low-carbohydrate diet.  Continue to seek regular follow-up appointments with your primary care physician and/or cardiologist.  You may continue to gradually increase your physical activity as tolerated.  Refrain from any heavy lifting or strenuous use of your arms and shoulders until at least 8 weeks from the time of your surgery, and avoid activities that cause increased pain in your chest on the side of your surgical incision.  Otherwise you may continue to increase activities without any particular limitations.  Increase the intensity and duration of physical activity gradually.

## 2022-11-14 ENCOUNTER — Encounter: Payer: Self-pay | Admitting: *Deleted

## 2022-11-14 ENCOUNTER — Other Ambulatory Visit: Payer: Self-pay | Admitting: Thoracic Surgery (Cardiothoracic Vascular Surgery)

## 2022-11-14 ENCOUNTER — Ambulatory Visit: Payer: Medicare Other

## 2022-11-14 DIAGNOSIS — Z951 Presence of aortocoronary bypass graft: Secondary | ICD-10-CM

## 2022-11-15 ENCOUNTER — Ambulatory Visit
Admission: RE | Admit: 2022-11-15 | Discharge: 2022-11-15 | Disposition: A | Discharge status: Home / Self Care | Payer: Medicare Other | Source: Ambulatory Visit | Attending: Thoracic Surgery (Cardiothoracic Vascular Surgery) | Admitting: Thoracic Surgery (Cardiothoracic Vascular Surgery)

## 2022-11-15 ENCOUNTER — Encounter: Payer: Self-pay | Admitting: Physician Assistant

## 2022-11-15 ENCOUNTER — Ambulatory Visit (INDEPENDENT_AMBULATORY_CARE_PROVIDER_SITE_OTHER): Payer: Self-pay | Admitting: Physician Assistant

## 2022-11-15 VITALS — BP 122/63 | HR 76 | Resp 20 | Ht 61.0 in | Wt 101.0 lb

## 2022-11-15 DIAGNOSIS — Z951 Presence of aortocoronary bypass graft: Secondary | ICD-10-CM

## 2022-11-15 DIAGNOSIS — I2510 Atherosclerotic heart disease of native coronary artery without angina pectoris: Secondary | ICD-10-CM

## 2022-11-15 NOTE — Progress Notes (Signed)
[address]       [place] [address]             [phone]       HPI: Jasmine Keith is a 72 year old female with past history of hypertension, type 2 diabetes mellitus, hypothyroidism, and abdominal aortic aneurysm.  She was referred to Dr. Kipp Brood back in October 2023 for surgical management of her coronary artery disease with symptoms of angina and dyspnea on exertion.  She underwent elective coronary bypass grafting x 3 on 09/04/2022.  Her postoperative course was complicated by delirium and hypertension.  She was on multiple agents while in the hospital including clonidine, losartan, hydralazine, metoprolol, and amlodipine.  We consulted with cardiology for assistance with management.  She was eventually discharged home on Entresto and carvedilol.  A few days after discharge, she was mated to the hospital for bradycardia.  Medications were adjusted.  She was noted to have significant weakness making ambulation and self-care extremely difficult.  For this reason, she was discharged to skilled nursing facility for additional rehab.  While at that facility, she developed COVID further complicating her recovery.  She has since been discharged back to home.  She has been seen in follow-up by Dr. Geraldo Pitter on 2 occasions since surgery.  She is currently receiving home health nursing and home health physical therapy.  She feels that she is progressing slowly and 100% better than when she left the hospital".  She continues to have some weakness and fatigues very easily but she said this is getting better slowly.      Current Outpatient Medications  Medication Sig Dispense Refill   acetaminophen (TYLENOL) 325 MG tablet Take 2 tablets (650 mg total) by mouth every 6 (six) hours as needed for mild pain (or Fever >/= 101). 60 tablet 0   alendronate (FOSAMAX) 70 MG tablet TAKE 1 TABLET BY MOUTH ONCE A WEEK. TAKE WITH A FULL GLASS OF WATER ON AN EMPTY STOMACH. 12 tablet 3    amLODipine (NORVASC) 10 MG tablet TAKE 1 TABLET BY MOUTH EVERY DAY 90 tablet 0   aspirin EC 325 MG tablet Take 1 tablet (325 mg total) by mouth daily.     busPIRone (BUSPAR) 7.5 MG tablet Take 1 tablet (7.5 mg total) by mouth 2 (two) times daily. 180 tablet 0   carvedilol (COREG) 6.25 MG tablet Take 1 tablet (6.25 mg total) by mouth 2 (two) times daily with a meal. 60 tablet 1   Continuous Blood Gluc Receiver (FREESTYLE LIBRE 2 READER) DEVI USE AS DIRECTED 1 each 0   Continuous Blood Gluc Sensor (FREESTYLE LIBRE 14 DAY SENSOR) MISC 1 kit by Does not apply route daily. 2 each 0   dicyclomine (BENTYL) 10 MG capsule Take 1 capsule (10 mg total) by mouth 4 (four) times daily -  before meals and at bedtime. 90 capsule 3   fenofibrate 160 MG tablet Take 160 mg by mouth daily.     gabapentin (NEURONTIN) 300 MG capsule Take 1 capsule (300 mg total) by mouth 2 (two) times daily. 60 capsule 0   hydrALAZINE (APRESOLINE) 100 MG tablet Take 1 tablet (100 mg total) by mouth 3 (three) times daily. 90 tablet 1   insulin aspart (NOVOLOG) 100 UNIT/ML injection Inject 0-9 Units into the skin 3 (three) times daily with meals. CBG < 70:treat for low blood sugar  CBG 70 - 120: 0 units  CBG 121 - 150: 1 unit  CBG 151 -  200: 2 units  CBG 201 - 250: 3 units  CBG 251 - 300: 5 units  CBG 301 - 350: 7 units  CBG 351 - 400: 9 units  CBG > 400: call MD 10 mL 11   insulin detemir (LEVEMIR) 100 UNIT/ML injection Inject 0.07 mLs (7 Units total) into the skin at bedtime. Use 7 units daily as you transition to SNF, they can adjust this at the facility as needed. 10 mL 11   Insulin Pen Needle (BD PEN NEEDLE NANO U/F) 32G X 4 MM MISC Use one needle daily to inject insulin 30 each 5   levothyroxine (SYNTHROID) 25 MCG tablet TAKE 1 TABLET BY MOUTH EVERY DAY BEFORE BREAKFAST 90 tablet 0   meclizine (ANTIVERT) 25 MG tablet Take 1 tablet (25 mg total) by mouth 3 (three) times daily as needed for dizziness. 30 tablet 0   metFORMIN  (GLUCOPHAGE) 1000 MG tablet Take 1 tablet (1,000 mg total) by mouth 2 (two) times daily with a meal. 180 tablet 3   pantoprazole (PROTONIX) 40 MG tablet TAKE 1 TABLET BY MOUTH EVERY DAY 90 tablet 1   sacubitril-valsartan (ENTRESTO) 49-51 MG Take 1 tablet by mouth 2 (two) times daily. 60 tablet 3   venlafaxine XR (EFFEXOR-XR) 75 MG 24 hr capsule TAKE 1 CAPSULE BY MOUTH DAILY WITH BREAKFAST. 90 capsule 2   Vitamin D, Ergocalciferol, (DRISDOL) 1.25 MG (50000 UNIT) CAPS capsule TAKE 1 CAPSULE BY MOUTH ONE TIME PER WEEK 8 capsule 0   No current facility-administered medications for this visit.    Physical Exam: Vital signs BP 122/63 Pulse 76 Respirations 20 SpO2 100% on room air  General able to stand and walk unassisted.  Gait is slow but steady. Heart: Regular rate and rhythm Chest: Breath sounds are full, equal, and clear to auscultation.  Sternotomy incision is well-healed.  Sternum is stable.  Extremities: No peripheral edema.  The bilateral EVH incisions are intact and healing with no sign of complication. Neuro: Intact  Diagnostic Tests: CLINICAL DATA:  Postop CABG.   EXAM: CHEST - 2 VIEW   COMPARISON:  Radiographs 09/24/2022, 09/22/2022 and 09/21/2022.   FINDINGS: The heart size and mediastinal contours are stable status post median sternotomy and CABG. There is aortic atherosclerosis. Minimal residual linear atelectasis in the left mid lung. The lungs are otherwise clear. No pleural effusion or pneumothorax. The bones appear unremarkable.   IMPRESSION: Minimal residual linear atelectasis in the left mid lung. No acute cardiopulmonary process.     Electronically Signed   By: Richardean Sale M.D.   On: 11/15/2022 13:43    Impression / Plan: [name] has had a complicated course since her discharge from the hospital following coronary bypass grafting on 09/04/2022.  This is included significant bradycardia associated with weakness, hypomagnesemia, and COVID  infection.  Skilled nursing or rehab facility, she is back home now and gradually improving.  She continues to have some weakness and fatigues quite easily but she says this has improved weak she complaints or concerns.  Blood pressure appears to be well-controlled on current regimen.  Medications were reviewed and no changes are indicated from CT surgery standpoint.  All the incisions are well-healed. She would like to participate in cardiac rehab once she has finished the treatment plan with the home physical therapist.  She may begin that program at any time from our standpoint.  Follow-up as needed.     Antony Odea, PA-C [facility] [phone]

## 2022-11-15 NOTE — Patient Instructions (Signed)
You may advance activity without limitation  Okay to proceed with cardiac rehab.  Follow-up as needed.

## 2022-11-16 ENCOUNTER — Telehealth: Payer: Self-pay | Admitting: Medical

## 2022-11-16 MED ORDER — CARVEDILOL 6.25 MG PO TABS: 6.2500 mg | ORAL_TABLET | Freq: Two times a day (BID) | ORAL | 1 refills | Status: DC

## 2022-11-16 MED ORDER — GABAPENTIN 300 MG PO CAPS: 300.0000 mg | ORAL_CAPSULE | Freq: Two times a day (BID) | ORAL | 0 refills | Status: DC

## 2022-11-16 MED ORDER — BD PEN NEEDLE NANO U/F 32G X 4 MM MISC: 5 refills | Status: AC

## 2022-11-16 NOTE — Telephone Encounter (Signed)
Rx sent 

## 2022-11-16 NOTE — Telephone Encounter (Signed)
Prescription Request  11/16/2022  Is this a "Controlled Substance" medicine? No  LOV: 10/24/2022  What is the name of the medication or equipment? Insulin Pen Needle (BD PEN NEEDLE NANO U/F) 32G X 4 MM MISC   gabapentin (NEURONTIN) 300 MG capsule   carvedilol (COREG) 6.25 MG tablet    Have you contacted your pharmacy to request a refill? No   Which pharmacy would you like this sent to?  [id number] [place] [id number]: [phone] Fax: [phone]     Patient notified that their request is being sent to the clinical staff for review and that they should receive a response within 2 business days.   Please advise at Mobile [phone](mobile)

## 2022-11-17 ENCOUNTER — Telehealth (HOSPITAL_COMMUNITY): Payer: Self-pay

## 2022-11-17 ENCOUNTER — Other Ambulatory Visit: Payer: Self-pay

## 2022-11-17 NOTE — Progress Notes (Signed)
Referral placed for outpatient Cardiac rehab at [facility] per Dr. Abran Duke orders.

## 2022-11-17 NOTE — Telephone Encounter (Signed)
Referral faxed to [facility].

## 2022-11-18 ENCOUNTER — Other Ambulatory Visit: Payer: Self-pay | Admitting: Medical

## 2022-11-24 ENCOUNTER — Other Ambulatory Visit: Payer: Self-pay | Admitting: Medical

## 2022-11-24 DIAGNOSIS — E1100 Type 2 diabetes mellitus with hyperosmolarity without nonketotic hyperglycemic-hyperosmolar coma (NKHHC): Secondary | ICD-10-CM

## 2022-11-30 ENCOUNTER — Other Ambulatory Visit: Payer: Self-pay

## 2022-12-05 ENCOUNTER — Ambulatory Visit (INDEPENDENT_AMBULATORY_CARE_PROVIDER_SITE_OTHER): Payer: Medicare Other | Admitting: Medical

## 2022-12-05 VITALS — BP 138/48 | HR 72 | Temp 98.0°F | Resp 18 | Ht 61.0 in | Wt 105.0 lb

## 2022-12-05 DIAGNOSIS — M25532 Pain in left wrist: Secondary | ICD-10-CM

## 2022-12-05 DIAGNOSIS — S5290XA Unspecified fracture of unspecified forearm, initial encounter for closed fracture: Secondary | ICD-10-CM | POA: Diagnosis not present

## 2022-12-05 DIAGNOSIS — E1100 Type 2 diabetes mellitus with hyperosmolarity without nonketotic hyperglycemic-hyperosmolar coma (NKHHC): Secondary | ICD-10-CM

## 2022-12-05 DIAGNOSIS — I2510 Atherosclerotic heart disease of native coronary artery without angina pectoris: Secondary | ICD-10-CM | POA: Diagnosis not present

## 2022-12-05 DIAGNOSIS — K219 Gastro-esophageal reflux disease without esophagitis: Secondary | ICD-10-CM

## 2022-12-05 MED ORDER — FREESTYLE LIBRE 14 DAY SENSOR MISC: 1.0000 | Freq: Every day | 11 refills | Status: DC

## 2022-12-05 MED ORDER — PANTOPRAZOLE SODIUM 40 MG PO TBEC: 40.0000 mg | DELAYED_RELEASE_TABLET | Freq: Every day | ORAL | 1 refills | Status: DC

## 2022-12-05 NOTE — Patient Instructions (Addendum)
For diabetes, we will get A1c and CMP today.  Continue current Levemir 7 units in the a.m. and sliding scale NovoLog before meals.  I would recommend that you eat small snack at 4 PM daily since you report consistent low sugar events around 6 PM.  If in the future your sugar levels become more erratic with either excessively high sugar levels or low sugar levels then would consider referral to endocrinologist.  For low magnesium, recheck magnesium level today.  For GERD refilled Protonix.  Follow-up date to be determined after lab review.
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
## 2022-12-06 ENCOUNTER — Ambulatory Visit: Payer: Medicare Other | Attending: Cardiology | Admitting: Cardiology

## 2022-12-06 ENCOUNTER — Encounter: Payer: Self-pay | Admitting: Cardiology

## 2022-12-06 VITALS — BP 123/56 | HR 66 | Ht 61.0 in | Wt 104.4 lb

## 2022-12-06 DIAGNOSIS — Z951 Presence of aortocoronary bypass graft: Secondary | ICD-10-CM | POA: Diagnosis not present

## 2022-12-06 DIAGNOSIS — E782 Mixed hyperlipidemia: Secondary | ICD-10-CM | POA: Diagnosis present

## 2022-12-06 DIAGNOSIS — F17210 Nicotine dependence, cigarettes, uncomplicated: Secondary | ICD-10-CM | POA: Diagnosis not present

## 2022-12-06 DIAGNOSIS — I2510 Atherosclerotic heart disease of native coronary artery without angina pectoris: Secondary | ICD-10-CM | POA: Diagnosis not present

## 2022-12-06 DIAGNOSIS — E088 Diabetes mellitus due to underlying condition with unspecified complications: Secondary | ICD-10-CM

## 2022-12-06 LAB — COMPREHENSIVE METABOLIC PANEL
AG Ratio: 1.3 (calc) (ref 1.0–2.5)
ALT: 10 U/L (ref 6–29)
AST: 21 U/L (ref 10–35)
Albumin: 4.1 g/dL (ref 3.6–5.1)
Alkaline phosphatase (APISO): 35 U/L — ABNORMAL LOW (ref 37–153)
BUN/Creatinine Ratio: 22 (calc) (ref 6–22)
BUN: 29 mg/dL — ABNORMAL HIGH (ref 7–25)
CO2: 24 mmol/L (ref 20–32)
Calcium: 10.2 mg/dL (ref 8.6–10.4)
Chloride: 105 mmol/L (ref 98–110)
Creat: 1.31 mg/dL — ABNORMAL HIGH (ref 0.60–1.00)
Globulin: 3.1 g/dL (calc) (ref 1.9–3.7)
Glucose, Bld: 76 mg/dL (ref 65–99)
Potassium: 5.1 mmol/L (ref 3.5–5.3)
Sodium: 137 mmol/L (ref 135–146)
Total Bilirubin: 0.4 mg/dL (ref 0.2–1.2)
Total Protein: 7.2 g/dL (ref 6.1–8.1)

## 2022-12-06 LAB — HEMOGLOBIN A1C
Hgb A1c MFr Bld: 5.9 % of total Hgb — ABNORMAL HIGH (ref <5.7)
Mean Plasma Glucose: 123 mg/dL
eAG (mmol/L): 6.8 mmol/L

## 2022-12-06 LAB — MAGNESIUM: Magnesium: 1.5 mg/dL (ref 1.5–2.5)

## 2022-12-06 MED ORDER — VALSARTAN 80 MG PO TABS: 80.0000 mg | ORAL_TABLET | Freq: Every day | ORAL | 0 refills | Status: DC

## 2022-12-06 NOTE — Patient Instructions (Signed)
Medication Instructions:  Your physician has recommended you make the following change in your medication:  Start Aspirin 81 mg once daily, stop the '325mg'$  Finish taking Entresto/Valsartan then Start Valsartan 80 mg once daily After taking Valsartan for 1 week check and record BP & Pulse twice a day for 1 week and drop off at office *If you need a refill on your cardiac medications before your next appointment, please call your pharmacy*   Lab Work: Your physician recommends that you return for lab work in: Today for Lipid Panel and Liver Function  If you have labs (blood work) drawn today and your tests are completely normal, you will receive your results only by: [place] (if you have MyChart) OR A paper copy in the mail If you have any lab test that is abnormal or we need to change your treatment, we will call you to review the results.   Testing/Procedures: NONE   Follow-Up: At [facility], you and your health needs are our priority.  As part of our continuing mission to provide you with exceptional heart care, we have created designated Provider Care Teams.  These Care Teams include your primary Cardiologist (physician) and Advanced Practice Providers (APPs -  Physician Assistants and Nurse Practitioners) who all work together to provide you with the care you need, when you need it.  We recommend signing up for the patient portal called "MyChart".  Sign up information is provided on this After Visit Summary.  MyChart is used to connect with patients for Virtual Visits (Telemedicine).  Patients are able to view lab/test results, encounter notes, upcoming appointments, etc.  Non-urgent messages can be sent to your provider as well.   To learn more about what you can do with MyChart, go to [email or web address].    Your next appointment:   9 month(s)  Provider:   Jyl Heinz, MD   Other Instructions
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
## 2022-12-07 LAB — LIPID PANEL
Chol/HDL Ratio: 4.1 ratio (ref 0.0–4.4)
Cholesterol, Total: 199 mg/dL (ref 100–199)
HDL: 48 mg/dL (ref >39)
LDL Chol Calc (NIH): 124 mg/dL — ABNORMAL HIGH (ref 0–99)
Triglycerides: 152 mg/dL — ABNORMAL HIGH (ref 0–149)
VLDL Cholesterol Cal: 27 mg/dL (ref 5–40)

## 2022-12-07 LAB — HEPATIC FUNCTION PANEL
ALT: 12 IU/L (ref 0–32)
AST: 23 IU/L (ref 0–40)
Albumin: 4.5 g/dL (ref 3.9–4.9)
Alkaline Phosphatase: 44 IU/L (ref 44–121)
Bilirubin Total: 0.3 mg/dL (ref 0.0–1.2)
Bilirubin, Direct: 0.1 mg/dL (ref 0.00–0.40)
Total Protein: 7.2 g/dL (ref 6.0–8.5)

## 2022-12-13 ENCOUNTER — Other Ambulatory Visit: Payer: Self-pay | Admitting: Medical

## 2022-12-14 ENCOUNTER — Other Ambulatory Visit: Payer: Self-pay | Admitting: Medical

## 2022-12-15 ENCOUNTER — Telehealth: Payer: Self-pay

## 2022-12-15 DIAGNOSIS — Z951 Presence of aortocoronary bypass graft: Secondary | ICD-10-CM

## 2022-12-15 DIAGNOSIS — E782 Mixed hyperlipidemia: Secondary | ICD-10-CM

## 2022-12-15 DIAGNOSIS — I2510 Atherosclerotic heart disease of native coronary artery without angina pectoris: Secondary | ICD-10-CM

## 2022-12-15 MED ORDER — NEXLIZET 180-10 MG PO TABS: 1.0000 | ORAL_TABLET | Freq: Every day | ORAL | 12 refills | Status: DC

## 2022-12-15 NOTE — Telephone Encounter (Signed)
-----   Message from Jenean Lindau, MD sent at 12/14/2022  8:17 PM EST ----- Nexlizet and ll in 6wks. Diet  ----- Message ----- From: Truddie Hidden, RN Sent: 12/14/2022   3:17 PM EST To: Jenean Lindau, MD  Pt has tried Atorvastatin, Rosuvastatin and lovastatin. Please advise. ----- Message ----- From: Jenean Lindau, MD Sent: 12/07/2022   4:05 PM EST To: Truddie Hidden, RN  Please check if patient is on statin therapy.  If not rosuvastatin 10 mg daily and liver lipid check in 6 weeks.  Copy primary care Jenean Lindau, MD 12/07/2022 4:04 PM

## 2022-12-16 ENCOUNTER — Other Ambulatory Visit: Payer: Self-pay | Admitting: Medical

## 2022-12-28 ENCOUNTER — Telehealth: Payer: Self-pay

## 2022-12-28 NOTE — Telephone Encounter (Signed)
PA submitted on CMM for Nexlizet 180-10 mg. Key [id number]

## 2023-01-09 ENCOUNTER — Telehealth: Payer: Self-pay | Admitting: Medical

## 2023-01-09 NOTE — Telephone Encounter (Signed)
Contacted Jasmine Keith to schedule their annual wellness visit. Appointment made for 01/22/2023.  Sherol Dade; Care Guide [facility] Direct Dial: [phone]

## 2023-01-14 ENCOUNTER — Other Ambulatory Visit: Payer: Self-pay | Admitting: Physician Assistant

## 2023-01-15 NOTE — Telephone Encounter (Signed)
PA denied for Nexlizet 180-10 mg due to not meeting the criteria for indications for medicare.

## 2023-01-19 ENCOUNTER — Other Ambulatory Visit: Payer: Self-pay | Admitting: Medical

## 2023-01-22 ENCOUNTER — Ambulatory Visit (INDEPENDENT_AMBULATORY_CARE_PROVIDER_SITE_OTHER): Payer: Medicare Other | Admitting: *Deleted

## 2023-01-22 ENCOUNTER — Other Ambulatory Visit: Payer: Self-pay

## 2023-01-22 ENCOUNTER — Other Ambulatory Visit: Payer: Self-pay | Admitting: Medical

## 2023-01-22 ENCOUNTER — Ambulatory Visit (HOSPITAL_BASED_OUTPATIENT_CLINIC_OR_DEPARTMENT_OTHER)
Admission: RE | Admit: 2023-01-22 | Discharge: 2023-01-22 | Disposition: A | Discharge status: Home / Self Care | Payer: Medicare Other | Source: Ambulatory Visit | Attending: Medical | Admitting: Medical

## 2023-01-22 VITALS — BP 124/63 | HR 62 | Ht 61.0 in | Wt 106.4 lb

## 2023-01-22 DIAGNOSIS — Z1231 Encounter for screening mammogram for malignant neoplasm of breast: Secondary | ICD-10-CM | POA: Diagnosis not present

## 2023-01-22 DIAGNOSIS — Z780 Asymptomatic menopausal state: Secondary | ICD-10-CM

## 2023-01-22 DIAGNOSIS — M25532 Pain in left wrist: Secondary | ICD-10-CM

## 2023-01-22 DIAGNOSIS — Z0000 Encounter for general adult medical examination without abnormal findings: Secondary | ICD-10-CM | POA: Diagnosis not present

## 2023-01-22 DIAGNOSIS — Z122 Encounter for screening for malignant neoplasm of respiratory organs: Secondary | ICD-10-CM

## 2023-01-22 DIAGNOSIS — Z87891 Personal history of nicotine dependence: Secondary | ICD-10-CM

## 2023-01-22 NOTE — Patient Instructions (Signed)
Ms. Langager , Thank you for taking time to come for your Medicare Wellness Visit. I appreciate your ongoing commitment to your health goals. Please review the following plan we discussed and let me know if I can assist you in the future.   These are the goals we discussed:  Goals      Patient Stated     Maintain health        This is a list of the screening recommended for you and due dates:  Health Maintenance  Topic Date Due   Pneumonia Vaccine (1 of 2 - PCV) Never done   Complete foot exam   Never done   Eye exam for diabetics  Never done   Yearly kidney health urinalysis for diabetes  Never done   Hepatitis C Screening: USPSTF Recommendation to screen - Ages 38-79 yo.  Never done   DTaP/Tdap/Td vaccine (1 - Tdap) Never done   Screening for Lung Cancer  Never done   Mammogram  Never done   Zoster (Shingles) Vaccine (1 of 2) Never done   COVID-19 Vaccine (4 - 2023-24 season) 07/07/2022   Hemoglobin A1C  06/05/2023   Yearly kidney function blood test for diabetes  12/06/2023   Medicare Annual Wellness Visit  01/22/2024   Colon Cancer Screening  03/18/2028   Flu Shot  Completed   DEXA scan (bone density measurement)  Completed   HPV Vaccine  Aged Out     Next appointment: Follow up in one year for your annual wellness visit.   Preventive Care 9 Years and Older, Female Preventive care refers to lifestyle choices and visits with your health care provider that can promote health and wellness. What does preventive care include? A yearly physical exam. This is also called an annual well check. Dental exams once or twice a year. Routine eye exams. Ask your health care provider how often you should have your eyes checked. Personal lifestyle choices, including: Daily care of your teeth and gums. Regular physical activity. Eating a healthy diet. Avoiding tobacco and drug use. Limiting alcohol use. Practicing safe sex. Taking low-dose aspirin every day. Taking vitamin  and mineral supplements as recommended by your health care provider. What happens during an annual well check? The services and screenings done by your health care provider during your annual well check will depend on your age, overall health, lifestyle risk factors, and family history of disease. Counseling  Your health care provider may ask you questions about your: Alcohol use. Tobacco use. Drug use. Emotional well-being. Home and relationship well-being. Sexual activity. Eating habits. History of falls. Memory and ability to understand (cognition). Work and work Statistician. Reproductive health. Screening  You may have the following tests or measurements: Height, weight, and BMI. Blood pressure. Lipid and cholesterol levels. These may be checked every 5 years, or more frequently if you are over 61 years old. Skin check. Lung cancer screening. You may have this screening every year starting at age 79 if you have a 30-pack-year history of smoking and currently smoke or have quit within the past 15 years. Fecal occult blood test (FOBT) of the stool. You may have this test every year starting at age 9. Flexible sigmoidoscopy or colonoscopy. You may have a sigmoidoscopy every 5 years or a colonoscopy every 10 years starting at age 73. Hepatitis C blood test. Hepatitis B blood test. Sexually transmitted disease (STD) testing. Diabetes screening. This is done by checking your blood sugar (glucose) after you have not eaten for  a while (fasting). You may have this done every 1-3 years. Bone density scan. This is done to screen for osteoporosis. You may have this done starting at age 56. Mammogram. This may be done every 1-2 years. Talk to your health care provider about how often you should have regular mammograms. Talk with your health care provider about your test results, treatment options, and if necessary, the need for more tests. Vaccines  Your health care provider may recommend  certain vaccines, such as: Influenza vaccine. This is recommended every year. Tetanus, diphtheria, and acellular pertussis (Tdap, Td) vaccine. You may need a Td booster every 10 years. Zoster vaccine. You may need this after age 39. Pneumococcal 13-valent conjugate (PCV13) vaccine. One dose is recommended after age 55. Pneumococcal polysaccharide (PPSV23) vaccine. One dose is recommended after age 61. Talk to your health care provider about which screenings and vaccines you need and how often you need them. This information is not intended to replace advice given to you by your health care provider. Make sure you discuss any questions you have with your health care provider. Document Released: 11/19/2015 Document Revised: 07/12/2016 Document Reviewed: 08/24/2015 Elsevier Interactive Patient Education  2017 [place] Prevention in the Home Falls can cause injuries. They can happen to people of all ages. There are many things you can do to make your home safe and to help prevent falls. What can I do on the outside of my home? Regularly fix the edges of walkways and driveways and fix any cracks. Remove anything that might make you trip as you walk through a door, such as a raised step or threshold. Trim any bushes or trees on the path to your home. Use bright outdoor lighting. Clear any walking paths of anything that might make someone trip, such as rocks or tools. Regularly check to see if handrails are loose or broken. Make sure that both sides of any steps have handrails. Any raised decks and porches should have guardrails on the edges. Have any leaves, snow, or ice cleared regularly. Use sand or salt on walking paths during winter. Clean up any spills in your garage right away. This includes oil or grease spills. What can I do in the bathroom? Use night lights. Install grab bars by the toilet and in the tub and shower. Do not use towel bars as grab bars. Use non-skid mats or  decals in the tub or shower. If you need to sit down in the shower, use a plastic, non-slip stool. Keep the floor dry. Clean up any water that spills on the floor as soon as it happens. Remove soap buildup in the tub or shower regularly. Attach bath mats securely with double-sided non-slip rug tape. Do not have throw rugs and other things on the floor that can make you trip. What can I do in the bedroom? Use night lights. Make sure that you have a light by your bed that is easy to reach. Do not use any sheets or blankets that are too big for your bed. They should not hang down onto the floor. Have a firm chair that has side arms. You can use this for support while you get dressed. Do not have throw rugs and other things on the floor that can make you trip. What can I do in the kitchen? Clean up any spills right away. Avoid walking on wet floors. Keep items that you use a lot in easy-to-reach places. If you need to reach something above  you, use a strong step stool that has a grab bar. Keep electrical cords out of the way. Do not use floor polish or wax that makes floors slippery. If you must use wax, use non-skid floor wax. Do not have throw rugs and other things on the floor that can make you trip. What can I do with my stairs? Do not leave any items on the stairs. Make sure that there are handrails on both sides of the stairs and use them. Fix handrails that are broken or loose. Make sure that handrails are as long as the stairways. Check any carpeting to make sure that it is firmly attached to the stairs. Fix any carpet that is loose or worn. Avoid having throw rugs at the top or bottom of the stairs. If you do have throw rugs, attach them to the floor with carpet tape. Make sure that you have a light switch at the top of the stairs and the bottom of the stairs. If you do not have them, ask someone to add them for you. What else can I do to help prevent falls? Wear shoes that: Do not  have high heels. Have rubber bottoms. Are comfortable and fit you well. Are closed at the toe. Do not wear sandals. If you use a stepladder: Make sure that it is fully opened. Do not climb a closed stepladder. Make sure that both sides of the stepladder are locked into place. Ask someone to hold it for you, if possible. Clearly mark and make sure that you can see: Any grab bars or handrails. First and last steps. Where the edge of each step is. Use tools that help you move around (mobility aids) if they are needed. These include: Canes. Walkers. Scooters. Crutches. Turn on the lights when you go into a dark area. Replace any light bulbs as soon as they burn out. Set up your furniture so you have a clear path. Avoid moving your furniture around. If any of your floors are uneven, fix them. If there are any pets around you, be aware of where they are. Review your medicines with your doctor. Some medicines can make you feel dizzy. This can increase your chance of falling. Ask your doctor what other things that you can do to help prevent falls. This information is not intended to replace advice given to you by your health care provider. Make sure you discuss any questions you have with your health care provider. Document Released: 08/19/2009 Document Revised: 03/30/2016 Document Reviewed: 11/27/2014 Elsevier Interactive Patient Education  2017 [employer].

## 2023-01-22 NOTE — Addendum Note (Signed)
Addended by: Anabel Halon on: 01/22/2023 03:53 PM   Modules accepted: Orders
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
## 2023-01-23 ENCOUNTER — Encounter: Payer: Self-pay | Admitting: Family Medicine

## 2023-01-23 ENCOUNTER — Ambulatory Visit (INDEPENDENT_AMBULATORY_CARE_PROVIDER_SITE_OTHER): Payer: Medicare Other | Admitting: Family Medicine

## 2023-01-23 ENCOUNTER — Telehealth: Payer: Self-pay

## 2023-01-23 VITALS — BP 114/52 | Ht 61.0 in | Wt 106.0 lb

## 2023-01-23 DIAGNOSIS — E782 Mixed hyperlipidemia: Secondary | ICD-10-CM

## 2023-01-23 DIAGNOSIS — S52592A Other fractures of lower end of left radius, initial encounter for closed fracture: Secondary | ICD-10-CM | POA: Diagnosis present

## 2023-01-23 DIAGNOSIS — Z951 Presence of aortocoronary bypass graft: Secondary | ICD-10-CM

## 2023-01-23 DIAGNOSIS — I2510 Atherosclerotic heart disease of native coronary artery without angina pectoris: Secondary | ICD-10-CM

## 2023-01-23 DIAGNOSIS — S52502A Unspecified fracture of the lower end of left radius, initial encounter for closed fracture: Secondary | ICD-10-CM | POA: Insufficient documentation

## 2023-01-23 MED ORDER — HYDROCODONE-ACETAMINOPHEN 5-325 MG PO TABS: 1.0000 | ORAL_TABLET | Freq: Three times a day (TID) | ORAL | 0 refills | Status: DC | PRN

## 2023-01-23 NOTE — Patient Instructions (Signed)
Nice to meet you Please use sling Please use the pain medicine as needed   Please send me a message in MyChart with any questions or updates.  Please see me back in 7-10 days.   --Dr. Raeford Razor

## 2023-01-23 NOTE — Telephone Encounter (Signed)
Fax received from cover my meds for Nexlizet 180-10 mg. Key [id number]

## 2023-01-23 NOTE — Assessment & Plan Note (Signed)
Acutely occurring.  Initial injury was a few days ago.  Has a fracture observed on x-ray that was a low trauma fracture. -Counseled on home exercise therapy and supportive care. -Placed in splint. -Can reimage and transition to Velcro brace. -Would continue with osteoporosis workup.
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
## 2023-01-24 ENCOUNTER — Other Ambulatory Visit (HOSPITAL_COMMUNITY): Payer: Self-pay

## 2023-02-01 ENCOUNTER — Ambulatory Visit (INDEPENDENT_AMBULATORY_CARE_PROVIDER_SITE_OTHER): Payer: Medicare Other | Admitting: Family Medicine

## 2023-02-01 ENCOUNTER — Ambulatory Visit (HOSPITAL_BASED_OUTPATIENT_CLINIC_OR_DEPARTMENT_OTHER)
Admission: RE | Admit: 2023-02-01 | Discharge: 2023-02-01 | Disposition: A | Discharge status: Home / Self Care | Payer: Medicare Other | Source: Ambulatory Visit | Attending: Family Medicine | Admitting: Family Medicine

## 2023-02-01 VITALS — BP 124/50 | Ht 61.0 in | Wt 105.0 lb

## 2023-02-01 DIAGNOSIS — M8000XA Age-related osteoporosis with current pathological fracture, unspecified site, initial encounter for fracture: Secondary | ICD-10-CM | POA: Diagnosis not present

## 2023-02-01 DIAGNOSIS — S52592D Other fractures of lower end of left radius, subsequent encounter for closed fracture with routine healing: Secondary | ICD-10-CM | POA: Diagnosis present

## 2023-02-01 DIAGNOSIS — E2839 Other primary ovarian failure: Secondary | ICD-10-CM

## 2023-02-01 NOTE — Assessment & Plan Note (Signed)
Doing well with immobilization. Has better range of motion and swelling.  - counseled on home exercise therapy and supportive care  - xray  - velco wrist brace

## 2023-02-01 NOTE — Patient Instructions (Signed)
Good to see you Please use ice as needed Please use the brace  We'll call with the lab results.  Please schedule the bone density at the [facility] location.   Please send me a message in MyChart with any questions or updates.  Please see me back in 2 weeks.   --Dr. Raeford Razor
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
## 2023-02-01 NOTE — Assessment & Plan Note (Signed)
Acute on chronic in nature. Has osteoporotic fracture with history of osteoporosis.  - counseled on supportive care - Vit D, SPEP, IFE, PTh and calcium  - get updated bone density at previous site of scan  - could consider prolia

## 2023-02-05 ENCOUNTER — Telehealth: Payer: Self-pay | Admitting: Family Medicine

## 2023-02-05 NOTE — Telephone Encounter (Signed)
Informed of results.   Rosemarie Ax, MD [facility] 02/05/2023, 9:09 AM

## 2023-02-06 ENCOUNTER — Ambulatory Visit (INDEPENDENT_AMBULATORY_CARE_PROVIDER_SITE_OTHER): Payer: Medicare Other | Admitting: Medical

## 2023-02-06 VITALS — BP 112/56 | HR 65 | Temp 98.1°F | Resp 18 | Ht 61.0 in | Wt 108.0 lb

## 2023-02-06 DIAGNOSIS — R5383 Other fatigue: Secondary | ICD-10-CM | POA: Diagnosis not present

## 2023-02-06 DIAGNOSIS — R197 Diarrhea, unspecified: Secondary | ICD-10-CM | POA: Diagnosis not present

## 2023-02-06 LAB — COMPREHENSIVE METABOLIC PANEL
ALT: 12 U/L (ref 0–35)
AST: 22 U/L (ref 0–37)
Albumin: 4.2 g/dL (ref 3.5–5.2)
Alkaline Phosphatase: 44 U/L (ref 39–117)
BUN: 35 mg/dL — ABNORMAL HIGH (ref 6–23)
CO2: 21 mEq/L (ref 19–32)
Calcium: 9.3 mg/dL (ref 8.4–10.5)
Chloride: 106 mEq/L (ref 96–112)
Creatinine, Ser: 1.6 mg/dL — ABNORMAL HIGH (ref 0.40–1.20)
GFR: 32.53 mL/min — ABNORMAL LOW (ref >60.00)
Glucose, Bld: 157 mg/dL — ABNORMAL HIGH (ref 70–99)
Potassium: 4.4 mEq/L (ref 3.5–5.1)
Sodium: 134 mEq/L — ABNORMAL LOW (ref 135–145)
Total Bilirubin: 0.3 mg/dL (ref 0.2–1.2)
Total Protein: 7.1 g/dL (ref 6.0–8.3)

## 2023-02-06 LAB — CBC WITH DIFFERENTIAL/PLATELET
Basophils Absolute: 0.1 10*3/uL (ref 0.0–0.1)
Basophils Relative: 1.3 % (ref 0.0–3.0)
Eosinophils Absolute: 0.2 10*3/uL (ref 0.0–0.7)
Eosinophils Relative: 2 % (ref 0.0–5.0)
HCT: 31.4 % — ABNORMAL LOW (ref 36.0–46.0)
Hemoglobin: 10.2 g/dL — ABNORMAL LOW (ref 12.0–15.0)
Lymphocytes Relative: 11.8 % — ABNORMAL LOW (ref 12.0–46.0)
Lymphs Abs: 0.9 10*3/uL (ref 0.7–4.0)
MCHC: 32.5 g/dL (ref 30.0–36.0)
MCV: 86.8 fl (ref 78.0–100.0)
Monocytes Absolute: 0.3 10*3/uL (ref 0.1–1.0)
Monocytes Relative: 3.4 % (ref 3.0–12.0)
Neutro Abs: 6.5 10*3/uL (ref 1.4–7.7)
Neutrophils Relative %: 81.5 % — ABNORMAL HIGH (ref 43.0–77.0)
Platelets: 362 10*3/uL (ref 150.0–400.0)
RBC: 3.61 Mil/uL — ABNORMAL LOW (ref 3.87–5.11)
RDW: 13 % (ref 11.5–15.5)
WBC: 8 10*3/uL (ref 4.0–10.5)

## 2023-02-06 LAB — MAGNESIUM: Magnesium: 1.8 mg/dL (ref 1.5–2.5)

## 2023-02-06 NOTE — Patient Instructions (Addendum)
Diarrhea for 3 days with significant amount and described as watery.  Based on the frequency and watery stools considering C. difficile is in differential.  Since symptoms started after eating apple pie considering food poisoning as well.  Make sure that you hydrate well with either sugar-free Gatorade or propel fitness water.  Avoid hydrating drinks with sugar.  Eat bland foods as discussed.  Presently can increase bentyl  to 2 tablets 3 times daily for diarrhea.  Advised turning in stool panel studies on as soon as possible.  Will get CBC, CMP and magnesium level stat.  If labs significantly abnormal may need to recommend emergency department evaluation.  Follow-up date to be determined after lab review.
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
## 2023-02-07 ENCOUNTER — Telehealth: Payer: Self-pay | Admitting: *Deleted

## 2023-02-07 ENCOUNTER — Other Ambulatory Visit: Payer: Self-pay | Admitting: Medical

## 2023-02-07 ENCOUNTER — Other Ambulatory Visit: Payer: Medicare Other

## 2023-02-07 DIAGNOSIS — R197 Diarrhea, unspecified: Secondary | ICD-10-CM

## 2023-02-07 LAB — PROTEIN ELECTROPHORESIS, SERUM
A/G Ratio: 1.2 (ref 0.7–1.7)
Albumin ELP: 3.9 g/dL (ref 2.9–4.4)
Alpha 1: 0.3 g/dL (ref 0.0–0.4)
Alpha 2: 0.8 g/dL (ref 0.4–1.0)
Beta: 1.2 g/dL (ref 0.7–1.3)
Gamma Globulin: 0.9 g/dL (ref 0.4–1.8)
Globulin, Total: 3.2 g/dL (ref 2.2–3.9)

## 2023-02-07 LAB — PTH, INTACT AND CALCIUM
Calcium: 10.5 mg/dL — ABNORMAL HIGH (ref 8.7–10.3)
PTH: 31 pg/mL (ref 15–65)

## 2023-02-07 LAB — IMMUNOFIXATION ELECTROPHORESIS
IgA/Immunoglobulin A, Serum: 221 mg/dL (ref 87–352)
IgG (Immunoglobin G), Serum: 970 mg/dL (ref 586–1602)
IgM (Immunoglobulin M), Srm: 50 mg/dL (ref 26–217)
Total Protein: 7.1 g/dL (ref 6.0–8.5)

## 2023-02-07 LAB — VITAMIN D 25 HYDROXY (VIT D DEFICIENCY, FRACTURES): Vit D, 25-Hydroxy: 61.8 ng/mL (ref 30.0–100.0)

## 2023-02-07 NOTE — Telephone Encounter (Signed)
Pt came in to return stool specimens for stool culture and c. Diff.  Quantity was not sufficient for the C. Diff testing and preservative had been spilled completely from the stool culture vial. Both specimens had to be discarded. Pt was given kit to re-collect C. Diff and stool culture.  Pt had mentioned that she lives in [place] and it is too hard to get specimens returned to our office.  After researching [facility] patient service center locations I found one in [place]. ([address] [place], [place] [address] may be located inside [place]). Called [phone] and spoke with Anderson Malta. She confirmed pt may drop specimens off at their location between 8 and 5 but they are closed daily from 12 to 1 for lunch. She asked that orders be faxed to them at [phone] attn: Anderson Malta.  Attempted to notify pt and left detailed message to call our office if she would like to proceed with this option.

## 2023-02-08 ENCOUNTER — Other Ambulatory Visit: Payer: Self-pay | Admitting: Medical

## 2023-02-08 LAB — OVA AND PARASITE EXAMINATION
CONCENTRATE RESULT:: NONE SEEN
MICRO NUMBER:: 14777491
SPECIMEN QUALITY:: ADEQUATE
TRICHROME RESULT:: NONE SEEN

## 2023-02-08 NOTE — Telephone Encounter (Signed)
Pt returned Tricia's call back. Advised of message Gilmore Laroche left. Pt does want to proceed to do stool specimen at the [address] location. Please fax orders to this location (AttnAnderson Malta; [phone]) and call patient to advise when done so she knows she is able to drop off her specimen.

## 2023-02-09 ENCOUNTER — Telehealth: Payer: Self-pay | Admitting: Family Medicine

## 2023-02-09 NOTE — Telephone Encounter (Signed)
Left VM for patient. If she calls back please have her speak with a nurse/CMA and inform that her labs are in the normal range and we can consider pursing prolia for osteoporosis.   If any questions then please take the best time and phone number to call and I will try to call her back.   Myra Rude, MD [facility] 02/09/2023, 12:14 PM

## 2023-02-09 NOTE — Telephone Encounter (Signed)
Orders faxed to [facility]

## 2023-02-09 NOTE — Addendum Note (Signed)
Addended by: Thelma Barge D on: 02/09/2023 08:25 AM   Modules accepted: Orders

## 2023-02-10 ENCOUNTER — Other Ambulatory Visit: Payer: Self-pay | Admitting: Medical

## 2023-02-13 LAB — OVA AND PARASITE EXAMINATION

## 2023-02-14 ENCOUNTER — Telehealth: Payer: Self-pay | Admitting: Medical

## 2023-02-14 ENCOUNTER — Other Ambulatory Visit: Payer: Self-pay | Admitting: Cardiology

## 2023-02-14 ENCOUNTER — Other Ambulatory Visit (HOSPITAL_COMMUNITY): Payer: Self-pay

## 2023-02-14 LAB — CLOSTRIDIUM DIFFICILE BY PCR: Toxigenic C. Difficile by PCR: POSITIVE — AB

## 2023-02-14 MED ORDER — METRONIDAZOLE 500 MG PO TABS: ORAL_TABLET | ORAL | 0 refills | Status: DC

## 2023-02-14 NOTE — Addendum Note (Signed)
Addended by: Gwenevere Abbot on: 02/14/2023 05:28 PM   Modules accepted: Orders

## 2023-02-14 NOTE — Telephone Encounter (Signed)
Pt called to speak with CMA. She did not wish to share the info with me and prefers to speak to CMA directly. Please call to follow up

## 2023-02-14 NOTE — Telephone Encounter (Signed)
Long conversation with patient after hours explain clinical reasoning on why wanted patient to be seen in the emergency department.  Has been 8 days since I saw her for diarrhea.  She turned in C. difficile studies 2 days ago and the results came back positive.  Patient is having 4 loose stools a day.  She has significant decrease kidney function, history of electrolyte abnormalities and low magnesium.  With her history better to be evaluated in the emergency department.  Patient declines taking if not necessary and she has concern about being admitted.  In light of this I did prescribe Flagyl antibiotic.  Advised to hydrate well and eat bland foods.  Start medication today.  If signs symptoms worsen or change then be seen in the emergency department.  Patient expresses understanding.  Follow-up with me next Tuesday.  Asked her to call tomorrow and get scheduled.

## 2023-02-14 NOTE — Telephone Encounter (Signed)
Pt called back. °

## 2023-02-14 NOTE — Telephone Encounter (Signed)
Rx refill sent to pharmacy. 

## 2023-02-15 ENCOUNTER — Encounter: Payer: Self-pay | Admitting: Family Medicine

## 2023-02-15 ENCOUNTER — Other Ambulatory Visit: Payer: Self-pay

## 2023-02-15 ENCOUNTER — Ambulatory Visit (INDEPENDENT_AMBULATORY_CARE_PROVIDER_SITE_OTHER): Payer: Medicare Other | Admitting: Family Medicine

## 2023-02-15 ENCOUNTER — Other Ambulatory Visit: Payer: Self-pay | Admitting: Medical

## 2023-02-15 VITALS — BP 110/52 | Ht 61.0 in | Wt 108.0 lb

## 2023-02-15 DIAGNOSIS — S52592D Other fractures of lower end of left radius, subsequent encounter for closed fracture with routine healing: Secondary | ICD-10-CM | POA: Diagnosis present

## 2023-02-15 DIAGNOSIS — R197 Diarrhea, unspecified: Secondary | ICD-10-CM
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
## 2023-02-15 NOTE — Telephone Encounter (Signed)
I cannot see denial reason but she definitely meets coverage criteria, not sure if a wrong answer was selected on the prior auth form?  Pt has ASCVD (CABG), is statin intolerant to atorvastatin, lovastatin, and rosuvastatin, and her LDL is 124 from 12/06/22 which is above her goal  < 55 given hx of ASCVD and DM. Her insurance should cover Nexlizet, she meets all clinical criteria that a plan should require.

## 2023-02-15 NOTE — Patient Instructions (Signed)
Good to see you Please use ice as needed  Please use the splint for 1-2 more weeks  Please work on the range of motion   Please send me a message in MyChart with any questions or updates.  Please see me back as needed.   --Dr. Jordan Likes s

## 2023-02-15 NOTE — Assessment & Plan Note (Signed)
Initial injury was roughly about a month ago.  Has no pain today.  Her range of motion is near normal. -Counseled on home exercise therapy and supportive care. -Counseled on bracing -Could reimage if needed.

## 2023-02-15 NOTE — Telephone Encounter (Addendum)
Per 12/28/22 encounter PA was denied. This will need an appeal at this point for approval. [facility], PLEASE ADVISE

## 2023-02-16 ENCOUNTER — Other Ambulatory Visit (HOSPITAL_COMMUNITY): Payer: Self-pay

## 2023-02-16 LAB — STOOL CULTURE: E coli, Shiga toxin Assay: NEGATIVE

## 2023-02-16 NOTE — Telephone Encounter (Signed)
[place] Sink, I would be happy to start an appeal but I do need to be able to see the original denial letter in order to do so. I tried to pull the KEY on the previous encounter but it was an incomplete key. Can someone from the office send me the denial letter directly or transmit it to the chart media? Once I have the denial I can send off appeal.

## 2023-02-16 NOTE — Telephone Encounter (Signed)
Tiffany's earlier phone note mentions this key [id number], hopefully that will work?

## 2023-02-16 NOTE — Telephone Encounter (Signed)
No worries, just route me back in once we have that documentation on the chart so I can start the appeal.

## 2023-02-16 NOTE — Telephone Encounter (Signed)
Not sure then, hopefully Elmarie Shiley has this info from initial submission.

## 2023-02-19 ENCOUNTER — Other Ambulatory Visit (HOSPITAL_COMMUNITY): Payer: Self-pay

## 2023-02-19 NOTE — Telephone Encounter (Signed)
Thank you! Appeal has been sent.

## 2023-02-20 ENCOUNTER — Other Ambulatory Visit (HOSPITAL_COMMUNITY): Payer: Self-pay

## 2023-02-20 ENCOUNTER — Ambulatory Visit (INDEPENDENT_AMBULATORY_CARE_PROVIDER_SITE_OTHER): Payer: Medicare Other | Admitting: Medical

## 2023-02-20 VITALS — BP 130/60 | HR 75 | Resp 18 | Ht 61.0 in | Wt 106.0 lb

## 2023-02-20 DIAGNOSIS — R944 Abnormal results of kidney function studies: Secondary | ICD-10-CM

## 2023-02-20 DIAGNOSIS — R5383 Other fatigue: Secondary | ICD-10-CM

## 2023-02-20 DIAGNOSIS — R197 Diarrhea, unspecified: Secondary | ICD-10-CM

## 2023-02-20 DIAGNOSIS — A498 Other bacterial infections of unspecified site: Secondary | ICD-10-CM

## 2023-02-20 LAB — COMPREHENSIVE METABOLIC PANEL
ALT: 12 U/L (ref 0–35)
AST: 20 U/L (ref 0–37)
Albumin: 4.3 g/dL (ref 3.5–5.2)
Alkaline Phosphatase: 38 U/L — ABNORMAL LOW (ref 39–117)
BUN: 43 mg/dL — ABNORMAL HIGH (ref 6–23)
CO2: 21 mEq/L (ref 19–32)
Calcium: 9.7 mg/dL (ref 8.4–10.5)
Chloride: 108 mEq/L (ref 96–112)
Creatinine, Ser: 1.59 mg/dL — ABNORMAL HIGH (ref 0.40–1.20)
GFR: 32.76 mL/min — ABNORMAL LOW (ref >60.00)
Glucose, Bld: 167 mg/dL — ABNORMAL HIGH (ref 70–99)
Potassium: 4.7 mEq/L (ref 3.5–5.1)
Sodium: 138 mEq/L (ref 135–145)
Total Bilirubin: 0.3 mg/dL (ref 0.2–1.2)
Total Protein: 6.8 g/dL (ref 6.0–8.3)

## 2023-02-20 LAB — CBC WITH DIFFERENTIAL/PLATELET
Basophils Absolute: 0.1 10*3/uL (ref 0.0–0.1)
Basophils Relative: 1 % (ref 0.0–3.0)
Eosinophils Absolute: 0.2 10*3/uL (ref 0.0–0.7)
Eosinophils Relative: 2.6 % (ref 0.0–5.0)
HCT: 29.2 % — ABNORMAL LOW (ref 36.0–46.0)
Hemoglobin: 9.6 g/dL — ABNORMAL LOW (ref 12.0–15.0)
Lymphocytes Relative: 14.1 % (ref 12.0–46.0)
Lymphs Abs: 1.1 10*3/uL (ref 0.7–4.0)
MCHC: 32.9 g/dL (ref 30.0–36.0)
MCV: 86.1 fl (ref 78.0–100.0)
Monocytes Absolute: 0.4 10*3/uL (ref 0.1–1.0)
Monocytes Relative: 5 % (ref 3.0–12.0)
Neutro Abs: 6 10*3/uL (ref 1.4–7.7)
Neutrophils Relative %: 77.3 % — ABNORMAL HIGH (ref 43.0–77.0)
Platelets: 420 10*3/uL — ABNORMAL HIGH (ref 150.0–400.0)
RBC: 3.4 Mil/uL — ABNORMAL LOW (ref 3.87–5.11)
RDW: 13.5 % (ref 11.5–15.5)
WBC: 7.7 10*3/uL (ref 4.0–10.5)

## 2023-02-20 LAB — MAGNESIUM: Magnesium: 1.7 mg/dL (ref 1.5–2.5)

## 2023-02-20 MED ORDER — GABAPENTIN 300 MG PO CAPS: 300.0000 mg | ORAL_CAPSULE | Freq: Two times a day (BID) | ORAL | 11 refills | Status: DC

## 2023-02-20 MED ORDER — VANCOMYCIN HCL 125 MG PO CAPS: 125.0000 mg | ORAL_CAPSULE | Freq: Four times a day (QID) | ORAL | 0 refills | Status: AC

## 2023-02-20 NOTE — Patient Instructions (Addendum)
1. Clostridioides difficile infection   2. Diarrhea, unspecified type  - Comp Met (CMET) - CBC w/Diff - Magnesium  3. Fatigue, unspecified type  - Comp Met (CMET) - CBC w/Diff - Magnesium  4. Decreased GFR  - Comp Met (CMET)  5. Hypomagnesemia  - Magnesium   Fatigue with prolonged course of diarrhea and c dif + study last week. Declined ED evaluation as advised. Minimal improvement with flagyl.   Will get stat cbc, cmp and magnesium stat. If significant lab abnormalities may have to advise ED evaluation.  Considering switching to vancomycin. Want to recheck labs and kidney function first. Clinically concerned not responding to flagyl.  Discussed with pharmacist renal dosing of  oral vancomycin. Our pharmacist was unsure. She gave me number to hospital pharmac [phone]. Main pharmacy. Will wait for labs and then call.  Did get work from our clinical pharmacist who  confirmed oral vancomycin is not adjusted for renal function as there is little to no systemic absorption. 125mg  4 time a day for 10 days. Package insert does recommend monitoring renal function in patients over 65.  If you are bing conservative would check BMET about 5 to 7 days into therapy.   Follow up date to be determined after lab review.

## 2023-02-20 NOTE — Addendum Note (Signed)
Addended by: Gwenevere Abbot on: 02/20/2023 08:10 PM   Modules accepted: Orders
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
## 2023-02-20 NOTE — Telephone Encounter (Signed)
Pharmacy Patient Advocate Encounter  Prior Authorization for NEXLIZET has been approved.    Effective dates: 11/06/22 through 02/20/23  Haze Rushing, CPhT Pharmacy Patient Advocate Specialist Direct Number: [phone] Fax: [phone]

## 2023-02-21 ENCOUNTER — Telehealth (HOSPITAL_BASED_OUTPATIENT_CLINIC_OR_DEPARTMENT_OTHER): Payer: Self-pay

## 2023-02-21 ENCOUNTER — Encounter: Payer: Self-pay | Admitting: *Deleted

## 2023-02-21 MED ORDER — NEXLIZET 180-10 MG PO TABS
1.0000 | ORAL_TABLET | Freq: Every day | ORAL | 12 refills | Status: DC
Start: 2023-02-21 — End: 2023-07-25

## 2023-02-21 NOTE — Addendum Note (Signed)
Addended by: Anh Bigos E on: 02/21/2023 07:59 AM   Modules accepted: Orders

## 2023-02-24 ENCOUNTER — Other Ambulatory Visit: Payer: Self-pay | Admitting: Physician Assistant

## 2023-03-03 ENCOUNTER — Other Ambulatory Visit: Payer: Self-pay | Admitting: Physician Assistant

## 2023-03-05 ENCOUNTER — Ambulatory Visit (HOSPITAL_BASED_OUTPATIENT_CLINIC_OR_DEPARTMENT_OTHER)
Admission: RE | Admit: 2023-03-05 | Discharge: 2023-03-05 | Disposition: A | Discharge status: Home / Self Care | Payer: Medicare Other | Source: Ambulatory Visit | Attending: Medical | Admitting: Medical

## 2023-03-05 ENCOUNTER — Ambulatory Visit (INDEPENDENT_AMBULATORY_CARE_PROVIDER_SITE_OTHER): Payer: Medicare Other | Admitting: Medical

## 2023-03-05 DIAGNOSIS — R197 Diarrhea, unspecified: Secondary | ICD-10-CM

## 2023-03-05 DIAGNOSIS — R059 Cough, unspecified: Secondary | ICD-10-CM | POA: Diagnosis present

## 2023-03-05 DIAGNOSIS — J029 Acute pharyngitis, unspecified: Secondary | ICD-10-CM | POA: Diagnosis not present

## 2023-03-05 DIAGNOSIS — R5383 Other fatigue: Secondary | ICD-10-CM | POA: Diagnosis not present

## 2023-03-05 LAB — POCT RAPID STREP A (OFFICE): Rapid Strep A Screen: NEGATIVE
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
## 2023-03-05 NOTE — Patient Instructions (Addendum)
1. Hypomagnesemia Continue magnesium same as before pending results. - Magnesium; Future  2. Fatigue, unspecified type Hydrate well with propel fitness water - POCT rapid strep A - Culture, Group A Strep - Comp Met (CMET); Future  3. Diarrhea, unspecified type Continue vancomycin for additonal 5 days. Turn in cdif later this week. If still + will refer to ID. - Clostridium Difficile by PCR  4. Pharyngitis, unspecified etiology Rapid strep negative. Send out culture sent.  5. Cough, unspecified type Benzonatate for cough. Can use otc antistamine claritin or xyzal.  Cxr to evaluate no walking pneumonia. - DG Chest 2 View; Future  -cbc  If you get have dangerous lab value or severe worse signs/symptoms be seen in ED.  Follow up in 7 days or sooner if needed

## 2023-03-06 ENCOUNTER — Other Ambulatory Visit: Payer: Self-pay | Admitting: Physician Assistant

## 2023-03-06 ENCOUNTER — Telehealth: Payer: Self-pay | Admitting: Medical

## 2023-03-06 ENCOUNTER — Other Ambulatory Visit: Payer: Self-pay | Admitting: Medical

## 2023-03-06 LAB — CBC WITH DIFFERENTIAL/PLATELET
Basophils Absolute: 0.1 10*3/uL (ref 0.0–0.1)
Basophils Relative: 1 % (ref 0.0–3.0)
Eosinophils Absolute: 0.4 10*3/uL (ref 0.0–0.7)
Eosinophils Relative: 5.2 % — ABNORMAL HIGH (ref 0.0–5.0)
HCT: 30.8 % — ABNORMAL LOW (ref 36.0–46.0)
Hemoglobin: 10.2 g/dL — ABNORMAL LOW (ref 12.0–15.0)
Lymphocytes Relative: 15.5 % (ref 12.0–46.0)
Lymphs Abs: 1.1 10*3/uL (ref 0.7–4.0)
MCHC: 32.9 g/dL (ref 30.0–36.0)
MCV: 87.4 fl (ref 78.0–100.0)
Monocytes Absolute: 0.4 10*3/uL (ref 0.1–1.0)
Monocytes Relative: 6.2 % (ref 3.0–12.0)
Neutro Abs: 5 10*3/uL (ref 1.4–7.7)
Neutrophils Relative %: 72.1 % (ref 43.0–77.0)
Platelets: 385 10*3/uL (ref 150.0–400.0)
RBC: 3.53 Mil/uL — ABNORMAL LOW (ref 3.87–5.11)
RDW: 13.9 % (ref 11.5–15.5)
WBC: 7 10*3/uL (ref 4.0–10.5)

## 2023-03-06 MED ORDER — VANCOMYCIN HCL 125 MG PO CAPS: 125.0000 mg | ORAL_CAPSULE | Freq: Four times a day (QID) | ORAL | 0 refills | Status: DC

## 2023-03-06 MED ORDER — HYDRALAZINE HCL 25 MG PO TABS: 25.0000 mg | ORAL_TABLET | Freq: Three times a day (TID) | ORAL | 11 refills | Status: DC

## 2023-03-06 NOTE — Addendum Note (Signed)
Addended by: Gwenevere Abbot on: 03/06/2023 01:44 PM   Modules accepted: Orders

## 2023-03-06 NOTE — Addendum Note (Signed)
Addended by: Gwenevere Abbot on: 03/06/2023 01:06 PM   Modules accepted: Orders

## 2023-03-06 NOTE — Telephone Encounter (Signed)
Labs were reviewed with pt when lab appt made  Pt called and lvm to let her know medication has been sent

## 2023-03-06 NOTE — Telephone Encounter (Signed)
Pt called with three points to go over:  Pt called to go over imaging results from her last appt. Pt called stating that the vancomycin  that Ramon Dredge was supposed to send in on her last appt was not sent in. Confirmed this after reviewing chart. Pt called stating she needed a refill on the following:  Prescription Request  03/06/2023  Is this a "Controlled Substance" medicine? No  LOV: 03/05/2023  What is the name of the medication or equipment?   hydrALAZINE (APRESOLINE) 25 MG tablet [[id number]]   Have you contacted your pharmacy to request a refill? Yes   Which pharmacy would you like this sent to?   [id number] - [place], [place] - [address] [place] [address] Phone: [phone] Fax: [phone]  Patient notified that their request is being sent to the clinical staff for review and that they should receive a response within 2 business days.   Please advise at Mobile [phone] (mobile)

## 2023-03-07 ENCOUNTER — Other Ambulatory Visit (INDEPENDENT_AMBULATORY_CARE_PROVIDER_SITE_OTHER): Payer: Medicare Other

## 2023-03-07 DIAGNOSIS — R5383 Other fatigue: Secondary | ICD-10-CM

## 2023-03-07 LAB — CULTURE, GROUP A STREP
MICRO NUMBER:: 14886622
SPECIMEN QUALITY:: ADEQUATE

## 2023-03-07 LAB — COMPREHENSIVE METABOLIC PANEL
ALT: 14 U/L (ref 0–35)
AST: 20 U/L (ref 0–37)
Albumin: 3.9 g/dL (ref 3.5–5.2)
Alkaline Phosphatase: 36 U/L — ABNORMAL LOW (ref 39–117)
BUN: 32 mg/dL — ABNORMAL HIGH (ref 6–23)
CO2: 21 mEq/L (ref 19–32)
Calcium: 9.5 mg/dL (ref 8.4–10.5)
Chloride: 102 mEq/L (ref 96–112)
Creatinine, Ser: 1.47 mg/dL — ABNORMAL HIGH (ref 0.40–1.20)
GFR: 35.99 mL/min — ABNORMAL LOW (ref >60.00)
Glucose, Bld: 172 mg/dL — ABNORMAL HIGH (ref 70–99)
Potassium: 4.1 mEq/L (ref 3.5–5.1)
Sodium: 133 mEq/L — ABNORMAL LOW (ref 135–145)
Total Bilirubin: 0.3 mg/dL (ref 0.2–1.2)
Total Protein: 6.9 g/dL (ref 6.0–8.3)

## 2023-03-07 LAB — MAGNESIUM: Magnesium: 1.7 mg/dL (ref 1.5–2.5)

## 2023-03-09 ENCOUNTER — Other Ambulatory Visit: Payer: Self-pay | Admitting: Medical

## 2023-03-12 ENCOUNTER — Ambulatory Visit (INDEPENDENT_AMBULATORY_CARE_PROVIDER_SITE_OTHER): Payer: Medicare Other | Admitting: Medical

## 2023-03-12 ENCOUNTER — Encounter: Payer: Self-pay | Admitting: Medical

## 2023-03-12 VITALS — BP 130/60 | HR 74 | Ht 61.0 in | Wt 108.0 lb

## 2023-03-12 DIAGNOSIS — R197 Diarrhea, unspecified: Secondary | ICD-10-CM

## 2023-03-12 DIAGNOSIS — I10 Essential (primary) hypertension: Secondary | ICD-10-CM | POA: Diagnosis not present

## 2023-03-12 MED ORDER — VANCOMYCIN HCL 125 MG PO CAPS: 125.0000 mg | ORAL_CAPSULE | Freq: Four times a day (QID) | ORAL | 0 refills | Status: AC

## 2023-03-12 NOTE — Patient Instructions (Addendum)
1. Diarrhea, unspecified type. Much improved. More formed stools. Continue bland healthy foods. Gave 3 more days of vancomycin pending c dif testing resuls. - Clostridium Difficile by PCR  2. Hypertension, unspecified type  Continue coreg, valsartan, amlodipine and hydralazine.  Bp well controlled on recheck. On review current hydralazine 25 mg three times daily. Not on 100 mg tid. Check bp daily and let know if any bp less than 120 systolic(top number).  3. Vit d deficiency- recommend get 2000 iu daily over the counter.  Follow up date to be determined after lab review.
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
## 2023-03-15 LAB — CLOSTRIDIUM DIFFICILE BY PCR: Toxigenic C. Difficile by PCR: NEGATIVE

## 2023-03-23 ENCOUNTER — Other Ambulatory Visit: Payer: Self-pay | Admitting: Endocrinology

## 2023-03-23 DIAGNOSIS — R5383 Other fatigue: Secondary | ICD-10-CM

## 2023-03-23 NOTE — Telephone Encounter (Signed)
Looks like this medication was discontinued at last visit. Please verify.

## 2023-03-29 ENCOUNTER — Telehealth: Payer: Self-pay | Admitting: Medical

## 2023-03-29 NOTE — Telephone Encounter (Signed)
Pt said she has been checking her blood pressure from 03/13/23 through today and the readings have been ranged around 178/165. Please call to advise

## 2023-03-29 NOTE — Telephone Encounter (Signed)
Pt called nd lvm to return call

## 2023-03-29 NOTE — Telephone Encounter (Signed)
Patient advised of Edward's message. Pt refused ED but made an appointment for tomorrow. Pt advised to go to ED if she gets worse.

## 2023-03-30 ENCOUNTER — Ambulatory Visit (INDEPENDENT_AMBULATORY_CARE_PROVIDER_SITE_OTHER): Payer: Medicare Other | Admitting: Medical

## 2023-03-30 ENCOUNTER — Encounter: Payer: Self-pay | Admitting: Medical

## 2023-03-30 VITALS — BP 125/60 | HR 71 | Temp 98.0°F | Resp 18 | Ht 61.0 in | Wt 108.0 lb

## 2023-03-30 DIAGNOSIS — I10 Essential (primary) hypertension: Secondary | ICD-10-CM

## 2023-03-30 DIAGNOSIS — E559 Vitamin D deficiency, unspecified: Secondary | ICD-10-CM
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
## 2023-03-30 NOTE — Patient Instructions (Addendum)
1. Hypertension, unspecified type  Continue coreg, valsartan, amlodipine and hydralazine. On review current hydralazine 50 mg three times daily. Get new machine and check bp twice daily as we discussed. I think your machine is not working correctly. If with new machine you get sporadic very high levels let me know. If so would refer to cardiologist to consider monitor to check bp over 24 hour period.   Update me in one week on bp readings.   2. Vitamin D deficiency  Last vit d level normal. So to maintain vit d level can use 1000 iu daily.  Follow up date to be determined after you update in one week on your bp readings.

## 2023-04-05 ENCOUNTER — Other Ambulatory Visit: Payer: Self-pay | Admitting: Medical

## 2023-04-09 ENCOUNTER — Telehealth: Payer: Self-pay | Admitting: Medical

## 2023-04-09 MED ORDER — HYDRALAZINE HCL 25 MG PO TABS: ORAL_TABLET | ORAL | 3 refills | Status: DC

## 2023-04-09 NOTE — Addendum Note (Signed)
Addended by: Gwenevere Abbot on: 04/09/2023 08:06 PM   Modules accepted: Orders

## 2023-04-09 NOTE — Telephone Encounter (Signed)
Patient has tracked BP for 11 days lowest was 154/62 and highest 180/64. She just wanted to inform provider.

## 2023-04-10 ENCOUNTER — Ambulatory Visit (INDEPENDENT_AMBULATORY_CARE_PROVIDER_SITE_OTHER): Payer: Medicare Other | Admitting: Medical

## 2023-04-10 ENCOUNTER — Encounter: Payer: Self-pay | Admitting: Medical

## 2023-04-10 VITALS — BP 160/65 | HR 72 | Ht 61.0 in | Wt 108.0 lb

## 2023-04-10 DIAGNOSIS — I10 Essential (primary) hypertension: Secondary | ICD-10-CM

## 2023-04-10 MED ORDER — HYDRALAZINE HCL 100 MG PO TABS: 100.0000 mg | ORAL_TABLET | Freq: Three times a day (TID) | ORAL | 0 refills | Status: DC

## 2023-04-10 NOTE — Telephone Encounter (Signed)
Pt has an appt today.  

## 2023-04-10 NOTE — Patient Instructions (Signed)
Htn- on coreg, valsartan, amlodipine and hydralazine. Bp readings have been high despite current regimen. Increase hydralzine to 100 mg tid. With high and varying readings decide to refer to cardiologist for potential 24 hour monitoring and get advise on dosing of meds or add on med.  Call cardiologist office if they don't call you by one week.  If any very high or low bp changes pending cardiologist appt let me know.  Follow up date to be determined after specialist appointment.
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
## 2023-04-19 ENCOUNTER — Ambulatory Visit: Payer: Medicare Other | Attending: Cardiology | Admitting: Cardiology

## 2023-04-19 ENCOUNTER — Encounter: Payer: Self-pay | Admitting: Cardiology

## 2023-04-19 VITALS — BP 138/80 | HR 62 | Ht 61.0 in | Wt 107.4 lb

## 2023-04-19 DIAGNOSIS — I2510 Atherosclerotic heart disease of native coronary artery without angina pectoris: Secondary | ICD-10-CM

## 2023-04-19 DIAGNOSIS — E782 Mixed hyperlipidemia: Secondary | ICD-10-CM

## 2023-04-19 DIAGNOSIS — I10 Essential (primary) hypertension: Secondary | ICD-10-CM

## 2023-04-19 DIAGNOSIS — Z720 Tobacco use: Secondary | ICD-10-CM | POA: Diagnosis present

## 2023-04-19 DIAGNOSIS — R5383 Other fatigue: Secondary | ICD-10-CM

## 2023-04-19 DIAGNOSIS — Z951 Presence of aortocoronary bypass graft: Secondary | ICD-10-CM

## 2023-04-19 DIAGNOSIS — Z794 Long term (current) use of insulin: Secondary | ICD-10-CM

## 2023-04-19 DIAGNOSIS — E088 Diabetes mellitus due to underlying condition with unspecified complications: Secondary | ICD-10-CM

## 2023-04-19 NOTE — Patient Instructions (Signed)
Medication Instructions:  Your physician recommends that you continue on your current medications as directed. Please refer to the Current Medication list given to you today.  *If you need a refill on your cardiac medications before your next appointment, please call your pharmacy*   Lab Work: Your physician recommends that you return for lab work in: Today for a BMP, CBC, TSH and a Direct LDL  If you have labs (blood work) drawn today and your tests are completely normal, you will receive your results only by: MyChart Message (if you have MyChart) OR A paper copy in the mail If you have any lab test that is abnormal or we need to change your treatment, we will call you to review the results.   Testing/Procedures: Your physician has requested that you have a renal artery duplex. During this test, an ultrasound is used to evaluate blood flow to the kidneys. Allow one hour for this exam. Do not eat after midnight the day before and avoid carbonated beverages. Take your medications as you usually do.    Follow-Up: At [facility], you and your health needs are our priority.  As part of our continuing mission to provide you with exceptional heart care, we have created designated Provider Care Teams.  These Care Teams include your primary Cardiologist (physician) and Advanced Practice Providers (APPs -  Physician Assistants and Nurse Practitioners) who all work together to provide you with the care you need, when you need it.  We recommend signing up for the patient portal called "MyChart".  Sign up information is provided on this After Visit Summary.  MyChart is used to connect with patients for Virtual Visits (Telemedicine).  Patients are able to view lab/test results, encounter notes, upcoming appointments, etc.  Non-urgent messages can be sent to your provider as well.   To learn more about what you can do with MyChart, go to [email or web address].    Your next appointment:   3  month(s)  Provider:   Belva Crome, MD    Other Instructions Check and record BP two times daily
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
## 2023-04-20 LAB — BASIC METABOLIC PANEL WITH GFR
BUN/Creatinine Ratio: 27 (ref 12–28)
BUN: 37 mg/dL — ABNORMAL HIGH (ref 8–27)
CO2: 17 mmol/L — ABNORMAL LOW (ref 20–29)
Calcium: 10.3 mg/dL (ref 8.7–10.3)
Chloride: 104 mmol/L (ref 96–106)
Creatinine, Ser: 1.37 mg/dL — ABNORMAL HIGH (ref 0.57–1.00)
Glucose: 186 mg/dL — ABNORMAL HIGH (ref 70–99)
Potassium: 4.6 mmol/L (ref 3.5–5.2)
Sodium: 139 mmol/L (ref 134–144)
eGFR: 42 mL/min/1.73 — ABNORMAL LOW

## 2023-04-20 LAB — CBC WITH DIFFERENTIAL/PLATELET
Basophils Absolute: 0.1 x10E3/uL (ref 0.0–0.2)
Basos: 1 %
EOS (ABSOLUTE): 0.1 x10E3/uL (ref 0.0–0.4)
Eos: 2 %
Hematocrit: 32.2 % — ABNORMAL LOW (ref 34.0–46.6)
Hemoglobin: 10.3 g/dL — ABNORMAL LOW (ref 11.1–15.9)
Immature Grans (Abs): 0 x10E3/uL (ref 0.0–0.1)
Immature Granulocytes: 0 %
Lymphocytes Absolute: 1.1 x10E3/uL (ref 0.7–3.1)
Lymphs: 14 %
MCH: 27.6 pg (ref 26.6–33.0)
MCHC: 32 g/dL (ref 31.5–35.7)
MCV: 86 fL (ref 79–97)
Monocytes Absolute: 0.3 x10E3/uL (ref 0.1–0.9)
Monocytes: 4 %
Neutrophils Absolute: 6.2 x10E3/uL (ref 1.4–7.0)
Neutrophils: 79 %
Platelets: 416 x10E3/uL (ref 150–450)
RBC: 3.73 x10E6/uL — ABNORMAL LOW (ref 3.77–5.28)
RDW: 14 % (ref 11.7–15.4)
WBC: 7.8 x10E3/uL (ref 3.4–10.8)

## 2023-04-20 LAB — TSH: TSH: 4.48 u[IU]/mL (ref 0.450–4.500)

## 2023-04-20 LAB — LDL CHOLESTEROL, DIRECT: LDL Direct: 131 mg/dL — ABNORMAL HIGH (ref 0–99)

## 2023-04-30 ENCOUNTER — Ambulatory Visit: Payer: Medicare Other

## 2023-05-03 ENCOUNTER — Other Ambulatory Visit: Payer: Self-pay | Admitting: Medical

## 2023-05-10 ENCOUNTER — Other Ambulatory Visit: Payer: Self-pay | Admitting: Medical

## 2023-06-05 ENCOUNTER — Ambulatory Visit: Payer: Medicare Other | Admitting: Endocrinology

## 2023-06-07 ENCOUNTER — Other Ambulatory Visit: Payer: Self-pay | Admitting: Medical

## 2023-06-11 ENCOUNTER — Telehealth: Payer: Self-pay | Admitting: Medical

## 2023-06-11 MED ORDER — HYDRALAZINE HCL 25 MG PO TABS: ORAL_TABLET | ORAL | 3 refills | Status: DC

## 2023-06-11 NOTE — Telephone Encounter (Signed)
Medication:  hydrALAZINE (APRESOLINE) 100 MG tablet  Has the patient contacted their pharmacy? Yes.   Pt stated there was an issue and wanted to make sure she had a refill for the 100mg    Preferred Pharmacy:   [id number] - [place], [place] - [address] AT [facility] [address] [place] [address] Phone: [phone]  Fax: [phone]

## 2023-06-11 NOTE — Telephone Encounter (Signed)
Rx sent 

## 2023-06-14 ENCOUNTER — Other Ambulatory Visit: Payer: Self-pay | Admitting: Medical

## 2023-06-19 ENCOUNTER — Other Ambulatory Visit: Payer: Self-pay | Admitting: Physician Assistant

## 2023-06-19 ENCOUNTER — Other Ambulatory Visit: Payer: Self-pay | Admitting: Medical

## 2023-06-21 ENCOUNTER — Ambulatory Visit: Payer: Medicare Other

## 2023-06-21 ENCOUNTER — Ambulatory Visit: Payer: Medicare Other | Attending: Cardiology

## 2023-06-21 DIAGNOSIS — I10 Essential (primary) hypertension: Secondary | ICD-10-CM | POA: Insufficient documentation

## 2023-06-21 DIAGNOSIS — Z951 Presence of aortocoronary bypass graft: Secondary | ICD-10-CM | POA: Diagnosis not present

## 2023-06-21 DIAGNOSIS — E782 Mixed hyperlipidemia: Secondary | ICD-10-CM | POA: Diagnosis not present

## 2023-06-21 DIAGNOSIS — E088 Diabetes mellitus due to underlying condition with unspecified complications: Secondary | ICD-10-CM | POA: Diagnosis present

## 2023-06-21 DIAGNOSIS — I2510 Atherosclerotic heart disease of native coronary artery without angina pectoris: Secondary | ICD-10-CM | POA: Diagnosis present

## 2023-06-22 ENCOUNTER — Telehealth: Payer: Self-pay

## 2023-06-22 NOTE — Telephone Encounter (Signed)
-----   Message from Flossie Dibble sent at 06/22/2023 11:09 AM EDT ----- Can we please let her know that her renal ultrasound shows some stenosis or narrowing but nothing that needs an intervention at this time. Just maintain good BP control. Stable results.

## 2023-06-22 NOTE — Telephone Encounter (Signed)
Results reviewed with pt as per Jennifer Woody PA's note.  Pt verbalized understanding and had no additional questions. Routed to PCP.  

## 2023-06-29 ENCOUNTER — Other Ambulatory Visit: Payer: Self-pay | Admitting: Pharmacist

## 2023-06-29 ENCOUNTER — Ambulatory Visit: Payer: Medicare Other | Attending: Interventional Cardiology | Admitting: Pharmacist

## 2023-06-29 ENCOUNTER — Telehealth: Payer: Self-pay

## 2023-06-29 ENCOUNTER — Other Ambulatory Visit: Payer: Self-pay | Admitting: Medical

## 2023-06-29 DIAGNOSIS — E782 Mixed hyperlipidemia: Secondary | ICD-10-CM | POA: Diagnosis present

## 2023-06-29 NOTE — Assessment & Plan Note (Signed)
Assessment: LDL-C is above goal of <55 Last LDL-C 131 on just fenofibrate PCSK9i not affordable right now due to lack of pt assistance Does have good coverage for Leqvio  Plan: Submit Leqvio start form Refer to [employer] after 2nd dose

## 2023-06-29 NOTE — Patient Instructions (Addendum)
I will submit paperwork for Leqvio Someone from the [facility] infusion center will reach out to you Please call me at [phone] with any questions
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
## 2023-06-29 NOTE — Telephone Encounter (Signed)
Auth Submission: NO AUTH NEEDED Site of care: Site of care: [facility] Payer: Medicare A/B plus BCBS supplement Medication & CPT/J Code(s) submitted: Leqvio (Inclisiran) J1306 Auth type: Buy/Bill HB Units/visits requested: 284mg  x 2 doses Approval from: 06/29/23 to 12/06/23   Medicare A/B will cover 80%, BCBS supplement will cover the remaining 20%

## 2023-07-06 ENCOUNTER — Telehealth: Payer: Self-pay | Admitting: Medical

## 2023-07-06 MED ORDER — PANTOPRAZOLE SODIUM 40 MG PO TBEC: 40.0000 mg | DELAYED_RELEASE_TABLET | Freq: Every day | ORAL | 1 refills | Status: DC

## 2023-07-06 MED ORDER — AMLODIPINE BESYLATE 10 MG PO TABS: 10.0000 mg | ORAL_TABLET | Freq: Every day | ORAL | 0 refills | Status: DC

## 2023-07-06 NOTE — Telephone Encounter (Signed)
 Rx sent    [name]  [facility] Student

## 2023-07-06 NOTE — Telephone Encounter (Signed)
Prescription Request  07/06/2023  Is this a "Controlled Substance" medicine? No  LOV: 04/10/2023  What is the name of the medication or equipment? AMLODIPINE AND PANTOPRAZOLE  Have you contacted your pharmacy to request a refill? No   Which pharmacy would you like this sent to?  [id number] - [place], [place] - [address] [place] [address] Phone: [phone] Fax: [phone]   Patient notified that their request is being sent to the clinical staff for review and that they should receive a response within 2 business days.   Please advise at Mobile [phone] (mobile)

## 2023-07-11 ENCOUNTER — Ambulatory Visit (INDEPENDENT_AMBULATORY_CARE_PROVIDER_SITE_OTHER): Payer: Medicare Other

## 2023-07-11 VITALS — BP 124/59 | HR 64 | Temp 97.5°F | Resp 16 | Ht 61.0 in | Wt 110.6 lb

## 2023-07-11 DIAGNOSIS — E782 Mixed hyperlipidemia: Secondary | ICD-10-CM

## 2023-07-11 DIAGNOSIS — Z951 Presence of aortocoronary bypass graft: Secondary | ICD-10-CM

## 2023-07-11 MED ORDER — INCLISIRAN SODIUM 284 MG/1.5ML ~~LOC~~ SOSY
284.0000 mg | PREFILLED_SYRINGE | Freq: Once | SUBCUTANEOUS | Status: AC
  Administered 2023-07-11: 284 mg via SUBCUTANEOUS
  Filled 2023-07-11: qty 1.5

## 2023-07-11 NOTE — Progress Notes (Signed)
 Diagnosis: Hyperlipidemia  Provider:  Chilton Greathouse MD  Procedure: Injection  Leqvio (inclisiran), Dose: 284 mg, Site: subcutaneous, Number of injections: 1  Post Care: Observation period completed. Right arm injection.  Discharge: Condition: Good, Destination: Home . AVS Provided  Performed by:  Rico Ala, LPN

## 2023-07-20 ENCOUNTER — Ambulatory Visit: Payer: Medicare Other | Attending: Cardiology | Admitting: Cardiology

## 2023-07-20 ENCOUNTER — Encounter: Payer: Self-pay | Admitting: Cardiology

## 2023-07-20 VITALS — BP 110/60 | HR 62 | Ht 61.0 in | Wt 108.8 lb

## 2023-07-20 DIAGNOSIS — Z951 Presence of aortocoronary bypass graft: Secondary | ICD-10-CM | POA: Diagnosis present

## 2023-07-20 DIAGNOSIS — E088 Diabetes mellitus due to underlying condition with unspecified complications: Secondary | ICD-10-CM | POA: Insufficient documentation

## 2023-07-20 DIAGNOSIS — R001 Bradycardia, unspecified: Secondary | ICD-10-CM | POA: Diagnosis present

## 2023-07-20 DIAGNOSIS — Z1321 Encounter for screening for nutritional disorder: Secondary | ICD-10-CM | POA: Diagnosis present

## 2023-07-20 DIAGNOSIS — I10 Essential (primary) hypertension: Secondary | ICD-10-CM | POA: Diagnosis present

## 2023-07-20 DIAGNOSIS — I2510 Atherosclerotic heart disease of native coronary artery without angina pectoris: Secondary | ICD-10-CM | POA: Diagnosis not present

## 2023-07-20 MED ORDER — AMLODIPINE BESYLATE 5 MG PO TABS: 5.0000 mg | ORAL_TABLET | Freq: Every day | ORAL | 3 refills | Status: DC

## 2023-07-20 NOTE — Patient Instructions (Signed)
Medication Instructions:  Your physician has recommended you make the following change in your medication:   START: Amlodipine 5 mg daily  *If you need a refill on your cardiac medications before your next appointment, please call your pharmacy*   Lab Work: Your physician recommends that you return for lab work in:   Labs today: BMP, CBC, TSH, LFT, Lipids, Hbg A1c  If you have labs (blood work) drawn today and your tests are completely normal, you will receive your results only by: MyChart Message (if you have MyChart) OR A paper copy in the mail If you have any lab test that is abnormal or we need to change your treatment, we will call you to review the results.   Testing/Procedures: None   Follow-Up: At [facility], you and your health needs are our priority.  As part of our continuing mission to provide you with exceptional heart care, we have created designated Provider Care Teams.  These Care Teams include your primary Cardiologist (physician) and Advanced Practice Providers (APPs -  Physician Assistants and Nurse Practitioners) who all work together to provide you with the care you need, when you need it.  We recommend signing up for the patient portal called "MyChart".  Sign up information is provided on this After Visit Summary.  MyChart is used to connect with patients for Virtual Visits (Telemedicine).  Patients are able to view lab/test results, encounter notes, upcoming appointments, etc.  Non-urgent messages can be sent to your provider as well.   To learn more about what you can do with MyChart, go to [email or web address].    Your next appointment:   9 month(s)  Provider:   Belva Crome, MD    Other Instructions None
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
## 2023-07-21 LAB — LIPID PANEL
Chol/HDL Ratio: 4 ratio (ref 0.0–4.4)
Cholesterol, Total: 172 mg/dL (ref 100–199)
HDL: 43 mg/dL (ref >39)
LDL Chol Calc (NIH): 92 mg/dL (ref 0–99)
Triglycerides: 216 mg/dL — ABNORMAL HIGH (ref 0–149)
VLDL Cholesterol Cal: 37 mg/dL (ref 5–40)

## 2023-07-21 LAB — BASIC METABOLIC PANEL
BUN/Creatinine Ratio: 24 (ref 12–28)
BUN: 38 mg/dL — ABNORMAL HIGH (ref 8–27)
CO2: 19 mmol/L — ABNORMAL LOW (ref 20–29)
Calcium: 10.2 mg/dL (ref 8.7–10.3)
Chloride: 102 mmol/L (ref 96–106)
Creatinine, Ser: 1.57 mg/dL — ABNORMAL HIGH (ref 0.57–1.00)
Glucose: 115 mg/dL — ABNORMAL HIGH (ref 70–99)
Potassium: 4.9 mmol/L (ref 3.5–5.2)
Sodium: 137 mmol/L (ref 134–144)
eGFR: 35 mL/min/{1.73_m2} — ABNORMAL LOW (ref >59)

## 2023-07-21 LAB — HEPATIC FUNCTION PANEL
ALT: 14 IU/L (ref 0–32)
AST: 20 IU/L (ref 0–40)
Albumin: 4.5 g/dL (ref 3.9–4.9)
Alkaline Phosphatase: 35 IU/L — ABNORMAL LOW (ref 44–121)
Bilirubin Total: <0.2 mg/dL (ref 0.0–1.2)
Bilirubin, Direct: 0.1 mg/dL (ref 0.00–0.40)
Total Protein: 7 g/dL (ref 6.0–8.5)

## 2023-07-21 LAB — CBC
Hematocrit: 31.3 % — ABNORMAL LOW (ref 34.0–46.6)
Hemoglobin: 9.7 g/dL — ABNORMAL LOW (ref 11.1–15.9)
MCH: 26.9 pg (ref 26.6–33.0)
MCHC: 31 g/dL — ABNORMAL LOW (ref 31.5–35.7)
MCV: 87 fL (ref 79–97)
Platelets: 357 10*3/uL (ref 150–450)
RBC: 3.6 x10E6/uL — ABNORMAL LOW (ref 3.77–5.28)
RDW: 13.2 % (ref 11.7–15.4)
WBC: 5 10*3/uL (ref 3.4–10.8)

## 2023-07-21 LAB — HEMOGLOBIN A1C
Est. average glucose Bld gHb Est-mCnc: 140 mg/dL
Hgb A1c MFr Bld: 6.5 % — ABNORMAL HIGH (ref 4.8–5.6)

## 2023-07-21 LAB — TSH: TSH: 4.15 u[IU]/mL (ref 0.450–4.500)

## 2023-07-25 ENCOUNTER — Telehealth: Payer: Self-pay | Admitting: Cardiology

## 2023-07-25 NOTE — Telephone Encounter (Signed)
Medication has been removed as requested. Nurse visit changed.

## 2023-07-25 NOTE — Telephone Encounter (Signed)
Patient is calling to get her nurse visit r/s. Pleaese advise   Pt c/o medication issue:  1. Name of Medication: Bempedoic Acid-Ezetimibe (NEXLIZET) 180-10 MG TABS   2. How are you currently taking this medication (dosage and times per day)?    3. Are you having a reaction (difficulty breathing--STAT)? no  4. What is your medication issue? Patient states that she is not taking medication. Please advise

## 2023-07-26 ENCOUNTER — Ambulatory Visit: Payer: Medicare Other

## 2023-07-30 ENCOUNTER — Ambulatory Visit: Payer: Medicare Other | Admitting: Medical

## 2023-07-31 ENCOUNTER — Encounter: Payer: Self-pay | Admitting: Medical

## 2023-07-31 ENCOUNTER — Ambulatory Visit (INDEPENDENT_AMBULATORY_CARE_PROVIDER_SITE_OTHER): Payer: Medicare Other | Admitting: Medical

## 2023-07-31 VITALS — BP 130/60 | HR 66 | Temp 98.8°F | Resp 18 | Ht 61.0 in | Wt 111.4 lb

## 2023-07-31 DIAGNOSIS — R5383 Other fatigue: Secondary | ICD-10-CM

## 2023-07-31 DIAGNOSIS — E538 Deficiency of other specified B group vitamins: Secondary | ICD-10-CM

## 2023-07-31 DIAGNOSIS — E559 Vitamin D deficiency, unspecified: Secondary | ICD-10-CM

## 2023-07-31 DIAGNOSIS — E785 Hyperlipidemia, unspecified: Secondary | ICD-10-CM | POA: Diagnosis not present

## 2023-07-31 DIAGNOSIS — S46811A Strain of other muscles, fascia and tendons at shoulder and upper arm level, right arm, initial encounter: Secondary | ICD-10-CM

## 2023-07-31 DIAGNOSIS — J3489 Other specified disorders of nose and nasal sinuses: Secondary | ICD-10-CM

## 2023-07-31 DIAGNOSIS — D649 Anemia, unspecified: Secondary | ICD-10-CM

## 2023-07-31 DIAGNOSIS — Z23 Encounter for immunization: Secondary | ICD-10-CM | POA: Diagnosis not present

## 2023-07-31 DIAGNOSIS — R790 Abnormal level of blood mineral: Secondary | ICD-10-CM

## 2023-07-31 DIAGNOSIS — E1169 Type 2 diabetes mellitus with other specified complication: Secondary | ICD-10-CM

## 2023-07-31 LAB — CBC WITH DIFFERENTIAL/PLATELET
Basophils Absolute: 0 10*3/uL (ref 0.0–0.1)
Basophils Relative: 0.5 % (ref 0.0–3.0)
Eosinophils Absolute: 0.1 10*3/uL (ref 0.0–0.7)
Eosinophils Relative: 2.1 % (ref 0.0–5.0)
HCT: 31.1 % — ABNORMAL LOW (ref 36.0–46.0)
Hemoglobin: 9.7 g/dL — ABNORMAL LOW (ref 12.0–15.0)
Lymphocytes Relative: 16.8 % (ref 12.0–46.0)
Lymphs Abs: 0.9 10*3/uL (ref 0.7–4.0)
MCHC: 31.1 g/dL (ref 30.0–36.0)
MCV: 84.6 fl (ref 78.0–100.0)
Monocytes Absolute: 0.3 10*3/uL (ref 0.1–1.0)
Monocytes Relative: 5.2 % (ref 3.0–12.0)
Neutro Abs: 4 10*3/uL (ref 1.4–7.7)
Neutrophils Relative %: 75.4 % (ref 43.0–77.0)
Platelets: 381 10*3/uL (ref 150.0–400.0)
RBC: 3.68 Mil/uL — ABNORMAL LOW (ref 3.87–5.11)
RDW: 14.2 % (ref 11.5–15.5)
WBC: 5.3 10*3/uL (ref 4.0–10.5)

## 2023-07-31 LAB — VITAMIN D 25 HYDROXY (VIT D DEFICIENCY, FRACTURES): VITD: 33.88 ng/mL (ref 30.00–100.00)

## 2023-07-31 LAB — MAGNESIUM: Magnesium: 2.1 mg/dL (ref 1.5–2.5)

## 2023-07-31 LAB — VITAMIN B12: Vitamin B-12: 213 pg/mL (ref 211–911)

## 2023-07-31 MED ORDER — FLUTICASONE PROPIONATE 50 MCG/ACT NA SUSP
2.0000 | Freq: Every day | NASAL | 1 refills | Status: DC
Start: 2023-07-31 — End: 2023-08-23

## 2023-07-31 MED ORDER — CYCLOBENZAPRINE HCL 5 MG PO TABS: 5.0000 mg | ORAL_TABLET | Freq: Every day | ORAL | 0 refills | Status: DC

## 2023-07-31 NOTE — Patient Instructions (Addendum)
1. Hyperlipidemia associated with type 2 diabetes mellitus (HCC) Continue meds rx by cardiologist. -continue levimir and follow up with endocrinlogist.  2. Fatigue, unspecified type - CBC w/Diff - B12 - Vitamin B1  3. B12 deficiency -recheck level.  4. Low magnesium level - Magnesium  5. Vitamin D deficiency -if low will send rx to supplament - VITAMIN D 25 Hydroxy (Vit-D Deficiency, Fractures)  6. Sinus pressure Flonase nasal spray as do think possible allergy related.  7. Strain of right trapezius muscle, initial encounter -rx low dose flexeril to use 5 mg only at night. 3 tab rx givne.    On review if you think devleoping ha let me know or if neck pain worsens.  Follow up date to be determined after lab review.
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
## 2023-07-31 NOTE — Addendum Note (Signed)
Addended by: Gwenevere Abbot on: 07/31/2023 09:00 PM   Modules accepted: Orders

## 2023-07-31 NOTE — Addendum Note (Signed)
Addended by: Roxanne Gates on: 07/31/2023 11:56 AM   Modules accepted: Orders

## 2023-08-01 ENCOUNTER — Other Ambulatory Visit (INDEPENDENT_AMBULATORY_CARE_PROVIDER_SITE_OTHER): Payer: Medicare Other

## 2023-08-01 DIAGNOSIS — D649 Anemia, unspecified: Secondary | ICD-10-CM

## 2023-08-01 LAB — IBC + FERRITIN
Ferritin: 36.2 ng/mL (ref 10.0–291.0)
Iron: 76 ug/dL (ref 42–145)
Saturation Ratios: 14.3 % — ABNORMAL LOW (ref 20.0–50.0)
TIBC: 532 ug/dL — ABNORMAL HIGH (ref 250.0–450.0)
Transferrin: 380 mg/dL — ABNORMAL HIGH (ref 212.0–360.0)

## 2023-08-02 ENCOUNTER — Telehealth: Payer: Self-pay

## 2023-08-02 ENCOUNTER — Ambulatory Visit: Payer: Medicare Other

## 2023-08-02 DIAGNOSIS — Z5181 Encounter for therapeutic drug level monitoring: Secondary | ICD-10-CM | POA: Diagnosis not present

## 2023-08-02 DIAGNOSIS — I10 Essential (primary) hypertension: Secondary | ICD-10-CM | POA: Diagnosis present

## 2023-08-02 NOTE — Telephone Encounter (Signed)
Spoke with Jasmine Keith per Dr. Kem Parkinson message advised to stop Amlodipine and let us know next week how she is feeling and her BP and pulse readings. Jasmine Keith verbalized understanding and had no further questions.

## 2023-08-02 NOTE — Progress Notes (Signed)
Nurse Visit   Date of Encounter: 08/02/2023 ID: Jasmine Keith, DOB Nov 23, 1950, MRN [id number]  PCP:  Marisue Brooklyn   [facility] Providers Cardiologist:  Garwin Brothers, MD      Visit Details   VS:  BP (!) 128/48 (BP Location: Left Arm, Patient Position: Sitting)   Pulse 70   Ht 5\' 1"  (1.549 m)   Wt 111 lb 3.2 oz (50.4 kg)   SpO2 95%   BMI 21.01 kg/m  , BMI Body mass index is 21.01 kg/m.  Wt Readings from Last 3 Encounters:  08/02/23 111 lb 3.2 oz (50.4 kg)  07/31/23 111 lb 6.4 oz (50.5 kg)  07/20/23 108 lb 12.8 oz (49.4 kg)     Reason for visit: BP and Pulse check after Amlodipine decreased to 5mg  q d Performed today: Vitals Changes (medications, testing, etc.) : Per Dr. Tomie China "Tell her to stop amlodipine and let us know early next week how she feels and her pulse and blood pressure readings" Length of Visit: 15 minutes Pt was seen this week by PCP. She was given Flonase and Flexeril for neck spasms. She is feeling fatigue and some lightheadedness.    Medications Adjustments/Labs and Tests Ordered: No orders of the defined types were placed in this encounter.  No orders of the defined types were placed in this encounter.    Signed, Neena Rhymes, RN  08/02/2023 1:27 PM

## 2023-08-03 LAB — VITAMIN B1: Vitamin B1 (Thiamine): 10 nmol/L (ref 8–30)

## 2023-08-03 NOTE — Addendum Note (Signed)
Addended by: Gwenevere Abbot on: 08/03/2023 05:39 PM   Modules accepted: Orders

## 2023-08-03 NOTE — Addendum Note (Signed)
Addended by: Gwenevere Abbot on: 08/03/2023 05:28 PM   Modules accepted: Orders

## 2023-08-05 ENCOUNTER — Other Ambulatory Visit: Payer: Self-pay | Admitting: Cardiology

## 2023-08-05 ENCOUNTER — Other Ambulatory Visit: Payer: Self-pay | Admitting: Medical

## 2023-08-06 ENCOUNTER — Other Ambulatory Visit: Payer: Self-pay | Admitting: Physician Assistant

## 2023-08-06 ENCOUNTER — Other Ambulatory Visit: Payer: Self-pay | Admitting: Medical

## 2023-08-23 ENCOUNTER — Other Ambulatory Visit: Payer: Self-pay | Admitting: Medical

## 2023-08-27 ENCOUNTER — Telehealth: Payer: Self-pay | Admitting: Medical

## 2023-08-27 MED ORDER — GABAPENTIN 300 MG PO CAPS: 300.0000 mg | ORAL_CAPSULE | Freq: Two times a day (BID) | ORAL | 0 refills | Status: DC

## 2023-08-27 NOTE — Telephone Encounter (Signed)
Pt requesting refill on gabapentin. Please send to [facility] on [address] in [place].

## 2023-08-27 NOTE — Addendum Note (Signed)
Addended by: Maximino Sarin on: 08/27/2023 10:06 AM   Modules accepted: Orders

## 2023-08-27 NOTE — Telephone Encounter (Signed)
Rx sent 

## 2023-09-03 ENCOUNTER — Inpatient Hospital Stay (HOSPITAL_BASED_OUTPATIENT_CLINIC_OR_DEPARTMENT_OTHER): Payer: Medicare Other | Admitting: Hematology & Oncology

## 2023-09-03 ENCOUNTER — Inpatient Hospital Stay: Payer: Medicare Other | Attending: Hematology & Oncology

## 2023-09-03 VITALS — BP 152/47 | HR 72 | Resp 16 | Ht 61.0 in | Wt 111.0 lb

## 2023-09-03 DIAGNOSIS — Z87891 Personal history of nicotine dependence: Secondary | ICD-10-CM | POA: Insufficient documentation

## 2023-09-03 DIAGNOSIS — E1122 Type 2 diabetes mellitus with diabetic chronic kidney disease: Secondary | ICD-10-CM

## 2023-09-03 DIAGNOSIS — N189 Chronic kidney disease, unspecified: Secondary | ICD-10-CM

## 2023-09-03 DIAGNOSIS — Z801 Family history of malignant neoplasm of trachea, bronchus and lung: Secondary | ICD-10-CM | POA: Insufficient documentation

## 2023-09-03 DIAGNOSIS — E0837X3 Diabetes mellitus due to underlying condition with diabetic macular edema, resolved following treatment, bilateral: Secondary | ICD-10-CM

## 2023-09-03 DIAGNOSIS — Z9884 Bariatric surgery status: Secondary | ICD-10-CM | POA: Diagnosis not present

## 2023-09-03 DIAGNOSIS — Z87440 Personal history of urinary (tract) infections: Secondary | ICD-10-CM | POA: Insufficient documentation

## 2023-09-03 DIAGNOSIS — D649 Anemia, unspecified: Secondary | ICD-10-CM | POA: Insufficient documentation

## 2023-09-03 DIAGNOSIS — D631 Anemia in chronic kidney disease: Secondary | ICD-10-CM

## 2023-09-03 DIAGNOSIS — Z794 Long term (current) use of insulin: Secondary | ICD-10-CM

## 2023-09-03 DIAGNOSIS — N179 Acute kidney failure, unspecified: Secondary | ICD-10-CM

## 2023-09-03 DIAGNOSIS — D500 Iron deficiency anemia secondary to blood loss (chronic): Secondary | ICD-10-CM

## 2023-09-03 LAB — CMP (CANCER CENTER ONLY)
ALT: 14 U/L (ref 0–44)
AST: 23 U/L (ref 15–41)
Albumin: 4.7 g/dL (ref 3.5–5.0)
Alkaline Phosphatase: 36 U/L — ABNORMAL LOW (ref 38–126)
Anion gap: 12 (ref 5–15)
BUN: 32 mg/dL — ABNORMAL HIGH (ref 8–23)
CO2: 21 mmol/L — ABNORMAL LOW (ref 22–32)
Calcium: 9 mg/dL (ref 8.9–10.3)
Chloride: 105 mmol/L (ref 98–111)
Creatinine: 1.71 mg/dL — ABNORMAL HIGH (ref 0.44–1.00)
GFR, Estimated: 32 mL/min — ABNORMAL LOW (ref >60)
Glucose, Bld: 186 mg/dL — ABNORMAL HIGH (ref 70–99)
Potassium: 4.5 mmol/L (ref 3.5–5.1)
Sodium: 138 mmol/L (ref 135–145)
Total Bilirubin: 0.3 mg/dL (ref 0.3–1.2)
Total Protein: 6.7 g/dL (ref 6.5–8.1)

## 2023-09-03 LAB — CBC WITH DIFFERENTIAL (CANCER CENTER ONLY)
Abs Immature Granulocytes: 0.03 10*3/uL (ref 0.00–0.07)
Basophils Absolute: 0 10*3/uL (ref 0.0–0.1)
Basophils Relative: 1 %
Eosinophils Absolute: 0.1 10*3/uL (ref 0.0–0.5)
Eosinophils Relative: 2 %
HCT: 29.6 % — ABNORMAL LOW (ref 36.0–46.0)
Hemoglobin: 9.3 g/dL — ABNORMAL LOW (ref 12.0–15.0)
Immature Granulocytes: 1 %
Lymphocytes Relative: 14 %
Lymphs Abs: 0.7 10*3/uL (ref 0.7–4.0)
MCH: 26.7 pg (ref 26.0–34.0)
MCHC: 31.4 g/dL (ref 30.0–36.0)
MCV: 85.1 fL (ref 80.0–100.0)
Monocytes Absolute: 0.2 10*3/uL (ref 0.1–1.0)
Monocytes Relative: 4 %
Neutro Abs: 4.2 10*3/uL (ref 1.7–7.7)
Neutrophils Relative %: 78 %
Platelet Count: 323 10*3/uL (ref 150–400)
RBC: 3.48 MIL/uL — ABNORMAL LOW (ref 3.87–5.11)
RDW: 14.1 % (ref 11.5–15.5)
WBC Count: 5.4 10*3/uL (ref 4.0–10.5)
nRBC: 0 % (ref 0.0–0.2)

## 2023-09-03 LAB — RETICULOCYTES
Immature Retic Fract: 12.2 % (ref 2.3–15.9)
RBC.: 3.45 MIL/uL — ABNORMAL LOW (ref 3.87–5.11)
Retic Count, Absolute: 63.1 10*3/uL (ref 19.0–186.0)
Retic Ct Pct: 1.8 % (ref 0.4–3.1)

## 2023-09-03 LAB — FERRITIN: Ferritin: 31 ng/mL (ref 11–307)

## 2023-09-03 LAB — VITAMIN B12: Vitamin B-12: 226 pg/mL (ref 180–914)

## 2023-09-03 LAB — LACTATE DEHYDROGENASE: LDH: 177 U/L (ref 98–192)

## 2023-09-03 LAB — SAVE SMEAR(SSMR), FOR PROVIDER SLIDE REVIEW
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
## 2023-09-04 ENCOUNTER — Ambulatory Visit: Payer: Medicare Other | Admitting: Medical

## 2023-09-04 ENCOUNTER — Encounter: Payer: Self-pay | Admitting: Hematology & Oncology

## 2023-09-04 ENCOUNTER — Telehealth: Payer: Self-pay | Admitting: *Deleted

## 2023-09-04 DIAGNOSIS — D500 Iron deficiency anemia secondary to blood loss (chronic): Secondary | ICD-10-CM | POA: Insufficient documentation

## 2023-09-04 DIAGNOSIS — D631 Anemia in chronic kidney disease: Secondary | ICD-10-CM | POA: Insufficient documentation

## 2023-09-04 HISTORY — DX: Anemia in chronic kidney disease: D63.1

## 2023-09-04 HISTORY — DX: Iron deficiency anemia secondary to blood loss (chronic): D50.0

## 2023-09-04 LAB — IGG, IGA, IGM
IgA: 210 mg/dL (ref 87–352)
IgG (Immunoglobin G), Serum: 850 mg/dL (ref 586–1602)
IgM (Immunoglobulin M), Srm: 93 mg/dL (ref 26–217)

## 2023-09-04 LAB — KAPPA/LAMBDA LIGHT CHAINS
Kappa free light chain: 64.3 mg/L — ABNORMAL HIGH (ref 3.3–19.4)
Kappa, lambda light chain ratio: 1.27 (ref 0.26–1.65)
Lambda free light chains: 50.6 mg/L — ABNORMAL HIGH (ref 5.7–26.3)

## 2023-09-04 LAB — ERYTHROPOIETIN: Erythropoietin: 12.3 m[IU]/mL (ref 2.6–18.5)

## 2023-09-04 LAB — IRON AND IRON BINDING CAPACITY (CC-WL,HP ONLY)
Iron: 74 ug/dL (ref 28–170)
Saturation Ratios: 14 % (ref 10.4–31.8)
TIBC: 540 ug/dL — ABNORMAL HIGH (ref 250–450)
UIBC: 466 ug/dL — ABNORMAL HIGH (ref 148–442)

## 2023-09-04 NOTE — Telephone Encounter (Signed)
Pt notified. Message to scheduling.

## 2023-09-04 NOTE — Telephone Encounter (Signed)
-----   Message from Josph Macho sent at 09/04/2023  2:02 PM EDT ----- Please call and let her know that the iron is low.  The erythropoietin level (EPO) is low.  We certainly should be able to get her anemia resolved.  We will call her when to come in for iron and Aranesp.  Cindee Lame

## 2023-09-04 NOTE — Addendum Note (Signed)
Addended by: Arlan Organ R on: 09/04/2023 02:06 PM   Modules accepted: Orders

## 2023-09-05 ENCOUNTER — Ambulatory Visit: Payer: Medicare Other | Admitting: Medical

## 2023-09-05 LAB — PROTEIN ELECTROPHORESIS, SERUM, WITH REFLEX
A/G Ratio: 1.2 (ref 0.7–1.7)
Albumin ELP: 3.7 g/dL (ref 2.9–4.4)
Alpha-1-Globulin: 0.3 g/dL (ref 0.0–0.4)
Alpha-2-Globulin: 0.8 g/dL (ref 0.4–1.0)
Beta Globulin: 1.1 g/dL (ref 0.7–1.3)
Gamma Globulin: 0.9 g/dL (ref 0.4–1.8)
Globulin, Total: 3.2 g/dL (ref 2.2–3.9)
Total Protein ELP: 6.9 g/dL (ref 6.0–8.5)

## 2023-09-07 ENCOUNTER — Ambulatory Visit (INDEPENDENT_AMBULATORY_CARE_PROVIDER_SITE_OTHER): Payer: Medicare Other | Admitting: Medical

## 2023-09-07 ENCOUNTER — Other Ambulatory Visit: Payer: Self-pay | Admitting: Medical

## 2023-09-07 VITALS — BP 132/70 | HR 72 | Temp 98.0°F | Resp 18 | Ht 61.0 in | Wt 110.6 lb

## 2023-09-07 DIAGNOSIS — E1169 Type 2 diabetes mellitus with other specified complication: Secondary | ICD-10-CM | POA: Diagnosis not present

## 2023-09-07 DIAGNOSIS — R944 Abnormal results of kidney function studies: Secondary | ICD-10-CM

## 2023-09-07 DIAGNOSIS — E1100 Type 2 diabetes mellitus with hyperosmolarity without nonketotic hyperglycemic-hyperosmolar coma (NKHHC): Secondary | ICD-10-CM | POA: Diagnosis not present

## 2023-09-07 DIAGNOSIS — D649 Anemia, unspecified: Secondary | ICD-10-CM | POA: Diagnosis not present

## 2023-09-07 DIAGNOSIS — I10 Essential (primary) hypertension: Secondary | ICD-10-CM

## 2023-09-07 DIAGNOSIS — Z794 Long term (current) use of insulin: Secondary | ICD-10-CM

## 2023-09-07 DIAGNOSIS — E785 Hyperlipidemia, unspecified: Secondary | ICD-10-CM

## 2023-09-07 MED ORDER — INSULIN DETEMIR 100 UNIT/ML FLEXPEN: PEN_INJECTOR | SUBCUTANEOUS | 11 refills | Status: DC

## 2023-09-07 MED ORDER — INSULIN GLARGINE SOLOSTAR 100 UNIT/ML ~~LOC~~ SOPN: 8.0000 [IU] | PEN_INJECTOR | Freq: Every day | SUBCUTANEOUS | 11 refills | Status: DC

## 2023-09-07 MED ORDER — PEN NEEDLES 3/16" 31G X 5 MM MISC: 8.0000 [IU] | Freq: Every day | 3 refills | Status: AC

## 2023-09-07 NOTE — Patient Instructions (Addendum)
1. Decreased GFR -stop metformin currently due to low gfr. Hydrate well and repeat cmp next week. Make sure well hydrated. - Ambulatory referral to Nephrology - Comp Met (CMET); Future  2. Hyperlipidemia associated with type 2 diabetes mellitus (HCC) -on fenofribrate and repatha.  3. Anemia, unspecified type -follow up with Dr. Drue Dun  4. Hypertension, unspecified type -bp well controlled. Continue current regimen.  5. Type 2 diabetes mellitus with hyperosmolarity without coma, without long-term current use of insulin (HCC) -levimir 8 units daily. Check sugars daily in morning fasting. If sugar over 110 add one unit to current regimen. If less than 110 not to add unit. - Comp Met (CMET); Future   Follow up date to be determined after lab review.

## 2023-09-07 NOTE — Addendum Note (Signed)
Addended by: Gwenevere Abbot on: 09/07/2023 09:50 PM   Modules accepted: Orders
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
## 2023-09-07 NOTE — Telephone Encounter (Signed)
Levemir not covered by insurance  Preferred alternatives: Lantus Evaristo Bury

## 2023-09-08 ENCOUNTER — Other Ambulatory Visit: Payer: Self-pay | Admitting: Medical

## 2023-09-10 ENCOUNTER — Inpatient Hospital Stay: Payer: Medicare Other | Attending: Hematology & Oncology

## 2023-09-10 VITALS — BP 140/48 | HR 66 | Temp 98.1°F | Resp 20

## 2023-09-10 DIAGNOSIS — D500 Iron deficiency anemia secondary to blood loss (chronic): Secondary | ICD-10-CM

## 2023-09-10 DIAGNOSIS — D631 Anemia in chronic kidney disease: Secondary | ICD-10-CM | POA: Diagnosis not present

## 2023-09-10 DIAGNOSIS — N189 Chronic kidney disease, unspecified: Secondary | ICD-10-CM | POA: Insufficient documentation

## 2023-09-10 DIAGNOSIS — E611 Iron deficiency: Secondary | ICD-10-CM | POA: Insufficient documentation

## 2023-09-10 DIAGNOSIS — D509 Iron deficiency anemia, unspecified: Secondary | ICD-10-CM | POA: Diagnosis present

## 2023-09-10 MED ORDER — DARBEPOETIN ALFA 300 MCG/0.6ML IJ SOSY
300.0000 ug | PREFILLED_SYRINGE | Freq: Once | INTRAMUSCULAR | Status: AC
  Administered 2023-09-10: 300 ug via SUBCUTANEOUS
  Filled 2023-09-10: qty 0.6

## 2023-09-10 NOTE — Patient Instructions (Signed)

## 2023-09-10 NOTE — Telephone Encounter (Signed)
Pharmacy comment: Alternative Requested:THE PRESCRIBED MEDICATION IS NOT COVERED BY INSURANCE. PLEASE CONSIDER CHANGING TO ONE OF THE SUGGESTED COVERED ALTERNATIVES.

## 2023-09-10 NOTE — Telephone Encounter (Signed)
Duplicate

## 2023-09-13 ENCOUNTER — Inpatient Hospital Stay: Payer: Medicare Other

## 2023-09-13 ENCOUNTER — Telehealth: Payer: Self-pay | Admitting: Medical

## 2023-09-13 ENCOUNTER — Other Ambulatory Visit (INDEPENDENT_AMBULATORY_CARE_PROVIDER_SITE_OTHER): Payer: Medicare Other

## 2023-09-13 VITALS — BP 131/41 | HR 80 | Temp 97.8°F | Resp 18

## 2023-09-13 DIAGNOSIS — E1100 Type 2 diabetes mellitus with hyperosmolarity without nonketotic hyperglycemic-hyperosmolar coma (NKHHC): Secondary | ICD-10-CM

## 2023-09-13 DIAGNOSIS — D631 Anemia in chronic kidney disease: Secondary | ICD-10-CM

## 2023-09-13 DIAGNOSIS — R944 Abnormal results of kidney function studies: Secondary | ICD-10-CM | POA: Diagnosis not present

## 2023-09-13 DIAGNOSIS — D500 Iron deficiency anemia secondary to blood loss (chronic): Secondary | ICD-10-CM

## 2023-09-13 DIAGNOSIS — D509 Iron deficiency anemia, unspecified: Secondary | ICD-10-CM | POA: Diagnosis not present

## 2023-09-13 LAB — COMPREHENSIVE METABOLIC PANEL
ALT: 14 U/L (ref 0–35)
AST: 22 U/L (ref 0–37)
Albumin: 4 g/dL (ref 3.5–5.2)
Alkaline Phosphatase: 39 U/L (ref 39–117)
BUN: 27 mg/dL — ABNORMAL HIGH (ref 6–23)
CO2: 22 meq/L (ref 19–32)
Calcium: 9.5 mg/dL (ref 8.4–10.5)
Chloride: 103 meq/L (ref 96–112)
Creatinine, Ser: 1.4 mg/dL — ABNORMAL HIGH (ref 0.40–1.20)
GFR: 38.02 mL/min — ABNORMAL LOW (ref >60.00)
Glucose, Bld: 261 mg/dL — ABNORMAL HIGH (ref 70–99)
Potassium: 4 meq/L (ref 3.5–5.1)
Sodium: 133 meq/L — ABNORMAL LOW (ref 135–145)
Total Bilirubin: 0.3 mg/dL (ref 0.2–1.2)
Total Protein: 6.8 g/dL (ref 6.0–8.3)

## 2023-09-13 MED ORDER — LORATADINE 10 MG PO TABS
10.0000 mg | ORAL_TABLET | Freq: Once | ORAL | Status: AC
  Administered 2023-09-13: 10 mg via ORAL
  Filled 2023-09-13: qty 1

## 2023-09-13 MED ORDER — ACETAMINOPHEN 325 MG PO TABS
325.0000 mg | ORAL_TABLET | Freq: Once | ORAL | Status: AC
Start: 2023-09-13 — End: 2023-09-13
  Administered 2023-09-13: 325 mg via ORAL
  Filled 2023-09-13: qty 1

## 2023-09-13 MED ORDER — SODIUM CHLORIDE 0.9 % IV SOLN: Freq: Once | INTRAVENOUS | Status: AC

## 2023-09-13 MED ORDER — CETIRIZINE HCL 10 MG PO TABS
10.0000 mg | ORAL_TABLET | Freq: Once | ORAL | Status: DC
  Filled 2023-09-13: qty 1

## 2023-09-13 MED ORDER — SODIUM CHLORIDE 0.9 % IV SOLN
1000.0000 mg | Freq: Once | INTRAVENOUS | Status: AC
  Administered 2023-09-13: 1000 mg via INTRAVENOUS
  Filled 2023-09-13: qty 10

## 2023-09-13 NOTE — Telephone Encounter (Signed)
Pt came in office to get labs done (pt was scheduled from pcp orders, pt's spouse stated that from last visit provider informed pt was to stop her metformin but pt verified sugar levels and it was very high in the morning (200) which it regularly is never that high, spouse would like to know if pt is still needing to be off metformin. Please advise. Pt tel [phone]

## 2023-09-13 NOTE — Telephone Encounter (Signed)
Please advise 

## 2023-09-13 NOTE — Patient Instructions (Signed)

## 2023-10-11 ENCOUNTER — Ambulatory Visit: Payer: Medicare Other

## 2023-10-11 VITALS — BP 148/70 | HR 66 | Temp 97.8°F | Resp 16 | Ht 61.0 in | Wt 115.2 lb

## 2023-10-11 DIAGNOSIS — Z951 Presence of aortocoronary bypass graft: Secondary | ICD-10-CM

## 2023-10-11 DIAGNOSIS — E782 Mixed hyperlipidemia: Secondary | ICD-10-CM

## 2023-10-11 MED ORDER — INCLISIRAN SODIUM 284 MG/1.5ML ~~LOC~~ SOSY
284.0000 mg | PREFILLED_SYRINGE | Freq: Once | SUBCUTANEOUS | Status: AC
  Administered 2023-10-11: 284 mg via SUBCUTANEOUS
  Filled 2023-10-11: qty 1.5

## 2023-10-11 NOTE — Progress Notes (Signed)
Diagnosis: Hyperlipidemia  Provider:  Chilton Greathouse MD  Procedure: Injection  Leqvio (inclisiran), Dose: 284 mg, Site: subcutaneous, Number of injections: 1  Administered in left arm.  Post Care: Patient declined observation  Discharge: Condition: Good, Destination: Home . AVS Provided  Performed by:  Wyvonne Lenz, RN

## 2023-10-16 ENCOUNTER — Other Ambulatory Visit: Payer: Self-pay | Admitting: Medical

## 2023-10-19 ENCOUNTER — Telehealth: Payer: Self-pay | Admitting: Pharmacist

## 2023-10-19 DIAGNOSIS — E782 Mixed hyperlipidemia: Secondary | ICD-10-CM

## 2023-10-19 DIAGNOSIS — I2510 Atherosclerotic heart disease of native coronary artery without angina pectoris: Secondary | ICD-10-CM

## 2023-10-19 NOTE — Telephone Encounter (Signed)
Spoke with patient. She is doing well on Leqvio. She will go to lab in the next few weeks for lipid panel.

## 2023-11-08 ENCOUNTER — Other Ambulatory Visit: Payer: Self-pay | Admitting: Medical

## 2023-11-08 ENCOUNTER — Other Ambulatory Visit: Payer: Self-pay

## 2023-11-08 MED ORDER — BUSPIRONE HCL 7.5 MG PO TABS: 7.5000 mg | ORAL_TABLET | Freq: Two times a day (BID) | ORAL | 0 refills | Status: DC

## 2023-11-09 ENCOUNTER — Other Ambulatory Visit: Payer: Self-pay | Admitting: Medical

## 2023-11-12 ENCOUNTER — Ambulatory Visit: Payer: Medicare Other | Admitting: Medical

## 2023-11-13 ENCOUNTER — Ambulatory Visit: Payer: Medicare Other | Admitting: Medical

## 2023-11-13 ENCOUNTER — Encounter: Payer: Self-pay | Admitting: Medical Oncology

## 2023-11-13 ENCOUNTER — Inpatient Hospital Stay: Payer: Medicare Other | Attending: Hematology & Oncology

## 2023-11-13 ENCOUNTER — Inpatient Hospital Stay (HOSPITAL_BASED_OUTPATIENT_CLINIC_OR_DEPARTMENT_OTHER): Payer: Medicare Other | Admitting: Medical Oncology

## 2023-11-13 VITALS — BP 117/41 | HR 58 | Temp 97.8°F | Resp 18 | Ht 61.0 in | Wt 109.4 lb

## 2023-11-13 DIAGNOSIS — E538 Deficiency of other specified B group vitamins: Secondary | ICD-10-CM | POA: Insufficient documentation

## 2023-11-13 DIAGNOSIS — N189 Chronic kidney disease, unspecified: Secondary | ICD-10-CM | POA: Insufficient documentation

## 2023-11-13 DIAGNOSIS — D500 Iron deficiency anemia secondary to blood loss (chronic): Secondary | ICD-10-CM

## 2023-11-13 DIAGNOSIS — D631 Anemia in chronic kidney disease: Secondary | ICD-10-CM

## 2023-11-13 LAB — CBC WITH DIFFERENTIAL (CANCER CENTER ONLY)
Abs Immature Granulocytes: 0.05 10*3/uL (ref 0.00–0.07)
Basophils Absolute: 0.1 10*3/uL (ref 0.0–0.1)
Basophils Relative: 1 %
Eosinophils Absolute: 0.1 10*3/uL (ref 0.0–0.5)
Eosinophils Relative: 3 %
HCT: 35.9 % — ABNORMAL LOW (ref 36.0–46.0)
Hemoglobin: 11.6 g/dL — ABNORMAL LOW (ref 12.0–15.0)
Immature Granulocytes: 1 %
Lymphocytes Relative: 11 %
Lymphs Abs: 0.6 10*3/uL — ABNORMAL LOW (ref 0.7–4.0)
MCH: 28 pg (ref 26.0–34.0)
MCHC: 32.3 g/dL (ref 30.0–36.0)
MCV: 86.5 fL (ref 80.0–100.0)
Monocytes Absolute: 0.3 10*3/uL (ref 0.1–1.0)
Monocytes Relative: 5 %
Neutro Abs: 4.2 10*3/uL (ref 1.7–7.7)
Neutrophils Relative %: 79 %
Platelet Count: 347 10*3/uL (ref 150–400)
RBC: 4.15 MIL/uL (ref 3.87–5.11)
RDW: 14.8 % (ref 11.5–15.5)
WBC Count: 5.3 10*3/uL (ref 4.0–10.5)
nRBC: 0 % (ref 0.0–0.2)

## 2023-11-13 LAB — IRON AND IRON BINDING CAPACITY (CC-WL,HP ONLY)
Iron: 59 ug/dL (ref 28–170)
Saturation Ratios: 18 % (ref 10.4–31.8)
TIBC: 321 ug/dL (ref 250–450)
UIBC: 262 ug/dL (ref 148–442)

## 2023-11-13 LAB — CMP (CANCER CENTER ONLY)
ALT: 12 U/L (ref 0–44)
AST: 29 U/L (ref 15–41)
Albumin: 4.2 g/dL (ref 3.5–5.0)
Alkaline Phosphatase: 43 U/L (ref 38–126)
Anion gap: 9 (ref 5–15)
BUN: 35 mg/dL — ABNORMAL HIGH (ref 8–23)
CO2: 24 mmol/L (ref 22–32)
Calcium: 9.8 mg/dL (ref 8.9–10.3)
Chloride: 102 mmol/L (ref 98–111)
Creatinine: 1.74 mg/dL — ABNORMAL HIGH (ref 0.44–1.00)
GFR, Estimated: 31 mL/min — ABNORMAL LOW (ref >60)
Glucose, Bld: 169 mg/dL — ABNORMAL HIGH (ref 70–99)
Potassium: 4.7 mmol/L (ref 3.5–5.1)
Sodium: 135 mmol/L (ref 135–145)
Total Bilirubin: 0.5 mg/dL (ref 0.0–1.2)
Total Protein: 7.5 g/dL (ref 6.5–8.1)

## 2023-11-13 LAB — FERRITIN: Ferritin: 389 ng/mL — ABNORMAL HIGH (ref 11–307)

## 2023-11-13 NOTE — Patient Instructions (Signed)
 B12 oral supplement 1,000 mcg once daily- This is available over the counter
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
## 2023-11-14 ENCOUNTER — Ambulatory Visit: Payer: Medicare Other | Admitting: Medical

## 2023-11-14 ENCOUNTER — Encounter: Payer: Self-pay | Admitting: Medical

## 2023-11-14 ENCOUNTER — Ambulatory Visit (HOSPITAL_BASED_OUTPATIENT_CLINIC_OR_DEPARTMENT_OTHER)
Admission: RE | Admit: 2023-11-14 | Discharge: 2023-11-14 | Disposition: A | Discharge status: Home / Self Care | Payer: Medicare Other | Source: Ambulatory Visit | Attending: Medical | Admitting: Medical

## 2023-11-14 VITALS — BP 128/49 | HR 65 | Temp 98.1°F | Ht 61.0 in | Wt 110.8 lb

## 2023-11-14 DIAGNOSIS — R82998 Other abnormal findings in urine: Secondary | ICD-10-CM

## 2023-11-14 DIAGNOSIS — M8580 Other specified disorders of bone density and structure, unspecified site: Secondary | ICD-10-CM

## 2023-11-14 DIAGNOSIS — R5383 Other fatigue: Secondary | ICD-10-CM | POA: Diagnosis present

## 2023-11-14 DIAGNOSIS — B349 Viral infection, unspecified: Secondary | ICD-10-CM | POA: Diagnosis not present

## 2023-11-14 DIAGNOSIS — E538 Deficiency of other specified B group vitamins: Secondary | ICD-10-CM | POA: Diagnosis not present

## 2023-11-14 DIAGNOSIS — E119 Type 2 diabetes mellitus without complications: Secondary | ICD-10-CM

## 2023-11-14 DIAGNOSIS — R0609 Other forms of dyspnea: Secondary | ICD-10-CM | POA: Diagnosis present

## 2023-11-14 DIAGNOSIS — E559 Vitamin D deficiency, unspecified: Secondary | ICD-10-CM

## 2023-11-14 LAB — POC URINALSYSI DIPSTICK (AUTOMATED)
Blood, UA: NEGATIVE
Glucose, UA: NEGATIVE
Ketones, UA: NEGATIVE
Nitrite, UA: POSITIVE
Protein, UA: POSITIVE — AB
Spec Grav, UA: 1.01 (ref 1.010–1.025)
Urobilinogen, UA: 0.2 U/dL
pH, UA: 5 (ref 5.0–8.0)

## 2023-11-14 LAB — TSH: TSH: 3.38 u[IU]/mL (ref 0.35–5.50)

## 2023-11-14 LAB — VITAMIN D 25 HYDROXY (VIT D DEFICIENCY, FRACTURES): VITD: 36.82 ng/mL (ref 30.00–100.00)

## 2023-11-14 LAB — POC COVID19 BINAXNOW: SARS Coronavirus 2 Ag: NEGATIVE

## 2023-11-14 LAB — VITAMIN B12: Vitamin B-12: 410 pg/mL (ref 211–911)

## 2023-11-14 LAB — HEMOGLOBIN A1C: Hgb A1c MFr Bld: 9.2 % — ABNORMAL HIGH (ref 4.6–6.5)

## 2023-11-14 LAB — T4, FREE: Free T4: 1.35 ng/dL (ref 0.60–1.60)

## 2023-11-14 MED ORDER — INSULIN GLARGINE SOLOSTAR 100 UNIT/ML ~~LOC~~ SOPN: 30.0000 [IU] | PEN_INJECTOR | Freq: Every day | SUBCUTANEOUS | 11 refills | Status: DC

## 2023-11-14 MED ORDER — CYANOCOBALAMIN 1000 MCG/ML IJ SOLN
1000.0000 ug | Freq: Once | INTRAMUSCULAR | Status: AC
  Administered 2023-11-14: 1000 ug via INTRAMUSCULAR
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
## 2023-11-14 NOTE — Patient Instructions (Signed)
 Fatigue and Weakness New onset, lasting for a week, associated with loss of appetite and body aches. No associated nausea, vomiting, fevers, chills, or sweats. Noted improvement in energy levels after iron infusion a few weeks ago, but symptoms have returned despite normal iron levels. -Order labs including B12, B1, TSH, T4, and Vitamin D  levels. -Collect urine sample to rule out asymptomatic urinary tract infection. -Perform COVID test due to recent travel and onset of body aches and fatigue.(was negative  Iron Deficiency Anemia Improved after iron infusion, with current levels within normal range. -No further intervention needed at this time, pending hematologist's assessment.  Chronic Kidney Disease Stable, with moderate decrease in kidney function. -Continue monitoring kidney function. -Attend upcoming appointment with nephrologist to discuss preservation of kidney function.  Diabetes Elevated blood sugar levels, patient off Metformin  due to kidney function. -Continue monitoring blood sugar levels. -Consider alternative diabetes management strategies if necessary.  Vitamin B12 Deficiency Previously low, currently within normal range. Patient currently taking over-the-counter B12 supplements. -Check B12 levels today. -Administer B12 injection today after blood draw.  Vitamin D  Deficiency Previously low, current status unknown. -Check Vitamin D  levels today. -Discuss supplementation options based on results.  Also get chest xray today due to dyspnea on exertion.  follow up date to be determined after lab review and cxr

## 2023-11-16 LAB — URINE CULTURE
MICRO NUMBER:: 15932522
SPECIMEN QUALITY:: ADEQUATE

## 2023-11-16 MED ORDER — CEPHALEXIN 500 MG PO CAPS: 500.0000 mg | ORAL_CAPSULE | Freq: Two times a day (BID) | ORAL | 0 refills | Status: DC

## 2023-11-16 NOTE — Addendum Note (Signed)
 Addended by: Gwenevere Abbot on: 11/16/2023 11:18 AM   Modules accepted: Orders

## 2023-11-20 ENCOUNTER — Telehealth: Payer: Self-pay

## 2023-11-20 LAB — VITAMIN B1: Vitamin B1 (Thiamine): 8 nmol/L (ref 8–30)

## 2023-11-20 NOTE — Telephone Encounter (Signed)
 Auth Submission: NO AUTH NEEDED Site of care: Site of care: [facility] Payer: Medicare A/B plus BCBS supplement Medication & CPT/J Code(s) submitted: Leqvio  (Inclisiran) J1306 Auth type: Buy/Bill HB Units/visits requested: 284mg  x 2 doses Approval from: 06/29/23 to 12/06/24   Medicare A/B will cover 80%, BCBS supplement will cover the remaining 20%

## 2023-11-27 ENCOUNTER — Encounter: Payer: Self-pay | Admitting: Medical

## 2023-11-27 ENCOUNTER — Ambulatory Visit (INDEPENDENT_AMBULATORY_CARE_PROVIDER_SITE_OTHER): Payer: Medicare Other | Admitting: Medical

## 2023-11-27 VITALS — BP 138/60 | HR 66 | Temp 98.2°F | Resp 18 | Ht 61.0 in | Wt 111.0 lb

## 2023-11-27 DIAGNOSIS — N390 Urinary tract infection, site not specified: Secondary | ICD-10-CM | POA: Diagnosis not present

## 2023-11-27 DIAGNOSIS — M791 Myalgia, unspecified site: Secondary | ICD-10-CM

## 2023-11-27 DIAGNOSIS — R5383 Other fatigue: Secondary | ICD-10-CM | POA: Diagnosis not present

## 2023-11-27 DIAGNOSIS — M2550 Pain in unspecified joint: Secondary | ICD-10-CM

## 2023-11-27 LAB — CBC WITH DIFFERENTIAL/PLATELET
Basophils Absolute: 0.1 10*3/uL (ref 0.0–0.1)
Basophils Relative: 1.2 % (ref 0.0–3.0)
Eosinophils Absolute: 0.2 10*3/uL (ref 0.0–0.7)
Eosinophils Relative: 3.5 % (ref 0.0–5.0)
HCT: 34.8 % — ABNORMAL LOW (ref 36.0–46.0)
Hemoglobin: 11.3 g/dL — ABNORMAL LOW (ref 12.0–15.0)
Lymphocytes Relative: 12 % (ref 12.0–46.0)
Lymphs Abs: 0.6 10*3/uL — ABNORMAL LOW (ref 0.7–4.0)
MCHC: 32.6 g/dL (ref 30.0–36.0)
MCV: 85.4 fL (ref 78.0–100.0)
Monocytes Absolute: 0.2 10*3/uL (ref 0.1–1.0)
Monocytes Relative: 4.4 % (ref 3.0–12.0)
Neutro Abs: 3.8 10*3/uL (ref 1.4–7.7)
Neutrophils Relative %: 78.9 % — ABNORMAL HIGH (ref 43.0–77.0)
Platelets: 351 10*3/uL (ref 150.0–400.0)
RBC: 4.07 Mil/uL (ref 3.87–5.11)
RDW: 15.6 % — ABNORMAL HIGH (ref 11.5–15.5)
WBC: 4.9 10*3/uL (ref 4.0–10.5)

## 2023-11-27 LAB — C-REACTIVE PROTEIN: CRP: 1.7 mg/dL (ref 0.5–20.0)

## 2023-11-27 LAB — SEDIMENTATION RATE: Sed Rate: 45 mm/h — ABNORMAL HIGH (ref 0–30)

## 2023-11-27 MED ORDER — CEFTRIAXONE SODIUM 1 G IJ SOLR
1.0000 g | Freq: Once | INTRAMUSCULAR | Status: AC
  Administered 2023-11-27: 1 g via INTRAMUSCULAR

## 2023-11-27 NOTE — Patient Instructions (Signed)
Urinary Tract Infection Persistent fatigue and previous culture positive for UTI. Patient stopped initial antibiotic (Keflex) due to gastrointestinal upset and fear of C. diff infection. -Administer Rocephin 1g injection today to bypass gastrointestinal system. -Schedule follow-up appointment on 11/30/2023 to assess response to treatment and potentially administer second Rocephin injection. -Advise patient to take over-the-counter probiotics and consume probiotic-rich foods to reduce risk of C. diff infection. -Plan to repeat urine culture in the near future to assess bacterial count.  Chronic Fatigue Persistent despite extensive workup. No clear etiology identified. Possible multifactorial causes including anemia and UTI. -Continue to monitor and manage symptoms. -Recheck CBC today to ensure stable blood volume.  Lupus(hx of lupus) maybe factor in fatigue. Chronic condition with associated generalized body aches. No acute exacerbation noted. -dx in [place] years ago. Not seen or treated for in [place]. - will get inflammatory panel. -Consider referral back to rheumatologist.     Follow up friday at 1:20 or sooner if needed
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
## 2023-11-30 ENCOUNTER — Ambulatory Visit (INDEPENDENT_AMBULATORY_CARE_PROVIDER_SITE_OTHER): Payer: Medicare Other | Admitting: Medical

## 2023-11-30 VITALS — BP 156/52 | HR 82 | Temp 98.0°F | Resp 18 | Ht 61.0 in | Wt 111.8 lb

## 2023-11-30 DIAGNOSIS — R768 Other specified abnormal immunological findings in serum: Secondary | ICD-10-CM

## 2023-11-30 DIAGNOSIS — R5383 Other fatigue: Secondary | ICD-10-CM

## 2023-11-30 DIAGNOSIS — N390 Urinary tract infection, site not specified: Secondary | ICD-10-CM | POA: Diagnosis not present

## 2023-11-30 MED ORDER — CEFTRIAXONE SODIUM 1 G IJ SOLR
1.0000 g | Freq: Once | INTRAMUSCULAR | Status: AC
  Administered 2023-11-30: 1 g via INTRAMUSCULAR

## 2023-11-30 NOTE — Progress Notes (Signed)
   Subjective:    Patient ID: Jasmine Keith, female    DOB: September 04, 1951, 73 y.o.   MRN: [id number]  HPI  Discussed the use of AI scribe software for clinical note transcription with the patient, who gave verbal consent to proceed.  History of Present Illness   The patient, with a history of lupus and fibromyalgia, presented for follow-up after a recent urinary tract infection (UTI). She was scheduled to receive a second dose of Rocephin (1 gram) and had been advised to take Keflex twice daily, which she had not started at the time of the consultation. She reported no current UTI symptoms such as frequent urination, burning, fevers, chills, or back pain. However, she expressed concerns about taking Keflex due to a history of C. diff and potential stomach upset.  The patient also reported ongoing fatigue, which had not improved. . Lab results showed an elevated sed rate, and an ANA test had been ordered but results were not yet available. Other lab results, including cbc, cmp, B12, B1, thyroid, and vitamin D levels, did not indicate an obvious cause for the fatigue.       Review of Systems No uti signs/symptoms.    Objective:   Physical Exam   General Mental Status- Alert. General Appearance- Not in acute distress.    Skin General: Color- Normal Color. Moisture- Normal Moisture.   Neck Carotid Arteries- Normal color. Moisture- Normal Moisture. No carotid bruits. No JVD.   Chest and Lung Exam Auscultation: Breath Sounds:-CTA   Cardiovascular Auscultation:Rythm- RRR Murmurs & Other Heart Sounds:Auscultation of the heart reveals- No Murmurs.   Abdomen Inspection:-Inspeection Normal. Palpation/Percussion:Note:No mass. Palpation and Percussion of the abdomen reveal- faint supapubic Tender, Non Distended + BS, no rebound or guarding.     Neurologic Cranial Nerve exam:- CN III-XII intact(No nystagmus), symmetric smile. Strength:- 5/5 equal and symmetric strength both upper and  lower extremities.    Back- no cva pain     Assessment & Plan:   Patient Instructions  Urinary Tract Infection Patient did not take prescribed Keflex. Received one dose of [place] and due for second dose today. No current symptoms of UTI. -Administer Rocephin 1g injection today. -Advise patient to take Keflex twice daily until tuesday. Discussed your concern for c dif. Not ideal dosing but working with you based on your concern and c dif hx. -Order urine culture for Tuesday, 12/04/2023.  Chronic Fatigue Possible association with history of lupus. ANA test results pending. Other causes of fatigue ( anemia, metabolic abnormality, B12, B1, thyroid, vitamin D) have been ruled out. -If ANA test positive, refer to rheumatologist. -Continue over-the-counter Vitamin D supplementation at 4000 international units daily.  History of C. diff Patient has been advised to follow a probiotic-rich diet and take over-the-counter probiotics. -Continue probiotic-rich diet and over-the-counter probiotics.  Follow up date to be determined after culture results back and after ana results back.   Esperanza Richters, PA-C

## 2023-11-30 NOTE — Patient Instructions (Addendum)
Urinary Tract Infection Patient did not take prescribed Keflex. Received one dose of Rosadan and due for second dose today. No current symptoms of UTI. -Administer Rocephin 1g injection today. -Advise patient to take Keflex twice daily until tuesday. Discussed your concern for c dif. Not ideal dosing but working with you based on your concern and c dif hx. -Order urine culture for Tuesday, 12/04/2023.  Chronic Fatigue Possible association with history of lupus. ANA test results pending. Other causes of fatigue ( anemia, metabolic abnormality, B12, B1, thyroid, vitamin D) have been ruled out. -If ANA test positive, refer to rheumatologist. -Continue over-the-counter Vitamin D supplementation at 4000 international units daily.  History of C. diff Patient has been advised to follow a probiotic-rich diet and take over-the-counter probiotics. -Continue probiotic-rich diet and over-the-counter probiotics.  On review you do have low gfr. I think this is important to follow thru with nephrologist referral. Please call to schedule. Put in referral back in November.  Follow up date to be determined after culture results back and after ana results back.

## 2023-12-01 LAB — ANTI-NUCLEAR AB-TITER (ANA TITER): ANA Titer 1: 1:1280 {titer} — ABNORMAL HIGH

## 2023-12-01 LAB — RHEUMATOID FACTOR: Rheumatoid fact SerPl-aCnc: 10 [IU]/mL (ref <14)

## 2023-12-01 LAB — ANA: Anti Nuclear Antibody (ANA): POSITIVE — AB

## 2023-12-02 NOTE — Addendum Note (Signed)
Addended by: Gwenevere Abbot on: 12/02/2023 06:23 PM   Modules accepted: Orders

## 2023-12-04 ENCOUNTER — Other Ambulatory Visit: Payer: Medicare Other

## 2023-12-04 DIAGNOSIS — N390 Urinary tract infection, site not specified: Secondary | ICD-10-CM

## 2023-12-04 NOTE — Progress Notes (Signed)
Specimen provided

## 2023-12-05 LAB — URINE CULTURE
MICRO NUMBER:: 16007764
Result:: NO GROWTH
SPECIMEN QUALITY:: ADEQUATE

## 2023-12-06 ENCOUNTER — Telehealth: Payer: Self-pay

## 2023-12-06 NOTE — Telephone Encounter (Signed)
Patient called in about her urine culture results, patient notified per pcp, culture was negative for infection and to follow up with rheumatology.

## 2023-12-29 ENCOUNTER — Other Ambulatory Visit: Payer: Self-pay | Admitting: Medical

## 2023-12-29 DIAGNOSIS — R5383 Other fatigue: Secondary | ICD-10-CM

## 2024-01-19 ENCOUNTER — Other Ambulatory Visit: Payer: Self-pay | Admitting: Medical

## 2024-02-02 ENCOUNTER — Other Ambulatory Visit: Payer: Self-pay | Admitting: Medical

## 2024-02-04 ENCOUNTER — Ambulatory Visit (INDEPENDENT_AMBULATORY_CARE_PROVIDER_SITE_OTHER): Payer: Medicare Other | Admitting: *Deleted

## 2024-02-04 VITALS — BP 118/56 | HR 70 | Ht 61.0 in | Wt 114.8 lb

## 2024-02-04 DIAGNOSIS — Z1231 Encounter for screening mammogram for malignant neoplasm of breast: Secondary | ICD-10-CM | POA: Diagnosis not present

## 2024-02-04 DIAGNOSIS — Z780 Asymptomatic menopausal state: Secondary | ICD-10-CM

## 2024-02-04 DIAGNOSIS — Z87891 Personal history of nicotine dependence: Secondary | ICD-10-CM

## 2024-02-04 DIAGNOSIS — Z0000 Encounter for general adult medical examination without abnormal findings: Secondary | ICD-10-CM | POA: Diagnosis not present
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
## 2024-02-04 NOTE — Patient Instructions (Signed)
 [name] , Thank you for taking time to come for your Medicare Wellness Visit. I appreciate your ongoing commitment to your health goals. Please review the following plan we discussed and let me know if I can assist you in the future.    This is a list of the screening recommended for you and due dates:  Health Maintenance  Topic Date Due   Pneumonia Vaccine (1 of 2 - PCV) Never done   Complete foot exam   Never done   Eye exam for diabetics  Never done   Yearly kidney health urinalysis for diabetes  Never done   Hepatitis C Screening  Never done   DTaP/Tdap/Td vaccine (1 - Tdap) Never done   Screening for Lung Cancer  Never done   Mammogram  Never done   Zoster (Shingles) Vaccine (1 of 2) Never done   COVID-19 Vaccine (4 - 2024-25 season) 07/08/2023   Hemoglobin A1C  05/13/2024   Yearly kidney function blood test for diabetes  11/12/2024   Medicare Annual Wellness Visit  02/03/2025   Colon Cancer Screening  03/18/2028   Flu Shot  Completed   DEXA scan (bone density measurement)  Completed   HPV Vaccine  Aged Out    Next appointment: Follow up in one year for your annual wellness visit    Preventive Care 65 Years and Older, Female Preventive care refers to lifestyle choices and visits with your health care provider that can promote health and wellness. What does preventive care include? A yearly physical exam. This is also called an annual well check. Dental exams once or twice a year. Routine eye exams. Ask your health care provider how often you should have your eyes checked. Personal lifestyle choices, including: Daily care of your teeth and gums. Regular physical activity. Eating a healthy diet. Avoiding tobacco and drug use. Limiting alcohol use. Practicing safe sex. Taking low-dose aspirin every day. Taking vitamin and mineral supplements as recommended by your health care provider. What happens during an annual well check? The services and screenings done by  your health care provider during your annual well check will depend on your age, overall health, lifestyle risk factors, and family history of disease. Counseling  Your health care provider may ask you questions about your: Alcohol use. Tobacco use. Drug use. Emotional well-being. Home and relationship well-being. Sexual activity. Eating habits. History of falls. Memory and ability to understand (cognition). Work and work Astronomer. Reproductive health. Screening  You may have the following tests or measurements: Height, weight, and BMI. Blood pressure. Lipid and cholesterol levels. These may be checked every 5 years, or more frequently if you are over 58 years old. Skin check. Lung cancer screening. You may have this screening every year starting at age 87 if you have a 30-pack-year history of smoking and currently smoke or have quit within the past 15 years. Fecal occult blood test (FOBT) of the stool. You may have this test every year starting at age 66. Flexible sigmoidoscopy or colonoscopy. You may have a sigmoidoscopy every 5 years or a colonoscopy every 10 years starting at age 25. Hepatitis C blood test. Hepatitis B blood test. Sexually transmitted disease (STD) testing. Diabetes screening. This is done by checking your blood sugar (glucose) after you have not eaten for a while (fasting). You may have this done every 1-3 years. Bone density scan. This is done to screen for osteoporosis. You may have this done starting at age 90. Mammogram. This may be done  every 1-2 years. Talk to your health care provider about how often you should have regular mammograms. Talk with your health care provider about your test results, treatment options, and if necessary, the need for more tests. Vaccines  Your health care provider may recommend certain vaccines, such as: Influenza vaccine. This is recommended every year. Tetanus, diphtheria, and acellular pertussis (Tdap, Td) vaccine. You  may need a Td booster every 10 years. Zoster vaccine. You may need this after age 41. Pneumococcal 13-valent conjugate (PCV13) vaccine. One dose is recommended after age 67. Pneumococcal polysaccharide (PPSV23) vaccine. One dose is recommended after age 74. Talk to your health care provider about which screenings and vaccines you need and how often you need them. This information is not intended to replace advice given to you by your health care provider. Make sure you discuss any questions you have with your health care provider. Document Released: 11/19/2015 Document Revised: 07/12/2016 Document Reviewed: 08/24/2015 Elsevier Interactive Patient Education  2017 [employer].  Fall Prevention in the Home Falls can cause injuries. They can happen to people of all ages. There are many things you can do to make your home safe and to help prevent falls. What can I do on the outside of my home? Regularly fix the edges of walkways and driveways and fix any cracks. Remove anything that might make you trip as you walk through a door, such as a raised step or threshold. Trim any bushes or trees on the path to your home. Use bright outdoor lighting. Clear any walking paths of anything that might make someone trip, such as rocks or tools. Regularly check to see if handrails are loose or broken. Make sure that both sides of any steps have handrails. Any raised decks and porches should have guardrails on the edges. Have any leaves, snow, or ice cleared regularly. Use sand or salt on walking paths during winter. Clean up any spills in your garage right away. This includes oil or grease spills. What can I do in the bathroom? Use night lights. Install grab bars by the toilet and in the tub and shower. Do not use towel bars as grab bars. Use non-skid mats or decals in the tub or shower. If you need to sit down in the shower, use a plastic, non-slip stool. Keep the floor dry. Clean up any water that spills  on the floor as soon as it happens. Remove soap buildup in the tub or shower regularly. Attach bath mats securely with double-sided non-slip rug tape. Do not have throw rugs and other things on the floor that can make you trip. What can I do in the bedroom? Use night lights. Make sure that you have a light by your bed that is easy to reach. Do not use any sheets or blankets that are too big for your bed. They should not hang down onto the floor. Have a firm chair that has side arms. You can use this for support while you get dressed. Do not have throw rugs and other things on the floor that can make you trip. What can I do in the kitchen? Clean up any spills right away. Avoid walking on wet floors. Keep items that you use a lot in easy-to-reach places. If you need to reach something above you, use a strong step stool that has a grab bar. Keep electrical cords out of the way. Do not use floor polish or wax that makes floors slippery. If you must use wax, use  non-skid floor wax. Do not have throw rugs and other things on the floor that can make you trip. What can I do with my stairs? Do not leave any items on the stairs. Make sure that there are handrails on both sides of the stairs and use them. Fix handrails that are broken or loose. Make sure that handrails are as long as the stairways. Check any carpeting to make sure that it is firmly attached to the stairs. Fix any carpet that is loose or worn. Avoid having throw rugs at the top or bottom of the stairs. If you do have throw rugs, attach them to the floor with carpet tape. Make sure that you have a light switch at the top of the stairs and the bottom of the stairs. If you do not have them, ask someone to add them for you. What else can I do to help prevent falls? Wear shoes that: Do not have high heels. Have rubber bottoms. Are comfortable and fit you well. Are closed at the toe. Do not wear sandals. If you use a stepladder: Make  sure that it is fully opened. Do not climb a closed stepladder. Make sure that both sides of the stepladder are locked into place. Ask someone to hold it for you, if possible. Clearly mark and make sure that you can see: Any grab bars or handrails. First and last steps. Where the edge of each step is. Use tools that help you move around (mobility aids) if they are needed. These include: Canes. Walkers. Scooters. Crutches. Turn on the lights when you go into a dark area. Replace any light bulbs as soon as they burn out. Set up your furniture so you have a clear path. Avoid moving your furniture around. If any of your floors are uneven, fix them. If there are any pets around you, be aware of where they are. Review your medicines with your doctor. Some medicines can make you feel dizzy. This can increase your chance of falling. Ask your doctor what other things that you can do to help prevent falls. This information is not intended to replace advice given to you by your health care provider. Make sure you discuss any questions you have with your health care provider. Document Released: 08/19/2009 Document Revised: 03/30/2016 Document Reviewed: 11/27/2014 Elsevier Interactive Patient Education  2017 [employer].

## 2024-02-05 ENCOUNTER — Other Ambulatory Visit: Payer: Self-pay | Admitting: Medical

## 2024-02-05 ENCOUNTER — Ambulatory Visit (INDEPENDENT_AMBULATORY_CARE_PROVIDER_SITE_OTHER): Admitting: Medical

## 2024-02-05 VITALS — BP 124/64 | HR 71 | Resp 18 | Ht 61.0 in | Wt 115.2 lb

## 2024-02-05 DIAGNOSIS — E538 Deficiency of other specified B group vitamins: Secondary | ICD-10-CM | POA: Diagnosis not present

## 2024-02-05 DIAGNOSIS — R930 Abnormal findings on diagnostic imaging of skull and head, not elsewhere classified: Secondary | ICD-10-CM

## 2024-02-05 DIAGNOSIS — R42 Dizziness and giddiness: Secondary | ICD-10-CM

## 2024-02-05 DIAGNOSIS — R944 Abnormal results of kidney function studies: Secondary | ICD-10-CM | POA: Diagnosis not present

## 2024-02-05 DIAGNOSIS — R5383 Other fatigue: Secondary | ICD-10-CM | POA: Diagnosis not present

## 2024-02-05 DIAGNOSIS — N6459 Other signs and symptoms in breast: Secondary | ICD-10-CM | POA: Diagnosis not present

## 2024-02-05 DIAGNOSIS — N63 Unspecified lump in unspecified breast: Secondary | ICD-10-CM

## 2024-02-05 DIAGNOSIS — R270 Ataxia, unspecified: Secondary | ICD-10-CM

## 2024-02-05 DIAGNOSIS — D649 Anemia, unspecified: Secondary | ICD-10-CM | POA: Diagnosis not present

## 2024-02-05 LAB — CBC WITH DIFFERENTIAL/PLATELET
Basophils Absolute: 0.1 10*3/uL (ref 0.0–0.1)
Basophils Relative: 1.3 % (ref 0.0–3.0)
Eosinophils Absolute: 0.1 10*3/uL (ref 0.0–0.7)
Eosinophils Relative: 1.8 % (ref 0.0–5.0)
HCT: 34 % — ABNORMAL LOW (ref 36.0–46.0)
Hemoglobin: 11.5 g/dL — ABNORMAL LOW (ref 12.0–15.0)
Lymphocytes Relative: 13.4 % (ref 12.0–46.0)
Lymphs Abs: 0.6 10*3/uL — ABNORMAL LOW (ref 0.7–4.0)
MCHC: 33.8 g/dL (ref 30.0–36.0)
MCV: 88.8 fl (ref 78.0–100.0)
Monocytes Absolute: 0.2 10*3/uL (ref 0.1–1.0)
Monocytes Relative: 3.5 % (ref 3.0–12.0)
Neutro Abs: 3.4 10*3/uL (ref 1.4–7.7)
Neutrophils Relative %: 80 % — ABNORMAL HIGH (ref 43.0–77.0)
Platelets: 274 10*3/uL (ref 150.0–400.0)
RBC: 3.83 Mil/uL — ABNORMAL LOW (ref 3.87–5.11)
RDW: 13.1 % (ref 11.5–15.5)
WBC: 4.2 10*3/uL (ref 4.0–10.5)

## 2024-02-05 LAB — COMPREHENSIVE METABOLIC PANEL WITH GFR
ALT: 12 U/L (ref 0–35)
AST: 20 U/L (ref 0–37)
Albumin: 4.3 g/dL (ref 3.5–5.2)
Alkaline Phosphatase: 46 U/L (ref 39–117)
BUN: 33 mg/dL — ABNORMAL HIGH (ref 6–23)
CO2: 25 meq/L (ref 19–32)
Calcium: 9.8 mg/dL (ref 8.4–10.5)
Chloride: 99 meq/L (ref 96–112)
Creatinine, Ser: 1.91 mg/dL — ABNORMAL HIGH (ref 0.40–1.20)
GFR: 26.12 mL/min — ABNORMAL LOW (ref >60.00)
Glucose, Bld: 397 mg/dL — ABNORMAL HIGH (ref 70–99)
Potassium: 4.4 meq/L (ref 3.5–5.1)
Sodium: 135 meq/L (ref 135–145)
Total Bilirubin: 0.5 mg/dL (ref 0.2–1.2)
Total Protein: 7.5 g/dL (ref 6.0–8.3)

## 2024-02-05 LAB — VITAMIN B12: Vitamin B-12: 522 pg/mL (ref 211–911)

## 2024-02-05 NOTE — Patient Instructions (Signed)
 Breast mass/lump A palpable mass is present on the lateral aspect of the left breast, near the junction of the breast tissue. The mass has increased in size since it was first noticed after her bypass surgery. The differential diagnosis includes a lipoma, but further evaluation is necessary to rule out other possibilities. - Order diagnostic mammogram - Instruct her to contact the office if not contacted by [facility] imaging within a week  Dizziness Experiences dizziness primarily upon changing positions, such as sitting to standing or lying down to standing. The dizziness is transient, lasting 5-10 seconds, and is not associated with vertigo, headaches, vomiting, slurred speech, or weakness. The dizziness may be related to postural changes or possibly blood sugar fluctuations. - Advise to monitor blood sugar levels if dizziness persists beyond 10 seconds or occurs without positional changes -gain balance before ambulating. -check labs today to identify contributing factors such as anemia, low iron or electrolytes abnormality. -if ever dizzy and motor or sensory deficits or ha then be seen in ED.  Type 2 Diabetes Mellitus Diabetes is suboptimal controlled with a recent A1c of 9.2%, indicating an average blood glucose of approximately 216 mg/dL. Currently on 30 units of Lantus daily, with fasting blood sugars ranging from 120 to 130 mg/dL, occasionally higher. Dietary habits are poor, contributing to elevated blood sugar levels. Advised to increase Lantus dosing by 1 unit each day if fasting morning sugar is above 110 mg/dL until it reaches 621 mg/dL. - Continue Lantus 30 units daily - Plan to recheck A1c in 10 days. Make adjustment to treatment regimen based on lab.  Chronic Kidney Disease GFR is 31 and creatinine level is 1.74, indicating chronic kidney disease. Has not completed the necessary paperwork for a nephrology referral, which is delaying further evaluation and management. The decreased  GFR could contribute to fatigue. - Encourage to complete and submit nephrology referral paperwork - Check metabolic panel to assess current kidney function  Anemia Anemia may contribute to her fatigue. The exact etiology of the anemia is not clear. B12 levels were previously low but improved with supplementation. - Check iron levels and cbc  Low b12 and b1 in past - Check B12 and B1 (thiamine) levels  Cognitive decline Exhibits signs of memory impairment, with a mini-mental status score of 26. Memory issues have worsened over the past few months, affecting short-term memory and daily functioning. Currently not driving. - Monitor cognitive function and consider referral to neurology if memory issues worsen  General Health Maintenance Overdue for routine breast cancer screening, has not had a mammogram in over five years. - Order diagnostic mammogram as part of breast mass evaluation  Follow-up Requires follow-up for multiple health issues, including diabetes management, kidney function, and cognitive assessment. - Schedule follow-up appointment after lab results are available - Plan to recheck A1c in 10 days
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
## 2024-02-08 ENCOUNTER — Emergency Department (HOSPITAL_BASED_OUTPATIENT_CLINIC_OR_DEPARTMENT_OTHER)

## 2024-02-08 ENCOUNTER — Emergency Department (HOSPITAL_BASED_OUTPATIENT_CLINIC_OR_DEPARTMENT_OTHER)
Admission: EM | Admit: 2024-02-08 | Discharge: 2024-02-08 | Disposition: A | Discharge status: Home / Self Care | Attending: Emergency Medicine | Admitting: Emergency Medicine

## 2024-02-08 ENCOUNTER — Ambulatory Visit (INDEPENDENT_AMBULATORY_CARE_PROVIDER_SITE_OTHER): Admitting: Internal Medicine

## 2024-02-08 ENCOUNTER — Ambulatory Visit: Payer: Self-pay

## 2024-02-08 ENCOUNTER — Encounter: Payer: Self-pay | Admitting: Internal Medicine

## 2024-02-08 ENCOUNTER — Encounter (HOSPITAL_BASED_OUTPATIENT_CLINIC_OR_DEPARTMENT_OTHER): Payer: Self-pay | Admitting: Urology

## 2024-02-08 VITALS — BP 126/56 | HR 66 | Temp 98.1°F | Resp 16 | Ht 61.0 in | Wt 115.2 lb

## 2024-02-08 DIAGNOSIS — E1100 Type 2 diabetes mellitus with hyperosmolarity without nonketotic hyperglycemic-hyperosmolar coma (NKHHC): Secondary | ICD-10-CM

## 2024-02-08 DIAGNOSIS — Z794 Long term (current) use of insulin: Secondary | ICD-10-CM | POA: Insufficient documentation

## 2024-02-08 DIAGNOSIS — Z79899 Other long term (current) drug therapy: Secondary | ICD-10-CM | POA: Insufficient documentation

## 2024-02-08 DIAGNOSIS — R930 Abnormal findings on diagnostic imaging of skull and head, not elsewhere classified: Secondary | ICD-10-CM | POA: Diagnosis not present

## 2024-02-08 DIAGNOSIS — R9402 Abnormal brain scan: Secondary | ICD-10-CM | POA: Insufficient documentation

## 2024-02-08 DIAGNOSIS — E1165 Type 2 diabetes mellitus with hyperglycemia: Secondary | ICD-10-CM | POA: Diagnosis present

## 2024-02-08 DIAGNOSIS — R270 Ataxia, unspecified: Secondary | ICD-10-CM | POA: Diagnosis not present

## 2024-02-08 DIAGNOSIS — R739 Hyperglycemia, unspecified: Secondary | ICD-10-CM

## 2024-02-08 LAB — CBC WITH DIFFERENTIAL/PLATELET
Abs Immature Granulocytes: 0.03 10*3/uL (ref 0.00–0.07)
Basophils Absolute: 0 10*3/uL (ref 0.0–0.1)
Basophils Relative: 1 %
Eosinophils Absolute: 0.1 10*3/uL (ref 0.0–0.5)
Eosinophils Relative: 3 %
HCT: 29.4 % — ABNORMAL LOW (ref 36.0–46.0)
Hemoglobin: 10 g/dL — ABNORMAL LOW (ref 12.0–15.0)
Immature Granulocytes: 1 %
Lymphocytes Relative: 14 %
Lymphs Abs: 0.5 10*3/uL — ABNORMAL LOW (ref 0.7–4.0)
MCH: 29.8 pg (ref 26.0–34.0)
MCHC: 34 g/dL (ref 30.0–36.0)
MCV: 87.5 fL (ref 80.0–100.0)
Monocytes Absolute: 0.2 10*3/uL (ref 0.1–1.0)
Monocytes Relative: 5 %
Neutro Abs: 2.8 10*3/uL (ref 1.7–7.7)
Neutrophils Relative %: 76 %
Platelets: 252 10*3/uL (ref 150–400)
RBC: 3.36 MIL/uL — ABNORMAL LOW (ref 3.87–5.11)
RDW: 12.5 % (ref 11.5–15.5)
WBC: 3.6 10*3/uL — ABNORMAL LOW (ref 4.0–10.5)
nRBC: 0 % (ref 0.0–0.2)

## 2024-02-08 LAB — URINALYSIS, MICROSCOPIC (REFLEX)

## 2024-02-08 LAB — COMPREHENSIVE METABOLIC PANEL WITH GFR
ALT: 11 U/L (ref 0–44)
AST: 21 U/L (ref 15–41)
Albumin: 3.6 g/dL (ref 3.5–5.0)
Alkaline Phosphatase: 38 U/L (ref 38–126)
Anion gap: 9 (ref 5–15)
BUN: 35 mg/dL — ABNORMAL HIGH (ref 8–23)
CO2: 24 mmol/L (ref 22–32)
Calcium: 9 mg/dL (ref 8.9–10.3)
Chloride: 101 mmol/L (ref 98–111)
Creatinine, Ser: 1.73 mg/dL — ABNORMAL HIGH (ref 0.44–1.00)
GFR, Estimated: 31 mL/min — ABNORMAL LOW (ref >60)
Glucose, Bld: 314 mg/dL — ABNORMAL HIGH (ref 70–99)
Potassium: 4.2 mmol/L (ref 3.5–5.1)
Sodium: 134 mmol/L — ABNORMAL LOW (ref 135–145)
Total Bilirubin: 0.6 mg/dL (ref 0.0–1.2)
Total Protein: 7 g/dL (ref 6.5–8.1)

## 2024-02-08 LAB — I-STAT VENOUS BLOOD GAS, ED
Acid-base deficit: 1 mmol/L (ref 0.0–2.0)
Bicarbonate: 24.3 mmol/L (ref 20.0–28.0)
Calcium, Ion: 1.27 mmol/L (ref 1.15–1.40)
HCT: 30 % — ABNORMAL LOW (ref 36.0–46.0)
Hemoglobin: 10.2 g/dL — ABNORMAL LOW (ref 12.0–15.0)
O2 Saturation: 70 %
Potassium: 4.2 mmol/L (ref 3.5–5.1)
Sodium: 138 mmol/L (ref 135–145)
TCO2: 26 mmol/L (ref 22–32)
pCO2, Ven: 44.2 mmHg (ref 44–60)
pH, Ven: 7.349 (ref 7.25–7.43)
pO2, Ven: 39 mmHg (ref 32–45)

## 2024-02-08 LAB — CBG MONITORING, ED: Glucose-Capillary: 217 mg/dL — ABNORMAL HIGH (ref 70–99)

## 2024-02-08 LAB — URINALYSIS, ROUTINE W REFLEX MICROSCOPIC
Bilirubin Urine: NEGATIVE
Glucose, UA: 250 mg/dL — AB
Hgb urine dipstick: NEGATIVE
Ketones, ur: NEGATIVE mg/dL
Nitrite: NEGATIVE
Protein, ur: 30 mg/dL — AB
Specific Gravity, Urine: 1.025 (ref 1.005–1.030)
pH: 7 (ref 5.0–8.0)

## 2024-02-08 LAB — LIPASE, BLOOD: Lipase: 49 U/L (ref 11–51)

## 2024-02-08 LAB — POCT GLUCOSE (DEVICE FOR HOME USE): POC Glucose: 288 mg/dL — AB (ref 70–99)

## 2024-02-08 LAB — MAGNESIUM: Magnesium: 1.9 mg/dL (ref 1.7–2.4)

## 2024-02-08 MED ORDER — SODIUM CHLORIDE 0.9 % IV BOLUS
500.0000 mL | Freq: Once | INTRAVENOUS | Status: AC
  Administered 2024-02-08: 500 mL via INTRAVENOUS

## 2024-02-08 NOTE — Discharge Instructions (Addendum)
 Follow your regular provider's instructions for better control of your blood sugar.   Follow up with him for scheduling an outpatient MRI brain as we discussed, for further evaluation of abnormal area seen on CT and reported as stable.

## 2024-02-08 NOTE — ED Notes (Signed)
 Pt has not been compliant with monitoring her CBG at home. Has been taking insulin as prescribed but does not know if her sugars are controlled by it. Has not been drinking water, diet has been uncontrolled.

## 2024-02-08 NOTE — ED Notes (Signed)

## 2024-02-08 NOTE — Patient Instructions (Addendum)
 Proceed to the emergency room downstairs

## 2024-02-08 NOTE — Telephone Encounter (Signed)
 Chief Complaint: elevated blood glucose Symptoms: elevated blood glucose Frequency: this morning Pertinent Negatives: Patient denies sob, chest pain Disposition: [] ED /[] Urgent Care (no appt availability in office) / [x] Appointment(In office/virtual)/ []  [facility]/ [] Home Care/ [] Refused Recommended Disposition /[]  Mobile Bus/ [x]  Follow-up with PCP Additional Notes: Pt states that she just took her blood glucose and it was 407.  States that she doesn't know what it usually ranges because she doesn't really check it. States that she was having some dizziness this morning which is why she took her glucose. States that she does take long acting insulin but nothing short acting. Requesting an appt today.    Copied from CRM [id number]. Topic: Clinical - Red Word Triage >> Feb 08, 2024 11:29 AM Fuller Mandril wrote: Red Word that prompted transfer to Nurse Triage: This morning blood sugar 407 Reason for Disposition  Blood glucose > 400 mg/dL (91.4 mmol/L)  Answer Assessment - Initial Assessment Questions 1. BLOOD GLUCOSE: "What is your blood glucose level?"      407 2. ONSET: "When did you check the blood glucose?"     About 5 minutes 3. USUAL RANGE: "What is your glucose level usually?" (e.g., usual fasting morning value, usual evening value)     Unknown only takes once a month 5. TYPE 1 or 2:  "Do you know what type of diabetes you have?"  (e.g., Type 1, Type 2, Gestational; doesn't know)      Type 2 6. INSULIN: "Do you take insulin?" "What type of insulin(s) do you use? What is the mode of delivery? (syringe, pen; injection or pump)?"      pen 7. DIABETES PILLS: "Do you take any pills for your diabetes?" If Yes, ask: "Have you missed taking any pills recently?"     Haven't missed any 8. OTHER SYMPTOMS: "Do you have any symptoms?" (e.g., fever, frequent urination, difficulty breathing, dizziness, weakness, vomiting)     Dizziness,  Protocols used: Diabetes - High Blood  Sugar-A-AH
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
## 2024-02-08 NOTE — ED Triage Notes (Signed)
 Provider sent due to hypergylcemia, BS > 400 this am and altered gait x 3 days  Denies any pain  States ate jelly donuts last night  States feels like walking to the side when she is walking and states feels weak as well  No recent falls or head injury

## 2024-02-08 NOTE — ED Provider Notes (Signed)
 [facility] Provider Note   CSN: [id number] Arrival date & time: 02/08/24  1411     History  Chief Complaint  Patient presents with   Hyperglycemia    Jasmine Keith is a 73 y.o. female.  Patient to ED from PCP office with concern for abnormal gait for the past 3 days. She is here with her husband. She describes symptoms of feeling lightheaded and off-balance when she walks. She has poor focus but no visual deficits. No headache, nausea, SOB, chest pain. Husband reports the sense of being off balance is a recurrent issue and has been coming and going for months. She is a diabetic, on Lantus 30 U at bedtime but husband states she had previously been on a sliding scale during the day that she stopped on her own. CBG today was reported as 488. She admits that she does not check her blood sugar regularly. No falls.   The history is provided by the patient and the spouse. No language interpreter was used.  Hyperglycemia      Home Medications Prior to Admission medications   Medication Sig Start Date End Date Taking? Authorizing Provider  acetaminophen (TYLENOL) 325 MG tablet Take 2 tablets (650 mg total) by mouth every 6 (six) hours as needed for mild pain (or Fever >/= 101). 05/29/22   Swayze, Ava, DO  alendronate (FOSAMAX) 70 MG tablet TAKE 1 TABLET BY MOUTH ONCE A WEEK. TAKE WITH A FULL GLASS OF WATER ON AN EMPTY STOMACH. Patient taking differently: Take 70 mg by mouth once a week. 06/14/23   Saguier, Ramon Dredge, PA-C  amLODipine (NORVASC) 5 MG tablet Take 5 mg by mouth daily.    [provider]  busPIRone (BUSPAR) 7.5 MG tablet Take 1 tablet (7.5 mg total) by mouth 2 (two) times daily. 11/08/23   Saguier, Ramon Dredge, PA-C  carvedilol (COREG) 6.25 MG tablet TAKE 1 TABLET BY MOUTH TWICE A DAY WITH FOOD 02/04/24   Saguier, Ramon Dredge, PA-C  cephALEXin (KEFLEX) 500 MG capsule Take 1 capsule (500 mg total) by mouth 2 (two) times daily. Patient not  taking: Reported on 02/08/2024 11/16/23   Saguier, Ramon Dredge, PA-C  Continuous Blood Gluc Receiver (FREESTYLE LIBRE 2 READER) DEVI USE AS DIRECTED Patient taking differently: 1 each by Other route See admin instructions. Glucose check 11/07/22   Saguier, Ramon Dredge, PA-C  Continuous Blood Gluc Sensor (FREESTYLE LIBRE 14 DAY SENSOR) MISC 1 kit by Does not apply route daily. 12/05/22   Saguier, Ramon Dredge, PA-C  cyclobenzaprine (FLEXERIL) 5 MG tablet Take 1 tablet (5 mg total) by mouth at bedtime. 07/31/23   Saguier, Ramon Dredge, PA-C  dicyclomine (BENTYL) 10 MG capsule TAKE 1 CAPSULE (10 MG TOTAL) BY MOUTH 4 TIMES A DAY BEFORE MEALS AND AT BEDTIME 02/04/24   Saguier, Ramon Dredge, PA-C  Evolocumab (REPATHA SURECLICK [place]) Inject 140 mg into the skin as directed.    [provider]  fenofibrate 160 MG tablet TAKE 1 TABLET BY MOUTH EVERY DAY 01/21/24   Saguier, Ramon Dredge, PA-C  fluticasone [facility]) 50 MCG/ACT nasal spray Place 2 sprays into both nostrils daily. 08/23/23   Saguier, Ramon Dredge, PA-C  gabapentin (NEURONTIN) 300 MG capsule TAKE 1 CAPSULE BY MOUTH TWICE A DAY 11/09/23   Bradd Canary, MD  hydrALAZINE (APRESOLINE) 100 MG tablet TAKE 1 TABLET BY MOUTH THREE TIMES A DAY 08/06/23   Saguier, Ramon Dredge, PA-C  hydrALAZINE (APRESOLINE) 25 MG tablet 3 tabs(75 mg ) tid Patient taking differently: Take 1 tablet by  mouth 3 (three) times daily. 3 tabs(75 mg ) tid 06/11/23   Saguier, Ramon Dredge, PA-C  HYDROcodone-acetaminophen (NORCO/VICODIN) 5-325 MG tablet Take 1 tablet by mouth every 8 (eight) hours as needed. Patient taking differently: Take 1 tablet by mouth every 8 (eight) hours as needed for moderate pain (pain score 4-6) or severe pain (pain score 7-10). 01/23/23   Myra Rude, MD  Insulin Glargine Solostar (LANTUS) 100 UNIT/ML Solostar Pen Inject 30 Units into the skin daily. 11/14/23   Saguier, Ramon Dredge, PA-C  Insulin Pen Needle (BD PEN NEEDLE NANO U/F) 32G X 4 MM MISC Use one needle daily to inject insulin Patient taking  differently: 1 each by Other route See admin instructions. Use one needle daily to inject insulin 11/16/22   Saguier, Ramon Dredge, PA-C  Insulin Pen Needle (PEN NEEDLES 3/16") 31G X 5 MM MISC Inject 8 Units into the skin daily. 09/07/23   Saguier, Ramon Dredge, PA-C  levothyroxine (SYNTHROID) 25 MCG tablet TAKE 1 TABLET BY MOUTH EVERY DAY BEFORE BREAKFAST 12/31/23   Saguier, Ramon Dredge, PA-C  Magnesium 250 MG TABS Take 4 tablets by mouth daily.    [provider]  meclizine (ANTIVERT) 25 MG tablet Take 1 tablet (25 mg total) by mouth 3 (three) times daily as needed for dizziness. Patient not taking: Reported on 02/08/2024 06/13/22   Miguel Rota, MD  pantoprazole (PROTONIX) 40 MG tablet Take 1 tablet (40 mg total) by mouth daily. 07/06/23   Saguier, Ramon Dredge, PA-C  valsartan (DIOVAN) 80 MG tablet Take 1 tablet (80 mg total) by mouth daily. 08/06/23   Revankar, Aundra Dubin, MD  venlafaxine XR (EFFEXOR-XR) 75 MG 24 hr capsule TAKE 1 CAPSULE BY MOUTH DAILY WITH BREAKFAST. 01/21/24   Saguier, Ramon Dredge, PA-C  Vitamin D, Ergocalciferol, (DRISDOL) 1.25 MG (50000 UNIT) CAPS capsule TAKE 1 CAPSULE BY MOUTH ONE TIME PER WEEK Patient not taking: Reported on 02/08/2024 11/20/22   Saguier, Ramon Dredge, PA-C      Allergies    Lipitor [atorvastatin], Lovastatin, and Rosuvastatin    Review of Systems   Review of Systems  Physical Exam Updated Vital Signs BP (!) 138/51 (BP Location: Right Arm)   Pulse 60   Temp 98.3 F (36.8 C) (Oral)   Resp 18   Ht 5\' 1"  (1.549 m)   Wt 52.2 kg   SpO2 97%   BMI 21.74 kg/m  Physical Exam Constitutional:      Appearance: She is well-developed.     Comments: Frail, chronically ill appearing.  HENT:     Head: Normocephalic.     Mouth/Throat:     Mouth: Mucous membranes are moist.  Eyes:     Extraocular Movements: Extraocular movements intact.     Pupils: Pupils are equal, round, and reactive to light.  Cardiovascular:     Rate and Rhythm: Normal rate and regular rhythm.     Heart sounds: No  murmur heard. Pulmonary:     Effort: Pulmonary effort is normal.     Breath sounds: Normal breath sounds. No wheezing, rhonchi or rales.  Abdominal:     Palpations: Abdomen is soft.     Tenderness: There is no abdominal tenderness. There is no guarding or rebound.  Musculoskeletal:        General: Normal range of motion.     Cervical back: Normal range of motion and neck supple.     Right lower leg: No edema.     Left lower leg: No edema.  Skin:    General: Skin is  warm and dry.  Neurological:     General: No focal deficit present.     Mental Status: She is alert and oriented to person, place, and time.     GCS: GCS eye subscore is 4. GCS verbal subscore is 5. GCS motor subscore is 6.     Cranial Nerves: No dysarthria or facial asymmetry.     Sensory: Sensation is intact.     Motor: No weakness, abnormal muscle tone or pronator drift.     Coordination: Finger-Nose-Finger Test normal.     Comments: Gait is slow and guarded, but no ataxia.      ED Results / Procedures / Treatments   Labs (all labs ordered are listed, but only abnormal results are displayed) Labs Reviewed  COMPREHENSIVE METABOLIC PANEL WITH GFR - Abnormal; Notable for the following components:      Result Value   Sodium 134 (*)    Glucose, Bld 314 (*)    BUN 35 (*)    Creatinine, Ser 1.73 (*)    GFR, Estimated 31 (*)    All other components within normal limits  CBC WITH DIFFERENTIAL/PLATELET - Abnormal; Notable for the following components:   WBC 3.6 (*)    RBC 3.36 (*)    Hemoglobin 10.0 (*)    HCT 29.4 (*)    Lymphs Abs 0.5 (*)    All other components within normal limits  URINALYSIS, ROUTINE W REFLEX MICROSCOPIC - Abnormal; Notable for the following components:   Glucose, UA 250 (*)    Protein, ur 30 (*)    Leukocytes,Ua TRACE (*)    All other components within normal limits  URINALYSIS, MICROSCOPIC (REFLEX) - Abnormal; Notable for the following components:   Bacteria, UA RARE (*)    All other  components within normal limits  I-STAT VENOUS BLOOD GAS, ED - Abnormal; Notable for the following components:   HCT 30.0 (*)    Hemoglobin 10.2 (*)    All other components within normal limits  CBG MONITORING, ED - Abnormal; Notable for the following components:   Glucose-Capillary 217 (*)    All other components within normal limits  LIPASE, BLOOD  MAGNESIUM   Results for orders placed or performed during the hospital encounter of 02/08/24  Comprehensive metabolic panel   Collection Time: 02/08/24  3:02 PM  Result Value Ref Range   Sodium 134 (L) 135 - 145 mmol/L   Potassium 4.2 3.5 - 5.1 mmol/L   Chloride 101 98 - 111 mmol/L   CO2 24 22 - 32 mmol/L   Glucose, Bld 314 (H) 70 - 99 mg/dL   BUN 35 (H) 8 - 23 mg/dL   Creatinine, Ser 1.61 (H) 0.44 - 1.00 mg/dL   Calcium 9.0 8.9 - 09.6 mg/dL   Total Protein 7.0 6.5 - 8.1 g/dL   Albumin 3.6 3.5 - 5.0 g/dL   AST 21 15 - 41 U/L   ALT 11 0 - 44 U/L   Alkaline Phosphatase 38 38 - 126 U/L   Total Bilirubin 0.6 0.0 - 1.2 mg/dL   GFR, Estimated 31 (L) >60 mL/min   Anion gap 9 5 - 15  Lipase, blood   Collection Time: 02/08/24  3:02 PM  Result Value Ref Range   Lipase 49 11 - 51 U/L  CBC with Differential   Collection Time: 02/08/24  3:02 PM  Result Value Ref Range   WBC 3.6 (L) 4.0 - 10.5 K/uL   RBC 3.36 (L) 3.87 - 5.11 MIL/uL  Hemoglobin 10.0 (L) 12.0 - 15.0 g/dL   HCT 29.5 (L) 28.4 - 13.2 %   MCV 87.5 80.0 - 100.0 fL   MCH 29.8 26.0 - 34.0 pg   MCHC 34.0 30.0 - 36.0 g/dL   RDW 44.0 10.2 - 72.5 %   Platelets 252 150 - 400 K/uL   nRBC 0.0 0.0 - 0.2 %   Neutrophils Relative % 76 %   Neutro Abs 2.8 1.7 - 7.7 K/uL   Lymphocytes Relative 14 %   Lymphs Abs 0.5 (L) 0.7 - 4.0 K/uL   Monocytes Relative 5 %   Monocytes Absolute 0.2 0.1 - 1.0 K/uL   Eosinophils Relative 3 %   Eosinophils Absolute 0.1 0.0 - 0.5 K/uL   Basophils Relative 1 %   Basophils Absolute 0.0 0.0 - 0.1 K/uL   Immature Granulocytes 1 %   Abs Immature  Granulocytes 0.03 0.00 - 0.07 K/uL  Magnesium   Collection Time: 02/08/24  3:02 PM  Result Value Ref Range   Magnesium 1.9 1.7 - 2.4 mg/dL  I-Stat venous blood gas, ([facility], [facility], [facility])   Collection Time: 02/08/24  3:14 PM  Result Value Ref Range   pH, Ven 7.349 7.25 - 7.43   pCO2, Ven 44.2 44 - 60 mmHg   pO2, Ven 39 32 - 45 mmHg   Bicarbonate 24.3 20.0 - 28.0 mmol/L   TCO2 26 22 - 32 mmol/L   O2 Saturation 70 %   Acid-base deficit 1.0 0.0 - 2.0 mmol/L   Sodium 138 135 - 145 mmol/L   Potassium 4.2 3.5 - 5.1 mmol/L   Calcium, Ion 1.27 1.15 - 1.40 mmol/L   HCT 30.0 (L) 36.0 - 46.0 %   Hemoglobin 10.2 (L) 12.0 - 15.0 g/dL   Collection site IV start    Drawn by Nurse    Sample type VENOUS    Comment NOTIFIED PHYSICIAN   POC CBG, ED   Collection Time: 02/08/24  4:33 PM  Result Value Ref Range   Glucose-Capillary 217 (H) 70 - 99 mg/dL  Urinalysis, Routine w reflex microscopic -Urine, Clean Catch   Collection Time: 02/08/24  4:39 PM  Result Value Ref Range   Color, Urine YELLOW YELLOW   APPearance CLEAR CLEAR   Specific Gravity, Urine 1.025 1.005 - 1.030   pH 7.0 5.0 - 8.0   Glucose, UA 250 (A) NEGATIVE mg/dL   Hgb urine dipstick NEGATIVE NEGATIVE   Bilirubin Urine NEGATIVE NEGATIVE   Ketones, ur NEGATIVE NEGATIVE mg/dL   Protein, ur 30 (A) NEGATIVE mg/dL   Nitrite NEGATIVE NEGATIVE   Leukocytes,Ua TRACE (A) NEGATIVE  Urinalysis, Microscopic (reflex)   Collection Time: 02/08/24  4:39 PM  Result Value Ref Range   RBC / HPF 0-5 0 - 5 RBC/hpf   WBC, UA 0-5 0 - 5 WBC/hpf   Bacteria, UA RARE (A) NONE SEEN   Squamous Epithelial / HPF 0-5 0 - 5 /HPF     EKG EKG Interpretation Date/Time:  Friday February 08 2024 14:44:57 EDT Ventricular Rate:  59 PR Interval:  152 QRS Duration:  77 QT Interval:  506 QTC Calculation: 502 R Axis:   -6  Text Interpretation: Sinus rhythm Confirmed by Virgina Norfolk [phone]) on 02/08/2024 4:40:44 PM  Radiology CT Head Wo Contrast Result Date:  02/08/2024 CLINICAL DATA:  Acute neuro deficit.  Hyperglycemia. EXAM: CT HEAD WITHOUT CONTRAST TECHNIQUE: Contiguous axial images were obtained from the base of the skull through the vertex without intravenous contrast. RADIATION  DOSE REDUCTION: This exam was performed according to the departmental dose-optimization program which includes automated exposure control, adjustment of the mA and/or kV according to patient size and/or use of iterative reconstruction technique. COMPARISON:  Head CT 06/09/2022 FINDINGS: Brain: There is a stable 5 x 5 x 5 mm hyperdense focus in the right frontal periventricular white matter image 3/14. There is no surrounding edema. Gray-white matter distinction is preserved. There is mild diffuse atrophy. There is no hydrocephalus or extra-axial fluid collection. There is no midline shift. There is mild diffuse atrophy. Vascular: Atherosclerotic calcifications are present within the cavernous internal carotid arteries. Skull: Normal. Negative for fracture or focal lesion. Sinuses/Orbits: No acute finding. Other: None. IMPRESSION: 1. No acute stroke identified. 2. Stable 5 mm mildly hyperdense focal area in the right frontal periventricular white matter. This is indeterminate and can be further evaluated with MRI. Electronically Signed   By: Darliss Cheney M.D.   On: 02/08/2024 15:29    Procedures Procedures    Medications Ordered in ED Medications  sodium chloride 0.9 % bolus 500 mL (0 mLs Intravenous Stopped 02/08/24 1638)    ED Course/ Medical Decision Making/ A&P                                 Medical Decision Making This patient presents to the ED for concern of ataxia, this involves an extensive number of treatment options, and is a complaint that carries with it a high risk of complications and morbidity.  The differential diagnosis includes CVA, DKA, sepsis/infection,    Co morbidities that complicate the patient evaluation  Poorly controlled T2DM, CABG, HTN, HLD,  hypomagnesemia, lupus   Additional history obtained:  Additional history and/or information obtained from chart review, notable for husband advising of poor DM care, poor diet,   Lab Tests:  I Ordered, and personally interpreted labs.  The pertinent results include:  normal pH at 7.349 - no evidence of acidosis on VBG; magnesium 1.9 (normal); WBC 3.6, Hgb 10.0; Cr 1.73, BUN 35 (c/w past values)    Imaging Studies ordered:  I ordered imaging studies including CT head Per radiologist interpretation:  IMPRESSION: 1. No acute stroke identified. 2. Stable 5 mm mildly hyperdense focal area in the right frontal periventricular white matter. This is indeterminate and can be further evaluated with MRI.      Cardiac Monitoring:  The patient was maintained on a cardiac monitor.  I personally viewed and interpreted the cardiac monitored which showed an underlying rhythm of: n/a  Test Considered:  Abnormal CT head: no stroke; stable hyperdensity 5 x 5 x 5 right front periventricular area. Consider MRI   Critical Interventions:  N/a   Consultations Obtained:  I requested consultation with the neurology, Dr. Otelia Limes,  and discussed lab and imaging findings as well as pertinent plan - they recommend: non-emergent MRI should be ordered by PCP.    Problem List / ED Course:  Feels off balance when walking - symptoms are recurrent Labs unremarkable - stable CT head without evidence for stroke or new finding. Stable hyperdense area right frontal area - will refer to PCP for outpatient MRI  Patient and husband updated on plan for discharge and are comfortable with plan. All questions answered.    Reevaluation:  After the interventions noted above, I reevaluated the patient and found that they have :stayed the same   Social Determinants of Health:  Poor insight into  diabetes care.  Reviewed instructions for increasing Lantus per PCP. Patient told: Take Lantus at the same time  ever day (she had been taking it sometimes at night, sometimes during the day) Check morning FBG, increase Lantus by one unit if over 110. Check the next morning and increase by 1 unit if over 110. Continue until better control.    Disposition:  After consideration of the diagnostic results and the patients response to treatment, I feel that the patient would benefit from discharge home.  I will send Esperanza Richters (her regular provider) information regarding visit today and need for outpatient MRI.    Amount and/or Complexity of Data Reviewed Labs: ordered. Radiology: ordered.           Final Clinical Impression(s) / ED Diagnoses Final diagnoses:  Hyperglycemia  Abnormal CT of the head    Rx / DC Orders ED Discharge Orders     None         Elpidio Anis, PA-C 02/08/24 1753    Virgina Norfolk, DO 02/08/24 2003

## 2024-02-08 NOTE — Telephone Encounter (Signed)
 FYI. Pt w/ blood sugar of 407 with dizziness. Appt at 2pm.

## 2024-02-09 LAB — IRON,TIBC AND FERRITIN PANEL
%SAT: 24 % (ref 16–45)
Ferritin: 331 ng/mL — ABNORMAL HIGH (ref 16–288)
Iron: 86 ug/dL (ref 45–160)
TIBC: 360 ug/dL (ref 250–450)

## 2024-02-09 LAB — VITAMIN B1: Vitamin B1 (Thiamine): 12 nmol/L (ref 8–30)

## 2024-02-11 NOTE — Addendum Note (Signed)
 Addended by: Gwenevere Abbot on: 02/11/2024 06:56 PM   Modules accepted: Orders

## 2024-02-12 ENCOUNTER — Encounter: Payer: Self-pay | Admitting: Medical Oncology

## 2024-02-12 ENCOUNTER — Inpatient Hospital Stay (HOSPITAL_BASED_OUTPATIENT_CLINIC_OR_DEPARTMENT_OTHER): Payer: Medicare Other | Admitting: Medical Oncology

## 2024-02-12 ENCOUNTER — Inpatient Hospital Stay: Payer: Medicare Other | Attending: Hematology & Oncology

## 2024-02-12 ENCOUNTER — Inpatient Hospital Stay: Payer: Medicare Other

## 2024-02-12 VITALS — BP 124/35 | HR 54 | Temp 97.6°F | Resp 18 | Ht 61.0 in | Wt 114.0 lb

## 2024-02-12 DIAGNOSIS — D500 Iron deficiency anemia secondary to blood loss (chronic): Secondary | ICD-10-CM | POA: Diagnosis present

## 2024-02-12 DIAGNOSIS — N189 Chronic kidney disease, unspecified: Secondary | ICD-10-CM | POA: Diagnosis not present

## 2024-02-12 DIAGNOSIS — D631 Anemia in chronic kidney disease: Secondary | ICD-10-CM | POA: Diagnosis not present

## 2024-02-12 DIAGNOSIS — E538 Deficiency of other specified B group vitamins: Secondary | ICD-10-CM | POA: Diagnosis not present

## 2024-02-12 LAB — CBC
HCT: 33.7 % — ABNORMAL LOW (ref 36.0–46.0)
Hemoglobin: 11.5 g/dL — ABNORMAL LOW (ref 12.0–15.0)
MCH: 29.9 pg (ref 26.0–34.0)
MCHC: 34.1 g/dL (ref 30.0–36.0)
MCV: 87.5 fL (ref 80.0–100.0)
Platelets: 254 10*3/uL (ref 150–400)
RBC: 3.85 MIL/uL — ABNORMAL LOW (ref 3.87–5.11)
RDW: 12.5 % (ref 11.5–15.5)
WBC: 3.8 10*3/uL — ABNORMAL LOW (ref 4.0–10.5)
nRBC: 0 % (ref 0.0–0.2)

## 2024-02-12 LAB — RETIC PANEL
Immature Retic Fract: 10.5 % (ref 2.3–15.9)
RBC.: 3.88 MIL/uL (ref 3.87–5.11)
Retic Count, Absolute: 68.7 10*3/uL (ref 19.0–186.0)
Retic Ct Pct: 1.8 % (ref 0.4–3.1)
Reticulocyte Hemoglobin: 32.9 pg (ref >27.9)
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
## 2024-02-13 ENCOUNTER — Other Ambulatory Visit (HOSPITAL_COMMUNITY)

## 2024-02-15 ENCOUNTER — Telehealth: Payer: Self-pay

## 2024-02-15 ENCOUNTER — Telehealth: Payer: Self-pay | Admitting: Medical Oncology

## 2024-02-15 NOTE — Telephone Encounter (Signed)
 Called patient to scheudle her for a lab only on 04/14 per last ov los and patient refused at this time.

## 2024-02-15 NOTE — Telephone Encounter (Signed)
 Needs ED f/u w/ [facility] Dredge per Dr. Drue Novel

## 2024-02-18 NOTE — Telephone Encounter (Signed)
 Pt called and lvm to return call to schedule an OV

## 2024-02-20 ENCOUNTER — Ambulatory Visit: Admitting: Medical

## 2024-02-20 VITALS — BP 152/60 | HR 70 | Resp 18 | Ht 61.0 in | Wt 116.8 lb

## 2024-02-20 DIAGNOSIS — N63 Unspecified lump in unspecified breast: Secondary | ICD-10-CM | POA: Diagnosis not present

## 2024-02-20 DIAGNOSIS — R42 Dizziness and giddiness: Secondary | ICD-10-CM

## 2024-02-20 DIAGNOSIS — Z794 Long term (current) use of insulin: Secondary | ICD-10-CM

## 2024-02-20 DIAGNOSIS — E1100 Type 2 diabetes mellitus with hyperosmolarity without nonketotic hyperglycemic-hyperosmolar coma (NKHHC): Secondary | ICD-10-CM

## 2024-02-20 DIAGNOSIS — R930 Abnormal findings on diagnostic imaging of skull and head, not elsewhere classified: Secondary | ICD-10-CM | POA: Diagnosis not present

## 2024-02-20 DIAGNOSIS — S50312A Abrasion of left elbow, initial encounter: Secondary | ICD-10-CM

## 2024-02-20 DIAGNOSIS — Z23 Encounter for immunization: Secondary | ICD-10-CM

## 2024-02-20 DIAGNOSIS — T148XXA Other injury of unspecified body region, initial encounter: Secondary | ICD-10-CM

## 2024-02-20 MED ORDER — SILVER SULFADIAZINE 1 % EX CREA: 1.0000 | TOPICAL_CREAM | Freq: Every day | CUTANEOUS | 0 refills | Status: DC

## 2024-02-20 NOTE — Progress Notes (Unsigned)
 Subjective:    Patient ID: Jasmine Keith, female    DOB: 14-Jan-1951, 73 y.o.   MRN: [id number]  HPI Discussed the use of AI scribe software for clinical note transcription with the patient, who gave verbal consent to proceed.  History of Present Illness   Jasmine Keith is a 73 year old female with diabetes who presents with dizziness and imbalance.  She experienced dizziness and imbalance, prompting an emergency department visit on February 08, 2024. A CT scan of the head revealed a hyperdense area in the right frontal lobe, which was deemed indeterminate, and an MRI was recommended as outpatient..  Her dizziness and imbalance were attributed to poorly controlled diabetes, with blood sugar levels reaching as high as 450 mg/dL. She was not administering her insulin correctly, contributing to the elevated glucose levels. Since then, she has been adjusting her basal insulin dose based on her morning blood sugar readings, which have improved to 117 mg/dL this morning. She is currently taking 35 units of basal insulin at night.   Overall sugar level now much better controlled and she described dizziness/balance issues resolved with better glucose control.  She experienced a fall due to tripping over a rug today before coming to office visit, resulting in a left forearm abrasion measuring approximately three inches by one centimeter. The abrasion is superficial, with no deep pain or bone involvement. She cleaned area with peroxide and wrapped area in gause.  There is a mention of a previous mammogram that was supposed to be scheduled at the [facility], but she has not been contacted for an appointment.  She believes she received a tetanus shot recently, possibly during a hospital visit, but there is no record of it in the current system.        Review of Systems See hpi    Objective:   Physical Exam  General- No acute distress. Pleasant patient. Neck- Full  range of motion, no jvd Lungs- Clear, even and unlabored. Heart- regular rate and rhythm. Neurologic- CNII- XII grossly intact.  SKIN: Abrasion on left forearm, 3 inches by 1 cm, superficial, no deep pain.      Assessment & Plan:   Assessment and Plan    Hyperglycemia Severe hyperglycemia due to improper insulin administration improved with basal insulin adjustment. - Adjust basal insulin based on morning glucose levels, adding one unit if levels exceed 110 mg/dL. Stop titration when morning sugars hit 110. Current on basal insulin 35 units at night. - Monitor for random hyperglycemia during the day and consider adding mealtime insulin if necessary.  Right frontal lobe hyperdense area -former dizziness resolved with much better glucose control. Indeterminate hyperdense area in right frontal lobe on CT. MRI recommended for further evaluation to assess growth or calcification. - Proceed with scheduled MRI to evaluate the hyperdense area in the right frontal lobe.  Breast lump Small breast lump identified, diagnostic mammogram pending scheduling. - Schedule a diagnostic mammogram at [facility] for the breast lump.  Left forearm abrasion Superficial abrasion on left forearm, no deep pain or bone involvement. Tetanus status uncertain. -Non-stick dressing applied to left forearm abrasion - Dress wound daily as demonstrated today. Apply Silvadene cream once daily to the abrasion. - Redress the wound daily and monitor for signs of infection. - Follow up in 10 days to recheck the abrasion. - Obtain a tetanus booster from the pharmacy downstairs.        Time spent with patient today  was 42  minutes which consisted of chart review, discussing diagnosis, work up treatment and documentation. Initiall time spent addressing follow up from ED followed by discussion on mammgram order. Then at the end pt revealed abrasion which took some time event to remove heavily wrapped  wound attached in dried blood. More time to educate on  treatment plan and plan going forward.

## 2024-02-20 NOTE — Patient Instructions (Signed)
 Hyperglycemia Severe hyperglycemia due to improper insulin administration improved with basal insulin adjustment. - Adjust basal insulin based on morning glucose levels, adding one unit if levels exceed 110 mg/dL. Stop titration when morning sugars hit 110. Current on basal insulin 35 units at night. - Monitor for random hyperglycemia during the day and consider adding mealtime insulin if necessary.  Right frontal lobe hyperdense area Indeterminate hyperdense area in right frontal lobe on CT. MRI recommended for further evaluation to assess growth or calcification. - Proceed with scheduled MRI to evaluate the hyperdense area in the right frontal lobe.  Breast lump Small breast lump identified, diagnostic mammogram pending scheduling. - Schedule a diagnostic mammogram at [facility] for the breast lump.  Left forearm abrasion Superficial abrasion on left forearm, no deep pain or bone involvement. Tetanus status uncertain. - Apply Silvadene cream once daily to the abrasion. - Redress the wound daily and monitor for signs of infection. - Follow up in 10 days to recheck the abrasion. - Obtain a tetanus booster from the pharmacy.

## 2024-02-29 ENCOUNTER — Telehealth: Payer: Self-pay | Admitting: Pharmacist

## 2024-02-29 DIAGNOSIS — E782 Mixed hyperlipidemia: Secondary | ICD-10-CM

## 2024-02-29 NOTE — Telephone Encounter (Signed)
 Spoke with patient's husband (per DPR) about getting repeat lipid panel done at [facility] office.

## 2024-03-03 ENCOUNTER — Ambulatory Visit (HOSPITAL_COMMUNITY)
Admission: RE | Admit: 2024-03-03 | Discharge: 2024-03-03 | Disposition: A | Discharge status: Home / Self Care | Source: Ambulatory Visit | Attending: Medical | Admitting: Medical

## 2024-03-03 DIAGNOSIS — R270 Ataxia, unspecified: Secondary | ICD-10-CM | POA: Insufficient documentation

## 2024-03-03 DIAGNOSIS — R42 Dizziness and giddiness: Secondary | ICD-10-CM | POA: Insufficient documentation

## 2024-03-03 DIAGNOSIS — R930 Abnormal findings on diagnostic imaging of skull and head, not elsewhere classified: Secondary | ICD-10-CM | POA: Diagnosis present

## 2024-03-06 ENCOUNTER — Encounter

## 2024-03-06 ENCOUNTER — Inpatient Hospital Stay: Admission: RE | Admit: 2024-03-06 | Source: Ambulatory Visit

## 2024-03-12 LAB — LIPID PANEL
Chol/HDL Ratio: 6.1 ratio — ABNORMAL HIGH (ref 0.0–4.4)
Cholesterol, Total: 134 mg/dL (ref 100–199)
HDL: 22 mg/dL — ABNORMAL LOW (ref >39)
LDL Chol Calc (NIH): 60 mg/dL (ref 0–99)
Triglycerides: 329 mg/dL — ABNORMAL HIGH (ref 0–149)
VLDL Cholesterol Cal: 52 mg/dL — ABNORMAL HIGH (ref 5–40)

## 2024-03-13 NOTE — Addendum Note (Signed)
 Addended by: Arta Stump D on: 03/13/2024 07:17 AM   Modules accepted: Orders

## 2024-03-26 ENCOUNTER — Other Ambulatory Visit: Payer: Self-pay | Admitting: Medical

## 2024-03-26 DIAGNOSIS — N63 Unspecified lump in unspecified breast: Secondary | ICD-10-CM

## 2024-03-26 DIAGNOSIS — N6459 Other signs and symptoms in breast: Secondary | ICD-10-CM

## 2024-03-28 ENCOUNTER — Other Ambulatory Visit: Payer: Self-pay | Admitting: Medical

## 2024-04-10 ENCOUNTER — Ambulatory Visit: Payer: Medicare Other

## 2024-04-10 VITALS — BP 121/49 | HR 60 | Temp 98.0°F | Resp 18 | Ht 61.0 in | Wt 112.6 lb

## 2024-04-10 DIAGNOSIS — E782 Mixed hyperlipidemia: Secondary | ICD-10-CM

## 2024-04-10 DIAGNOSIS — Z951 Presence of aortocoronary bypass graft: Secondary | ICD-10-CM | POA: Diagnosis not present

## 2024-04-10 MED ORDER — INCLISIRAN SODIUM 284 MG/1.5ML ~~LOC~~ SOSY
284.0000 mg | PREFILLED_SYRINGE | Freq: Once | SUBCUTANEOUS | Status: AC
  Administered 2024-04-10: 284 mg via SUBCUTANEOUS
  Filled 2024-04-10: qty 1.5

## 2024-04-10 NOTE — Progress Notes (Signed)
 Diagnosis: Osteoporosis  Provider:  Mannam, Praveen MD  Procedure: Injection  Leqvio  (inclisiran), Dose: 284 mg, Site: subcutaneous, Number of injections: 1  Injection Site(s): Right arm  Post Care: Patient declined observation  Discharge: Condition: Good, Destination: Home . AVS Provided  Performed by:  Star East, LPN

## 2024-04-15 ENCOUNTER — Encounter: Payer: Self-pay | Admitting: Cardiology

## 2024-04-15 ENCOUNTER — Ambulatory Visit: Attending: Cardiology | Admitting: Cardiology

## 2024-04-15 VITALS — BP 132/64 | HR 60 | Ht 61.0 in | Wt 113.0 lb

## 2024-04-15 DIAGNOSIS — Z951 Presence of aortocoronary bypass graft: Secondary | ICD-10-CM | POA: Insufficient documentation

## 2024-04-15 DIAGNOSIS — E088 Diabetes mellitus due to underlying condition with unspecified complications: Secondary | ICD-10-CM | POA: Diagnosis present

## 2024-04-15 DIAGNOSIS — E781 Pure hyperglyceridemia: Secondary | ICD-10-CM | POA: Insufficient documentation

## 2024-04-15 DIAGNOSIS — I2510 Atherosclerotic heart disease of native coronary artery without angina pectoris: Secondary | ICD-10-CM | POA: Diagnosis not present

## 2024-04-15 DIAGNOSIS — I10 Essential (primary) hypertension: Secondary | ICD-10-CM | POA: Diagnosis not present

## 2024-04-15 MED ORDER — ICOSAPENT ETHYL 1 G PO CAPS: 2.0000 g | ORAL_CAPSULE | Freq: Two times a day (BID) | ORAL | 3 refills | Status: DC
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
## 2024-04-15 NOTE — Patient Instructions (Addendum)
 Medication Instructions:  Your physician recommends that you continue on you Please start taking Fish oil 2 grams twice daily.  *If you need a refill on your cardiac medications before your next appointment, please call your pharmacy*   Follow-Up: At [facility], you and your health needs are our priority.  As part of our continuing mission to provide you with exceptional heart care, our providers are all part of one team.  This team includes your primary Cardiologist (physician) and Advanced Practice Providers or APPs (Physician Assistants and Nurse Practitioners) who all work together to provide you with the care you need, when you need it.  Your next appointment:   9 month(s)  Provider:   Hillis Lu, MD

## 2024-04-16 ENCOUNTER — Ambulatory Visit
Admission: RE | Admit: 2024-04-16 | Discharge: 2024-04-16 | Disposition: A | Discharge status: Home / Self Care | Source: Ambulatory Visit | Attending: Medical | Admitting: Medical

## 2024-04-16 ENCOUNTER — Other Ambulatory Visit: Payer: Self-pay | Admitting: Medical

## 2024-04-16 ENCOUNTER — Ambulatory Visit: Payer: Self-pay | Admitting: Medical

## 2024-04-16 DIAGNOSIS — N6459 Other signs and symptoms in breast: Secondary | ICD-10-CM

## 2024-04-16 DIAGNOSIS — N63 Unspecified lump in unspecified breast: Secondary | ICD-10-CM

## 2024-04-17 ENCOUNTER — Other Ambulatory Visit: Payer: Self-pay | Admitting: Medical

## 2024-04-18 ENCOUNTER — Ambulatory Visit (INDEPENDENT_AMBULATORY_CARE_PROVIDER_SITE_OTHER): Admitting: Medical

## 2024-04-18 ENCOUNTER — Encounter: Payer: Self-pay | Admitting: Medical

## 2024-04-18 VITALS — BP 118/70 | HR 65 | Ht 61.0 in | Wt 112.0 lb

## 2024-04-18 DIAGNOSIS — I10 Essential (primary) hypertension: Secondary | ICD-10-CM

## 2024-04-18 DIAGNOSIS — D649 Anemia, unspecified: Secondary | ICD-10-CM | POA: Diagnosis not present

## 2024-04-18 DIAGNOSIS — R944 Abnormal results of kidney function studies: Secondary | ICD-10-CM

## 2024-04-18 DIAGNOSIS — E1100 Type 2 diabetes mellitus with hyperosmolarity without nonketotic hyperglycemic-hyperosmolar coma (NKHHC): Secondary | ICD-10-CM

## 2024-04-18 MED ORDER — FENOFIBRATE 160 MG PO TABS: 160.0000 mg | ORAL_TABLET | Freq: Every day | ORAL | 3 refills | Status: DC

## 2024-04-18 NOTE — Patient Instructions (Addendum)
 Coronary Artery Disease without Angina Coronary artery disease without angina. Blood pressure controlled at 118/70 mmHg. LDL cholesterol at target 60 mg/dL. Emphasized secondary prevention through diet and exercise to prevent progression. - Continue current medications for coronary artery disease. - Encourage diet and exercise as secondary prevention.  Diabetes Mellitus Diabetes management under review. Previous A1c 9.2%. Current glucose 118-130 mg/dL. Target A1c 7-7.5%. Reassessment needed to determine control status. - Order A1c test to assess current diabetes control.  Chronic Kidney Disease Chronic kidney disease with creatinine 1.73 mg/dL, GFR 31 VW/UJW/1.19J. Nephrology referral not completed. Possible erythropoietin  deficiency contributing to anemia. Nephrology evaluation advised. - Refer to nephrology for further evaluation of kidney function. - Refer to hematology for evaluation of potential erythropoietin  deficiency and anemia. - Order metabolic panel to assess kidney function and anemia.  Anemia Anemia suspected, possibly due to erythropoietin  deficiency from chronic kidney disease. Further evaluation needed. - Order complete blood count to assess anemia. - Refer to hematology for further evaluation.  Follow-up Follow-up necessary to review lab results and ensure specialist appointments before travel in early July. - Review lab results once available. - Ensure nephrology and hematology appointments are scheduled before she leaves for vacation in early July.  [facility] [address], [place], [place] [address] Hours:  Open ? Closes 5?PM Phone: [phone]      Diabetes Mellitus and Nutrition, Adult When you have diabetes, or diabetes mellitus, it is very important to have healthy eating habits because your blood sugar (glucose) levels are greatly affected by what you eat and drink. Eating healthy foods in the right amounts, at about the same times every day,  can help you: Manage your blood glucose. Lower your risk of heart disease. Improve your blood pressure. Reach or maintain a healthy weight. What can affect my meal plan? Every person with diabetes is different, and each person has different needs for a meal plan. Your health care provider may recommend that you work with a dietitian to make a meal plan that is best for you. Your meal plan may vary depending on factors such as: The calories you need. The medicines you take. Your weight. Your blood glucose, blood pressure, and cholesterol levels. Your activity level. Other health conditions you have, such as heart or kidney disease. How do carbohydrates affect me? Carbohydrates, also called carbs, affect your blood glucose level more than any other type of food. Eating carbs raises the amount of glucose in your blood. It is important to know how many carbs you can safely have in each meal. This is different for every person. Your dietitian can help you calculate how many carbs you should have at each meal and for each snack. How does alcohol affect me? Alcohol can cause a decrease in blood glucose (hypoglycemia), especially if you use insulin  or take certain diabetes medicines by mouth. Hypoglycemia can be a life-threatening condition. Symptoms of hypoglycemia, such as sleepiness, dizziness, and confusion, are similar to symptoms of having too much alcohol. Do not drink alcohol if: Your health care provider tells you not to drink. You are pregnant, may be pregnant, or are planning to become pregnant. If you drink alcohol: Limit how much you have to: 0-1 drink a day for women. 0-2 drinks a day for men. Know how much alcohol is in your drink. In the [place] one drink equals one 12 oz bottle of beer (355 mL), one 5 oz glass of wine (148 mL), or one 1 oz glass of hard liquor (  44 mL). Keep yourself hydrated with water, diet soda, or unsweetened iced tea. Keep in mind that regular soda, juice, and  other mixers may contain a lot of sugar and must be counted as carbs. What are tips for following this plan?  Reading food labels Start by checking the serving size on the Nutrition Facts label of packaged foods and drinks. The number of calories and the amount of carbs, fats, and other nutrients listed on the label are based on one serving of the item. Many items contain more than one serving per package. Check the total grams (g) of carbs in one serving. Check the number of grams of saturated fats and trans fats in one serving. Choose foods that have a low amount or none of these fats. Check the number of milligrams (mg) of salt (sodium) in one serving. Most people should limit total sodium intake to less than 2,300 mg per day. Always check the nutrition information of foods labeled as low-fat or nonfat. These foods may be higher in added sugar or refined carbs and should be avoided. Talk to your dietitian to identify your daily goals for nutrients listed on the label. Shopping Avoid buying canned, pre-made, or processed foods. These foods tend to be high in fat, sodium, and added sugar. Shop around the outside edge of the grocery store. This is where you will most often find fresh fruits and vegetables, bulk grains, fresh meats, and fresh dairy products. Cooking Use low-heat cooking methods, such as baking, instead of high-heat cooking methods, such as deep frying. Cook using healthy oils, such as olive, canola, or sunflower oil. Avoid cooking with butter, cream, or high-fat meats. Meal planning Eat meals and snacks regularly, preferably at the same times every day. Avoid going long periods of time without eating. Eat foods that are high in fiber, such as fresh fruits, vegetables, beans, and whole grains. Eat 4-6 oz (112-168 g) of lean protein each day, such as lean meat, chicken, fish, eggs, or tofu. One ounce (oz) (28 g) of lean protein is equal to: 1 oz (28 g) of meat, chicken, or  fish. 1 egg.  cup (62 g) of tofu. Eat some foods each day that contain healthy fats, such as avocado, nuts, seeds, and fish. What foods should I eat? Fruits Berries. Apples. Oranges. Peaches. Apricots. Plums. Grapes. Mangoes. Papayas. Pomegranates. Kiwi. Cherries. Vegetables Leafy greens, including lettuce, spinach, kale, chard, collard greens, mustard greens, and cabbage. Beets. Cauliflower. Broccoli. Carrots. Green beans. Tomatoes. Peppers. Onions. Cucumbers. Brussels sprouts. Grains Whole grains, such as whole-wheat or whole-grain bread, crackers, tortillas, cereal, and pasta. Unsweetened oatmeal. Quinoa. Brown or wild rice. Meats and other proteins Seafood. Poultry without skin. Lean cuts of poultry and beef. Tofu. Nuts. Seeds. Dairy Low-fat or fat-free dairy products such as milk, yogurt, and cheese. The items listed above may not be a complete list of foods and beverages you can eat and drink. Contact a dietitian for more information. What foods should I avoid? Fruits Fruits canned with syrup. Vegetables Canned vegetables. Frozen vegetables with butter or cream sauce. Grains Refined white flour and flour products such as bread, pasta, snack foods, and cereals. Avoid all processed foods. Meats and other proteins Fatty cuts of meat. Poultry with skin. Breaded or fried meats. Processed meat. Avoid saturated fats. Dairy Full-fat yogurt, cheese, or milk. Beverages Sweetened drinks, such as soda or iced tea. The items listed above may not be a complete list of foods and beverages you should avoid. Contact a  dietitian for more information. Questions to ask a health care provider Do I need to meet with a certified diabetes care and education specialist? Do I need to meet with a dietitian? What number can I call if I have questions? When are the best times to check my blood glucose? Where to find more information: American Diabetes Association: diabetes.org Academy of Nutrition and  Dietetics: eatright.[employer] of Diabetes and Digestive and Kidney Diseases: StageSync.si Association of Diabetes Care & Education Specialists: diabeteseducator.org Summary It is important to have healthy eating habits because your blood sugar (glucose) levels are greatly affected by what you eat and drink. It is important to use alcohol carefully. A healthy meal plan will help you manage your blood glucose and lower your risk of heart disease. Your health care provider may recommend that you work with a dietitian to make a meal plan that is best for you. This information is not intended to replace advice given to you by your health care provider. Make sure you discuss any questions you have with your health care provider. Document Revised: 05/25/2020 Document Reviewed: 05/26/2020 Elsevier Patient Education  2024 [employer].
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
## 2024-04-19 LAB — COMPREHENSIVE METABOLIC PANEL WITH GFR
AG Ratio: 1.3 (calc) (ref 1.0–2.5)
ALT: 9 U/L (ref 6–29)
AST: 18 U/L (ref 10–35)
Albumin: 4.1 g/dL (ref 3.6–5.1)
Alkaline phosphatase (APISO): 53 U/L (ref 37–153)
BUN/Creatinine Ratio: 19 (calc) (ref 6–22)
BUN: 35 mg/dL — ABNORMAL HIGH (ref 7–25)
CO2: 22 mmol/L (ref 20–32)
Calcium: 9.8 mg/dL (ref 8.6–10.4)
Chloride: 101 mmol/L (ref 98–110)
Creat: 1.8 mg/dL — ABNORMAL HIGH (ref 0.60–1.00)
Globulin: 3.2 g/dL (ref 1.9–3.7)
Glucose, Bld: 316 mg/dL — ABNORMAL HIGH (ref 65–99)
Potassium: 4.2 mmol/L (ref 3.5–5.3)
Sodium: 134 mmol/L — ABNORMAL LOW (ref 135–146)
Total Bilirubin: 0.5 mg/dL (ref 0.2–1.2)
Total Protein: 7.3 g/dL (ref 6.1–8.1)
eGFR: 30 mL/min/{1.73_m2} — ABNORMAL LOW (ref >=60)

## 2024-04-19 LAB — CBC WITH DIFFERENTIAL/PLATELET
Absolute Lymphocytes: 425 {cells}/uL — ABNORMAL LOW (ref 850–3900)
Absolute Monocytes: 150 {cells}/uL — ABNORMAL LOW (ref 200–950)
Basophils Absolute: 31 {cells}/uL (ref 0–200)
Basophils Relative: 0.9 %
Eosinophils Absolute: 41 {cells}/uL (ref 15–500)
Eosinophils Relative: 1.2 %
HCT: 33.8 % — ABNORMAL LOW (ref 35.0–45.0)
Hemoglobin: 11 g/dL — ABNORMAL LOW (ref 11.7–15.5)
MCH: 29.3 pg (ref 27.0–33.0)
MCHC: 32.5 g/dL (ref 32.0–36.0)
MCV: 89.9 fL (ref 80.0–100.0)
MPV: 10.8 fL (ref 7.5–12.5)
Monocytes Relative: 4.4 %
Neutro Abs: 2754 {cells}/uL (ref 1500–7800)
Neutrophils Relative %: 81 %
Platelets: 256 10*3/uL (ref 140–400)
RBC: 3.76 10*6/uL — ABNORMAL LOW (ref 3.80–5.10)
RDW: 12.5 % (ref 11.0–15.0)
Total Lymphocyte: 12.5 %
WBC: 3.4 10*3/uL — ABNORMAL LOW (ref 3.8–10.8)

## 2024-04-19 LAB — MICROALBUMIN / CREATININE URINE RATIO
Creatinine, Urine: 132 mg/dL (ref 20–275)
Microalb Creat Ratio: 44 mg/g{creat} — ABNORMAL HIGH (ref <30)
Microalb, Ur: 5.8 mg/dL

## 2024-04-19 LAB — HEMOGLOBIN A1C
Hgb A1c MFr Bld: 7.9 % — ABNORMAL HIGH (ref <5.7)
Mean Plasma Glucose: 180 mg/dL
eAG (mmol/L): 10 mmol/L

## 2024-04-21 ENCOUNTER — Other Ambulatory Visit (HOSPITAL_BASED_OUTPATIENT_CLINIC_OR_DEPARTMENT_OTHER): Payer: Self-pay

## 2024-04-21 ENCOUNTER — Other Ambulatory Visit (HOSPITAL_COMMUNITY): Payer: Self-pay

## 2024-04-21 ENCOUNTER — Telehealth: Payer: Self-pay | Admitting: Cardiology

## 2024-04-21 ENCOUNTER — Telehealth: Payer: Self-pay

## 2024-04-21 ENCOUNTER — Other Ambulatory Visit

## 2024-04-21 ENCOUNTER — Ambulatory Visit: Payer: Self-pay | Admitting: Medical

## 2024-04-21 MED ORDER — ICOSAPENT ETHYL 1 G PO CAPS
2.0000 g | ORAL_CAPSULE | Freq: Two times a day (BID) | ORAL | 12 refills | Status: DC
  Filled 2024-04-21 – 2024-04-22 (×2): qty 120, 30d supply, fill #0

## 2024-04-21 NOTE — Telephone Encounter (Signed)
 Pharmacy Patient Advocate Encounter  Insurance verification completed.   The patient is insured through [employer] test claim for [employer] .  The current 30 day co-pay is, $47.99.  No PA needed at this time.  This test claim was processed through [facility]- copay amounts may vary at other pharmacies due to pharmacy/plan contracts, or as the patient moves through the different stages of their insurance plan.    Generic is covered without a PA. Brand is non formulary/not covered on plan.

## 2024-04-21 NOTE — Telephone Encounter (Signed)
 Pharmacy Patient Advocate Encounter   Insurance verification completed.   The patient is insured through [employer] test claim for [employer] .  The current 30 day co-pay is, $47.99.  No PA needed at this time.   This test claim was processed through [facility]- copay amounts may vary at other pharmacies due to pharmacy/plan contracts, or as the patient moves through the different stages of their insurance plan.

## 2024-04-21 NOTE — Telephone Encounter (Signed)
 Pt aware and RX sent to [facility]

## 2024-04-21 NOTE — Telephone Encounter (Signed)
 Pt c/o medication issue:  1. Name of Medication: Vascepa   2. How are you currently taking this medication (dosage and times per day)? -  3. Are you having a reaction (difficulty breathing--STAT)? -  4. What is your medication issue? Pt went to pharmacy to pick up med and it was $182 so did not pick up. Requesting cb to discuss

## 2024-04-21 NOTE — Telephone Encounter (Signed)
 Per test claim generic is covered. Please see separate encounter for details.

## 2024-04-22 ENCOUNTER — Other Ambulatory Visit (HOSPITAL_BASED_OUTPATIENT_CLINIC_OR_DEPARTMENT_OTHER): Payer: Self-pay

## 2024-04-30 ENCOUNTER — Ambulatory Visit: Payer: Medicare Other | Attending: Internal Medicine | Admitting: Internal Medicine

## 2024-04-30 ENCOUNTER — Encounter: Payer: Self-pay | Admitting: Internal Medicine

## 2024-04-30 ENCOUNTER — Other Ambulatory Visit: Payer: Self-pay | Admitting: Internal Medicine

## 2024-04-30 VITALS — BP 118/64 | HR 73 | Resp 14 | Ht 61.0 in | Wt 111.0 lb

## 2024-04-30 DIAGNOSIS — M329 Systemic lupus erythematosus, unspecified: Secondary | ICD-10-CM | POA: Diagnosis present

## 2024-04-30 DIAGNOSIS — M797 Fibromyalgia: Secondary | ICD-10-CM | POA: Diagnosis present

## 2024-04-30 DIAGNOSIS — Z79899 Other long term (current) drug therapy: Secondary | ICD-10-CM | POA: Insufficient documentation

## 2024-04-30 NOTE — Patient Instructions (Signed)
 I recommend checking out the [place] [facility] [place] patient-centered guide for fibromyalgia and chronic pain management: [email or web address]
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
## 2024-05-01 ENCOUNTER — Inpatient Hospital Stay: Attending: Hematology & Oncology

## 2024-05-01 ENCOUNTER — Inpatient Hospital Stay (HOSPITAL_BASED_OUTPATIENT_CLINIC_OR_DEPARTMENT_OTHER): Admitting: Medical Oncology

## 2024-05-01 ENCOUNTER — Encounter: Payer: Self-pay | Admitting: Medical Oncology

## 2024-05-01 ENCOUNTER — Inpatient Hospital Stay

## 2024-05-01 VITALS — BP 147/53 | HR 63 | Temp 98.4°F | Resp 18 | Ht 61.0 in | Wt 110.8 lb

## 2024-05-01 DIAGNOSIS — D500 Iron deficiency anemia secondary to blood loss (chronic): Secondary | ICD-10-CM

## 2024-05-01 DIAGNOSIS — E538 Deficiency of other specified B group vitamins: Secondary | ICD-10-CM | POA: Diagnosis not present

## 2024-05-01 DIAGNOSIS — D72819 Decreased white blood cell count, unspecified: Secondary | ICD-10-CM | POA: Insufficient documentation

## 2024-05-01 DIAGNOSIS — N179 Acute kidney failure, unspecified: Secondary | ICD-10-CM | POA: Diagnosis not present

## 2024-05-01 DIAGNOSIS — N189 Chronic kidney disease, unspecified: Secondary | ICD-10-CM | POA: Diagnosis present

## 2024-05-01 DIAGNOSIS — D631 Anemia in chronic kidney disease: Secondary | ICD-10-CM

## 2024-05-01 DIAGNOSIS — E611 Iron deficiency: Secondary | ICD-10-CM | POA: Insufficient documentation

## 2024-05-01 LAB — IRON AND IRON BINDING CAPACITY (CC-WL,HP ONLY)
Iron: 81 ug/dL (ref 28–170)
Saturation Ratios: 20 % (ref 10.4–31.8)
TIBC: 402 ug/dL (ref 250–450)
UIBC: 321 ug/dL (ref 148–442)

## 2024-05-01 LAB — CBC WITH DIFFERENTIAL (CANCER CENTER ONLY)
Abs Immature Granulocytes: 0.05 10*3/uL (ref 0.00–0.07)
Basophils Absolute: 0 10*3/uL (ref 0.0–0.1)
Basophils Relative: 1 %
Eosinophils Absolute: 0.1 10*3/uL (ref 0.0–0.5)
Eosinophils Relative: 1 %
HCT: 31.6 % — ABNORMAL LOW (ref 36.0–46.0)
Hemoglobin: 10.4 g/dL — ABNORMAL LOW (ref 12.0–15.0)
Immature Granulocytes: 1 %
Lymphocytes Relative: 12 %
Lymphs Abs: 0.4 10*3/uL — ABNORMAL LOW (ref 0.7–4.0)
MCH: 28.3 pg (ref 26.0–34.0)
MCHC: 32.9 g/dL (ref 30.0–36.0)
MCV: 86.1 fL (ref 80.0–100.0)
Monocytes Absolute: 0.1 10*3/uL (ref 0.1–1.0)
Monocytes Relative: 3 %
Neutro Abs: 2.9 10*3/uL (ref 1.7–7.7)
Neutrophils Relative %: 82 %
Platelet Count: 271 10*3/uL (ref 150–400)
RBC: 3.67 MIL/uL — ABNORMAL LOW (ref 3.87–5.11)
RDW: 12.9 % (ref 11.5–15.5)
WBC Count: 3.6 10*3/uL — ABNORMAL LOW (ref 4.0–10.5)
nRBC: 0 % (ref 0.0–0.2)

## 2024-05-01 LAB — CMP (CANCER CENTER ONLY)
ALT: 10 U/L (ref 0–44)
AST: 25 U/L (ref 15–41)
Albumin: 4.1 g/dL (ref 3.5–5.0)
Alkaline Phosphatase: 39 U/L (ref 38–126)
Anion gap: 9 (ref 5–15)
BUN: 35 mg/dL — ABNORMAL HIGH (ref 8–23)
CO2: 24 mmol/L (ref 22–32)
Calcium: 10.1 mg/dL (ref 8.9–10.3)
Chloride: 103 mmol/L (ref 98–111)
Creatinine: 1.63 mg/dL — ABNORMAL HIGH (ref 0.44–1.00)
GFR, Estimated: 34 mL/min — ABNORMAL LOW (ref >60)
Glucose, Bld: 190 mg/dL — ABNORMAL HIGH (ref 70–99)
Potassium: 5 mmol/L (ref 3.5–5.1)
Sodium: 136 mmol/L (ref 135–145)
Total Bilirubin: 0.6 mg/dL (ref 0.0–1.2)
Total Protein: 7.1 g/dL (ref 6.5–8.1)

## 2024-05-01 LAB — RETIC PANEL
Immature Retic Fract: 11 % (ref 2.3–15.9)
RBC.: 3.71 MIL/uL — ABNORMAL LOW (ref 3.87–5.11)
Retic Count, Absolute: 85.9 10*3/uL (ref 19.0–186.0)
Retic Ct Pct: 2.3 % (ref 0.4–3.1)
Reticulocyte Hemoglobin: 33.1 pg (ref >27.9)

## 2024-05-01 LAB — VITAMIN B12: Vitamin B-12: 342 pg/mL (ref 180–914)

## 2024-05-01 LAB — FERRITIN: Ferritin: 639 ng/mL — ABNORMAL HIGH (ref 11–307)

## 2024-05-01 MED ORDER — DARBEPOETIN ALFA 300 MCG/0.6ML IJ SOSY
300.0000 ug | PREFILLED_SYRINGE | Freq: Once | INTRAMUSCULAR | Status: AC
  Administered 2024-05-01: 300 ug via SUBCUTANEOUS
  Filled 2024-05-01: qty 0.6

## 2024-05-01 NOTE — Patient Instructions (Signed)
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
## 2024-05-02 LAB — ANTI-SMITH ANTIBODY: ENA SM Ab Ser-aCnc: <1 AI

## 2024-05-02 LAB — SJOGRENS SYNDROME-B EXTRACTABLE NUCLEAR ANTIBODY: SSB (La) (ENA) Antibody, IgG: <1 AI

## 2024-05-02 LAB — BETA-2 GLYCOPROTEIN ANTIBODIES
Beta-2 Glyco 1 IgA: <2 U/mL (ref <20.0)
Beta-2 Glyco 1 IgM: 3.5 U/mL (ref <20.0)
Beta-2 Glyco I IgG: 2.4 U/mL (ref <20.0)

## 2024-05-02 LAB — RNP ANTIBODY: Ribonucleic Protein(ENA) Antibody, IgG: <1 AI

## 2024-05-02 LAB — C3 AND C4
C3 Complement: 129 mg/dL (ref 83–193)
C4 Complement: 10 mg/dL — ABNORMAL LOW (ref 15–57)

## 2024-05-02 LAB — ANTI-DNA ANTIBODY, DOUBLE-STRANDED: ds DNA Ab: 16 [IU]/mL — ABNORMAL HIGH

## 2024-05-02 LAB — SEDIMENTATION RATE: Sed Rate: 56 mm/h — ABNORMAL HIGH (ref 0–30)

## 2024-05-02 LAB — CARDIOLIPIN ANTIBODIES, IGG, IGM, IGA
Anticardiolipin IgA: <2 [APL'U]/mL (ref <20.0)
Anticardiolipin IgG: 2 [GPL'U]/mL (ref <20.0)
Anticardiolipin IgM: 2.3 [MPL'U]/mL (ref <20.0)

## 2024-05-02 LAB — SJOGRENS SYNDROME-A EXTRACTABLE NUCLEAR ANTIBODY: SSA (Ro) (ENA) Antibody, IgG: <1 AI

## 2024-05-03 ENCOUNTER — Other Ambulatory Visit: Payer: Self-pay | Admitting: Medical

## 2024-05-05 ENCOUNTER — Ambulatory Visit: Payer: Self-pay | Admitting: Medical Oncology

## 2024-05-05 NOTE — Telephone Encounter (Signed)
-----   Message from Lauraine CHRISTELLA Dais sent at 05/05/2024 11:16 AM EDT ----- Iron saturation is good. Ferritin appears falsely elevated due to her other chronic conditions B12 is still low. Lets have her double up her B12 supplementation to 4,000 mcg daily  ----- Message ----- From: Interface, Lab In [place] Sent: 05/01/2024   1:39 PM EDT To: Lauraine CHRISTELLA Dais, PA-C

## 2024-05-05 NOTE — Telephone Encounter (Signed)
 As noted below by Lauraine Dais, PA, the iron saturation is good. Ferritin appears falsely elevated due to her other chronic conditions. B12 is still low. Lets have her double up her B12 supplementation to 4,000 mcg daily. If you have any questions or concerns please contact the office.

## 2024-05-13 ENCOUNTER — Inpatient Hospital Stay

## 2024-05-13 ENCOUNTER — Ambulatory Visit: Admitting: Medical Oncology

## 2024-05-13 ENCOUNTER — Ambulatory Visit

## 2024-05-29 ENCOUNTER — Other Ambulatory Visit: Payer: Self-pay | Admitting: Family Medicine

## 2024-05-31 ENCOUNTER — Other Ambulatory Visit: Payer: Self-pay | Admitting: Medical

## 2024-06-05 ENCOUNTER — Ambulatory Visit (INDEPENDENT_AMBULATORY_CARE_PROVIDER_SITE_OTHER): Admitting: Medical

## 2024-06-05 VITALS — BP 110/54 | HR 68 | Resp 15 | Ht 61.0 in | Wt 105.6 lb

## 2024-06-05 DIAGNOSIS — F17200 Nicotine dependence, unspecified, uncomplicated: Secondary | ICD-10-CM

## 2024-06-05 DIAGNOSIS — E538 Deficiency of other specified B group vitamins: Secondary | ICD-10-CM | POA: Diagnosis not present

## 2024-06-05 DIAGNOSIS — R944 Abnormal results of kidney function studies: Secondary | ICD-10-CM

## 2024-06-05 DIAGNOSIS — D649 Anemia, unspecified: Secondary | ICD-10-CM | POA: Diagnosis not present

## 2024-06-05 DIAGNOSIS — Z122 Encounter for screening for malignant neoplasm of respiratory organs: Secondary | ICD-10-CM | POA: Diagnosis not present

## 2024-06-05 DIAGNOSIS — R5383 Other fatigue: Secondary | ICD-10-CM

## 2024-06-05 DIAGNOSIS — M329 Systemic lupus erythematosus, unspecified: Secondary | ICD-10-CM

## 2024-06-05 NOTE — Patient Instructions (Signed)
 Systemic lupus erythematosus with chronic pain and fatigue Chronic pain and fatigue likely related to lupus. Previous rheumatologist labs done and follow up missed? Fatigue may be multifactorial, including lupus. - Contact rheumatologist to discuss potential hydroxychloroquine trial. - Consider hydroxychloroquine if active inflammation confirmed. - Continue gabapentin  for neuropathic pain management.  Chronic kidney disease with mild anemia secondary to chronic disease Mild anemia likely secondary to chronic kidney disease. Ferritin levels appear falsely elevated due to chronic conditions. Erythropoietin  therapy initiated. - Continue erythropoietin  therapy as prescribed. - Monitor kidney function and anemia status.  Depressive disorder Depression may contribute to fatigue and overall quality of life. - Continue venlafaxine  75 mg as prescribed. - Monitor mood and energy levels.  B12 lower end range normal -continue b12 4000 mcg daily.  Weakness and gait disturbance Weakness and gait disturbance likely related to deconditioning and chronic illness. - Refer to physical therapy for gait disturbance and weakness. - Specify physical therapy location in [place] if available.  Referral to pulmonologist. -CT lung cancer screening.  Number to Dr Jeannetta office. [phone](- Consider hydroxychloroquine trial if active inflammation confirmed.) - Consider hydroxychloroquine trial if active inflammation confirmed per Dr. Jeannetta note)  Follow up 3-4 weeks with me or sooner if needed
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
## 2024-06-06 ENCOUNTER — Telehealth: Payer: Self-pay | Admitting: Medical

## 2024-06-06 NOTE — Telephone Encounter (Signed)
 Called pts husband walter and notified him that no prescription was made yesterday on his wife's behalf He is understanding

## 2024-06-06 NOTE — Telephone Encounter (Unsigned)
 Copied from CRM #[id number]. Topic: Clinical - Medication Question >> Jun 06, 2024  1:43 PM Gennette ORN wrote: Reason for CRM: Patient's husband Ryan [phone] stated the prescription was suppose to be sent to post office yesterday and he stated he doesn't know the name. He wants a callback.

## 2024-06-09 ENCOUNTER — Other Ambulatory Visit: Payer: Self-pay

## 2024-06-09 ENCOUNTER — Emergency Department (HOSPITAL_BASED_OUTPATIENT_CLINIC_OR_DEPARTMENT_OTHER)
Admission: EM | Admit: 2024-06-09 | Discharge: 2024-06-09 | Discharge status: Against Medical Advice | Attending: Emergency Medicine | Admitting: Emergency Medicine

## 2024-06-09 ENCOUNTER — Encounter (HOSPITAL_BASED_OUTPATIENT_CLINIC_OR_DEPARTMENT_OTHER): Payer: Self-pay | Admitting: Emergency Medicine

## 2024-06-09 DIAGNOSIS — J029 Acute pharyngitis, unspecified: Secondary | ICD-10-CM | POA: Diagnosis not present

## 2024-06-09 DIAGNOSIS — R059 Cough, unspecified: Secondary | ICD-10-CM | POA: Diagnosis present

## 2024-06-09 DIAGNOSIS — Z5321 Procedure and treatment not carried out due to patient leaving prior to being seen by health care provider: Secondary | ICD-10-CM | POA: Insufficient documentation

## 2024-06-09 DIAGNOSIS — E1165 Type 2 diabetes mellitus with hyperglycemia: Secondary | ICD-10-CM | POA: Insufficient documentation

## 2024-06-09 DIAGNOSIS — E119 Type 2 diabetes mellitus without complications: Secondary | ICD-10-CM | POA: Diagnosis not present

## 2024-06-09 LAB — BASIC METABOLIC PANEL WITH GFR
Anion gap: 13 (ref 5–15)
BUN: 25 mg/dL — ABNORMAL HIGH (ref 8–23)
CO2: 19 mmol/L — ABNORMAL LOW (ref 22–32)
Calcium: 9.3 mg/dL (ref 8.9–10.3)
Chloride: 100 mmol/L (ref 98–111)
Creatinine, Ser: 1.59 mg/dL — ABNORMAL HIGH (ref 0.44–1.00)
GFR, Estimated: 34 mL/min — ABNORMAL LOW (ref >60)
Glucose, Bld: 203 mg/dL — ABNORMAL HIGH (ref 70–99)
Potassium: 4.6 mmol/L (ref 3.5–5.1)
Sodium: 132 mmol/L — ABNORMAL LOW (ref 135–145)

## 2024-06-09 LAB — RESP PANEL BY RT-PCR (RSV, FLU A&B, COVID)  RVPGX2
Influenza A by PCR: NEGATIVE
Influenza B by PCR: NEGATIVE
Resp Syncytial Virus by PCR: NEGATIVE
SARS Coronavirus 2 by RT PCR: NEGATIVE

## 2024-06-09 LAB — CBC
HCT: 35.3 % — ABNORMAL LOW (ref 36.0–46.0)
Hemoglobin: 11.7 g/dL — ABNORMAL LOW (ref 12.0–15.0)
MCH: 27.5 pg (ref 26.0–34.0)
MCHC: 33.1 g/dL (ref 30.0–36.0)
MCV: 83.1 fL (ref 80.0–100.0)
Platelets: 263 K/uL (ref 150–400)
RBC: 4.25 MIL/uL (ref 3.87–5.11)
RDW: 13.3 % (ref 11.5–15.5)
WBC: 6 K/uL (ref 4.0–10.5)
nRBC: 0 % (ref 0.0–0.2)

## 2024-06-09 LAB — GROUP A STREP BY PCR: Group A Strep by PCR: NOT DETECTED

## 2024-06-09 LAB — CBG MONITORING, ED: Glucose-Capillary: 209 mg/dL — ABNORMAL HIGH (ref 70–99)

## 2024-06-09 NOTE — ED Triage Notes (Signed)
 Pt has hx medical hx including DM, reports recent flulike symptoms including sore throat, congestion, cough, denies fever that started Saturday  Pt also concerned that blood sugar may be too high

## 2024-06-09 NOTE — ED Notes (Signed)
 Pt called for room assignment. No answer in lobby. Per registration she has previously left

## 2024-06-10 ENCOUNTER — Encounter: Payer: Self-pay | Admitting: Family Medicine

## 2024-06-10 ENCOUNTER — Ambulatory Visit (HOSPITAL_BASED_OUTPATIENT_CLINIC_OR_DEPARTMENT_OTHER)
Admission: RE | Admit: 2024-06-10 | Discharge: 2024-06-10 | Disposition: A | Discharge status: Home / Self Care | Source: Ambulatory Visit | Attending: Family Medicine | Admitting: Family Medicine

## 2024-06-10 ENCOUNTER — Ambulatory Visit (INDEPENDENT_AMBULATORY_CARE_PROVIDER_SITE_OTHER): Admitting: Family Medicine

## 2024-06-10 VITALS — BP 110/60 | HR 67 | Temp 97.8°F | Resp 18 | Ht 61.0 in | Wt 103.4 lb

## 2024-06-10 DIAGNOSIS — R051 Acute cough: Secondary | ICD-10-CM | POA: Diagnosis present

## 2024-06-10 DIAGNOSIS — J029 Acute pharyngitis, unspecified: Secondary | ICD-10-CM | POA: Insufficient documentation

## 2024-06-10 DIAGNOSIS — R519 Headache, unspecified: Secondary | ICD-10-CM | POA: Diagnosis not present

## 2024-06-10 DIAGNOSIS — G8929 Other chronic pain: Secondary | ICD-10-CM

## 2024-06-10 MED ORDER — CEFTRIAXONE SODIUM 1 G IJ SOLR
1.0000 g | Freq: Once | INTRAMUSCULAR | Status: AC
Start: 2024-06-10 — End: 2024-06-10
  Administered 2024-06-10: 1 g via INTRAMUSCULAR

## 2024-06-10 MED ORDER — AMOXICILLIN-POT CLAVULANATE 875-125 MG PO TABS: 1.0000 | ORAL_TABLET | Freq: Two times a day (BID) | ORAL | 0 refills | Status: DC

## 2024-06-10 MED ORDER — PROMETHAZINE-DM 6.25-15 MG/5ML PO SYRP: 5.0000 mL | ORAL_SOLUTION | Freq: Four times a day (QID) | ORAL | 0 refills | Status: DC | PRN

## 2024-06-10 NOTE — Patient Instructions (Signed)
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
## 2024-06-11 ENCOUNTER — Telehealth: Payer: Self-pay

## 2024-06-11 NOTE — Telephone Encounter (Signed)
 Left voicemail to call office back to see if referral was completed.   Curtistine Quiet, CMA

## 2024-06-16 ENCOUNTER — Telehealth: Payer: Self-pay | Admitting: Medical

## 2024-06-16 NOTE — Addendum Note (Signed)
 Addended by: TRUDY CURVIN RAMAN on: 06/16/2024 02:08 PM   Modules accepted: Orders

## 2024-06-16 NOTE — Telephone Encounter (Signed)
 I left a voicemail with the pt instructing her to schedule a lab appointment to get her blood work missed on 8/5.

## 2024-06-18 ENCOUNTER — Ambulatory Visit: Payer: Self-pay | Admitting: Family Medicine

## 2024-06-18 NOTE — Telephone Encounter (Signed)
 Copied from CRM #[id number]. Topic: General - Call Back - No Documentation >> Jun 18, 2024  1:16 PM Robinson H wrote: Reason for CRM: Patients husband states they couldn't understand message left on machine, agent not seeing any notes or messages at time patiens husband called. If office gets the machine to please call back since having issues with machine  Ryan [phone]

## 2024-06-26 ENCOUNTER — Ambulatory Visit: Payer: Self-pay | Admitting: Medical

## 2024-06-26 ENCOUNTER — Encounter: Payer: Self-pay | Admitting: Medical

## 2024-06-26 ENCOUNTER — Ambulatory Visit (INDEPENDENT_AMBULATORY_CARE_PROVIDER_SITE_OTHER): Admitting: Medical

## 2024-06-26 VITALS — BP 102/60 | HR 71 | Temp 97.7°F | Resp 15 | Ht 61.0 in | Wt 104.2 lb

## 2024-06-26 DIAGNOSIS — Z87891 Personal history of nicotine dependence: Secondary | ICD-10-CM | POA: Diagnosis not present

## 2024-06-26 DIAGNOSIS — M329 Systemic lupus erythematosus, unspecified: Secondary | ICD-10-CM

## 2024-06-26 DIAGNOSIS — E1122 Type 2 diabetes mellitus with diabetic chronic kidney disease: Secondary | ICD-10-CM

## 2024-06-26 DIAGNOSIS — N189 Chronic kidney disease, unspecified: Secondary | ICD-10-CM | POA: Diagnosis not present

## 2024-06-26 DIAGNOSIS — R944 Abnormal results of kidney function studies: Secondary | ICD-10-CM

## 2024-06-26 DIAGNOSIS — R634 Abnormal weight loss: Secondary | ICD-10-CM | POA: Diagnosis not present

## 2024-06-26 DIAGNOSIS — E088 Diabetes mellitus due to underlying condition with unspecified complications: Secondary | ICD-10-CM

## 2024-06-26 DIAGNOSIS — Z794 Long term (current) use of insulin: Secondary | ICD-10-CM

## 2024-06-26 DIAGNOSIS — M2550 Pain in unspecified joint: Secondary | ICD-10-CM

## 2024-06-26 DIAGNOSIS — Z122 Encounter for screening for malignant neoplasm of respiratory organs: Secondary | ICD-10-CM | POA: Diagnosis not present

## 2024-06-26 DIAGNOSIS — R42 Dizziness and giddiness: Secondary | ICD-10-CM

## 2024-06-26 DIAGNOSIS — R5383 Other fatigue: Secondary | ICD-10-CM

## 2024-06-26 DIAGNOSIS — E1165 Type 2 diabetes mellitus with hyperglycemia: Secondary | ICD-10-CM

## 2024-06-26 LAB — GLUCOSE, POCT (MANUAL RESULT ENTRY): POC Glucose: 55 mg/dL — AB (ref 70–99)

## 2024-06-26 MED ORDER — MECLIZINE HCL 12.5 MG PO TABS: 12.5000 mg | ORAL_TABLET | Freq: Three times a day (TID) | ORAL | 0 refills | Status: AC | PRN
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
## 2024-06-26 NOTE — Patient Instructions (Addendum)
  Fatigue and unintentional weight loss Fatigue and weight loss likely due to  recent pharyngitis and decreased intake. Differential , lupus, lower end b12, CKD, and anemia. (we discussed ct chest and abdomen/pelvis CT for caution sake evaluate for cancers).  More likely fatigue multifactorial from combination age and chronic med conditions. - Order CT chest for lung cancer screening. - Order CT abdomen and pelvis. - Encourage oral intake and monitor weight.  Systemic lupus erythematosus SLE contributing to fatigue. Awaiting rheumatology input for possible hydroxychloroquine  initiation. - Follow up with rheumatology appointment.  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 -note the former dx of diabetes mellitus due to underlying condition with unspecified complications was incorrect so changed dx) - Continue erythropoietin  therapy. Fasting glucose stable at 100-110 mg/dL indicates recent better control. (last A1c 7.9 and known ckd) - Continue 40 units of insulin . - Monitor fasting glucose levels and adjust insulin  dose if needed. - Follow up with nephrology appointment.(Advised keeping bp under control, sugars controlled and adequate hydration will help preserve kidney function)  Vitamin B12 deficiency Vitamin B12 deficiency contributing to fatigue. Supplementation ongoing as per hematology advice. - Continue vitamin B12 supplementation.  Depression Depression managed with venlafaxine . Persistent fatigue likely multifactorial. - Continue venlafaxine  75 mg. - Consider increasing venlafaxine  dose in the future if needed.  Dizziness and intermittent vertigo Dizziness and vertigo more frequent. Meclizine  provides partial relief. - Refill meclizine . - Refer to physical therapy for head maneuver exercises.  Short-term memory impairment with chronic small vessel ischemic disease  and cerebral atrophy Memory impairment worsening. MRI shows small vessel ischemic disease and atrophy. Smoking may contribute. - Follow up with neurology appointment.     Follow up date to be determined after lab review.

## 2024-06-28 LAB — LAB REPORT - SCANNED
Albumin, Urine POC: 50.2
Albumin/Creatinine Ratio, Urine, POC: 52
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
## 2024-06-30 ENCOUNTER — Ambulatory Visit: Attending: Internal Medicine | Admitting: Internal Medicine

## 2024-06-30 ENCOUNTER — Encounter: Payer: Self-pay | Admitting: Internal Medicine

## 2024-06-30 VITALS — BP 107/53 | HR 66 | Resp 17 | Ht 61.0 in | Wt 106.2 lb

## 2024-06-30 DIAGNOSIS — M797 Fibromyalgia: Secondary | ICD-10-CM | POA: Insufficient documentation

## 2024-06-30 DIAGNOSIS — M329 Systemic lupus erythematosus, unspecified: Secondary | ICD-10-CM | POA: Diagnosis present

## 2024-06-30 MED ORDER — HYDROXYCHLOROQUINE SULFATE 200 MG PO TABS: 200.0000 mg | ORAL_TABLET | Freq: Every day | ORAL | 2 refills | Status: DC

## 2024-06-30 MED ORDER — CYCLOBENZAPRINE HCL 5 MG PO TABS
5.0000 mg | ORAL_TABLET | Freq: Every day | ORAL | 1 refills | Status: DC
Start: 2024-06-30 — End: 2024-07-18

## 2024-07-02 ENCOUNTER — Inpatient Hospital Stay (HOSPITAL_COMMUNITY)
Admission: EM | Admit: 2024-07-02 | Discharge: 2024-07-09 | DRG: 208 | Disposition: A | Discharge status: Home Health | Attending: Internal Medicine | Admitting: Internal Medicine

## 2024-07-02 ENCOUNTER — Encounter (HOSPITAL_COMMUNITY): Payer: Self-pay | Admitting: Hospitalist

## 2024-07-02 ENCOUNTER — Emergency Department (HOSPITAL_COMMUNITY)

## 2024-07-02 ENCOUNTER — Other Ambulatory Visit: Payer: Self-pay

## 2024-07-02 DIAGNOSIS — E1165 Type 2 diabetes mellitus with hyperglycemia: Secondary | ICD-10-CM | POA: Diagnosis present

## 2024-07-02 DIAGNOSIS — T783XXA Angioneurotic edema, initial encounter: Secondary | ICD-10-CM | POA: Diagnosis present

## 2024-07-02 DIAGNOSIS — Z781 Physical restraint status: Secondary | ICD-10-CM

## 2024-07-02 DIAGNOSIS — I2489 Other forms of acute ischemic heart disease: Secondary | ICD-10-CM | POA: Diagnosis present

## 2024-07-02 DIAGNOSIS — T361X5A Adverse effect of cephalosporins and other beta-lactam antibiotics, initial encounter: Secondary | ICD-10-CM | POA: Diagnosis present

## 2024-07-02 DIAGNOSIS — N184 Chronic kidney disease, stage 4 (severe): Secondary | ICD-10-CM | POA: Diagnosis present

## 2024-07-02 DIAGNOSIS — Z801 Family history of malignant neoplasm of trachea, bronchus and lung: Secondary | ICD-10-CM

## 2024-07-02 DIAGNOSIS — K648 Other hemorrhoids: Secondary | ICD-10-CM | POA: Diagnosis present

## 2024-07-02 DIAGNOSIS — Z888 Allergy status to other drugs, medicaments and biological substances status: Secondary | ICD-10-CM

## 2024-07-02 DIAGNOSIS — E11649 Type 2 diabetes mellitus with hypoglycemia without coma: Secondary | ICD-10-CM | POA: Diagnosis present

## 2024-07-02 DIAGNOSIS — I82611 Acute embolism and thrombosis of superficial veins of right upper extremity: Secondary | ICD-10-CM | POA: Diagnosis present

## 2024-07-02 DIAGNOSIS — Z794 Long term (current) use of insulin: Secondary | ICD-10-CM

## 2024-07-02 DIAGNOSIS — I129 Hypertensive chronic kidney disease with stage 1 through stage 4 chronic kidney disease, or unspecified chronic kidney disease: Secondary | ICD-10-CM | POA: Diagnosis present

## 2024-07-02 DIAGNOSIS — J9601 Acute respiratory failure with hypoxia: Secondary | ICD-10-CM | POA: Diagnosis not present

## 2024-07-02 DIAGNOSIS — E43 Unspecified severe protein-calorie malnutrition: Secondary | ICD-10-CM | POA: Diagnosis present

## 2024-07-02 DIAGNOSIS — M329 Systemic lupus erythematosus, unspecified: Secondary | ICD-10-CM | POA: Diagnosis present

## 2024-07-02 DIAGNOSIS — I2510 Atherosclerotic heart disease of native coronary artery without angina pectoris: Secondary | ICD-10-CM | POA: Diagnosis present

## 2024-07-02 DIAGNOSIS — E039 Hypothyroidism, unspecified: Secondary | ICD-10-CM | POA: Diagnosis present

## 2024-07-02 DIAGNOSIS — Z681 Body mass index (BMI) 19 or less, adult: Secondary | ICD-10-CM

## 2024-07-02 DIAGNOSIS — R34 Anuria and oliguria: Secondary | ICD-10-CM | POA: Diagnosis present

## 2024-07-02 DIAGNOSIS — K625 Hemorrhage of anus and rectum: Secondary | ICD-10-CM | POA: Diagnosis not present

## 2024-07-02 DIAGNOSIS — J392 Other diseases of pharynx: Secondary | ICD-10-CM | POA: Diagnosis present

## 2024-07-02 DIAGNOSIS — Y92239 Unspecified place in hospital as the place of occurrence of the external cause: Secondary | ICD-10-CM | POA: Diagnosis present

## 2024-07-02 DIAGNOSIS — M797 Fibromyalgia: Secondary | ICD-10-CM | POA: Diagnosis present

## 2024-07-02 DIAGNOSIS — Z8679 Personal history of other diseases of the circulatory system: Secondary | ICD-10-CM

## 2024-07-02 DIAGNOSIS — L539 Erythematous condition, unspecified: Secondary | ICD-10-CM | POA: Diagnosis not present

## 2024-07-02 DIAGNOSIS — K5732 Diverticulitis of large intestine without perforation or abscess without bleeding: Secondary | ICD-10-CM | POA: Diagnosis present

## 2024-07-02 DIAGNOSIS — K5792 Diverticulitis of intestine, part unspecified, without perforation or abscess without bleeding: Principal | ICD-10-CM | POA: Diagnosis present

## 2024-07-02 DIAGNOSIS — I714 Abdominal aortic aneurysm, without rupture, unspecified: Secondary | ICD-10-CM | POA: Diagnosis present

## 2024-07-02 DIAGNOSIS — Z723 Lack of physical exercise: Secondary | ICD-10-CM

## 2024-07-02 DIAGNOSIS — R339 Retention of urine, unspecified: Secondary | ICD-10-CM | POA: Diagnosis present

## 2024-07-02 DIAGNOSIS — G9349 Other encephalopathy: Secondary | ICD-10-CM | POA: Diagnosis present

## 2024-07-02 DIAGNOSIS — R578 Other shock: Secondary | ICD-10-CM | POA: Diagnosis present

## 2024-07-02 DIAGNOSIS — Z7983 Long term (current) use of bisphosphonates: Secondary | ICD-10-CM

## 2024-07-02 DIAGNOSIS — Z833 Family history of diabetes mellitus: Secondary | ICD-10-CM

## 2024-07-02 DIAGNOSIS — E785 Hyperlipidemia, unspecified: Secondary | ICD-10-CM | POA: Diagnosis present

## 2024-07-02 DIAGNOSIS — E440 Moderate protein-calorie malnutrition: Secondary | ICD-10-CM | POA: Insufficient documentation

## 2024-07-02 DIAGNOSIS — E872 Acidosis, unspecified: Secondary | ICD-10-CM | POA: Diagnosis present

## 2024-07-02 DIAGNOSIS — E871 Hypo-osmolality and hyponatremia: Secondary | ICD-10-CM | POA: Diagnosis present

## 2024-07-02 DIAGNOSIS — Z87891 Personal history of nicotine dependence: Secondary | ICD-10-CM

## 2024-07-02 DIAGNOSIS — D631 Anemia in chronic kidney disease: Secondary | ICD-10-CM | POA: Diagnosis present

## 2024-07-02 DIAGNOSIS — Z79890 Hormone replacement therapy (postmenopausal): Secondary | ICD-10-CM

## 2024-07-02 DIAGNOSIS — Z604 Social exclusion and rejection: Secondary | ICD-10-CM | POA: Diagnosis present

## 2024-07-02 DIAGNOSIS — M810 Age-related osteoporosis without current pathological fracture: Secondary | ICD-10-CM | POA: Diagnosis present

## 2024-07-02 DIAGNOSIS — Z955 Presence of coronary angioplasty implant and graft: Secondary | ICD-10-CM

## 2024-07-02 DIAGNOSIS — Z951 Presence of aortocoronary bypass graft: Secondary | ICD-10-CM

## 2024-07-02 DIAGNOSIS — Z841 Family history of disorders of kidney and ureter: Secondary | ICD-10-CM

## 2024-07-02 DIAGNOSIS — K219 Gastro-esophageal reflux disease without esophagitis: Secondary | ICD-10-CM | POA: Diagnosis present

## 2024-07-02 DIAGNOSIS — N170 Acute kidney failure with tubular necrosis: Secondary | ICD-10-CM | POA: Diagnosis present

## 2024-07-02 DIAGNOSIS — T360X5A Adverse effect of penicillins, initial encounter: Secondary | ICD-10-CM | POA: Diagnosis present

## 2024-07-02 DIAGNOSIS — Z79899 Other long term (current) drug therapy: Secondary | ICD-10-CM

## 2024-07-02 LAB — COMPREHENSIVE METABOLIC PANEL WITH GFR
ALT: 9 U/L (ref 0–44)
AST: 24 U/L (ref 15–41)
Albumin: 2.8 g/dL — ABNORMAL LOW (ref 3.5–5.0)
Alkaline Phosphatase: 37 U/L — ABNORMAL LOW (ref 38–126)
Anion gap: 10 (ref 5–15)
BUN: 22 mg/dL (ref 8–23)
CO2: 18 mmol/L — ABNORMAL LOW (ref 22–32)
Calcium: 8.2 mg/dL — ABNORMAL LOW (ref 8.9–10.3)
Chloride: 105 mmol/L (ref 98–111)
Creatinine, Ser: 1.58 mg/dL — ABNORMAL HIGH (ref 0.44–1.00)
GFR, Estimated: 35 mL/min — ABNORMAL LOW (ref >60)
Glucose, Bld: 60 mg/dL — ABNORMAL LOW (ref 70–99)
Potassium: 3.5 mmol/L (ref 3.5–5.1)
Sodium: 133 mmol/L — ABNORMAL LOW (ref 135–145)
Total Bilirubin: 0.8 mg/dL (ref 0.0–1.2)
Total Protein: 6.3 g/dL — ABNORMAL LOW (ref 6.5–8.1)

## 2024-07-02 LAB — CBC
HCT: 31.1 % — ABNORMAL LOW (ref 36.0–46.0)
Hemoglobin: 9.9 g/dL — ABNORMAL LOW (ref 12.0–15.0)
MCH: 28.7 pg (ref 26.0–34.0)
MCHC: 31.8 g/dL (ref 30.0–36.0)
MCV: 90.1 fL (ref 80.0–100.0)
Platelets: 227 K/uL (ref 150–400)
RBC: 3.45 MIL/uL — ABNORMAL LOW (ref 3.87–5.11)
RDW: 14.4 % (ref 11.5–15.5)
WBC: 5.3 K/uL (ref 4.0–10.5)
nRBC: 0 % (ref 0.0–0.2)

## 2024-07-02 LAB — CBG MONITORING, ED
Glucose-Capillary: 35 mg/dL — CL (ref 70–99)
Glucose-Capillary: 224 mg/dL — ABNORMAL HIGH (ref 70–99)
Glucose-Capillary: 140 mg/dL — ABNORMAL HIGH (ref 70–99)

## 2024-07-02 LAB — I-STAT CG4 LACTIC ACID, ED
Lactic Acid, Venous: 0.6 mmol/L (ref 0.5–1.9)
Lactic Acid, Venous: 1.1 mmol/L (ref 0.5–1.9)

## 2024-07-02 LAB — LIPASE, BLOOD: Lipase: 32 U/L (ref 11–51)

## 2024-07-02 LAB — TYPE AND SCREEN
ABO/RH(D): O POS
Antibody Screen: NEGATIVE

## 2024-07-02 MED ORDER — GABAPENTIN 300 MG PO CAPS
300.0000 mg | ORAL_CAPSULE | Freq: Two times a day (BID) | ORAL | Status: DC
  Administered 2024-07-02 – 2024-07-03 (×3): 300 mg via ORAL
  Filled 2024-07-02 (×3): qty 1

## 2024-07-02 MED ORDER — CARVEDILOL 3.125 MG PO TABS
6.2500 mg | ORAL_TABLET | Freq: Two times a day (BID) | ORAL | Status: DC
  Administered 2024-07-02 – 2024-07-03 (×3): 6.25 mg via ORAL
  Filled 2024-07-02 (×3): qty 1

## 2024-07-02 MED ORDER — ONDANSETRON HCL 4 MG/2ML IJ SOLN
4.0000 mg | Freq: Once | INTRAMUSCULAR | Status: AC
  Administered 2024-07-02: 4 mg via INTRAVENOUS
  Filled 2024-07-02: qty 2

## 2024-07-02 MED ORDER — DICYCLOMINE HCL 10 MG PO CAPS
10.0000 mg | ORAL_CAPSULE | Freq: Three times a day (TID) | ORAL | Status: DC
  Administered 2024-07-02 – 2024-07-03 (×5): 10 mg via ORAL
  Filled 2024-07-02 (×7): qty 1

## 2024-07-02 MED ORDER — IOHEXOL 350 MG/ML SOLN
75.0000 mL | Freq: Once | INTRAVENOUS | Status: AC | PRN
  Administered 2024-07-02: 75 mL via INTRAVENOUS

## 2024-07-02 MED ORDER — CYCLOBENZAPRINE HCL 5 MG PO TABS: 5.0000 mg | ORAL_TABLET | Freq: Every day | ORAL | Status: DC

## 2024-07-02 MED ORDER — HYDROXYCHLOROQUINE SULFATE 200 MG PO TABS
200.0000 mg | ORAL_TABLET | Freq: Every day | ORAL | Status: DC
  Administered 2024-07-02 – 2024-07-03 (×2): 200 mg via ORAL
  Filled 2024-07-02 (×3): qty 1

## 2024-07-02 MED ORDER — AMLODIPINE BESYLATE 5 MG PO TABS
5.0000 mg | ORAL_TABLET | Freq: Every day | ORAL | Status: DC
  Administered 2024-07-02 – 2024-07-03 (×2): 5 mg via ORAL
  Filled 2024-07-02 (×2): qty 1

## 2024-07-02 MED ORDER — LEVOTHYROXINE SODIUM 25 MCG PO TABS
25.0000 ug | ORAL_TABLET | Freq: Every day | ORAL | Status: DC
  Administered 2024-07-03: 25 ug via ORAL
  Filled 2024-07-02: qty 1

## 2024-07-02 MED ORDER — BUSPIRONE HCL 5 MG PO TABS
7.5000 mg | ORAL_TABLET | Freq: Two times a day (BID) | ORAL | Status: DC
  Filled 2024-07-02 (×2): qty 1

## 2024-07-02 MED ORDER — DEXTROSE 10 % IV BOLUS
250.0000 mL | Freq: Once | INTRAVENOUS | Status: AC
  Administered 2024-07-02: 250 mL via INTRAVENOUS

## 2024-07-02 MED ORDER — MORPHINE SULFATE (PF) 2 MG/ML IV SOLN
2.0000 mg | Freq: Once | INTRAVENOUS | Status: AC
  Administered 2024-07-02: 2 mg via INTRAVENOUS
  Filled 2024-07-02: qty 1

## 2024-07-02 MED ORDER — PANTOPRAZOLE SODIUM 40 MG PO TBEC
40.0000 mg | DELAYED_RELEASE_TABLET | Freq: Every day | ORAL | Status: DC
  Administered 2024-07-02 – 2024-07-03 (×2): 40 mg via ORAL
  Filled 2024-07-02 (×2): qty 1

## 2024-07-02 MED ORDER — LACTATED RINGERS IV BOLUS
1000.0000 mL | Freq: Once | INTRAVENOUS | Status: AC
  Administered 2024-07-02: 1000 mL via INTRAVENOUS

## 2024-07-02 MED ORDER — MORPHINE SULFATE (PF) 2 MG/ML IV SOLN
2.0000 mg | Freq: Once | INTRAVENOUS | Status: AC | PRN
  Administered 2024-07-02: 2 mg via INTRAVENOUS
  Filled 2024-07-02: qty 1

## 2024-07-02 MED ORDER — INSULIN GLARGINE 100 UNIT/ML ~~LOC~~ SOLN
20.0000 [IU] | Freq: Every day | SUBCUTANEOUS | Status: DC
  Administered 2024-07-02 – 2024-07-03 (×2): 20 [IU] via SUBCUTANEOUS
  Filled 2024-07-02 (×2): qty 0.2

## 2024-07-02 MED ORDER — METRONIDAZOLE 500 MG/100ML IV SOLN
500.0000 mg | Freq: Once | INTRAVENOUS | Status: AC
  Administered 2024-07-02: 500 mg via INTRAVENOUS
  Filled 2024-07-02: qty 100

## 2024-07-02 MED ORDER — VENLAFAXINE HCL ER 75 MG PO CP24
75.0000 mg | ORAL_CAPSULE | Freq: Every day | ORAL | Status: DC
  Administered 2024-07-03 – 2024-07-04 (×2): 75 mg via ORAL
  Filled 2024-07-02 (×2): qty 1

## 2024-07-02 MED ORDER — FENOFIBRATE 160 MG PO TABS
160.0000 mg | ORAL_TABLET | Freq: Every day | ORAL | Status: DC
  Filled 2024-07-02: qty 1

## 2024-07-02 MED ORDER — SODIUM CHLORIDE 0.9 % IV SOLN
2.0000 g | Freq: Once | INTRAVENOUS | Status: AC
  Administered 2024-07-02: 2 g via INTRAVENOUS
  Filled 2024-07-02: qty 20

## 2024-07-02 MED ORDER — ACETAMINOPHEN 325 MG PO TABS
650.0000 mg | ORAL_TABLET | Freq: Four times a day (QID) | ORAL | Status: DC | PRN
  Administered 2024-07-03 – 2024-07-06 (×4): 650 mg via ORAL
  Filled 2024-07-02 (×4): qty 2
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
## 2024-07-02 NOTE — ED Notes (Signed)
 Patient transported to CT

## 2024-07-02 NOTE — ED Provider Notes (Signed)
 [place] EMERGENCY DEPARTMENT AT [facility] Provider Note   CSN: [id number] Arrival date & time: 07/02/24  1024     Patient presents with: Abdominal Pain and Diarrhea   Jasmine Keith is a 73 y.o. female.   Is a 73 year old female presenting emergency department for lower abdominal pain with diarrhea.  Reports symptoms started yesterday morning lasted till today.  Dark diarrhea.  No nausea no vomiting.  Not on blood thinners.  Complains of sharp pain.  Last bowel movement this morning.  No prior surgical history.   Abdominal Pain Associated symptoms: diarrhea   Diarrhea Associated symptoms: abdominal pain        Prior to Admission medications   Medication Sig Start Date End Date Taking? Authorizing Provider  acetaminophen  (TYLENOL ) 325 MG tablet Take 2 tablets (650 mg total) by mouth every 6 (six) hours as needed for mild pain (or Fever >/= 101). 05/29/22   Swayze, Ava, DO  alendronate  (FOSAMAX ) 70 MG tablet TAKE 1 TABLET BY MOUTH ONCE A WEEK. TAKE WITH A FULL GLASS OF WATER ON AN EMPTY STOMACH. 06/14/23   Saguier, Dallas, PA-C  amLODipine  (NORVASC ) 5 MG tablet Take 5 mg by mouth daily.    [provider]  amoxicillin -clavulanate (AUGMENTIN ) 875-125 MG tablet Take 1 tablet by mouth 2 (two) times daily. Patient not taking: Reported on 06/30/2024 06/10/24   Antonio Meth, Jamee R, DO  busPIRone  (BUSPAR ) 7.5 MG tablet TAKE 1 TABLET BY MOUTH 2 TIMES DAILY. 04/17/24   Saguier, Dallas, PA-C  carvedilol  (COREG ) 6.25 MG tablet TAKE 1 TABLET BY MOUTH TWICE A DAY WITH FOOD 05/05/24   Saguier, Dallas, PA-C  Continuous Blood Gluc Receiver (FREESTYLE LIBRE 2 READER) DEVI USE AS DIRECTED 11/07/22   Saguier, Dallas, PA-C  Continuous Blood Gluc Sensor (FREESTYLE LIBRE 14 DAY SENSOR) MISC 1 kit by Does not apply route daily. 12/05/22   Saguier, Dallas, PA-C  cyclobenzaprine  (FLEXERIL ) 5 MG tablet Take 1 tablet (5 mg total) by mouth at bedtime. 06/30/24   Jeannetta Lonni ORN, MD   dicyclomine  (BENTYL ) 10 MG capsule TAKE 1 CAPSULE (10 MG TOTAL) BY MOUTH 4 TIMES A DAY BEFORE MEALS AND AT BEDTIME 05/05/24   Saguier, Dallas, PA-C  fenofibrate  160 MG tablet TAKE 1 TABLET BY MOUTH EVERY DAY 04/16/24   Saguier, Dallas, PA-C  fluticasone  (FLONASE ) 50 MCG/ACT nasal spray Place 2 sprays into both nostrils daily. 08/23/23   Saguier, Dallas, PA-C  gabapentin  (NEURONTIN ) 300 MG capsule Take 1 capsule (300 mg total) by mouth 2 (two) times daily. 05/29/24   Saguier, Dallas, PA-C  hydrALAZINE  (APRESOLINE ) 100 MG tablet Take 1 tablet (100 mg total) by mouth 3 (three) times daily. 03/28/24   Saguier, Dallas, PA-C  hydrALAZINE  (APRESOLINE ) 25 MG tablet 3 tabs(75 mg ) tid 06/11/23   Saguier, Dallas, PA-C  HYDROcodone -acetaminophen  (NORCO/VICODIN) 5-325 MG tablet Take 1 tablet by mouth every 8 (eight) hours as needed. 01/23/23   Chick Venetia BRAVO, MD  hydroxychloroquine  (PLAQUENIL ) 200 MG tablet Take 1 tablet (200 mg total) by mouth daily. 06/30/24   Rice, Lonni ORN, MD  icosapent  Ethyl (VASCEPA ) 1 g capsule Take 2 capsules (2 g total) by mouth 2 (two) times daily. 04/21/24   Revankar, Jennifer SAUNDERS, MD  inclisiran (LEQVIO ) 284 MG/1.5ML SOSY injection Inject 1.5 mLs (284 mg total) into the skin every 6 (six) months. 03/13/24   Revankar, Jennifer SAUNDERS, MD  Insulin  Glargine Solostar (LANTUS ) 100 UNIT/ML Solostar Pen Inject 30 Units into the skin daily. 11/14/23  Saguier, Dallas, PA-C  Insulin  Pen Needle (BD PEN NEEDLE NANO U/F) 32G X 4 MM MISC Use one needle daily to inject insulin  11/16/22   Saguier, Dallas, PA-C  Insulin  Pen Needle (PEN NEEDLES 3/16) 31G X 5 MM MISC Inject 8 Units into the skin daily. 09/07/23   Saguier, Dallas, PA-C  levothyroxine  (SYNTHROID ) 25 MCG tablet TAKE 1 TABLET BY MOUTH EVERY DAY BEFORE BREAKFAST 12/31/23   Saguier, Dallas, PA-C  Magnesium  250 MG TABS Take 4 tablets by mouth daily.    [provider]  meclizine  (ANTIVERT ) 12.5 MG tablet Take 1 tablet (12.5 mg total) by mouth 3  (three) times daily as needed for dizziness. 06/26/24   Saguier, Dallas, PA-C  pantoprazole  (PROTONIX ) 40 MG tablet TAKE 1 TABLET BY MOUTH EVERY DAY 04/16/24   Saguier, Dallas, PA-C  promethazine -dextromethorphan (PROMETHAZINE -DM) 6.25-15 MG/5ML syrup Take 5 mLs by mouth 4 (four) times daily as needed. 06/10/24   Antonio Cyndee Jamee JONELLE, DO  silver  sulfADIAZINE  (SILVADENE ) 1 % cream Apply 1 Application topically daily. 02/20/24   Saguier, Dallas, PA-C  valsartan  (DIOVAN ) 80 MG tablet Take 1 tablet (80 mg total) by mouth daily. 08/06/23   Revankar, Jennifer JONELLE, MD  venlafaxine  XR (EFFEXOR -XR) 75 MG 24 hr capsule Take 1 capsule (75 mg total) by mouth daily with breakfast. 06/02/24   Saguier, Dallas, PA-C  Vitamin D , Ergocalciferol , (DRISDOL ) 1.25 MG (50000 UNIT) CAPS capsule TAKE 1 CAPSULE BY MOUTH ONE TIME PER WEEK 11/20/22   Saguier, Dallas, PA-C    Allergies: Lipitor [atorvastatin ], Lovastatin, and Rosuvastatin    Review of Systems  Gastrointestinal:  Positive for abdominal pain and diarrhea.    Updated Vital Signs BP (!) 148/48 (BP Location: Right Arm)   Pulse 74   Temp (!) 97.4 F (36.3 C) (Oral)   Resp 17   Ht 5' 1 (1.549 m)   Wt 47.2 kg   SpO2 100%   BMI 19.65 kg/m   Physical Exam Vitals and nursing note reviewed.  Constitutional:      General: She is not in acute distress.    Appearance: She is not toxic-appearing.  HENT:     Head: Normocephalic.  Cardiovascular:     Rate and Rhythm: Normal rate and regular rhythm.  Pulmonary:     Effort: Pulmonary effort is normal.     Breath sounds: Normal breath sounds.  Abdominal:     General: Abdomen is flat.     Tenderness: There is abdominal tenderness in the left lower quadrant.  Skin:    Capillary Refill: Capillary refill takes less than 2 seconds.  Neurological:     Mental Status: She is alert and oriented to person, place, and time.  Psychiatric:        Mood and Affect: Mood normal.        Behavior: Behavior normal.     (all  labs ordered are listed, but only abnormal results are displayed) Labs Reviewed  CBC - Abnormal; Notable for the following components:      Result Value   RBC 3.45 (*)    Hemoglobin 9.9 (*)    HCT 31.1 (*)    All other components within normal limits  COMPREHENSIVE METABOLIC PANEL WITH GFR - Abnormal; Notable for the following components:   Sodium 133 (*)    CO2 18 (*)    Glucose, Bld 60 (*)    Creatinine, Ser 1.58 (*)    Calcium  8.2 (*)    Total Protein 6.3 (*)  Albumin  2.8 (*)    Alkaline Phosphatase 37 (*)    GFR, Estimated 35 (*)    All other components within normal limits  CBG MONITORING, ED - Abnormal; Notable for the following components:   Glucose-Capillary 35 (*)    All other components within normal limits  CBG MONITORING, ED - Abnormal; Notable for the following components:   Glucose-Capillary 224 (*)    All other components within normal limits  LIPASE, BLOOD  URINALYSIS, ROUTINE W REFLEX MICROSCOPIC  I-STAT CG4 LACTIC ACID, ED  I-STAT CG4 LACTIC ACID, ED  TYPE AND SCREEN    EKG: None  Radiology: CT ABDOMEN PELVIS W CONTRAST Result Date: 07/02/2024 CLINICAL DATA:  Lower abdominal pain, melena EXAM: CT ABDOMEN AND PELVIS WITH CONTRAST TECHNIQUE: Multidetector CT imaging of the abdomen and pelvis was performed using the standard protocol following bolus administration of intravenous contrast. RADIATION DOSE REDUCTION: This exam was performed according to the departmental dose-optimization program which includes automated exposure control, adjustment of the mA and/or kV according to patient size and/or use of iterative reconstruction technique. CONTRAST:  75mL OMNIPAQUE  IOHEXOL  350 MG/ML SOLN COMPARISON:  None Available. FINDINGS: Lower chest: Cardiomegaly. Status post. Dependent atelectasis in both lower lobes. No acute process. Hepatobiliary: Normal hepatic contour and morphology. No discrete hepatic lesions. Normal appearance of the gallbladder. No intra or  extrahepatic biliary ductal dilatation. Pancreas: Unremarkable. No pancreatic ductal dilatation or surrounding inflammatory changes. Spleen: Normal in size without focal abnormality. Adrenals/Urinary Tract: Normal adrenal glands. No hydronephrosis, nephrolithiasis or enhancing renal mass. 1.5 cm simple cyst in the interpolar left kidney. No imaging follow-up is recommended. Ureters and bladder are unremarkable. Stomach/Bowel: Diffuse submucosal edema of the rectum and sigmoid colon in a region with multiple small diverticula. Inflammatory stranding present adjacent to the involved sigmoid colon and rectum. Findings are most consistent with acute diverticulitis and proctitis. No evidence of free air or abscess. No obstruction. Vascular/Lymphatic: Extensive calcified atherosclerotic plaque throughout the abdominal aorta. Fusiform aneurysmal dilation of the infrarenal abdominal aorta with a maximal diameter of 2.8 cm. 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 Severe calcified plaque results in high-grade stenosis of the left common iliac artery. Probable occlusion of both hypogastric arteries. No suspicious lymphadenopathy. Reproductive: Uterus and bilateral adnexa are unremarkable. Other: No abdominal wall hernia or abnormality. No abdominopelvic ascites. Musculoskeletal: No acute fracture or aggressive appearing lytic or blastic osseous lesion. IMPRESSION: 1. CT findings are consistent with acute uncomplicated sigmoid diverticulitis with additional inflammatory changes extending to the upper rectum. No evidence of perforation or abscess. 2. Fusiform infrarenal abdominal aortic aneurysm with a maximal diameter of 2.8 cm. Recommend follow-up ultrasound every 5 years. (Ref.: J Vasc Surg. 2018; 67:2-77 and J Am Coll Radiol 2013;10(10):789-794.) 3. 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 4.  Cardiomegaly. 5. Additional ancillary findings as above. Electronically Signed   By: Wilkie Lent M.D.   On: 07/02/2024 13:26     Procedures   Medications Ordered in the ED  morphine  (PF) 2 MG/ML injection 2 mg (has no administration in time range)  cefTRIAXone  (ROCEPHIN ) 2 g in sodium chloride  0.9 % 100 mL IVPB (has no administration in time range)    And  metroNIDAZOLE  (FLAGYL ) IVPB 500 mg (has no administration in time range)  acetaminophen  (TYLENOL ) tablet 650 mg (has no administration in time range)  amLODipine  (NORVASC ) tablet 5 mg (has no administration in time range)  busPIRone  (  BUSPAR ) tablet 7.5 mg (has no administration in time range)  carvedilol  (COREG ) tablet 6.25 mg (has no administration in time range)  cyclobenzaprine  (FLEXERIL ) tablet 5 mg (has no administration in time range)  dicyclomine  (BENTYL ) capsule 10 mg (has no administration in time range)  fenofibrate  tablet 160 mg (has no administration in time range)  gabapentin  (NEURONTIN ) capsule 300 mg (has no administration in time range)  hydroxychloroquine  (PLAQUENIL ) tablet 200 mg (has no administration in time range)  Insulin  Glargine Solostar (LANTUS ) Solostar Pen 20 Units (has no administration in time range)  levothyroxine  (SYNTHROID ) tablet 25 mcg (has no administration in time range)  pantoprazole  (PROTONIX ) EC tablet 40 mg (has no administration in time range)  venlafaxine  XR (EFFEXOR -XR) 24 hr capsule 75 mg (has no administration in time range)  lactated ringers  bolus 1,000 mL (0 mLs Intravenous Stopped 07/02/24 1349)  ondansetron  (ZOFRAN ) injection 4 mg (4 mg Intravenous Given 07/02/24 1130)  morphine  (PF) 2 MG/ML injection 2 mg (2 mg Intravenous Given 07/02/24 1130)  dextrose  (D10W) 10% bolus 250 mL (0 mLs Intravenous Stopped 07/02/24 1435)  iohexol  (OMNIPAQUE ) 350 MG/ML injection 75 mL (75 mLs Intravenous Contrast Given 07/02/24 1222)    Clinical Course as of 07/02/24 1517  Wed Jul 02, 2024  1409  Glucose-Capillary(!!): 35 Receiving D10, and is also drinking juice.  Mentating well.   [TY]  1409 Workup today with minor hyponatremia, baseline renal function.  No transaminitis to suggest hepatobiliary disease.  No leukocytosis, hemoglobin stable at 9.9.  Lactate is normal.  CT scan with diverticulitis.  Patient still having abdominal pain.  Will repeat dose of morphine .  Does have numerous comorbidities and advanced age.  Will start antibiotics and plan for admission for patient's diverticulitis. [TY]    Clinical Course User Index [TY] Neysa Caron PARAS, DO                                 Medical Decision Making 73 year old female presenting emergency department for abdominal pain.  She is afebrile nontachycardic, slightly hypertensive.  Maintaining oxygen saturation on room air.  Tender to palpation left lower quadrant on exam, but soft abdomen broad workup revealed diverticulitis.  No leukocytosis or fever or tachycardia to suggest stomach infection.  Stable anemia.  Had a hypoglycemic episode here in the emergency department, but remained asymptomatic throughout.  Received D10 and is eating and tolerating p.o.  Was having severe pain was given morphine , IV fluids.  Started on antibiotics.,  However still having pain.  Has numerous comorbidities to include advanced age, fibromyalgia, diabetes, GERD, hypertension, hypothyroid, AAA, lupus.  Given still having pain with comorbidities that predisposes her to high propensity for decompensation and acutely will admit for IV antibiotics.  Amount and/or Complexity of Data Reviewed Independent Historian:     Details: Has been noted dark diarrhea External Data Reviewed:     Details: Not on blood thinner Labs: ordered. Decision-making details documented in ED Course. Radiology: ordered and independent interpretation performed.    Details: Do not appreciate obvious free air ECG/medicine tests: ordered.  Risk Prescription drug management. Parenteral  controlled substances. Decision regarding hospitalization. Diagnosis or treatment significantly limited by social determinants of health. Risk Details: Poor health literacy      Final diagnoses:  Diverticulitis    ED Discharge Orders     None          Neysa Caron PARAS, DO 07/02/24 1517

## 2024-07-02 NOTE — ED Triage Notes (Signed)
 Pt bib [place] [facility] from [facility] for abd pain and black diarrhea for last 24hr . Reports no n/v. Pt is not on thinners.   5/10 sharp bilateral lower abd pain, no distention or masses noted.  HX: lupus, AAA  EMSBS  148/84 74 98% RA  CBG 78

## 2024-07-03 ENCOUNTER — Encounter (HOSPITAL_COMMUNITY): Payer: Self-pay | Admitting: Hospitalist

## 2024-07-03 ENCOUNTER — Observation Stay (HOSPITAL_COMMUNITY)

## 2024-07-03 ENCOUNTER — Other Ambulatory Visit: Payer: Self-pay | Admitting: Cardiology

## 2024-07-03 DIAGNOSIS — Z794 Long term (current) use of insulin: Secondary | ICD-10-CM

## 2024-07-03 DIAGNOSIS — E11649 Type 2 diabetes mellitus with hypoglycemia without coma: Secondary | ICD-10-CM | POA: Diagnosis not present

## 2024-07-03 DIAGNOSIS — J9601 Acute respiratory failure with hypoxia: Secondary | ICD-10-CM | POA: Diagnosis not present

## 2024-07-03 DIAGNOSIS — K5792 Diverticulitis of intestine, part unspecified, without perforation or abscess without bleeding: Secondary | ICD-10-CM | POA: Diagnosis not present

## 2024-07-03 DIAGNOSIS — J9690 Respiratory failure, unspecified, unspecified whether with hypoxia or hypercapnia: Secondary | ICD-10-CM | POA: Diagnosis not present

## 2024-07-03 DIAGNOSIS — R9431 Abnormal electrocardiogram [ECG] [EKG]: Secondary | ICD-10-CM

## 2024-07-03 DIAGNOSIS — I2510 Atherosclerotic heart disease of native coronary artery without angina pectoris: Secondary | ICD-10-CM

## 2024-07-03 DIAGNOSIS — T783XXA Angioneurotic edema, initial encounter: Secondary | ICD-10-CM | POA: Diagnosis not present

## 2024-07-03 LAB — CBC WITH DIFFERENTIAL/PLATELET
Abs Immature Granulocytes: 0.02 K/uL (ref 0.00–0.07)
Basophils Absolute: 0 K/uL (ref 0.0–0.1)
Basophils Relative: 1 %
Eosinophils Absolute: 0 K/uL (ref 0.0–0.5)
Eosinophils Relative: 1 %
HCT: 34.7 % — ABNORMAL LOW (ref 36.0–46.0)
Hemoglobin: 11.6 g/dL — ABNORMAL LOW (ref 12.0–15.0)
Immature Granulocytes: 1 %
Lymphocytes Relative: 6 %
Lymphs Abs: 0.2 K/uL — ABNORMAL LOW (ref 0.7–4.0)
MCH: 28.2 pg (ref 26.0–34.0)
MCHC: 33.4 g/dL (ref 30.0–36.0)
MCV: 84.4 fL (ref 80.0–100.0)
Monocytes Absolute: 0.1 K/uL (ref 0.1–1.0)
Monocytes Relative: 4 %
Neutro Abs: 2.9 K/uL (ref 1.7–7.7)
Neutrophils Relative %: 87 %
Platelets: 268 K/uL (ref 150–400)
RBC: 4.11 MIL/uL (ref 3.87–5.11)
RDW: 14.1 % (ref 11.5–15.5)
Smear Review: NORMAL
WBC: 3.3 K/uL — ABNORMAL LOW (ref 4.0–10.5)
nRBC: 0 % (ref 0.0–0.2)

## 2024-07-03 LAB — COMPREHENSIVE METABOLIC PANEL WITH GFR
ALT: 9 U/L (ref 0–44)
AST: 31 U/L (ref 15–41)
Albumin: 2.8 g/dL — ABNORMAL LOW (ref 3.5–5.0)
Alkaline Phosphatase: 40 U/L (ref 38–126)
Anion gap: 10 (ref 5–15)
BUN: 24 mg/dL — ABNORMAL HIGH (ref 8–23)
CO2: 19 mmol/L — ABNORMAL LOW (ref 22–32)
Calcium: 8.5 mg/dL — ABNORMAL LOW (ref 8.9–10.3)
Chloride: 105 mmol/L (ref 98–111)
Creatinine, Ser: 1.65 mg/dL — ABNORMAL HIGH (ref 0.44–1.00)
GFR, Estimated: 33 mL/min — ABNORMAL LOW (ref >60)
Glucose, Bld: 223 mg/dL — ABNORMAL HIGH (ref 70–99)
Potassium: 3.8 mmol/L (ref 3.5–5.1)
Sodium: 134 mmol/L — ABNORMAL LOW (ref 135–145)
Total Bilirubin: 0.9 mg/dL (ref 0.0–1.2)
Total Protein: 6.3 g/dL — ABNORMAL LOW (ref 6.5–8.1)

## 2024-07-03 LAB — POCT I-STAT 7, (LYTES, BLD GAS, ICA,H+H)
Acid-base deficit: 5 mmol/L — ABNORMAL HIGH (ref 0.0–2.0)
Bicarbonate: 20.5 mmol/L (ref 20.0–28.0)
Calcium, Ion: 1.25 mmol/L (ref 1.15–1.40)
HCT: 30 % — ABNORMAL LOW (ref 36.0–46.0)
Hemoglobin: 10.2 g/dL — ABNORMAL LOW (ref 12.0–15.0)
O2 Saturation: 100 %
Patient temperature: 98.2
Potassium: 3.7 mmol/L (ref 3.5–5.1)
Sodium: 136 mmol/L (ref 135–145)
TCO2: 22 mmol/L (ref 22–32)
pCO2 arterial: 38.7 mmHg (ref 32–48)
pH, Arterial: 7.331 — ABNORMAL LOW (ref 7.35–7.45)
pO2, Arterial: 455 mmHg — ABNORMAL HIGH (ref 83–108)

## 2024-07-03 LAB — TROPONIN I (HIGH SENSITIVITY)
Troponin I (High Sensitivity): 651 ng/L (ref <18)
Troponin I (High Sensitivity): 1493 ng/L (ref <18)

## 2024-07-03 LAB — GLUCOSE, CAPILLARY
Glucose-Capillary: 63 mg/dL — ABNORMAL LOW (ref 70–99)
Glucose-Capillary: 161 mg/dL — ABNORMAL HIGH (ref 70–99)
Glucose-Capillary: 225 mg/dL — ABNORMAL HIGH (ref 70–99)
Glucose-Capillary: 186 mg/dL — ABNORMAL HIGH (ref 70–99)

## 2024-07-03 LAB — PHOSPHORUS: Phosphorus: 4.1 mg/dL (ref 2.5–4.6)

## 2024-07-03 LAB — LACTIC ACID, PLASMA: Lactic Acid, Venous: 1 mmol/L (ref 0.5–1.9)

## 2024-07-03 LAB — MAGNESIUM: Magnesium: 1.7 mg/dL (ref 1.7–2.4)

## 2024-07-03 LAB — MRSA NEXT GEN BY PCR, NASAL: MRSA by PCR Next Gen: NOT DETECTED

## 2024-07-03 MED ORDER — DEXAMETHASONE SODIUM PHOSPHATE 10 MG/ML IJ SOLN
10.0000 mg | Freq: Once | INTRAMUSCULAR | Status: AC
  Administered 2024-07-03: 10 mg via INTRAVENOUS
  Filled 2024-07-03: qty 1

## 2024-07-03 MED ORDER — GABAPENTIN 300 MG PO CAPS
300.0000 mg | ORAL_CAPSULE | Freq: Two times a day (BID) | ORAL | Status: DC
  Administered 2024-07-03 – 2024-07-05 (×4): 300 mg
  Filled 2024-07-03 (×4): qty 1

## 2024-07-03 MED ORDER — RACEPINEPHRINE HCL 2.25 % IN NEBU
0.5000 mL | INHALATION_SOLUTION | Freq: Once | RESPIRATORY_TRACT | Status: DC
  Filled 2024-07-03: qty 15

## 2024-07-03 MED ORDER — SUCCINYLCHOLINE CHLORIDE 200 MG/10ML IV SOSY
PREFILLED_SYRINGE | INTRAVENOUS | Status: AC
  Filled 2024-07-03: qty 10

## 2024-07-03 MED ORDER — FENTANYL CITRATE PF 50 MCG/ML IJ SOSY
50.0000 ug | PREFILLED_SYRINGE | INTRAMUSCULAR | Status: DC | PRN
  Administered 2024-07-04: 50 ug via INTRAVENOUS
  Filled 2024-07-03: qty 1

## 2024-07-03 MED ORDER — ALBUTEROL SULFATE (2.5 MG/3ML) 0.083% IN NEBU
2.5000 mg | INHALATION_SOLUTION | Freq: Once | RESPIRATORY_TRACT | Status: DC | PRN
Start: 2024-07-03 — End: 2024-07-05
  Filled 2024-07-03: qty 3

## 2024-07-03 MED ORDER — DIPHENHYDRAMINE HCL 50 MG/ML IJ SOLN
25.0000 mg | Freq: Once | INTRAMUSCULAR | Status: AC
  Administered 2024-07-03: 25 mg via INTRAVENOUS

## 2024-07-03 MED ORDER — FENTANYL CITRATE PF 50 MCG/ML IJ SOSY
PREFILLED_SYRINGE | INTRAMUSCULAR | Status: AC
Start: 2024-07-03 — End: 2024-07-04
  Filled 2024-07-03: qty 2

## 2024-07-03 MED ORDER — EPINEPHRINE PF 1 MG/ML IJ SOLN: 0.3000 mg | Freq: Once | INTRAMUSCULAR | Status: AC

## 2024-07-03 MED ORDER — METHYLPREDNISOLONE SODIUM SUCC 125 MG IJ SOLR: 125.0000 mg | Freq: Once | INTRAMUSCULAR | Status: AC

## 2024-07-03 MED ORDER — LORATADINE 10 MG PO TABS
10.0000 mg | ORAL_TABLET | Freq: Every day | ORAL | Status: DC
  Administered 2024-07-03: 10 mg via ORAL
  Filled 2024-07-03: qty 1

## 2024-07-03 MED ORDER — METRONIDAZOLE 500 MG/100ML IV SOLN: 500.0000 mg | Freq: Two times a day (BID) | INTRAVENOUS | Status: DC

## 2024-07-03 MED ORDER — FAMOTIDINE 20 MG PO TABS: 20.0000 mg | ORAL_TABLET | Freq: Two times a day (BID) | ORAL | Status: DC

## 2024-07-03 MED ORDER — DOCUSATE SODIUM 50 MG/5ML PO LIQD
100.0000 mg | Freq: Two times a day (BID) | ORAL | Status: DC
  Administered 2024-07-04 – 2024-07-05 (×3): 100 mg
  Filled 2024-07-03 (×3): qty 10

## 2024-07-03 MED ORDER — INSULIN ASPART 100 UNIT/ML IJ SOLN
2.0000 [IU] | INTRAMUSCULAR | Status: DC
  Administered 2024-07-03: 6 [IU] via SUBCUTANEOUS
  Administered 2024-07-03: 4 [IU] via SUBCUTANEOUS
  Administered 2024-07-04: 2 [IU] via SUBCUTANEOUS
  Administered 2024-07-04: 4 [IU] via SUBCUTANEOUS
  Administered 2024-07-04: 2 [IU] via SUBCUTANEOUS
  Administered 2024-07-04: 4 [IU] via SUBCUTANEOUS
  Administered 2024-07-04: 2 [IU] via SUBCUTANEOUS
  Administered 2024-07-04: 4 [IU] via SUBCUTANEOUS
  Administered 2024-07-05: 6 [IU] via SUBCUTANEOUS
  Administered 2024-07-05: 4 [IU] via SUBCUTANEOUS

## 2024-07-03 MED ORDER — POLYETHYLENE GLYCOL 3350 17 G PO PACK: 17.0000 g | PACK | Freq: Every day | ORAL | Status: DC

## 2024-07-03 MED ORDER — MIDAZOLAM HCL 2 MG/2ML IJ SOLN
INTRAMUSCULAR | Status: AC
  Filled 2024-07-03: qty 2

## 2024-07-03 MED ORDER — SODIUM CHLORIDE 0.9 % IV BOLUS
1000.0000 mL | Freq: Once | INTRAVENOUS | Status: AC
  Administered 2024-07-03: 1000 mL via INTRAVENOUS

## 2024-07-03 MED ORDER — ORAL CARE MOUTH RINSE
15.0000 mL | OROMUCOSAL | Status: DC | PRN
Start: 2024-07-03 — End: 2024-07-09

## 2024-07-03 MED ORDER — AMOXICILLIN-POT CLAVULANATE 500-125 MG PO TABS
1.0000 | ORAL_TABLET | Freq: Two times a day (BID) | ORAL | Status: DC
  Administered 2024-07-03: 1 via ORAL
  Filled 2024-07-03 (×3): qty 1

## 2024-07-03 MED ORDER — EPINEPHRINE 1 MG/10ML IJ SOSY
PREFILLED_SYRINGE | INTRAMUSCULAR | Status: AC
  Filled 2024-07-03: qty 10

## 2024-07-03 MED ORDER — NOREPINEPHRINE 4 MG/250ML-% IV SOLN
INTRAVENOUS | Status: AC
  Filled 2024-07-03: qty 250

## 2024-07-03 MED ORDER — NAPROXEN 250 MG PO TABS
500.0000 mg | ORAL_TABLET | Freq: Once | ORAL | Status: AC
  Administered 2024-07-03: 500 mg via ORAL
  Filled 2024-07-03: qty 2

## 2024-07-03 MED ORDER — DIPHENHYDRAMINE HCL 25 MG PO CAPS
25.0000 mg | ORAL_CAPSULE | Freq: Once | ORAL | Status: AC
  Administered 2024-07-03: 25 mg via ORAL
  Filled 2024-07-03: qty 1

## 2024-07-03 MED ORDER — PANTOPRAZOLE SODIUM 40 MG IV SOLR
40.0000 mg | Freq: Every day | INTRAVENOUS | Status: DC
  Administered 2024-07-03: 40 mg via INTRAVENOUS
  Filled 2024-07-03: qty 10

## 2024-07-03 MED ORDER — IOHEXOL 350 MG/ML SOLN
65.0000 mL | Freq: Once | INTRAVENOUS | Status: AC | PRN
  Administered 2024-07-03: 65 mL via INTRAVENOUS

## 2024-07-03 MED ORDER — ROCURONIUM BROMIDE 10 MG/ML (PF) SYRINGE
PREFILLED_SYRINGE | INTRAVENOUS | Status: AC
  Filled 2024-07-03: qty 10

## 2024-07-03 MED ORDER — EPINEPHRINE PF 1 MG/ML IJ SOLN
INTRAMUSCULAR | Status: AC
  Administered 2024-07-03: 0.15 mg via INTRAMUSCULAR
  Filled 2024-07-03: qty 1

## 2024-07-03 MED ORDER — METRONIDAZOLE 500 MG/100ML IV SOLN
500.0000 mg | Freq: Two times a day (BID) | INTRAVENOUS | Status: AC
  Administered 2024-07-03 – 2024-07-06 (×7): 500 mg via INTRAVENOUS
  Filled 2024-07-03 (×7): qty 100

## 2024-07-03 MED ORDER — METHYLPREDNISOLONE SODIUM SUCC 125 MG IJ SOLR
80.0000 mg | Freq: Two times a day (BID) | INTRAMUSCULAR | Status: DC
  Administered 2024-07-03 – 2024-07-05 (×4): 80 mg via INTRAVENOUS
  Filled 2024-07-03 (×4): qty 2

## 2024-07-03 MED ORDER — LORATADINE 10 MG PO TABS
10.0000 mg | ORAL_TABLET | Freq: Every day | ORAL | Status: DC
  Administered 2024-07-04 – 2024-07-05 (×2): 10 mg
  Filled 2024-07-03 (×2): qty 1

## 2024-07-03 MED ORDER — ENOXAPARIN SODIUM 40 MG/0.4ML IJ SOSY
40.0000 mg | PREFILLED_SYRINGE | INTRAMUSCULAR | Status: DC
  Administered 2024-07-03: 40 mg via SUBCUTANEOUS
  Filled 2024-07-03: qty 0.4

## 2024-07-03 MED ORDER — PHENOL 1.4 % MT LIQD
1.0000 | OROMUCOSAL | Status: DC | PRN
  Filled 2024-07-03: qty 177

## 2024-07-03 MED ORDER — PHENYLEPHRINE 80 MCG/ML (10ML) SYRINGE FOR IV PUSH (FOR BLOOD PRESSURE SUPPORT)
PREFILLED_SYRINGE | INTRAVENOUS | Status: AC
  Filled 2024-07-03: qty 10

## 2024-07-03 MED ORDER — ORAL CARE MOUTH RINSE
15.0000 mL | OROMUCOSAL | Status: DC
  Administered 2024-07-03 – 2024-07-05 (×20): 15 mL via OROMUCOSAL

## 2024-07-03 MED ORDER — AMOXICILLIN-POT CLAVULANATE 500-125 MG PO TABS: 1.0000 | ORAL_TABLET | Freq: Two times a day (BID) | ORAL | 0 refills | Status: DC

## 2024-07-03 MED ORDER — EPINEPHRINE 0.3 MG/0.3ML IJ SOAJ
0.3000 mg | Freq: Once | INTRAMUSCULAR | Status: DC
  Filled 2024-07-03: qty 0.3

## 2024-07-03 MED ORDER — CHLORHEXIDINE GLUCONATE CLOTH 2 % EX PADS
6.0000 | MEDICATED_PAD | Freq: Every day | CUTANEOUS | Status: DC
  Administered 2024-07-03 – 2024-07-05 (×3): 6 via TOPICAL

## 2024-07-03 MED ORDER — DIPHENHYDRAMINE HCL 50 MG/ML IJ SOLN
INTRAMUSCULAR | Status: AC
  Filled 2024-07-03: qty 1

## 2024-07-03 MED ORDER — METHYLPREDNISOLONE SODIUM SUCC 125 MG IJ SOLR
INTRAMUSCULAR | Status: AC
  Administered 2024-07-03: 125 mg via INTRAVENOUS
  Filled 2024-07-03: qty 2

## 2024-07-03 MED ORDER — LEVOTHYROXINE SODIUM 25 MCG PO TABS
25.0000 ug | ORAL_TABLET | Freq: Every day | ORAL | Status: DC
  Administered 2024-07-04 – 2024-07-05 (×2): 25 ug
  Filled 2024-07-03 (×2): qty 1

## 2024-07-03 MED ORDER — SODIUM CHLORIDE 0.9 % IV SOLN
1.0000 g | Freq: Three times a day (TID) | INTRAVENOUS | Status: AC
  Administered 2024-07-03 – 2024-07-06 (×10): 1 g via INTRAVENOUS
  Filled 2024-07-03 (×12): qty 5

## 2024-07-03 MED ORDER — AMOXICILLIN-POT CLAVULANATE 875-125 MG PO TABS: 1.0000 | ORAL_TABLET | Freq: Two times a day (BID) | ORAL | Status: DC

## 2024-07-03 MED ORDER — PROPOFOL 1000 MG/100ML IV EMUL
0.0000 ug/kg/min | INTRAVENOUS | Status: DC
  Administered 2024-07-03: 20 ug/kg/min via INTRAVENOUS
  Administered 2024-07-04: 40 ug/kg/min via INTRAVENOUS
  Administered 2024-07-04: 20 ug/kg/min via INTRAVENOUS
  Administered 2024-07-05: 25 ug/kg/min via INTRAVENOUS
  Filled 2024-07-03 (×5): qty 100

## 2024-07-03 MED ORDER — ETOMIDATE 2 MG/ML IV SOLN
INTRAVENOUS | Status: AC
  Filled 2024-07-03: qty 20

## 2024-07-03 MED ORDER — DICYCLOMINE HCL 10 MG PO CAPS
10.0000 mg | ORAL_CAPSULE | Freq: Three times a day (TID) | ORAL | Status: DC
  Administered 2024-07-03 – 2024-07-05 (×8): 10 mg
  Filled 2024-07-03 (×11): qty 1

## 2024-07-03 NOTE — Progress Notes (Incomplete)
 Attending Note  Patient seen and discussed with PA Myra, I agree with his documentation. 73 yo female history of CAD with prior CABG in 08/2024 [facility]- LAD, SVG-OM, SVG-PDA,  HTN, DM2, SLE, admitted yesterday with abdominal pain and diarrhea,issues with hypoglycemia. Admitted for diverticulitis by primary team. This afternoon acute respiratory distress in setting of tongue and throat swelling. Intuated by CCM for angioedema, respiratory distress. From notes EKG changes post intubation, cardiology consulted    09/2022 echo: >75%, no WMAs, normal RV   1.History of CAD/prior CABG CABG in 08/2024 [facility]- LAD, SVG-OM, SVG-PDA EKG shows SR, LAFB. Isolated upsloping ST elevation in V1/V2 without reciprocal changes. ST pattern is not consistent with acute injury. This EKG is only in the paper chart. Repeat with EKG without diagnostic ST/T changes.  - trops are pending, echo pending - at this time do not suspect a primary cardiac event. Would suspect some degree of troponin elevation given severe systemic stress from angioedema leading to respiratory failure and intubation, markedly hypertensive during episode 220s/90s as well.  - no indication for anticoagulation at this time. F/u trops and echo for now.    2. Respiratory failure - in setting of angioedema, management per CCM   3. Diverticulitis - initial reason for admission, presented with abdominal pain and diarrhea  Dorn Ross MD

## 2024-07-03 NOTE — Procedures (Addendum)
 Intubation Procedure Note  Jasmine Keith  [id number]  01/19/1951  Date:07/03/24  Time:5:03 PM   Provider Performing:Dawna Jakes    Procedure: Intubation (31500)  Indication(s) Respiratory Failure  Consent Unable to obtain consent due to emergent nature of procedure.   Anesthesia Etomidate  and Rocuronium    Time Out Verified patient identification, verified procedure, site/side was marked, verified correct patient position, special equipment/implants available, medications/allergies/relevant history reviewed, required imaging and test results available.   Sterile Technique Usual hand hygeine, masks, and gloves were used   Procedure Description Patient positioned in bed supine.  Sedation given as noted above.  Patient was intubated with endotracheal tube using Glidescope.  View was Grade 2 only posterior commissure .  Number of attempts was 1.  Colorimetric CO2 detector was consistent with tracheal placement.  There was severe supraglottic edema with difficult view of the vocal cords. A 6.0 ETT was delivered without difficulty. Immediately following intubation, there was good chest rise and color change. I then decided to insert a slim disposable flexible bronchoscope through the ETT to confirm placement in the airway. I was able to visualize the trachea, with the posterior membrane leading to the main carina and clear view of the right and left mainstem bronchi. With airway placement confirmed, the bronchoscope was withdrawn and she was ventilated and transferred to the ICU.  Complications/Tolerance None; patient tolerated the procedure well. Chest X-ray is ordered to verify placement.   EBL Minimal   Specimen(s) None  Jasmine November, MD [facility] 07/03/2024 5:04 PM

## 2024-07-03 NOTE — Plan of Care (Signed)

## 2024-07-03 NOTE — Progress Notes (Signed)
 Dr. Lenon notified of pt continuous c/o trouble swallowing and pt noted to have visible tongue swelling. Order placed by MD for PO Benadryl , PO Claritin  and IV Decadron , please see MAR for administration. Pt was given meds, VS taken and auscultation of lungs performed. Stridor was noted on lung auscultation and  rapid response was called at this time.

## 2024-07-03 NOTE — Progress Notes (Signed)
 RT called to patient room due to allergic reaction noted.  Upon arrival, patient noted to have a swollen tongue.  Sats and vitals were stable at that time.  Treatments were given and during the time of notifying MD and giving treatments patient's airway began to swell larger.  Patient was intubated at bedside by ICU MD without complications.  Bilateral breath sounds were auscultated.  Positive color change was noted.  Confirmed with bronch.  Patient then transported to ICU and placed on ventilator.  Tolerating well at this time.  Will continue to monitor.

## 2024-07-03 NOTE — Progress Notes (Signed)
 Call was placed to patients husband Ryan at this time and notified of pt possible allergic reaction, emergency team was called in and pt was now intubated. Notified husband that pt was stable and would be transferred to the ICU at this time.

## 2024-07-03 NOTE — Progress Notes (Signed)
 PT was transported to CT and back to 3M-07 with no complications.
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
## 2024-07-03 NOTE — Discharge Instructions (Signed)
 Please complete your antibiotic course for diverticulitis.  Follow up with your PCP in about 1 week to monitor your healing.   I recommend that when you are not feeling well and not eating your normal diet, do not take your full insulin  dose. You can check your blood sugars throughout the day to monitor for low blood sugars if not eating well.   Social Connections   -[facility] (Adult Program)  - Address: [address] [place], [place] [address] - General office #:  [phone] - Enrollment Phone #: [phone]  -[facility]  - [facility] Line: call toll free, available 24 hours a day, at [phone]   -[place] [address]  [place] 2-1-1 is another useful way to locate resources in the community. Visit [email or web address].ch to find service information online. If you need additional assistance, 2-1-1 Referral Specialists are available 24 hours a day, every day by dialing 2-1-1 or [phone] from any phone. The call is free, confidential, and available in any language.  -[facility] [place]: [phone] / [address] [place], [place] [address]  -Dial 988: Talk lifeline 24/7.  -[place]  - [facility] Warmline: [phone]

## 2024-07-03 NOTE — Progress Notes (Signed)
 Transition of Care [facility]) - Inpatient Brief Assessment   Patient Details  Name: Jasmine Keith MRN: [id number] Date of Birth: [birth year]  [facility] CM/SW Contact:    Rosaline JONELLE Joe, RN Phone Number: 07/03/2024, 12:04 PM   Clinical Narrative: Patient admitted from home with spouse with diverticulitis.  No IP Care management needs at this time.  Patient is planned to return home with no needs noted.   Transition of Care Asessment: Insurance and Status: (P) Insurance coverage has been reviewed Patient has primary care physician: (P) Yes Home environment has been reviewed: (P) from home with spouse Prior level of function:: (P) self Prior/Current Home Services: (P) No current home services Social Drivers of Health Review: (P) SDOH reviewed no interventions necessary Readmission risk has been reviewed: (P) Yes Transition of care needs: (P) no transition of care needs at this time

## 2024-07-03 NOTE — Significant Event (Signed)
 Rapid Response Event Note   Reason for Call :  Possible anaphylaxis, swollen tongue  Initial Focused Assessment:  Arrived to room to find patient able to talk, oriented, anxious, tongue visibly swollen. Pt complaining it was difficult to take a breath in. Per RN, PO benadryl  and IV decadron  already given. Per protocol, anaphylaxis orders placed. CCM notified of compromised airway though unsure if its due to anaphylaxis. Patient began to rapidly deteriorate as meds and CCM arriving to bedside--decision made to intubate on 2W prior to tx to ICU.   91/59 (69) HR 85 RR 22 O2 100% NC3L (just placed)  Interventions/Plan of Care:  CCM called to bedside PIV placed (loss of previous access) Epi Benadryl  IV NS bolus  Solumedrol IV 125mg  RSI: etom and roc IV Pt intubated emergently 2W, taken to 2M07  Event Summary:  MD Notified: LOIS November MD Call Time: [id number] Arrival Time: 1624 End Time: 1720  Karmah Potocki K, RN
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
## 2024-07-03 NOTE — Progress Notes (Signed)
 PROGRESS NOTE  Jasmine Keith    DOB: 10-13-1951, 73 y.o.  FMW:[id number]    Code Status: Full Code   DOA: 07/02/2024   LOS: 0   Brief hospital course  Jasmine Keith is a 73 y.o. female with a PMH significant for CAD s/p CABG, T2DM, GERD, stage IIIa CKD, and CDI p/w acute diverticulitis and hypoglycemia.   In the ED, pt hypertensive and intermittently tachypneic w/o hypoxia. Labs notable for Cr 1.5 (at baseline), glucose 60-->200s. CT abd showed diverticulitis. EDP requested admission for IV abx and hypoglycemia.  07/03/24 -eating breakfast and tolerating well. Discharged home but shortly after eating had diarrhea and acute onset of abdominal discomfort. Her and husband do not want to go home. Her blood sugars are holding steady.   Assessment & Plan  Principal Problem:   Diverticulitis  Hypoglycemia- monitor. Returned to normal range.   DM2 -Lantus  20U + SSI TID AC prn  Diverticulitis S/p IV CTX and flagyl  in the ED -PO Agumentin 875/125 BID   HTN -PTA amlodipine  5mg  daily and Coreg  6.25mg  BID   SLE -PTA plaquenil    Hypothyroid -PTA levothyroxine   Body mass index is 19.65 kg/m.  VTE ppx: SCDs Start: 07/02/24 1456  Diet:     Diet   Diet regular Room service appropriate? Yes; Fluid consistency: Thin   Consultants: None   Subjective 07/03/24    Pt reports feeling well. Denies abdominal pain nor nausea.    Objective  Blood pressure (!) 142/95, pulse 77, temperature 98.9 F (37.2 C), resp. rate 20, height 5' 1 (1.549 m), weight 47.2 kg, SpO2 97%.  Intake/Output Summary (Last 24 hours) at 07/03/2024 0830 Last data filed at 07/02/2024 1626 Gross per 24 hour  Intake 1240.55 ml  Output --  Net 1240.55 ml   Filed Weights   07/02/24 1030  Weight: 47.2 kg    Physical Exam:  General: awake, alert, NAD HEENT: atraumatic, clear conjunctiva, anicteric sclera, MMM, hearing grossly normal Respiratory: normal respiratory effort. Cardiovascular: quick  capillary refill, normal S1/S2, RRR, no JVD, murmurs Gastrointestinal: soft, NT, ND Nervous: A&O x3. no gross focal neurologic deficits, normal speech Extremities: moves all equally, no edema, normal tone Skin: dry, intact, normal temperature, normal color. No rashes, lesions or ulcers on exposed skin Psychiatry: normal mood, congruent affect  Labs   I have personally reviewed the following labs and imaging studies CBC    Component Value Date/Time   WBC 5.3 07/02/2024 1029   RBC 3.45 (L) 07/02/2024 1029   HGB 9.9 (L) 07/02/2024 1029   HGB 10.4 (L) 05/01/2024 1309   HGB 9.7 (L) 07/20/2023 1159   HCT 31.1 (L) 07/02/2024 1029   HCT 31.3 (L) 07/20/2023 1159   PLT 227 07/02/2024 1029   PLT 271 05/01/2024 1309   PLT 357 07/20/2023 1159   MCV 90.1 07/02/2024 1029   MCV 87 07/20/2023 1159   MCH 28.7 07/02/2024 1029   MCHC 31.8 07/02/2024 1029   RDW 14.4 07/02/2024 1029   RDW 13.2 07/20/2023 1159   LYMPHSABS 0.4 (L) 05/01/2024 1309   LYMPHSABS 1.1 04/19/2023 1147   MONOABS 0.1 05/01/2024 1309   EOSABS 0.1 05/01/2024 1309   EOSABS 0.1 04/19/2023 1147   BASOSABS 0.0 05/01/2024 1309   BASOSABS 0.1 04/19/2023 1147      Latest Ref Rng & Units 07/02/2024   10:29 AM 06/09/2024    5:42 PM 05/01/2024    1:09 PM  BMP  Glucose 70 - 99 mg/dL 60  796  190   BUN 8 - 23 mg/dL 22  25  35   Creatinine 0.44 - 1.00 mg/dL 8.41  8.40  8.36   Sodium 135 - 145 mmol/L 133  132  136   Potassium 3.5 - 5.1 mmol/L 3.5  4.6  5.0   Chloride 98 - 111 mmol/L 105  100  103   CO2 22 - 32 mmol/L 18  19  24    Calcium  8.9 - 10.3 mg/dL 8.2  9.3  89.8     CT ABDOMEN PELVIS W CONTRAST Result Date: 07/02/2024 CLINICAL DATA:  Lower abdominal pain, melena EXAM: CT ABDOMEN AND PELVIS WITH CONTRAST TECHNIQUE: Multidetector CT imaging of the abdomen and pelvis was performed using the standard protocol following bolus administration of intravenous contrast. RADIATION DOSE REDUCTION: This exam was performed according to the  departmental dose-optimization program which includes automated exposure control, adjustment of the mA and/or kV according to patient size and/or use of iterative reconstruction technique. CONTRAST:  75mL OMNIPAQUE  IOHEXOL  350 MG/ML SOLN COMPARISON:  None Available. FINDINGS: Lower chest: Cardiomegaly. Status post. Dependent atelectasis in both lower lobes. No acute process. Hepatobiliary: Normal hepatic contour and morphology. No discrete hepatic lesions. Normal appearance of the gallbladder. No intra or extrahepatic biliary ductal dilatation. Pancreas: Unremarkable. No pancreatic ductal dilatation or surrounding inflammatory changes. Spleen: Normal in size without focal abnormality. Adrenals/Urinary Tract: Normal adrenal glands. No hydronephrosis, nephrolithiasis or enhancing renal mass. 1.5 cm simple cyst in the interpolar left kidney. No imaging follow-up is recommended. Ureters and bladder are unremarkable. Stomach/Bowel: Diffuse submucosal edema of the rectum and sigmoid colon in a region with multiple small diverticula. Inflammatory stranding present adjacent to the involved sigmoid colon and rectum. Findings are most consistent with acute diverticulitis and proctitis. No evidence of free air or abscess. No obstruction. Vascular/Lymphatic: Extensive calcified atherosclerotic plaque throughout the abdominal aorta. Fusiform aneurysmal dilation of the infrarenal abdominal aorta with a maximal diameter of 2.8 cm. 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 Severe calcified plaque results in high-grade stenosis of the left common iliac artery. Probable occlusion of both hypogastric arteries. No suspicious lymphadenopathy. Reproductive: Uterus and bilateral adnexa are unremarkable. Other: No abdominal wall hernia or abnormality. No abdominopelvic ascites. Musculoskeletal: No acute fracture or aggressive appearing lytic or blastic osseous lesion. IMPRESSION: 1. CT findings are consistent with acute  uncomplicated sigmoid diverticulitis with additional inflammatory changes extending to the upper rectum. No evidence of perforation or abscess. 2. Fusiform infrarenal abdominal aortic aneurysm with a maximal diameter of 2.8 cm. Recommend follow-up ultrasound every 5 years. (Ref.: J Vasc Surg. 2018; 67:2-77 and J Am Coll Radiol 2013;10(10):789-794.) 3. 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 4. Cardiomegaly. 5. Additional ancillary findings as above. Electronically Signed   By: Wilkie Lent M.D.   On: 07/02/2024 13:26    Disposition Plan & Communication  Patient status: Observation  Admitted From: Home Planned disposition location: Home Anticipated discharge date: 8/29 pending clinical improvement   Family Communication: husband at bedside     Author: Marien LITTIE Piety, DO [facility] 07/03/2024, 8:30 AM   Available by Epic secure chat 7AM-7PM. If 7PM-7AM, please contact night-coverage.  [facility] contact information found on [email or web address].

## 2024-07-04 ENCOUNTER — Observation Stay (HOSPITAL_COMMUNITY)

## 2024-07-04 DIAGNOSIS — I129 Hypertensive chronic kidney disease with stage 1 through stage 4 chronic kidney disease, or unspecified chronic kidney disease: Secondary | ICD-10-CM | POA: Diagnosis present

## 2024-07-04 DIAGNOSIS — T783XXA Angioneurotic edema, initial encounter: Secondary | ICD-10-CM | POA: Diagnosis present

## 2024-07-04 DIAGNOSIS — E039 Hypothyroidism, unspecified: Secondary | ICD-10-CM | POA: Diagnosis present

## 2024-07-04 DIAGNOSIS — I714 Abdominal aortic aneurysm, without rupture, unspecified: Secondary | ICD-10-CM | POA: Diagnosis present

## 2024-07-04 DIAGNOSIS — K5792 Diverticulitis of intestine, part unspecified, without perforation or abscess without bleeding: Secondary | ICD-10-CM | POA: Diagnosis present

## 2024-07-04 DIAGNOSIS — Z794 Long term (current) use of insulin: Secondary | ICD-10-CM | POA: Diagnosis not present

## 2024-07-04 DIAGNOSIS — G9349 Other encephalopathy: Secondary | ICD-10-CM | POA: Diagnosis present

## 2024-07-04 DIAGNOSIS — I2489 Other forms of acute ischemic heart disease: Secondary | ICD-10-CM | POA: Diagnosis present

## 2024-07-04 DIAGNOSIS — J9600 Acute respiratory failure, unspecified whether with hypoxia or hypercapnia: Secondary | ICD-10-CM | POA: Diagnosis not present

## 2024-07-04 DIAGNOSIS — M329 Systemic lupus erythematosus, unspecified: Secondary | ICD-10-CM | POA: Diagnosis present

## 2024-07-04 DIAGNOSIS — K5732 Diverticulitis of large intestine without perforation or abscess without bleeding: Secondary | ICD-10-CM | POA: Diagnosis present

## 2024-07-04 DIAGNOSIS — E43 Unspecified severe protein-calorie malnutrition: Secondary | ICD-10-CM | POA: Diagnosis present

## 2024-07-04 DIAGNOSIS — I2510 Atherosclerotic heart disease of native coronary artery without angina pectoris: Secondary | ICD-10-CM | POA: Diagnosis present

## 2024-07-04 DIAGNOSIS — R578 Other shock: Secondary | ICD-10-CM | POA: Diagnosis present

## 2024-07-04 DIAGNOSIS — D631 Anemia in chronic kidney disease: Secondary | ICD-10-CM | POA: Diagnosis present

## 2024-07-04 DIAGNOSIS — R0609 Other forms of dyspnea: Secondary | ICD-10-CM | POA: Diagnosis not present

## 2024-07-04 DIAGNOSIS — J9601 Acute respiratory failure with hypoxia: Secondary | ICD-10-CM | POA: Diagnosis present

## 2024-07-04 DIAGNOSIS — Z681 Body mass index (BMI) 19 or less, adult: Secondary | ICD-10-CM | POA: Diagnosis not present

## 2024-07-04 DIAGNOSIS — Y92239 Unspecified place in hospital as the place of occurrence of the external cause: Secondary | ICD-10-CM | POA: Diagnosis present

## 2024-07-04 DIAGNOSIS — E872 Acidosis, unspecified: Secondary | ICD-10-CM | POA: Diagnosis present

## 2024-07-04 DIAGNOSIS — I82611 Acute embolism and thrombosis of superficial veins of right upper extremity: Secondary | ICD-10-CM | POA: Diagnosis present

## 2024-07-04 DIAGNOSIS — E785 Hyperlipidemia, unspecified: Secondary | ICD-10-CM | POA: Diagnosis present

## 2024-07-04 DIAGNOSIS — E11649 Type 2 diabetes mellitus with hypoglycemia without coma: Secondary | ICD-10-CM | POA: Diagnosis present

## 2024-07-04 DIAGNOSIS — T783XXD Angioneurotic edema, subsequent encounter: Secondary | ICD-10-CM | POA: Diagnosis not present

## 2024-07-04 DIAGNOSIS — N184 Chronic kidney disease, stage 4 (severe): Secondary | ICD-10-CM | POA: Diagnosis present

## 2024-07-04 DIAGNOSIS — M7989 Other specified soft tissue disorders: Secondary | ICD-10-CM | POA: Diagnosis not present

## 2024-07-04 DIAGNOSIS — E871 Hypo-osmolality and hyponatremia: Secondary | ICD-10-CM | POA: Diagnosis present

## 2024-07-04 DIAGNOSIS — K625 Hemorrhage of anus and rectum: Secondary | ICD-10-CM | POA: Diagnosis not present

## 2024-07-04 DIAGNOSIS — E1165 Type 2 diabetes mellitus with hyperglycemia: Secondary | ICD-10-CM | POA: Diagnosis present

## 2024-07-04 DIAGNOSIS — N170 Acute kidney failure with tubular necrosis: Secondary | ICD-10-CM | POA: Diagnosis present

## 2024-07-04 LAB — ECHOCARDIOGRAM COMPLETE
AR max vel: 2.15 cm2
AV Area VTI: 2.14 cm2
AV Area mean vel: 2.12 cm2
AV Mean grad: 3 mmHg
AV Peak grad: 5.3 mmHg
Ao pk vel: 1.15 m/s
Area-P 1/2: 2.26 cm2
Height: 63 in
S' Lateral: 1.9 cm
Weight: 1671.97 [oz_av]

## 2024-07-04 LAB — BASIC METABOLIC PANEL WITH GFR
Anion gap: 15 (ref 5–15)
BUN: 34 mg/dL — ABNORMAL HIGH (ref 8–23)
CO2: 18 mmol/L — ABNORMAL LOW (ref 22–32)
Calcium: 8.5 mg/dL — ABNORMAL LOW (ref 8.9–10.3)
Chloride: 102 mmol/L (ref 98–111)
Creatinine, Ser: 2.4 mg/dL — ABNORMAL HIGH (ref 0.44–1.00)
GFR, Estimated: 21 mL/min — ABNORMAL LOW (ref >60)
Glucose, Bld: 151 mg/dL — ABNORMAL HIGH (ref 70–99)
Potassium: 4 mmol/L (ref 3.5–5.1)
Sodium: 135 mmol/L (ref 135–145)

## 2024-07-04 LAB — CBC WITH DIFFERENTIAL/PLATELET
Abs Immature Granulocytes: 0.01 K/uL (ref 0.00–0.07)
Basophils Absolute: 0 K/uL (ref 0.0–0.1)
Basophils Relative: 0 %
Eosinophils Absolute: 0 K/uL (ref 0.0–0.5)
Eosinophils Relative: 0 %
HCT: 31.1 % — ABNORMAL LOW (ref 36.0–46.0)
Hemoglobin: 10.3 g/dL — ABNORMAL LOW (ref 12.0–15.0)
Immature Granulocytes: 0 %
Lymphocytes Relative: 13 %
Lymphs Abs: 0.4 K/uL — ABNORMAL LOW (ref 0.7–4.0)
MCH: 27.8 pg (ref 26.0–34.0)
MCHC: 33.1 g/dL (ref 30.0–36.0)
MCV: 83.8 fL (ref 80.0–100.0)
Monocytes Absolute: 0.1 K/uL (ref 0.1–1.0)
Monocytes Relative: 4 %
Neutro Abs: 2.3 K/uL (ref 1.7–7.7)
Neutrophils Relative %: 83 %
Platelets: 218 K/uL (ref 150–400)
RBC: 3.71 MIL/uL — ABNORMAL LOW (ref 3.87–5.11)
RDW: 14.2 % (ref 11.5–15.5)
WBC: 2.8 K/uL — ABNORMAL LOW (ref 4.0–10.5)
nRBC: 0 % (ref 0.0–0.2)

## 2024-07-04 LAB — COMPREHENSIVE METABOLIC PANEL WITH GFR
ALT: 9 U/L (ref 0–44)
AST: 30 U/L (ref 15–41)
Albumin: 2.4 g/dL — ABNORMAL LOW (ref 3.5–5.0)
Alkaline Phosphatase: 30 U/L — ABNORMAL LOW (ref 38–126)
Anion gap: 12 (ref 5–15)
BUN: 28 mg/dL — ABNORMAL HIGH (ref 8–23)
CO2: 17 mmol/L — ABNORMAL LOW (ref 22–32)
Calcium: 8.5 mg/dL — ABNORMAL LOW (ref 8.9–10.3)
Chloride: 105 mmol/L (ref 98–111)
Creatinine, Ser: 1.93 mg/dL — ABNORMAL HIGH (ref 0.44–1.00)
GFR, Estimated: 27 mL/min — ABNORMAL LOW (ref >60)
Glucose, Bld: 158 mg/dL — ABNORMAL HIGH (ref 70–99)
Potassium: 3.9 mmol/L (ref 3.5–5.1)
Sodium: 134 mmol/L — ABNORMAL LOW (ref 135–145)
Total Bilirubin: 0.8 mg/dL (ref 0.0–1.2)
Total Protein: 5.7 g/dL — ABNORMAL LOW (ref 6.5–8.1)

## 2024-07-04 LAB — GLUCOSE, CAPILLARY
Glucose-Capillary: 146 mg/dL — ABNORMAL HIGH (ref 70–99)
Glucose-Capillary: 135 mg/dL — ABNORMAL HIGH (ref 70–99)
Glucose-Capillary: 140 mg/dL — ABNORMAL HIGH (ref 70–99)
Glucose-Capillary: 152 mg/dL — ABNORMAL HIGH (ref 70–99)
Glucose-Capillary: 153 mg/dL — ABNORMAL HIGH (ref 70–99)
Glucose-Capillary: 197 mg/dL — ABNORMAL HIGH (ref 70–99)

## 2024-07-04 LAB — TRIGLYCERIDES: Triglycerides: 237 mg/dL — ABNORMAL HIGH (ref <150)

## 2024-07-04 LAB — TROPONIN I (HIGH SENSITIVITY): Troponin I (High Sensitivity): 1181 ng/L (ref <18)

## 2024-07-04 MED ORDER — SODIUM CHLORIDE 0.9 % IV BOLUS
500.0000 mL | Freq: Once | INTRAVENOUS | Status: AC
  Administered 2024-07-04: 500 mL via INTRAVENOUS

## 2024-07-04 MED ORDER — OSMOLITE 1.2 CAL PO LIQD
1000.0000 mL | ORAL | Status: DC
  Administered 2024-07-04: 1000 mL
  Filled 2024-07-04 (×2): qty 1000

## 2024-07-04 MED ORDER — SODIUM CHLORIDE 0.9 % IV SOLN: 250.0000 mL | INTRAVENOUS | Status: AC

## 2024-07-04 MED ORDER — EPINEPHRINE 0.3 MG/0.3ML IJ SOAJ: 0.3000 mg | Freq: Once | INTRAMUSCULAR | Status: DC | PRN

## 2024-07-04 MED ORDER — THIAMINE MONONITRATE 100 MG PO TABS
100.0000 mg | ORAL_TABLET | Freq: Every day | ORAL | Status: DC
  Administered 2024-07-04 – 2024-07-05 (×2): 100 mg
  Filled 2024-07-04 (×2): qty 1

## 2024-07-04 MED ORDER — FENTANYL 2500MCG IN NS 250ML (10MCG/ML) PREMIX INFUSION
0.0000 ug/h | INTRAVENOUS | Status: DC
  Administered 2024-07-04: 50 ug/h via INTRAVENOUS
  Filled 2024-07-04: qty 250

## 2024-07-04 MED ORDER — MAGNESIUM SULFATE 2 GM/50ML IV SOLN
2.0000 g | Freq: Once | INTRAVENOUS | Status: AC
  Administered 2024-07-04: 2 g via INTRAVENOUS
  Filled 2024-07-04: qty 50

## 2024-07-04 MED ORDER — FAMOTIDINE IN NACL 20-0.9 MG/50ML-% IV SOLN
20.0000 mg | INTRAVENOUS | Status: DC
  Administered 2024-07-04 – 2024-07-06 (×3): 20 mg via INTRAVENOUS
  Filled 2024-07-04 (×3): qty 50

## 2024-07-04 MED ORDER — VENLAFAXINE HCL 37.5 MG PO TABS
37.5000 mg | ORAL_TABLET | Freq: Two times a day (BID) | ORAL | Status: DC
  Administered 2024-07-05: 37.5 mg
  Filled 2024-07-04 (×2): qty 1

## 2024-07-04 MED ORDER — FENTANYL BOLUS VIA INFUSION
25.0000 ug | INTRAVENOUS | Status: DC | PRN
  Administered 2024-07-05 (×2): 25 ug via INTRAVENOUS

## 2024-07-04 MED ORDER — DIPHENHYDRAMINE HCL 50 MG/ML IJ SOLN
25.0000 mg | Freq: Four times a day (QID) | INTRAMUSCULAR | Status: DC
  Administered 2024-07-04 – 2024-07-09 (×21): 25 mg via INTRAVENOUS
  Filled 2024-07-04 (×21): qty 1

## 2024-07-04 MED ORDER — NOREPINEPHRINE 4 MG/250ML-% IV SOLN
0.0000 ug/min | INTRAVENOUS | Status: DC
  Administered 2024-07-04: 2 ug/min via INTRAVENOUS

## 2024-07-04 MED ORDER — HEPARIN SODIUM (PORCINE) 5000 UNIT/ML IJ SOLN
5000.0000 [IU] | Freq: Three times a day (TID) | INTRAMUSCULAR | Status: DC
  Administered 2024-07-04 – 2024-07-06 (×7): 5000 [IU] via SUBCUTANEOUS
  Filled 2024-07-04 (×7): qty 1

## 2024-07-04 NOTE — Progress Notes (Signed)
 eLink Physician-Brief Progress Note Patient Name: Jasmine Keith DOB: January 18, 1951 MRN: [id number]   Date of Service  07/04/2024  HPI/Events of Note  Patient needs restraints order to prevent self-extubation.  eICU Interventions  Restraints ordered.        Ventura Hollenbeck U Elven Laboy 07/04/2024, 5:32 AM

## 2024-07-04 NOTE — Progress Notes (Signed)
 eLink Physician-Brief Progress Note Patient Name: Jasmine Keith DOB: 20-Jan-1951 MRN: [id number]   Date of Service  07/04/2024  HPI/Events of Note  Oliguria, urinary retention, recent echo is hyperdynamic consistent with an underloaded LV, there are no regional wall motion abnormalities.  eICU Interventions  500 ml Normal Saline bolus ordered. PRN in / out Foley catheterization ordered.        Marcellina PENNER Thea Holshouser 07/04/2024, 8:02 PM

## 2024-07-04 NOTE — Progress Notes (Signed)
 NAME:  Jasmine Keith, MRN:  [id number], DOB:  30-Jun-1951, LOS: 0 ADMISSION DATE:  07/02/2024, CONSULTATION DATE:  07/03/2024 REFERRING MD:  Marien Piety , CHIEF COMPLAINT:  Angioedema    History of Present Illness:  73 year old female admitted to the hospital yesterday secondary to abdominal pain and diarrhea. CT abdomen/pelvis was obtained and showed diverticulitis.   Patient presented yesterday to the ED with above chief complaint. She was hypertensive and tachycardic, with CT abdomen/pelvis showing acute uncomplicated sigmoid diverticulitis. She was started on IV antibiotics and admitted for observation given infection and episode of hypoglycemia in the ED.   In the ED, she received IV antibiotics with Ceftriaxone  and Flagyl . This was switched to Augmentin  and she was admitted to the medical floor. This afternoon, she developed acute onset shortness of breath and tongue swelling. She was experiencing respiratory distress and a rapid response was called. PCCM presented to the bedside given concern for threatened airway.   On arrival, she was visibly in distress, but with no wheeze or rhonchi. She had gurgling sounds in her throat. She received 4 mg iv dexamethasone , 25 mg PO benadryl  prior to our arrival. We gave her 50 mg of IV benadryl  and 125 mg of IV methylprednisolone . She also received 100 mcg of IV epinephrine . Her swelling appeared to worsen and she became encephalopathic with decreased level of mentation. At this point, we decided to proceed with intubation to protect her airway. A 6.0 ETT was delivered to the airway with ease. Placement was confirmed with flexible bronchoscopy.  Pertinent  Medical History  -SLE on Hydroxychloroquine . -Hypothyroidism on Levothyroxine  -CAD s/p CABG -T2DM -CKD stage III -HTN on amlodipine  and carvedilol   Significant Hospital Events: Including procedures, antibiotic start and stop dates in addition to other pertinent events   8/27: admit for  diverticulitis and hypoglycemia 8/28: rapid response, threatened airway. 8/28: Intubated for airway protection.  Interim History / Subjective:  Overnight, patient developed hypotension requiring Levophed  and hypothermia treated with [employer]. Cardiology evaluated post-intubation troponin rise and V1-V2 ST changes, thought due to acute injury rather than ischemia.    She got high doses of propofol  for sedation, this morning weaned to about 20 mcg On of NE.  Objective   Blood pressure (!) 124/55, pulse (!) 56, temperature (!) 97.1 F (36.2 C), temperature source Axillary, resp. rate 20, height 5' 3 (1.6 m), weight 47.4 kg, SpO2 99%.    Vent Mode: PRVC FiO2 (%):  [30 %-100 %] 30 % Set Rate:  [20 bmp] 20 bmp Vt Set:  [410 mL] 410 mL PEEP:  [5 cmH20] 5 cmH20 Plateau Pressure:  [13 cmH20-16 cmH20] 16 cmH20   Intake/Output Summary (Last 24 hours) at 07/04/2024 0735 Last data filed at 07/04/2024 0600 Gross per 24 hour  Intake 1383.4 ml  Output 0 ml  Net 1383.4 ml   Filed Weights   07/02/24 1030 07/03/24 1705  Weight: 47.2 kg 47.4 kg   Examination: General: Not in distress. HENT: Neck and tongue swelling noted. Lungs: Normal air movement. Cardiovascular: RRR,no murmurs. Extremities: warm and dry. Neuro: sedated and intubated on propofol  and fentably.  Labs: WBC 3.3 >>2.8 Hb 11.6 >>10.3 SCr 1.65 >>1.93, elevated anion gap metabolic have resolved.. Troponin 651 >>1493 Lactic acidosis  CT of the neck:  Diffuse swelling of the oropharyngeal mucosa.   Resolved Hospital Problem list   None  Assessment & Plan:  73 year old female with history of SLE, CAD, T2DM, and Htn who presented  to the hospital with acute diverticulitis. She developed acute respiratory distress secondary to angioedema potentially due to medication side effect. She was intubated to secure the airway and admitted to the ICU for further management.   #Acute Hypoxic Respiratory Failure Respiratory  failure secondary to angioedema, likely allergic in origin, secondary to Augmentin . CT neck consistent with angioedema without evidence of mass. Patient remains intubated on minimal ventilator settings but continues to have significant tongue swelling and failed SBT trial. - Continue current vent setting.  # Distributive shock # Angioedema  Shock secondary to anaphylactic reaction to Augmentin , improved; currently on minimal pressors. Patient received 80 mg IV steroid for angioedema.  - Given persistent tongue swelling, will continue IV steroid 80 mg twice daily.  She is on epinephrine  IM 0.3 mg, will add IV Benadryl  25 mg.  #CAD s/p CABG  #ST changes on EKG Troponin elevation (600 ? 1400) with EKG showing ST changes in V1-V2. Cardiology consulted; impression was that isolated upsloping ST elevations in V1-V2 without reciprocal changes are not consistent with ischemia, more likely reflecting acute myocardial injury. - Trend troponin to peak - TTE - Hold amlodipine  and carvedilol   #Acute uncomplicated diverticulitis Initially admitted for acute uncomplicated diverticulitis. Patient remains afebrile with no leukocytosis. Started on Augmentin  and metronidazole , but Augmentin  was switched to aztreonam  due to anaphylactic reaction. - Continue with aztreonam  and metronidazole .  #Acute tubular necrosis #CKD stage IV ATN secondary to hypotension during anaphylactic reaction.  SCr today 1.9.   - Will check BMP this afternoon. - Will monitor UOP - Avoid nephrotoxic medicines.  #Hx of SLE History of SLE on Plaquenil . Plaquenil  currently held due to acute diverticulitis, though technically not required to hold as it is not an immunosuppressant.  # Hypothyroidism -Continued on Synthroid  25 mcg.                 Best Practice (right click and Reselect all SmartList Selections daily)   Diet/type: NPO DVT prophylaxis: prophylactic heparin   GI prophylaxis: PPI Lines: N/A Foley:  N/A Code  Status:  full code  Labs   CBC: Recent Labs  Lab 07/02/24 1029 07/03/24 1751 07/03/24 1825 07/04/24 0330  WBC 5.3  --  3.3* 2.8*  NEUTROABS  --   --  2.9 2.3  HGB 9.9* 10.2* 11.6* 10.3*  HCT 31.1* 30.0* 34.7* 31.1*  MCV 90.1  --  84.4 83.8  PLT 227  --  268 218    Basic Metabolic Panel: Recent Labs  Lab 07/02/24 1029 07/03/24 1751 07/03/24 1825 07/04/24 0330  NA 133* 136 134* 134*  K 3.5 3.7 3.8 3.9  CL 105  --  105 105  CO2 18*  --  19* 17*  GLUCOSE 60*  --  223* 158*  BUN 22  --  24* 28*  CREATININE 1.58*  --  1.65* 1.93*  CALCIUM  8.2*  --  8.5* 8.5*  MG  --   --  1.7  --   PHOS  --   --  4.1  --    GFR: Estimated Creatinine Clearance: 20 mL/min (A) (by C-G formula based on SCr of 1.93 mg/dL (H)). Recent Labs  Lab 07/02/24 1029 07/02/24 1302 07/02/24 1527 07/03/24 1825 07/04/24 0330  WBC 5.3  --   --  3.3* 2.8*  LATICACIDVEN  --  0.6 1.1 1.0  --     Liver Function Tests: Recent Labs  Lab 07/02/24 1029 07/03/24 1825 07/04/24 0330  AST 24 31 30   ALT 9 9  9  ALKPHOS 37* 40 30*  BILITOT 0.8 0.9 0.8  PROT 6.3* 6.3* 5.7*  ALBUMIN  2.8* 2.8* 2.4*   Recent Labs  Lab 07/02/24 1029  LIPASE 32   No results for input(s): AMMONIA in the last 168 hours.  ABG    Component Value Date/Time   PHART 7.331 (L) 07/03/2024 1751   PCO2ART 38.7 07/03/2024 1751   PO2ART 455 (H) 07/03/2024 1751   HCO3 20.5 07/03/2024 1751   TCO2 22 07/03/2024 1751   ACIDBASEDEF 5.0 (H) 07/03/2024 1751   O2SAT 100 07/03/2024 1751     Coagulation Profile: No results for input(s): INR, PROTIME in the last 168 hours.  Cardiac Enzymes: No results for input(s): CKTOTAL, CKMB, CKMBINDEX, TROPONINI in the last 168 hours.  HbA1C: Hgb A1c MFr Bld  Date/Time Value Ref Range Status  04/18/2024 02:54 PM 7.9 (H) <5.7 % Final    Comment:    For someone without known diabetes, a hemoglobin A1c value of 6.5% or greater indicates that they may have  diabetes and this  should be confirmed with a follow-up  test. . For someone with known diabetes, a value <7% indicates  that their diabetes is well controlled and a value  greater than or equal to 7% indicates suboptimal  control. A1c targets should be individualized based on  duration of diabetes, age, comorbid conditions, and  other considerations. . Currently, no consensus exists regarding use of hemoglobin A1c for diagnosis of diabetes for children. [name]   11/14/2023 10:32 AM 9.2 (H) 4.6 - 6.5 % Final    Comment:    Glycemic Control Guidelines for People with Diabetes:Non Diabetic:  <6%Goal of Therapy: <7%Additional Action Suggested:  >8%     CBG: Recent Labs  Lab 07/03/24 1101 07/03/24 1932 07/03/24 2320 07/04/24 0333 07/04/24 0726  GLUCAP 161* 225* 186* 146* 135*      Critical care time: 35 mins    Missy Sandhoff, MD [facility] - PGY-2 07/04/2024, 7:35 AM Pager# [phone]

## 2024-07-04 NOTE — Progress Notes (Signed)
   07/04/24 0746  Daily Weaning Assessment  Daily Assessment of Readiness to Wean Wean protocol criteria met (SBT performed)  SBT Method CPAP 5 cm H20 and PS 5 cm H20 (PS 8)  Weaning Start Time [phone]  Patient response Failed SBT terminated  Reason SBT Terminated Apnea     Yes   Daily Wake up Assessment protocol performed by this RT. Pt placed on PS/CPAP but did not tolerate due to apnea. Pt placed back on full support and is tolerating well at this time. No audible cuffleak heard w/ no VTe loss noted. CCM aware.

## 2024-07-04 NOTE — Progress Notes (Signed)
 Progress Note  Patient Name: Jasmine Keith Date of Encounter: 07/04/2024  Primary Cardiologist: Jennifer JONELLE Crape, MD   Subjective   Patient seen and examined at her bedside, She is still intubated- awake and alert.   Inpatient Medications    Scheduled Meds:  Chlorhexidine  Gluconate Cloth  6 each Topical Daily   dicyclomine   10 mg Per Tube TID AC & HS   docusate  100 mg Per Tube BID   enoxaparin  (LOVENOX ) injection  40 mg Subcutaneous Q24H   EPINEPHrine   0.3 mg Intramuscular Once   gabapentin   300 mg Per Tube BID   hydroxychloroquine   200 mg Oral Daily   insulin  aspart  2-6 Units Subcutaneous Q4H   levothyroxine   25 mcg Per Tube Q0600   loratadine   10 mg Per Tube Daily   methylPREDNISolone  (SOLU-MEDROL ) injection  80 mg Intravenous Q12H   mouth rinse  15 mL Mouth Rinse Q2H   pantoprazole  (PROTONIX ) IV  40 mg Intravenous QHS   polyethylene glycol  17 g Per Tube Daily   venlafaxine  XR  75 mg Oral Q breakfast   Continuous Infusions:  sodium chloride      aztreonam  Stopped (07/04/24 0615)   fentaNYL  infusion INTRAVENOUS 50 mcg/hr (07/04/24 0700)   metronidazole  Stopped (07/03/24 2337)   norepinephrine  (LEVOPHED ) Adult infusion 3 mcg/min (07/04/24 0700)   propofol  (DIPRIVAN ) infusion 20 mcg/kg/min (07/04/24 0700)   PRN Meds: acetaminophen , albuterol , fentaNYL  (SUBLIMAZE ) injection, fentaNYL  (SUBLIMAZE ) injection, mouth rinse, phenol   Vital Signs    Vitals:   07/04/24 0630 07/04/24 0700 07/04/24 0724 07/04/24 0800  BP: (!) 124/55 (!) 96/50  (!) 116/53  Pulse: (!) 56 (!) 56  (!) 58  Resp: 20 20  18   Temp:   (!) 97.1 F (36.2 C)   TempSrc:   Axillary   SpO2: 99% 99%  99%  Weight:      Height:        Intake/Output Summary (Last 24 hours) at 07/04/2024 0812 Last data filed at 07/04/2024 0700 Gross per 24 hour  Intake 1462.35 ml  Output 0 ml  Net 1462.35 ml   Filed Weights   07/02/24 1030 07/03/24 1705  Weight: 47.2 kg 47.4 kg    Telemetry    Sinus  rhythm  - Personally Reviewed  ECG    None today - EKG from yesterday reviewed  - Personally Reviewed  Physical Exam     General: intubated Head: Atraumatic, normal size  Eyes: PEERLA, EOMI  Neck: Supple, normal JVD Cardiac: Normal S1, S2; RRR; no murmurs, rubs, or gallops Lungs: Clear to auscultation bilaterally Abd: Soft, nontender, no hepatomegaly  Ext: warm, no edema Musculoskeletal: No deformities, BUE and BLE strength normal and equal Skin: Warm and dry, no rashes   Neuro: Alert and oriented to person, place, time, and situation, CNII-XII grossly intact, no focal deficits  Psych: Normal mood and affect   Labs    Chemistry Recent Labs  Lab 07/02/24 1029 07/03/24 1751 07/03/24 1825 07/04/24 0330  NA 133* 136 134* 134*  K 3.5 3.7 3.8 3.9  CL 105  --  105 105  CO2 18*  --  19* 17*  GLUCOSE 60*  --  223* 158*  BUN 22  --  24* 28*  CREATININE 1.58*  --  1.65* 1.93*  CALCIUM  8.2*  --  8.5* 8.5*  PROT 6.3*  --  6.3* 5.7*  ALBUMIN  2.8*  --  2.8* 2.4*  AST 24  --  31 30  ALT 9  --  9 9  ALKPHOS 37*  --  40 30*  BILITOT 0.8  --  0.9 0.8  GFRNONAA 35*  --  33* 27*  ANIONGAP 10  --  10 12     Hematology Recent Labs  Lab 07/02/24 1029 07/03/24 1751 07/03/24 1825 07/04/24 0330  WBC 5.3  --  3.3* 2.8*  RBC 3.45*  --  4.11 3.71*  HGB 9.9* 10.2* 11.6* 10.3*  HCT 31.1* 30.0* 34.7* 31.1*  MCV 90.1  --  84.4 83.8  MCH 28.7  --  28.2 27.8  MCHC 31.8  --  33.4 33.1  RDW 14.4  --  14.1 14.2  PLT 227  --  268 218    Cardiac EnzymesNo results for input(s): TROPONINI in the last 168 hours. No results for input(s): TROPIPOC in the last 168 hours.   BNPNo results for input(s): BNP, PROBNP in the last 168 hours.   DDimer No results for input(s): DDIMER in the last 168 hours.   Radiology    CT SOFT TISSUE NECK W CONTRAST Result Date: 07/03/2024 CLINICAL DATA:  Initial evaluation for suspected epiglottitis or tonsillitis. EXAM: CT NECK WITH CONTRAST  TECHNIQUE: Multidetector CT imaging of the neck was performed using the standard protocol following the bolus administration of intravenous contrast. RADIATION DOSE REDUCTION: This exam was performed according to the departmental dose-optimization program which includes automated exposure control, adjustment of the mA and/or kV according to patient size and/or use of iterative reconstruction technique. CONTRAST:  65mL OMNIPAQUE  IOHEXOL  350 MG/ML SOLN COMPARISON:  None Available. FINDINGS: Pharynx and larynx: Patient is intubated with endotracheal and enteric tubes in place, limiting assessment. Additionally, assessment somewhat limited by streak artifact from dental amalgam. The oral tongue appears diffusely swollen, partially deviated to the right by the endotracheal to and protruding through the mouth. No visible collections or mass lesion. Finding raises the possibility for an acute inflammatory process/angioedema. The palatine tonsils themselves are difficult to visualize due to intubation, but appear grossly symmetric and within normal limits. No visible tonsillar or peritonsillar abscess. Fluid signal intensity present within the nasopharynx related to intubation. Nasopharynx otherwise unremarkable. There is suspected mucosal edema about the oropharyngeal mucosa, largely collapsed about the endotracheal tube, suspected to reflect associated supraglottitis, likely due to angio edema. Associated hazy inflammatory stranding within the parapharyngeal and submandibular spaces bilaterally. Glottis grossly symmetric inferiorly. Subglottic airway clear. Endotracheal and enteric tubes appear appropriately position. Salivary glands: Salivary glands including the parotid and submandibular glands are within normal limits. Thyroid : Normal. Lymph nodes: No enlarged or pathologic adenopathy within the neck. Vascular: Atheromatous change about the aortic arch, carotid bifurcations, and skull base. 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 Limited intracranial: Unremarkable. Visualized orbits: Unremarkable. Mastoids and visualized paranasal sinuses: Mild mucosal thickening present about the ethmoidal air cells. Paranasal sinuses are otherwise clear. Mastoid air cells and middle ear cavities are well pneumatized and free of fluid. Skeleton: No worrisome osseous lesions. Moderate spondylosis at C5-6 and C6-7. Prior sternotomy noted. Upper chest: No other acute finding. Partially visualized lungs are clear. Other: None. IMPRESSION: 1. Diffuse swelling of the oral tongue with suspected edema throughout the oropharyngeal mucosa. Findings raise the possibility for an acute inflammatory process/angioedema. The oropharyngeal airway is collapsed around the endotracheal tube. No visible collections or mass lesion. 2. Endotracheal and enteric tubes in satisfactory position. 3. Occlusion of the left vertebral artery at its origin. 4.  Aortic Atherosclerosis (  ICD10-I70.0). Electronically Signed   By: Morene Hoard M.D.   On: 07/03/2024 21:45   DG Chest Port 1 View Result Date: 07/03/2024 CLINICAL DATA:  Respiratory failure.  Orogastric tube placement. EXAM: PORTABLE CHEST 1 VIEW COMPARISON:  June 10, 2024. FINDINGS: Stable cardiomediastinal silhouette. Sternotomy wires are noted. Endotracheal tube is in grossly good position. Nasogastric tube tip is seen in expected position of distal stomach. Lungs are clear. Bony thorax is unremarkable. IMPRESSION: Endotracheal and nasogastric tubes are in grossly good position. No acute cardiopulmonary disease. Electronically Signed   By: Lynwood Landy Raddle M.D.   On: 07/03/2024 17:43   CT ABDOMEN PELVIS W CONTRAST Result Date: 07/02/2024 CLINICAL DATA:  Lower abdominal pain, melena EXAM: CT ABDOMEN AND PELVIS WITH CONTRAST TECHNIQUE: Multidetector CT imaging of the abdomen and pelvis was performed using the standard protocol following bolus  administration of intravenous contrast. RADIATION DOSE REDUCTION: This exam was performed according to the departmental dose-optimization program which includes automated exposure control, adjustment of the mA and/or kV according to patient size and/or use of iterative reconstruction technique. CONTRAST:  75mL OMNIPAQUE  IOHEXOL  350 MG/ML SOLN COMPARISON:  None Available. FINDINGS: Lower chest: Cardiomegaly. Status post. Dependent atelectasis in both lower lobes. No acute process. Hepatobiliary: Normal hepatic contour and morphology. No discrete hepatic lesions. Normal appearance of the gallbladder. No intra or extrahepatic biliary ductal dilatation. Pancreas: Unremarkable. No pancreatic ductal dilatation or surrounding inflammatory changes. Spleen: Normal in size without focal abnormality. Adrenals/Urinary Tract: Normal adrenal glands. No hydronephrosis, nephrolithiasis or enhancing renal mass. 1.5 cm simple cyst in the interpolar left kidney. No imaging follow-up is recommended. Ureters and bladder are unremarkable. Stomach/Bowel: Diffuse submucosal edema of the rectum and sigmoid colon in a region with multiple small diverticula. Inflammatory stranding present adjacent to the involved sigmoid colon and rectum. Findings are most consistent with acute diverticulitis and proctitis. No evidence of free air or abscess. No obstruction. Vascular/Lymphatic: Extensive calcified atherosclerotic plaque throughout the abdominal aorta. Fusiform aneurysmal dilation of the infrarenal abdominal aorta with a maximal diameter of 2.8 cm. 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 Severe calcified plaque results in high-grade stenosis of the left common iliac artery. Probable occlusion of both hypogastric arteries. No suspicious lymphadenopathy. Reproductive: Uterus and bilateral adnexa are unremarkable. Other: No abdominal wall hernia or abnormality. No abdominopelvic ascites. Musculoskeletal: No acute fracture or  aggressive appearing lytic or blastic osseous lesion. IMPRESSION: 1. CT findings are consistent with acute uncomplicated sigmoid diverticulitis with additional inflammatory changes extending to the upper rectum. No evidence of perforation or abscess. 2. Fusiform infrarenal abdominal aortic aneurysm with a maximal diameter of 2.8 cm. Recommend follow-up ultrasound every 5 years. (Ref.: J Vasc Surg. 2018; 67:2-77 and J Am Coll Radiol 2013;10(10):789-794.) 3. 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 4. Cardiomegaly. 5. Additional ancillary findings as above. Electronically Signed   By: Wilkie Lent M.D.   On: 07/02/2024 13:26    Cardiac Studies   Echo completed   Patient Profile     73 y.o. female with history of hypertension, hyperlipidemia, type 2 diabetes, chronic kidney disease stage III, lupus, CAD status post CABG now with respiratory failure  Assessment & Plan    Acute respiratory failure for airway protection in the setting of angioedema CAD s/p CABG  Clinical picture does not suggest a acute myocardial injury. Echo reviewed at her bedside no evidence of wall motion abnormalities. No plan for  further ischemic evaluation. If clinical picture changes this will be reassessed.  She is being titrated of gradually off the levophed .   Defer to CCM team for angioedema and plans for extubation.     For questions or updates, please contact [facility] Please consult [email or web address] for contact info under Cardiology/STEMI.      Signed, Richardson Dubree, DO  07/04/2024, 8:12 AM

## 2024-07-04 NOTE — Progress Notes (Signed)
 73 yo female history of CAD with prior CABG in 08/2024 [facility]- LAD, SVG-OM, SVG-PDA, HTN, DM2, SLE, admitted yesterday with abdominal pain and diarrhea,issues with hypoglycemia. Admitted for diverticulitis by primary team. This afternoon acute respiratory distress in setting of tongue and throat swelling. Intuated by CCM for angioedema, respiratory distress. From notes EKG changes post intubation, cardiology consulted.    EKG reviewed by me and day team that noted isolated ST elevations in V1 V2 upsloping without reciprocal changes, nonischemic pattern.  More consistent with acute injury.    CT demonstrating diffuse swelling of the oral tongue and suspected edema throughout the oropharyngeal mucosa, this is not consistent with a primary cardiac event.  Evaluated by cardiology consult team who felt like troponin elevation was secondary to demand ischemia.  Given the severe nature of the angioedema leading to respiratory failure intubation and marked hypertensive episode it is not unsurprising with known coronary artery disease and prior CABG for troponin elevation.   Called by PPM given troponin from [phone].  Patient remains intubated and hemodynamically stable. Exam is unremarkable, extremities warm, rate regular, no murmurs.   Agree with day team recommendations, can obtain transthoracic echo tomorrow to evaluate any wall motion abnormalities, even so she is at risk for stress induced cardiomyopathy.    Recommendations: -Would not treat for type I acute coronary syndrome -TTE tomorrow -Continue serial EKG  Jasmine Keith. Vonda, MD

## 2024-07-04 NOTE — Progress Notes (Signed)
 eLink Physician-Brief Progress Note Patient Name: BILLIE TRAGER DOB: 1951-08-27 MRN: [id number]   Date of Service  07/04/2024  HPI/Events of Note  Temp 95.3  eICU Interventions  Bair Hugger ordered.        Ryana Montecalvo U Dishawn Bhargava 07/04/2024, 4:28 AM

## 2024-07-04 NOTE — Progress Notes (Signed)
 Initial Nutrition Assessment  DOCUMENTATION CODES:  Non-severe (moderate) malnutrition in context of chronic illness  INTERVENTION:  Initiate tube feeding via OGT: Osmolite 1.2 at 55 ml/h (1320 ml per day) Start at 25 and advance by 10mL every 12 hours to reach goal Provides 1584 kcal, 73 gm protein, 1082 ml free water daily Pt is at risk for refeeding syndrome given ongoing poor PO intake and weight loss. Monitor magnesium  and phosphorus daily x 2 days, MD to replete as needed. 100mg  thiamine  x 5 days  NUTRITION DIAGNOSIS:  Moderate Malnutrition (in the context of chronic illness) related to decreased appetite as evidenced by mild fat depletion, severe muscle depletion, percent weight loss (9.8% x 5 months).  GOAL:  Patient will meet greater than or equal to 90% of their needs  MONITOR:  TF tolerance, I & O's, Vent status, Labs, Weight trends  REASON FOR ASSESSMENT:  Ventilator    ASSESSMENT:  Pt with hx of CAD s/p CABGx3, DM type 2, GERD, CKD3a, HLD, and HTN, presented to ED with severe diarrhea ongoing for several days.  8/27 - presented to ED  8/28 - tongue swelling, intubated for airway protection, transferred to ICU   Patient is currently intubated on ventilator support. No family at bedside to provide a hx. Pt with significant muscle and fat deficits on exam. Reviewed hx and pt has reported fatigue and poor appetite for months at MD office visits. Significant weight loss noted in the last 5 months.  Pt discussed during ICU rounds and with RN and MD. Manuelita to start nutrition today. Will initiate and advance slowly as pt will be at risk for refeeding due to malnourished state..  No plans to extubate today, swelling still present.  MV: 8.5 L/min Temp (24hrs), Avg:97.6 F (36.4 C), Min:95.3 F (35.2 C), Max:98.6 F (37 C) MAP (cuff):  Propofol : 5.66 ml/hr (149 kcal/d)   Intake/Output Summary (Last 24 hours) at 07/04/2024 1318 Last data filed at 07/04/2024  1100 Gross per 24 hour  Intake 1715.87 ml  Output 0 ml  Net 1715.87 ml  Net IO Since Admission: 2,956.42 mL [07/04/24 1318]  Drains/Lines: UOP 0mL x since admission  Admit weight: 47.2 kg  Current weight: 47.4 kg   9.8% weight loss noted since 4/1 PCP visit (~5 months) which is significant  Nutritionally Relevant Medications: Scheduled Meds:  dicyclomine   10 mg Per Tube TID AC & HS   docusate  100 mg Per Tube BID   EPINEPHrine   0.3 mg Intramuscular Once   insulin  aspart  2-6 Units Subcutaneous Q4H   levothyroxine   25 mcg Per Tube Q0600   methylPREDNISolone    80 mg Intravenous Q12H   pantoprazole  IV  40 mg Intravenous QHS   polyethylene glycol  17 g Per Tube Daily   Continuous Infusions:  metronidazole  Stopped (07/03/24 2337)   norepinephrine  (LEVOPHED ) Adult infusion 3 mcg/min (07/04/24 0700)   propofol  (DIPRIVAN ) infusion 20 mcg/kg/min (07/04/24 0700)   PRN Meds: phenol  Labs Reviewed: Sodium 134 BUN 28, creatinine 1.93 CBG ranges from 63-225 mg/dL over the last 24 hours HgbA1c 7.9%  NUTRITION - FOCUSED PHYSICAL EXAM: Flowsheet Row Most Recent Value  Orbital Region Mild depletion  Upper Arm Region Moderate depletion  Thoracic and Lumbar Region Mild depletion  Buccal Region Mild depletion  Temple Region No depletion  Clavicle Bone Region Moderate depletion  Clavicle and Acromion Bone Region Severe depletion  Scapular Bone Region Severe depletion  Dorsal Hand Unable to assess  Patellar Region Severe  depletion  Anterior Thigh Region Severe depletion  Posterior Calf Region Severe depletion  Edema (RD Assessment) None  Hair Reviewed  Eyes Unable to assess  Mouth Reviewed  Skin Reviewed  Nails Reviewed    Diet Order:   Diet Order             Diet NPO time specified  Diet effective now                   EDUCATION NEEDS:  Not appropriate for education at this time  Skin:  Skin Assessment: Reviewed RN Assessment  Last BM:  8/28  Height:  Ht  Readings from Last 1 Encounters:  07/04/24 5' 3 (1.6 m)    Weight:  Wt Readings from Last 1 Encounters:  07/03/24 47.4 kg    Ideal Body Weight:  52.3 kg  BMI:  Body mass index is 18.51 kg/m.  Estimated Nutritional Needs:  Kcal:  1400-1600 kcal/d Protein:  70-95 g/d Fluid:  >1.5L/d    Jasmine Keith, RD, LDN, CNSC Registered Dietitian II Please reach out via secure chat

## 2024-07-04 NOTE — Progress Notes (Signed)
 eLink Physician-Brief Progress Note Patient Name: Jasmine Keith DOB: 02/03/1951 MRN: [id number]   Date of Service  07/04/2024  HPI/Events of Note  Patient with sub-optimal sedation on the ventilator.  eICU Interventions  Fentanyl  gtt ordered.     Intervention Category Intermediate Interventions: Other:  Lacy Sofia U Faryn Sieg 07/04/2024, 5:36 AM

## 2024-07-04 NOTE — TOC CM/SW Note (Addendum)
 Transition of Care [facility]) - Inpatient Brief Assessment   Patient Details  Name: Jasmine Keith MRN: [id number] Date of Birth: September 29, 1951  [facility] CM/SW Contact:    Lauraine FORBES Saa, LCSWA Phone Number: 07/04/2024, 10:05 AM   Clinical Narrative:  10:05 AM Per chart review, patient resides at home with spouse. Patient has a PCP and insurance. Patient has SNF history with [facility]. Patient has HH history with [facility] and [facility]. Patient's preferred pharmacy's are [facility] and [facility] [phone] [place]. CSW provided SDOH (social connections) resources. No other TOC needs were identified at this time. TOC will continue to follow and be available to assist.  Transition of Care Asessment: Insurance and Status: Insurance coverage has been reviewed Patient has primary care physician: Yes Home environment has been reviewed: Private Residence Prior level of function:: N/A Prior/Current Home Services: No current home services Social Drivers of Health Review: SDOH reviewed interventions complete Readmission risk has been reviewed: Yes (Currently Observation Status) Transition of care needs: no transition of care needs at this time

## 2024-07-04 NOTE — Progress Notes (Signed)
 eLink Physician-Brief Progress Note Patient Name: Jasmine Keith DOB: Jun 14, 1951 MRN: [id number]   Date of Service  07/04/2024  HPI/Events of Note  Patient with soft blood pressures.  eICU Interventions  Peripheral Levophed  gtt ordered.        [name] 07/04/2024, 3:10 AM

## 2024-07-04 NOTE — Plan of Care (Signed)
   Problem: Clinical Measurements: Goal: Ability to maintain clinical measurements within normal limits will improve Outcome: Progressing

## 2024-07-04 NOTE — Progress Notes (Signed)
 eLink Physician-Brief Progress Note Patient Name: Jasmine Keith DOB: 28-May-1951 MRN: [id number]   Date of Service  07/04/2024  HPI/Events of Note  Interval elevation of Troponin noted .  eICU Interventions  Will defer to cardiology regarding next steps. I spoke with the in-house cardiologist who will  determine next steps in consultation with Dr. Alvan.        [name] U [name] 07/04/2024, 12:56 AM

## 2024-07-05 DIAGNOSIS — J9600 Acute respiratory failure, unspecified whether with hypoxia or hypercapnia: Secondary | ICD-10-CM | POA: Diagnosis not present

## 2024-07-05 DIAGNOSIS — J9601 Acute respiratory failure with hypoxia: Secondary | ICD-10-CM | POA: Diagnosis not present

## 2024-07-05 DIAGNOSIS — E440 Moderate protein-calorie malnutrition: Secondary | ICD-10-CM | POA: Insufficient documentation

## 2024-07-05 DIAGNOSIS — K5792 Diverticulitis of intestine, part unspecified, without perforation or abscess without bleeding: Secondary | ICD-10-CM | POA: Diagnosis not present

## 2024-07-05 DIAGNOSIS — T783XXD Angioneurotic edema, subsequent encounter: Secondary | ICD-10-CM | POA: Diagnosis not present

## 2024-07-05 DIAGNOSIS — I2510 Atherosclerotic heart disease of native coronary artery without angina pectoris: Secondary | ICD-10-CM | POA: Diagnosis not present

## 2024-07-05 LAB — CBC WITH DIFFERENTIAL/PLATELET
Abs Immature Granulocytes: 0.05 K/uL (ref 0.00–0.07)
Basophils Absolute: 0 K/uL (ref 0.0–0.1)
Basophils Relative: 0 %
Eosinophils Absolute: 0 K/uL (ref 0.0–0.5)
Eosinophils Relative: 0 %
HCT: 26.8 % — ABNORMAL LOW (ref 36.0–46.0)
Hemoglobin: 8.4 g/dL — ABNORMAL LOW (ref 12.0–15.0)
Immature Granulocytes: 1 %
Lymphocytes Relative: 6 %
Lymphs Abs: 0.3 K/uL — ABNORMAL LOW (ref 0.7–4.0)
MCH: 29 pg (ref 26.0–34.0)
MCHC: 31.3 g/dL (ref 30.0–36.0)
MCV: 92.4 fL (ref 80.0–100.0)
Monocytes Absolute: 0.2 K/uL (ref 0.1–1.0)
Monocytes Relative: 4 %
Neutro Abs: 4.5 K/uL (ref 1.7–7.7)
Neutrophils Relative %: 89 %
Platelets: 218 K/uL (ref 150–400)
RBC: 2.9 MIL/uL — ABNORMAL LOW (ref 3.87–5.11)
RDW: 14.7 % (ref 11.5–15.5)
WBC: 5.1 K/uL (ref 4.0–10.5)
nRBC: 0 % (ref 0.0–0.2)

## 2024-07-05 LAB — COMPREHENSIVE METABOLIC PANEL WITH GFR
ALT: 9 U/L (ref 0–44)
AST: 24 U/L (ref 15–41)
Albumin: 2.2 g/dL — ABNORMAL LOW (ref 3.5–5.0)
Alkaline Phosphatase: 34 U/L — ABNORMAL LOW (ref 38–126)
Anion gap: 14 (ref 5–15)
BUN: 47 mg/dL — ABNORMAL HIGH (ref 8–23)
CO2: 13 mmol/L — ABNORMAL LOW (ref 22–32)
Calcium: 7.9 mg/dL — ABNORMAL LOW (ref 8.9–10.3)
Chloride: 106 mmol/L (ref 98–111)
Creatinine, Ser: 2.95 mg/dL — ABNORMAL HIGH (ref 0.44–1.00)
GFR, Estimated: 16 mL/min — ABNORMAL LOW (ref >60)
Glucose, Bld: 226 mg/dL — ABNORMAL HIGH (ref 70–99)
Potassium: 4.4 mmol/L (ref 3.5–5.1)
Sodium: 133 mmol/L — ABNORMAL LOW (ref 135–145)
Total Bilirubin: 0.6 mg/dL (ref 0.0–1.2)
Total Protein: 5.3 g/dL — ABNORMAL LOW (ref 6.5–8.1)

## 2024-07-05 LAB — BASIC METABOLIC PANEL WITH GFR
Anion gap: 11 (ref 5–15)
BUN: 54 mg/dL — ABNORMAL HIGH (ref 8–23)
CO2: 17 mmol/L — ABNORMAL LOW (ref 22–32)
Calcium: 8.1 mg/dL — ABNORMAL LOW (ref 8.9–10.3)
Chloride: 107 mmol/L (ref 98–111)
Creatinine, Ser: 2.75 mg/dL — ABNORMAL HIGH (ref 0.44–1.00)
GFR, Estimated: 18 mL/min — ABNORMAL LOW (ref >60)
Glucose, Bld: 178 mg/dL — ABNORMAL HIGH (ref 70–99)
Potassium: 4.4 mmol/L (ref 3.5–5.1)
Sodium: 135 mmol/L (ref 135–145)

## 2024-07-05 LAB — GLUCOSE, CAPILLARY
Glucose-Capillary: 221 mg/dL — ABNORMAL HIGH (ref 70–99)
Glucose-Capillary: 199 mg/dL — ABNORMAL HIGH (ref 70–99)
Glucose-Capillary: 195 mg/dL — ABNORMAL HIGH (ref 70–99)
Glucose-Capillary: 170 mg/dL — ABNORMAL HIGH (ref 70–99)
Glucose-Capillary: 207 mg/dL — ABNORMAL HIGH (ref 70–99)

## 2024-07-05 LAB — LACTIC ACID, PLASMA: Lactic Acid, Venous: 1.2 mmol/L (ref 0.5–1.9)

## 2024-07-05 LAB — MAGNESIUM: Magnesium: 3 mg/dL — ABNORMAL HIGH (ref 1.7–2.4)

## 2024-07-05 LAB — PHOSPHORUS: Phosphorus: 7.7 mg/dL — ABNORMAL HIGH (ref 2.5–4.6)

## 2024-07-05 LAB — TRYPTASE: Tryptase: 8 ug/L (ref 2.2–13.2)

## 2024-07-05 MED ORDER — VENLAFAXINE HCL 37.5 MG PO TABS
37.5000 mg | ORAL_TABLET | Freq: Two times a day (BID) | ORAL | Status: DC
  Administered 2024-07-05 – 2024-07-09 (×8): 37.5 mg via ORAL
  Filled 2024-07-05 (×9): qty 1

## 2024-07-05 MED ORDER — DICYCLOMINE HCL 10 MG PO CAPS
10.0000 mg | ORAL_CAPSULE | Freq: Three times a day (TID) | ORAL | Status: DC
  Administered 2024-07-05 – 2024-07-09 (×15): 10 mg via ORAL
  Filled 2024-07-05 (×15): qty 1

## 2024-07-05 MED ORDER — LORATADINE 10 MG PO TABS
10.0000 mg | ORAL_TABLET | Freq: Every day | ORAL | Status: DC
  Administered 2024-07-06 – 2024-07-09 (×4): 10 mg via ORAL
  Filled 2024-07-05 (×4): qty 1

## 2024-07-05 MED ORDER — THIAMINE MONONITRATE 100 MG PO TABS
100.0000 mg | ORAL_TABLET | Freq: Every day | ORAL | Status: AC
  Administered 2024-07-06 – 2024-07-08 (×3): 100 mg via ORAL
  Filled 2024-07-05 (×3): qty 1

## 2024-07-05 MED ORDER — GABAPENTIN 300 MG PO CAPS: 300.0000 mg | ORAL_CAPSULE | Freq: Every day | ORAL | Status: DC

## 2024-07-05 MED ORDER — METHYLPREDNISOLONE SODIUM SUCC 40 MG IJ SOLR
40.0000 mg | Freq: Every day | INTRAMUSCULAR | Status: DC
  Administered 2024-07-06 – 2024-07-08 (×3): 40 mg via INTRAVENOUS
  Filled 2024-07-05 (×3): qty 1

## 2024-07-05 MED ORDER — INSULIN ASPART 100 UNIT/ML IJ SOLN
0.0000 [IU] | INTRAMUSCULAR | Status: DC
  Administered 2024-07-05: 4 [IU] via SUBCUTANEOUS

## 2024-07-05 MED ORDER — ORAL CARE MOUTH RINSE
15.0000 mL | OROMUCOSAL | Status: DC
  Administered 2024-07-05: 15 mL via OROMUCOSAL

## 2024-07-05 MED ORDER — LEVOTHYROXINE SODIUM 25 MCG PO TABS
25.0000 ug | ORAL_TABLET | Freq: Every day | ORAL | Status: DC
  Administered 2024-07-06 – 2024-07-09 (×4): 25 ug via ORAL
  Filled 2024-07-05 (×4): qty 1

## 2024-07-05 MED ORDER — GABAPENTIN 300 MG PO CAPS
300.0000 mg | ORAL_CAPSULE | Freq: Every day | ORAL | Status: DC
  Administered 2024-07-06 – 2024-07-09 (×4): 300 mg via ORAL
  Filled 2024-07-05 (×4): qty 1

## 2024-07-05 NOTE — Progress Notes (Signed)
 Progress Note  Patient Name: Jasmine Keith Date of Encounter: 07/05/2024  Primary Cardiologist: Jennifer JONELLE Crape, MD   Subjective   Patient seen and examined at her bedside, She is still intubated- awake and alert.   Inpatient Medications    Scheduled Meds:  Chlorhexidine  Gluconate Cloth  6 each Topical Daily   dicyclomine   10 mg Per Tube TID AC & HS   diphenhydrAMINE   25 mg Intravenous Q6H   docusate  100 mg Per Tube BID   gabapentin   300 mg Per Tube BID   heparin  injection (subcutaneous)  5,000 Units Subcutaneous Q8H   insulin  aspart  2-6 Units Subcutaneous Q4H   levothyroxine   25 mcg Per Tube Q0600   loratadine   10 mg Per Tube Daily   methylPREDNISolone  (SOLU-MEDROL ) injection  80 mg Intravenous Q12H   mouth rinse  15 mL Mouth Rinse Q2H   polyethylene glycol  17 g Per Tube Daily   thiamine   100 mg Per Tube Daily   venlafaxine   37.5 mg Per Tube BID   Continuous Infusions:  aztreonam  Stopped (07/05/24 0658)   famotidine  (PEPCID ) IV 20 mg (07/05/24 0829)   feeding supplement (OSMOLITE 1.2 CAL) 35 mL/hr at 07/05/24 0800   fentaNYL  infusion INTRAVENOUS Stopped (07/05/24 0758)   metronidazole  Stopped (07/04/24 2337)   norepinephrine  (LEVOPHED ) Adult infusion Stopped (07/04/24 2118)   propofol  (DIPRIVAN ) infusion 20 mcg/kg/min (07/05/24 0800)   PRN Meds: acetaminophen , albuterol , EPINEPHrine , fentaNYL , mouth rinse, phenol   Vital Signs    Vitals:   07/05/24 0744 07/05/24 0800 07/05/24 0900 07/05/24 0915  BP:  (!) 119/54 (!) 123/53 (!) 153/81  Pulse: (!) 53 61 65 94  Resp: 20 11 18 12   Temp:      TempSrc:      SpO2: 99% 99% 99% 100%  Weight:      Height:        Intake/Output Summary (Last 24 hours) at 07/05/2024 0959 Last data filed at 07/05/2024 0800 Gross per 24 hour  Intake 2333.76 ml  Output 0 ml  Net 2333.76 ml   Filed Weights   07/02/24 1030 07/03/24 1705 07/05/24 0500  Weight: 47.2 kg 47.4 kg 49.9 kg    Telemetry    Sinus rhythm  -  Personally Reviewed  ECG    None today - EKG from yesterday reviewed  - Personally Reviewed  Physical Exam     General: intubated Head: Atraumatic, normal size  Eyes: PEERLA, EOMI  Neck: Supple, normal JVD Cardiac: Normal S1, S2; RRR; no murmurs, rubs, or gallops Lungs: Clear to auscultation bilaterally Abd: Soft, nontender, no hepatomegaly  Ext: warm, no edema Musculoskeletal: No deformities, BUE and BLE strength normal and equal Skin: Warm and dry, no rashes   Neuro: Alert and oriented to person, place, time, and situation, CNII-XII grossly intact, no focal deficits  Psych: Normal mood and affect   Labs    Chemistry Recent Labs  Lab 07/03/24 1825 07/04/24 0330 07/04/24 1008 07/05/24 0328  NA 134* 134* 135 133*  K 3.8 3.9 4.0 4.4  CL 105 105 102 106  CO2 19* 17* 18* 13*  GLUCOSE 223* 158* 151* 226*  BUN 24* 28* 34* 47*  CREATININE 1.65* 1.93* 2.40* 2.95*  CALCIUM  8.5* 8.5* 8.5* 7.9*  PROT 6.3* 5.7*  --  5.3*  ALBUMIN  2.8* 2.4*  --  2.2*  AST 31 30  --  24  ALT 9 9  --  9  ALKPHOS 40 30*  --  34*  BILITOT 0.9 0.8  --  0.6  GFRNONAA 33* 27* 21* 16*  ANIONGAP 10 12 15 14      Hematology Recent Labs  Lab 07/03/24 1825 07/04/24 0330 07/05/24 0328  WBC 3.3* 2.8* 5.1  RBC 4.11 3.71* 2.90*  HGB 11.6* 10.3* 8.4*  HCT 34.7* 31.1* 26.8*  MCV 84.4 83.8 92.4  MCH 28.2 27.8 29.0  MCHC 33.4 33.1 31.3  RDW 14.1 14.2 14.7  PLT 268 218 218    Cardiac EnzymesNo results for input(s): TROPONINI in the last 168 hours. No results for input(s): TROPIPOC in the last 168 hours.   BNPNo results for input(s): BNP, PROBNP in the last 168 hours.   DDimer No results for input(s): DDIMER in the last 168 hours.   Radiology    ECHOCARDIOGRAM COMPLETE Result Date: 07/04/2024    ECHOCARDIOGRAM REPORT   Patient Name:   Jasmine Keith Date of Exam: 07/04/2024 Medical Rec #:  [id number]          Height:       63.0 in Accession #:    [id number]         Weight:        104.5 lb Date of Birth:  12-Oct-1951         BSA:          1.467 m Patient Age:    72 years           BP:           132/51 mmHg Patient Gender: F                  HR:           62 bpm. Exam Location:  Inpatient Procedure: 2D Echo, Cardiac Doppler and Color Doppler (Both Spectral and Color            Flow Doppler were utilized during procedure). Indications:    Dyspnea R06.00  History:        Patient has prior history of Echocardiogram examinations, most                 recent 09/22/2022. Prior CABG; Risk Factors:Diabetes and                 Hypertension.  Sonographer:    [name] Referring Phys: [id number] KHABIB DGAYLI IMPRESSIONS  1. Left ventricular ejection fraction, by estimation, is 70 to 75%. Left ventricular ejection fraction by PLAX is 71 %. The left ventricle has hyperdynamic function. The left ventricle has no regional wall motion abnormalities. There is moderate left ventricular hypertrophy. Left ventricular diastolic parameters are consistent with Grade I diastolic dysfunction (impaired relaxation).  2. Right ventricular systolic function is normal. The right ventricular size is normal. Tricuspid regurgitation signal is inadequate for assessing PA pressure.  3. The mitral valve is grossly normal. Trivial mitral valve regurgitation.  4. The aortic valve is tricuspid. Aortic valve regurgitation is not visualized.  5. The inferior vena cava is normal in size with greater than 50% respiratory variability, suggesting right atrial pressure of 3 mmHg. Comparison(s): Changes from prior study are noted. 09/22/2022: LVEF >75%, RVSP 25.3 mmHg. FINDINGS  Left Ventricle: Left ventricular ejection fraction, by estimation, is 70 to 75%. Left ventricular ejection fraction by PLAX is 71 %. The left ventricle has hyperdynamic function. The left ventricle has no regional wall motion abnormalities. The left ventricular internal cavity size was normal in size. There is moderate left ventricular hypertrophy. Left  ventricular diastolic parameters  are consistent with Grade I diastolic dysfunction (impaired relaxation). Indeterminate filling pressures. Right Ventricle: The right ventricular size is normal. No increase in right ventricular wall thickness. Right ventricular systolic function is normal. Tricuspid regurgitation signal is inadequate for assessing PA pressure. Left Atrium: Left atrial size was normal in size. Right Atrium: Right atrial size was normal in size. Pericardium: There is no evidence of pericardial effusion. Mitral Valve: The mitral valve is grossly normal. Trivial mitral valve regurgitation. Tricuspid Valve: The tricuspid valve is grossly normal. Tricuspid valve regurgitation is trivial. Aortic Valve: The aortic valve is tricuspid. Aortic valve regurgitation is not visualized. Aortic valve mean gradient measures 3.0 mmHg. Aortic valve peak gradient measures 5.3 mmHg. Aortic valve area, by VTI measures 2.14 cm. Pulmonic Valve: The pulmonic valve was normal in structure. Pulmonic valve regurgitation is not visualized. Aorta: The aortic root and ascending aorta are structurally normal, with no evidence of dilitation. Venous: The inferior vena cava is normal in size with greater than 50% respiratory variability, suggesting right atrial pressure of 3 mmHg. IAS/Shunts: No atrial level shunt detected by color flow Doppler.  LEFT VENTRICLE PLAX 2D LV EF:         Left            Diastology                ventricular     LV e' medial:    7.07 cm/s                ejection        LV E/e' medial:  12.3                fraction by     LV e' lateral:   11.40 cm/s                PLAX is 71      LV E/e' lateral: 7.6                %. LVIDd:         3.10 cm LVIDs:         1.90 cm LV PW:         1.30 cm LV IVS:        1.40 cm LVOT diam:     1.70 cm LV SV:         55 LV SV Index:   37 LVOT Area:     2.27 cm  LEFT ATRIUM             Index        RIGHT ATRIUM           Index LA Vol (A2C):   39.2 ml 26.71 ml/m  RA Area:      10.00 cm LA Vol (A4C):   27.7 ml 18.88 ml/m  RA Volume:   16.70 ml  11.38 ml/m LA Biplane Vol: 34.7 ml 23.65 ml/m  AORTIC VALVE AV Area (Vmax):    2.15 cm AV Area (Vmean):   2.12 cm AV Area (VTI):     2.14 cm AV Vmax:           115.00 cm/s AV Vmean:          81.100 cm/s AV VTI:            0.256 m AV Peak Grad:      5.3 mmHg AV Mean Grad:      3.0 mmHg LVOT Vmax:  109.00 cm/s LVOT Vmean:        75.600 cm/s LVOT VTI:          0.241 m LVOT/AV VTI ratio: 0.94  AORTA Ao Root diam: 2.70 cm MITRAL VALVE MV Area (PHT): 2.26 cm     SHUNTS MV Decel Time: 335 msec     Systemic VTI:  0.24 m MV E velocity: 87.00 cm/s   Systemic Diam: 1.70 cm MV A velocity: 109.00 cm/s MV E/A ratio:  0.80 Vinie Maxcy MD Electronically signed by Vinie Maxcy MD Signature Date/Time: 07/04/2024/12:22:44 PM    Final    CT SOFT TISSUE NECK W CONTRAST Result Date: 07/03/2024 CLINICAL DATA:  Initial evaluation for suspected epiglottitis or tonsillitis. EXAM: CT NECK WITH CONTRAST TECHNIQUE: Multidetector CT imaging of the neck was performed using the standard protocol following the bolus administration of intravenous contrast. RADIATION DOSE REDUCTION: This exam was performed according to the departmental dose-optimization program which includes automated exposure control, adjustment of the mA and/or kV according to patient size and/or use of iterative reconstruction technique. CONTRAST:  65mL OMNIPAQUE  IOHEXOL  350 MG/ML SOLN COMPARISON:  None Available. FINDINGS: Pharynx and larynx: Patient is intubated with endotracheal and enteric tubes in place, limiting assessment. Additionally, assessment somewhat limited by streak artifact from dental amalgam. The oral tongue appears diffusely swollen, partially deviated to the right by the endotracheal to and protruding through the mouth. No visible collections or mass lesion. Finding raises the possibility for an acute inflammatory process/angioedema. The palatine tonsils themselves are  difficult to visualize due to intubation, but appear grossly symmetric and within normal limits. No visible tonsillar or peritonsillar abscess. Fluid signal intensity present within the nasopharynx related to intubation. Nasopharynx otherwise unremarkable. There is suspected mucosal edema about the oropharyngeal mucosa, largely collapsed about the endotracheal tube, suspected to reflect associated supraglottitis, likely due to angio edema. Associated hazy inflammatory stranding within the parapharyngeal and submandibular spaces bilaterally. Glottis grossly symmetric inferiorly. Subglottic airway clear. Endotracheal and enteric tubes appear appropriately position. Salivary glands: Salivary glands including the parotid and submandibular glands are within normal limits. Thyroid : Normal. Lymph nodes: No enlarged or pathologic adenopathy within the neck. Vascular: Atheromatous change about the aortic arch, carotid bifurcations, and skull base. 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 Limited intracranial: Unremarkable. Visualized orbits: Unremarkable. Mastoids and visualized paranasal sinuses: Mild mucosal thickening present about the ethmoidal air cells. Paranasal sinuses are otherwise clear. Mastoid air cells and middle ear cavities are well pneumatized and free of fluid. Skeleton: No worrisome osseous lesions. Moderate spondylosis at C5-6 and C6-7. Prior sternotomy noted. Upper chest: No other acute finding. Partially visualized lungs are clear. Other: None. IMPRESSION: 1. Diffuse swelling of the oral tongue with suspected edema throughout the oropharyngeal mucosa. Findings raise the possibility for an acute inflammatory process/angioedema. The oropharyngeal airway is collapsed around the endotracheal tube. No visible collections or mass lesion. 2. Endotracheal and enteric tubes in satisfactory position. 3. Occlusion of the left vertebral artery at its origin. 4.   Aortic Atherosclerosis (ICD10-I70.0). Electronically Signed   By: Morene Hoard M.D.   On: 07/03/2024 21:45   DG Chest Port 1 View Result Date: 07/03/2024 CLINICAL DATA:  Respiratory failure.  Orogastric tube placement. EXAM: PORTABLE CHEST 1 VIEW COMPARISON:  June 10, 2024. FINDINGS: Stable cardiomediastinal silhouette. Sternotomy wires are noted. Endotracheal tube is in grossly good position. Nasogastric tube tip is seen in expected position of distal stomach. Lungs are clear. Bony thorax is  unremarkable. IMPRESSION: Endotracheal and nasogastric tubes are in grossly good position. No acute cardiopulmonary disease. Electronically Signed   By: Lynwood Landy Raddle M.D.   On: 07/03/2024 17:43    Cardiac Studies   Echo completed   Patient Profile     73 y.o. female with history of hypertension, hyperlipidemia, type 2 diabetes, chronic kidney disease stage III, lupus, CAD status post CABG now with respiratory failure  Assessment & Plan    Acute respiratory failure for airway protection in the setting of angioedema CAD s/p CABG Distributive shock - per ICU team  CKD stage IV  Clinical picture does not suggest a acute myocardial injury. Echo with no wall motion abnormalities.  No plan for further ischemic evaluation. If clinical picture changes this will be reassessed.  Cr elevated- AKI likely in the setting of ATN due to transient hypotension. Avoid nephrotoxins will defer to primary team.  Off levophed , once blood pressure we will resume his bp - will defer to the ICU team  Defer to CCM team for angioedema and plans for extubation.    Please call with questions, we will sign off at this time.     Signed, Artemus Romanoff, DO  07/05/2024, 9:59 AM

## 2024-07-05 NOTE — Progress Notes (Signed)
 NAME:  Jasmine Keith, MRN:  [id number], DOB:  January 23, 1951, LOS: 1 ADMISSION DATE:  07/02/2024, CONSULTATION DATE:  07/03/2024 REFERRING MD:  Marien Piety , CHIEF COMPLAINT:  Angioedema    History of Present Illness:  73 year old female admitted to the hospital yesterday secondary to abdominal pain and diarrhea. CT abdomen/pelvis was obtained and showed diverticulitis.   Patient presented yesterday to the ED with above chief complaint. She was hypertensive and tachycardic, with CT abdomen/pelvis showing acute uncomplicated sigmoid diverticulitis. She was started on IV antibiotics and admitted for observation given infection and episode of hypoglycemia in the ED.   In the ED, she received IV antibiotics with Ceftriaxone  and Flagyl . This was switched to Augmentin  and she was admitted to the medical floor. This afternoon, she developed acute onset shortness of breath and tongue swelling. She was experiencing respiratory distress and a rapid response was called. PCCM presented to the bedside given concern for threatened airway.   On arrival, she was visibly in distress, but with no wheeze or rhonchi. She had gurgling sounds in her throat. She received 4 mg iv dexamethasone , 25 mg PO benadryl  prior to our arrival. We gave her 50 mg of IV benadryl  and 125 mg of IV methylprednisolone . She also received 100 mcg of IV epinephrine . Her swelling appeared to worsen and she became encephalopathic with decreased level of mentation. At this point, we decided to proceed with intubation to protect her airway. A 6.0 ETT was delivered to the airway with ease. Placement was confirmed with flexible bronchoscopy.  Pertinent  Medical History  -SLE on Hydroxychloroquine . -Hypothyroidism on Levothyroxine  -CAD s/p CABG -T2DM -CKD stage III -HTN on amlodipine  and carvedilol   Significant Hospital Events: Including procedures, antibiotic start and stop dates in addition to other pertinent events   8/27: admit for  diverticulitis and hypoglycemia 8/28: rapid response, threatened airway. 8/28: Intubated for airway protection.  Interim History / Subjective:  0.30+ PEEP 5 No acute issues reported Fentanyl  50, propofol  45 Norepinephrine  weaned to off I/O+ 4.9 L total, no urine output reported  Objective   Blood pressure (!) 102/49, pulse (!) 57, temperature 98 F (36.7 C), temperature source Oral, resp. rate 20, height 5' 3 (1.6 m), weight 49.9 kg, SpO2 99%.    Vent Mode: PRVC FiO2 (%):  [30 %] 30 % Set Rate:  [20 bmp] 20 bmp Vt Set:  [410 mL] 410 mL PEEP:  [5 cmH20] 5 cmH20 Plateau Pressure:  [14 cmH20-16 cmH20] 16 cmH20   Intake/Output Summary (Last 24 hours) at 07/05/2024 0705 Last data filed at 07/05/2024 0600 Gross per 24 hour  Intake 2257.73 ml  Output 0 ml  Net 2257.73 ml   Filed Weights   07/02/24 1030 07/03/24 1705 07/05/24 0500  Weight: 47.2 kg 47.4 kg 49.9 kg   Examination: General: Chronically ill-appearing woman, ventilated HENT: Tongue smaller, able to open mouth.  No significant enlargement neck or lips Lungs: Good bilateral air movement, no wheezes or crackles Cardiovascular: Regular, distant, no murmur Extremities warm, dry,, no significant edema Neuro: Waking up, nods to questions and follows commands.  Strong cough.    Resolved Hospital Problem list   None  Assessment & Plan:  73 year old female with history of SLE, CAD, T2DM, and Htn who presented to the hospital with acute diverticulitis. She developed acute respiratory distress secondary to angioedema potentially due to medication side effect. She was intubated to secure the airway and admitted to the ICU for further management.   #  Acute Hypoxic Respiratory Failure Impaired airway protection in the setting of apparent angioedema, question cause is only new medication was Augmentin .  Allergy placed - PR VC 8 cc/kg - Okay for PSV to facilitate wake up and comfort but no plans for extubation until oral exam,  tongue exam improved, evolving a cuff leak.  If no cuff leak evolves then may need to consider repeat CT neck - VAP prevention order set - Sedation as per PAD protocol  # Distributive shock, improved # Angioedema  Suspect shock due to sedation, possible contribution of anaphylaxis.  Consider component of sepsis with her diverticulitis.  Pressors off - Augmentin  discontinued - Solu-Medrol , wean to 40 mg daily 8/30 - Benadryl , Pepcid  scheduled  #CAD s/p CABG  #ST changes on EKG Troponin elevation (600 ? 1400) with EKG showing ST changes in V1-V2. Cardiology consulted; impression was that isolated upsloping ST elevations in V1-V2 without reciprocal changes consistent with demand ischemia -Troponin peak 1493 - TTE reassuring, appreciate cardiology evaluation - Amlodipine  and carvedilol  on hold for now, restart when hemodynamics will allow  #Acute uncomplicated diverticulitis -Afebrile, hemodynamically stable, no leukocytosis.  Clearly did not tolerate Augmentin , now on aztreonam  and metronidazole .   - Plan complete 7 days total antibiotics  #Acute tubular necrosis #CKD stage IV #Anion gap metabolic acidosis - Serum creatinine with progressive worsening, oliguric.  May need to place Foley catheter to better quantify - Follow BMP, urine output closely - Check lactic acid - Avoid nephrotoxic medications and renal dose medications.  #Hx of SLE History of SLE on Plaquenil .  -Plaquenil  on hold  # Hypothyroidism - Continue levothyroxine  25                 Best Practice (right click and Reselect all SmartList Selections daily)   Diet/type: NPO DVT prophylaxis: prophylactic heparin   GI prophylaxis: PPI Lines: N/A Foley:  N/A Code Status:  full code  Labs   CBC: Recent Labs  Lab 07/02/24 1029 07/03/24 1751 07/03/24 1825 07/04/24 0330 07/05/24 0328  WBC 5.3  --  3.3* 2.8* 5.1  NEUTROABS  --   --  2.9 2.3 4.5  HGB 9.9* 10.2* 11.6* 10.3* 8.4*  HCT 31.1* 30.0* 34.7* 31.1*  26.8*  MCV 90.1  --  84.4 83.8 92.4  PLT 227  --  268 218 218    Basic Metabolic Panel: Recent Labs  Lab 07/02/24 1029 07/03/24 1751 07/03/24 1825 07/04/24 0330 07/04/24 1008 07/05/24 0328  NA 133* 136 134* 134* 135 133*  K 3.5 3.7 3.8 3.9 4.0 4.4  CL 105  --  105 105 102 106  CO2 18*  --  19* 17* 18* 13*  GLUCOSE 60*  --  223* 158* 151* 226*  BUN 22  --  24* 28* 34* 47*  CREATININE 1.58*  --  1.65* 1.93* 2.40* 2.95*  CALCIUM  8.2*  --  8.5* 8.5* 8.5* 7.9*  MG  --   --  1.7  --   --  3.0*  PHOS  --   --  4.1  --   --  7.7*   GFR: Estimated Creatinine Clearance: 13.8 mL/min (A) (by C-G formula based on SCr of 2.95 mg/dL (H)). Recent Labs  Lab 07/02/24 1029 07/02/24 1302 07/02/24 1527 07/03/24 1825 07/04/24 0330 07/05/24 0328  WBC 5.3  --   --  3.3* 2.8* 5.1  LATICACIDVEN  --  0.6 1.1 1.0  --   --     Liver Function Tests: Recent Labs  Lab  07/02/24 1029 07/03/24 1825 07/04/24 0330 07/05/24 0328  AST 24 31 30 24   ALT 9 9 9 9   ALKPHOS 37* 40 30* 34*  BILITOT 0.8 0.9 0.8 0.6  PROT 6.3* 6.3* 5.7* 5.3*  ALBUMIN  2.8* 2.8* 2.4* 2.2*   Recent Labs  Lab 07/02/24 1029  LIPASE 32   No results for input(s): AMMONIA in the last 168 hours.  ABG    Component Value Date/Time   PHART 7.331 (L) 07/03/2024 1751   PCO2ART 38.7 07/03/2024 1751   PO2ART 455 (H) 07/03/2024 1751   HCO3 20.5 07/03/2024 1751   TCO2 22 07/03/2024 1751   ACIDBASEDEF 5.0 (H) 07/03/2024 1751   O2SAT 100 07/03/2024 1751     Coagulation Profile: No results for input(s): INR, PROTIME in the last 168 hours.  Cardiac Enzymes: No results for input(s): CKTOTAL, CKMB, CKMBINDEX, TROPONINI in the last 168 hours.  HbA1C: Hgb A1c MFr Bld  Date/Time Value Ref Range Status  04/18/2024 02:54 PM 7.9 (H) <5.7 % Final    Comment:    For someone without known diabetes, a hemoglobin A1c value of 6.5% or greater indicates that they may have  diabetes and this should be confirmed with a  follow-up  test. . For someone with known diabetes, a value <7% indicates  that their diabetes is well controlled and a value  greater than or equal to 7% indicates suboptimal  control. A1c targets should be individualized based on  duration of diabetes, age, comorbid conditions, and  other considerations. . Currently, no consensus exists regarding use of hemoglobin A1c for diagnosis of diabetes for children. [name]   11/14/2023 10:32 AM 9.2 (H) 4.6 - 6.5 % Final    Comment:    Glycemic Control Guidelines for People with Diabetes:Non Diabetic:  <6%Goal of Therapy: <7%Additional Action Suggested:  >8%     CBG: Recent Labs  Lab 07/04/24 1112 07/04/24 1525 07/04/24 1927 07/04/24 2322 07/05/24 0329  GLUCAP 140* 152* 153* 197* 221*      Critical care time: 33 mins    Lamar Chris, MD, PhD 07/05/2024, 7:05 AM [facility] [phone] or if no answer before 7:00PM call [phone] For any issues after 7:00PM please call eLink [phone]

## 2024-07-05 NOTE — Procedures (Signed)
 Extubation Procedure Note  Patient Details:   Name: JERITA WIMBUSH DOB: 12/30/1950 MRN: [id number]   Airway Documentation:    Vent end date: 07/05/24 Vent end time: 0915   Evaluation  O2 sats: stable throughout Complications: No apparent complications Patient did tolerate procedure well. Bilateral Breath Sounds: (P) Diminished, Rhonchi   Yes  Pt extubated to 4 lpm w/o complications. + cuff leak noted prior to extubation. No stridor noted post. Cont to monitor  Bari CHRISTELLA Daunt 07/05/2024, 9:20 AM

## 2024-07-06 DIAGNOSIS — K5792 Diverticulitis of intestine, part unspecified, without perforation or abscess without bleeding: Secondary | ICD-10-CM | POA: Diagnosis not present

## 2024-07-06 LAB — GLUCOSE, CAPILLARY
Glucose-Capillary: 106 mg/dL — ABNORMAL HIGH (ref 70–99)
Glucose-Capillary: 171 mg/dL — ABNORMAL HIGH (ref 70–99)
Glucose-Capillary: 211 mg/dL — ABNORMAL HIGH (ref 70–99)
Glucose-Capillary: 235 mg/dL — ABNORMAL HIGH (ref 70–99)
Glucose-Capillary: 295 mg/dL — ABNORMAL HIGH (ref 70–99)
Glucose-Capillary: 87 mg/dL (ref 70–99)

## 2024-07-06 LAB — COMPREHENSIVE METABOLIC PANEL WITH GFR
ALT: 12 U/L (ref 0–44)
AST: 34 U/L (ref 15–41)
Albumin: 2.5 g/dL — ABNORMAL LOW (ref 3.5–5.0)
Alkaline Phosphatase: 48 U/L (ref 38–126)
Anion gap: 12 (ref 5–15)
BUN: 52 mg/dL — ABNORMAL HIGH (ref 8–23)
CO2: 15 mmol/L — ABNORMAL LOW (ref 22–32)
Calcium: 8.5 mg/dL — ABNORMAL LOW (ref 8.9–10.3)
Chloride: 110 mmol/L (ref 98–111)
Creatinine, Ser: 2.37 mg/dL — ABNORMAL HIGH (ref 0.44–1.00)
GFR, Estimated: 21 mL/min — ABNORMAL LOW (ref >60)
Glucose, Bld: 216 mg/dL — ABNORMAL HIGH (ref 70–99)
Potassium: 4 mmol/L (ref 3.5–5.1)
Sodium: 137 mmol/L (ref 135–145)
Total Bilirubin: 0.5 mg/dL (ref 0.0–1.2)
Total Protein: 5.8 g/dL — ABNORMAL LOW (ref 6.5–8.1)

## 2024-07-06 LAB — CBC WITH DIFFERENTIAL/PLATELET
Abs Immature Granulocytes: 0.16 K/uL — ABNORMAL HIGH (ref 0.00–0.07)
Basophils Absolute: 0 K/uL (ref 0.0–0.1)
Basophils Relative: 0 %
Eosinophils Absolute: 0 K/uL (ref 0.0–0.5)
Eosinophils Relative: 0 %
HCT: 29.2 % — ABNORMAL LOW (ref 36.0–46.0)
Hemoglobin: 9.3 g/dL — ABNORMAL LOW (ref 12.0–15.0)
Immature Granulocytes: 3 %
Lymphocytes Relative: 10 %
Lymphs Abs: 0.5 K/uL — ABNORMAL LOW (ref 0.7–4.0)
MCH: 28.6 pg (ref 26.0–34.0)
MCHC: 31.8 g/dL (ref 30.0–36.0)
MCV: 89.8 fL (ref 80.0–100.0)
Monocytes Absolute: 0.2 K/uL (ref 0.1–1.0)
Monocytes Relative: 4 %
Neutro Abs: 4.4 K/uL (ref 1.7–7.7)
Neutrophils Relative %: 83 %
Platelets: 264 K/uL (ref 150–400)
RBC: 3.25 MIL/uL — ABNORMAL LOW (ref 3.87–5.11)
RDW: 14.8 % (ref 11.5–15.5)
WBC: 5.3 K/uL (ref 4.0–10.5)
nRBC: 0 % (ref 0.0–0.2)

## 2024-07-06 MED ORDER — MELATONIN 5 MG PO TABS
5.0000 mg | ORAL_TABLET | Freq: Every evening | ORAL | Status: AC | PRN
  Administered 2024-07-06 – 2024-07-07 (×2): 5 mg via ORAL
  Filled 2024-07-06 (×2): qty 1

## 2024-07-06 MED ORDER — CARVEDILOL 6.25 MG PO TABS
6.2500 mg | ORAL_TABLET | Freq: Once | ORAL | Status: AC
  Administered 2024-07-06: 6.25 mg via ORAL
  Filled 2024-07-06: qty 1

## 2024-07-06 MED ORDER — TRAZODONE HCL 50 MG PO TABS
25.0000 mg | ORAL_TABLET | Freq: Once | ORAL | Status: AC
  Administered 2024-07-06: 25 mg via ORAL
  Filled 2024-07-06: qty 1

## 2024-07-06 MED ORDER — CARVEDILOL 3.125 MG PO TABS: 3.1250 mg | ORAL_TABLET | Freq: Once | ORAL | Status: DC

## 2024-07-06 MED ORDER — FAMOTIDINE 20 MG PO TABS
20.0000 mg | ORAL_TABLET | Freq: Every day | ORAL | Status: DC
  Administered 2024-07-07 – 2024-07-09 (×3): 20 mg via ORAL
  Filled 2024-07-06 (×3): qty 1

## 2024-07-06 MED ORDER — MENTHOL 3 MG MT LOZG
1.0000 | LOZENGE | OROMUCOSAL | Status: DC | PRN
  Administered 2024-07-07 – 2024-07-09 (×2): 3 mg via ORAL
  Filled 2024-07-06 (×3): qty 9

## 2024-07-06 NOTE — Progress Notes (Signed)
 PROGRESS NOTE    Jasmine Keith  FMW:[id number] DOB: 1951-01-13 DOA: 07/02/2024 PCP: Dorina Dallas RIGGERS   Brief Narrative:   73 year old female admitted to the hospital secondary to abdominal pain and diarrhea. CT abdomen/pelvis was obtained and showed diverticulitis. In the ED, she received IV antibiotics with Ceftriaxone  and Flagyl . She was admitted to the medical floor and developed acute onset shortness of breath and tongue swelling. She was experiencing respiratory distress and a rapid response was called. PCCM was consulted and patient was intubated for airway protection. She was subsequently extubated on 8/30. [facility] assumed care on 8/31.   Assessment & Plan:  Principal Problem:   Diverticulitis Active Problems:   Uncontrolled type 2 diabetes mellitus with hypoglycemia, with long-term current use of insulin  (HCC)   Angioedema   Malnutrition of moderate degree   Acute Hypoxic Respiratory Failure S/p intubation with subsequent extubation No evidence of acute hypoxia at this point  Swollen RUE: -Ordered US  venous duplex to rule out DVT   CAD s/p CABG Troponin elevation (600 ? 1400) with EKG showing ST changes in V1-V2. Cardiology consulted -Troponin peaked at 1493 - TTE reassuring, appreciate cardiology evaluation - Amlodipine  and carvedilol  on hold for now  Distributive shock, improved Angioedema  Suspect shock due to sedation, possible contribution of anaphylaxis.  Consider component of sepsis with her diverticulitis.  Pressors off - Augmentin  discontinued - Solu-Medrol , wean to 40 mg daily 8/30 - Benadryl , Pepcid  scheduled   Acute uncomplicated diverticulitis without perforation: -Afebrile, hemodynamically stable, no leukocytosis.  Continue with aztreonam  and metronidazole .   - Plan is to complete 7 days total antibiotics   Acute tubular necrosis CKD stage IV Anion gap metabolic acidosis - Follow BMP, urine output closely - Avoid nephrotoxic medications and  renal dose medications.   Hx of SLE History of SLE on Plaquenil .  -Plaquenil  on hold   Hypothyroidism - Continue levothyroxine  25  Disposition: Lives at home with her husband and is IADL.   DVT prophylaxis: heparin  injection 5,000 Units Start: 07/04/24 0915 SCDs Start: 07/03/24 1704 SCDs Start: 07/02/24 1456     Code Status: Full Code Family Communication:  None at the bedside Status is: Inpatient Remains inpatient appropriate because: Diverticulitis, Kidney dysfunction    Subjective:  Complaining of left arm pain, it's red and swollen from multiple sticks. She told me that she was hospitalized couple of years back with C.diff infection. She lives at home with her husband and is IADL> We also spoke about her abnormal kidney function,   Examination:  General exam: Appears calm and comfortable  Respiratory system: Clear to auscultation. Respiratory effort normal. Cardiovascular system: S1 & S2 heard, RRR. No JVD, murmurs, rubs, gallops or clicks. No pedal edema. Gastrointestinal system: Abdomen is nondistended, soft and nontender. No organomegaly or masses felt. Normal bowel sounds heard. Central nervous system: Alert and oriented. No focal neurological deficits. Extremities: Symmetric 5 x 5 power. Swollen, erythematous right arm Skin: Swollen, erythematous right arm Psychiatry: Judgement and insight appear normal. Mood & affect appropriate.       Diet Orders (From admission, onward)     Start     Ordered   07/05/24 1325  Diet Carb Modified Fluid consistency: Thin; Room service appropriate? Yes  Diet effective now       Question Answer Comment  Diet-HS Snack? Nothing   Calorie Level Medium 1600-2000   Fluid consistency: Thin   Room service appropriate? Yes      07/05/24 1325  Objective: Vitals:   07/05/24 2115 07/06/24 0500 07/06/24 0506 07/06/24 0749  BP: (!) 144/53  (!) 195/100 106/77  Pulse: 80  79 86  Resp:    15  Temp: 98.1 F (36.7 C)   98.2 F (36.8 C) 97.8 F (36.6 C)  TempSrc: Oral  Oral Oral  SpO2: 100%  96% 100%  Weight:  48.4 kg    Height:        Intake/Output Summary (Last 24 hours) at 07/06/2024 0945 Last data filed at 07/06/2024 0500 Gross per 24 hour  Intake 603 ml  Output 800 ml  Net -197 ml   Filed Weights   07/03/24 1705 07/05/24 0500 07/06/24 0500  Weight: 47.4 kg 49.9 kg 48.4 kg    Scheduled Meds:  Chlorhexidine  Gluconate Cloth  6 each Topical Daily   dicyclomine   10 mg Oral TID AC & HS   diphenhydrAMINE   25 mg Intravenous Q6H   gabapentin   300 mg Oral Daily   heparin  injection (subcutaneous)  5,000 Units Subcutaneous Q8H   levothyroxine   25 mcg Oral Q0600   loratadine   10 mg Oral Daily   methylPREDNISolone  (SOLU-MEDROL ) injection  40 mg Intravenous Daily   thiamine   100 mg Oral Daily   venlafaxine   37.5 mg Oral BID   Continuous Infusions:  aztreonam  1 g (07/06/24 0603)   famotidine  (PEPCID ) IV 20 mg (07/06/24 0919)   metronidazole  500 mg (07/06/24 0921)    Nutritional status Signs/Symptoms: mild fat depletion, severe muscle depletion, percent weight loss (9.8% x 5 months) Percent weight loss: 10 % Interventions: Refer to RD note for recommendations, Tube feeding, Prostat Body mass index is 18.9 kg/m.  Data Reviewed:   CBC: Recent Labs  Lab 07/02/24 1029 07/03/24 1751 07/03/24 1825 07/04/24 0330 07/05/24 0328  WBC 5.3  --  3.3* 2.8* 5.1  NEUTROABS  --   --  2.9 2.3 4.5  HGB 9.9* 10.2* 11.6* 10.3* 8.4*  HCT 31.1* 30.0* 34.7* 31.1* 26.8*  MCV 90.1  --  84.4 83.8 92.4  PLT 227  --  268 218 218   Basic Metabolic Panel: Recent Labs  Lab 07/03/24 1825 07/04/24 0330 07/04/24 1008 07/05/24 0328 07/05/24 1850  NA 134* 134* 135 133* 135  K 3.8 3.9 4.0 4.4 4.4  CL 105 105 102 106 107  CO2 19* 17* 18* 13* 17*  GLUCOSE 223* 158* 151* 226* 178*  BUN 24* 28* 34* 47* 54*  CREATININE 1.65* 1.93* 2.40* 2.95* 2.75*  CALCIUM  8.5* 8.5* 8.5* 7.9* 8.1*  MG 1.7  --   --  3.0*  --    PHOS 4.1  --   --  7.7*  --    GFR: Estimated Creatinine Clearance: 14.3 mL/min (A) (by C-G formula based on SCr of 2.75 mg/dL (H)). Liver Function Tests: Recent Labs  Lab 07/02/24 1029 07/03/24 1825 07/04/24 0330 07/05/24 0328  AST 24 31 30 24   ALT 9 9 9 9   ALKPHOS 37* 40 30* 34*  BILITOT 0.8 0.9 0.8 0.6  PROT 6.3* 6.3* 5.7* 5.3*  ALBUMIN  2.8* 2.8* 2.4* 2.2*   Recent Labs  Lab 07/02/24 1029  LIPASE 32   No results for input(s): AMMONIA in the last 168 hours. Coagulation Profile: No results for input(s): INR, PROTIME in the last 168 hours. Cardiac Enzymes: No results for input(s): CKTOTAL, CKMB, CKMBINDEX, TROPONINI in the last 168 hours. BNP (last 3 results) No results for input(s): PROBNP in the last 8760 hours. HbA1C: No results for input(s): HGBA1C  in the last 72 hours. CBG: Recent Labs  Lab 07/05/24 1538 07/05/24 2115 07/06/24 0019 07/06/24 0510 07/06/24 0742  GLUCAP 170* 207* 211* 87 106*   Lipid Profile: Recent Labs    07/04/24 0330  TRIG 237*   Thyroid  Function Tests: No results for input(s): TSH, T4TOTAL, FREET4, T3FREE, THYROIDAB in the last 72 hours. Anemia Panel: No results for input(s): VITAMINB12, FOLATE, FERRITIN, TIBC, IRON, RETICCTPCT in the last 72 hours. Sepsis Labs: Recent Labs  Lab 07/02/24 1302 07/02/24 1527 07/03/24 1825 07/05/24 1850  LATICACIDVEN 0.6 1.1 1.0 1.2    Recent Results (from the past 240 hours)  MRSA Next Gen by PCR, Nasal     Status: None   Collection Time: 07/03/24  5:01 PM   Specimen: Nasal Mucosa; Nasal Swab  Result Value Ref Range Status   MRSA by PCR Next Gen NOT DETECTED NOT DETECTED Final    Comment: (NOTE) The GeneXpert MRSA Assay (FDA approved for NASAL specimens only), is one component of a comprehensive MRSA colonization surveillance program. It is not intended to diagnose MRSA infection nor to guide or monitor treatment for MRSA infections. Test  performance is not FDA approved in patients less than 30 years old. Performed at [facility], [address] [place], [place] [address]         Radiology Studies: No results found.      LOS: 2 days   Time spent= 41 mins    Deliliah Room, MD [facility]  If 7PM-7AM, please contact night-coverage  07/06/2024, 9:45 AM

## 2024-07-06 NOTE — Evaluation (Signed)
 Physical Therapy Evaluation Patient Details Name: Jasmine Keith MRN: [id number] DOB: 1951/07/19 Today's Date: 07/06/2024  History of Present Illness  The pt is a 73 yo female presenting 8/27 with abdominal pain and diarrhea. Admitted for management of hypoglycemia and diverticulitis. Rapid response called 8/28 due to possible anaphylaxis, pt intubated and transferred to ICU, extubated 8/30. Hospital course complicated by elevated troponin, cardiology consulted and suspect due to demand ischemia. RUE swelling noted, awaiting ultrasound to rule out DVT. 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   Clinical Impression  Pt in bed upon arrival of PT, agreeable to evaluation at this time. Prior to admission the pt was independent with mobility, reports hx of falls but no use of DME. The pt lives with her spouse who assists as needed for longer-distance ambulation or with ADLs due to pt hx of falls. The pt was able to complete bed mobility, sit-stand transfers, and hallway ambulation with largely minA and use of RW for BUE support. She did have intermittent lateral LOB needing minA to recover with gait, but was able to initiate some correction to mild LOB and maintained VSS. Will continue to follow acutely and progress mobility, strength, and dynamic stability as tolerated. Anticipate pt will be able to return home with spouse support once medically stable, but recommend OT evaluation to further assess endurance and stability for ADLs.     If plan is discharge home, recommend the following: A little help with walking and/or transfers;A little help with bathing/dressing/bathroom;Assistance with cooking/housework;Direct supervision/assist for medications management;Direct supervision/assist for financial management;Assist for transportation;Help with stairs or ramp for entrance;Supervision due to cognitive  status   Can travel by private vehicle        Equipment Recommendations Rollator (4 wheels)  Recommendations for Other Services  OT consult    Functional Status Assessment Patient has had a recent decline in their functional status and demonstrates the ability to make significant improvements in function in a reasonable and predictable amount of time.     Precautions / Restrictions Precautions Precautions: Fall Recall of Precautions/Restrictions: Impaired Precaution/Restrictions Comments: watch BP, stable this session Restrictions Weight Bearing Restrictions Per Provider Order: No      Mobility  Bed Mobility Overal bed mobility: Needs Assistance Bed Mobility: Supine to Sit, Sit to Supine     Supine to sit: Min assist, HOB elevated, Used rails Sit to supine: Min assist   General bed mobility comments: increased time and minA to complete    Transfers Overall transfer level: Needs assistance Equipment used: Rolling walker (2 wheels) Transfers: Sit to/from Stand Sit to Stand: Min assist           General transfer comment: minA, posterior LOB with initial stand but then steady with largely CGA    Ambulation/Gait Ambulation/Gait assistance: Min assist Gait Distance (Feet): 100 Feet Assistive device: Rolling walker (2 wheels) Gait Pattern/deviations: Step-through pattern, Decreased stride length, Trunk flexed, Narrow base of support, Drifts right/left Gait velocity: decreased Gait velocity interpretation: <1.8 ft/sec, indicate of risk for recurrent falls   General Gait Details: intermittent drifting and lateral LOB but corrected with minA. VSS    Balance Overall balance assessment: History of Falls, Needs assistance Sitting-balance support: No upper extremity supported, Feet supported Sitting balance-Leahy Scale: Good     Standing balance support: Bilateral upper extremity supported, During functional activity, Reliant on assistive device for balance Standing  balance-Leahy Scale: Poor  Standing balance comment: minA to correct lateral LOB with BUE support                             Pertinent Vitals/Pain Pain Assessment Pain Assessment: Faces Faces Pain Scale: Hurts even more Pain Location: RUE Pain Descriptors / Indicators: Sharp Pain Intervention(s): Limited activity within patient's tolerance, Monitored during session, Repositioned    Home Living Family/patient expects to be discharged to:: Private residence Living Arrangements: Spouse/significant other Available Help at Discharge: Family Type of Home: House Home Access: Level entry       Home Layout: One level Home Equipment: Agricultural consultant (2 wheels);Grab bars - tub/shower;Shower seat;Hand held shower head      Prior Function Prior Level of Function : Needs assist;History of Falls (last six months)             Mobility Comments: limited endurance (<100 ft), reports  a couple falls in last 6 months ADLs Comments: spouse assists her into bathroom, she manages washing, spouse does medications, she can cook     Extremity/Trunk Assessment   Upper Extremity Assessment Upper Extremity Assessment: Generalized weakness    Lower Extremity Assessment Lower Extremity Assessment: Generalized weakness    Cervical / Trunk Assessment Cervical / Trunk Assessment: Normal  Communication   Communication Communication: No apparent difficulties    Cognition Arousal: Alert Behavior During Therapy: WFL for tasks assessed/performed   PT - Cognitive impairments: Awareness, Memory, Initiation, Problem solving, Sequencing, Safety/Judgement                       PT - Cognition Comments: pt relying on spouse at times to answer, but largely able to answer questions when he was not present. slight delay, forgetting some falls/events prior to hospitalization. Following commands: Impaired Following commands impaired: Follows one step commands with increased time      Cueing Cueing Techniques: Verbal cues     General Comments General comments (skin integrity, edema, etc.): BP elevated but stable    Exercises     Assessment/Plan    PT Assessment Patient needs continued PT services  PT Problem List Decreased strength;Decreased activity tolerance;Decreased balance;Decreased mobility;Decreased safety awareness       PT Treatment Interventions DME instruction;Gait training;Stair training;Functional mobility training;Therapeutic activities;Therapeutic exercise;Balance training;Patient/family education    PT Goals (Current goals can be found in the Care Plan section)  Acute Rehab PT Goals Patient Stated Goal: to return home PT Goal Formulation: With patient Time For Goal Achievement: 07/20/24 Potential to Achieve Goals: Good    Frequency Min 2X/week        AM-PAC PT 6 Clicks Mobility  Outcome Measure Help needed turning from your back to your side while in a flat bed without using bedrails?: A Little Help needed moving from lying on your back to sitting on the side of a flat bed without using bedrails?: A Little Help needed moving to and from a bed to a chair (including a wheelchair)?: A Little Help needed standing up from a chair using your arms (e.g., wheelchair or bedside chair)?: A Little Help needed to walk in hospital room?: A Little Help needed climbing 3-5 steps with a railing? : A Lot 6 Click Score: 17    End of Session Equipment Utilized During Treatment: Gait belt Activity Tolerance: Patient tolerated treatment well Patient left: in bed;with call bell/phone within reach;with bed alarm set;with family/visitor present Nurse Communication: Mobility status;Other (comment) (BP elevated  but stable) PT Visit Diagnosis: Unsteadiness on feet (R26.81);Other abnormalities of gait and mobility (R26.89);Repeated falls (R29.6);Muscle weakness (generalized) (M62.81)    Time: 8675-8594 PT Time Calculation (min) (ACUTE ONLY): 41  min   Charges:   PT Evaluation $PT Eval Moderate Complexity: 1 Mod PT Treatments $Gait Training: 8-22 mins $Therapeutic Exercise: 8-22 mins PT General Charges $$ ACUTE PT VISIT: 1 Visit         Izetta Call, PT, DPT   Acute Rehabilitation Department Office [phone] Secure Chat Communication Preferred  Izetta JULIANNA Call 07/06/2024, 3:04 PM

## 2024-07-06 NOTE — Plan of Care (Signed)
   Problem: Education: Goal: Knowledge of General Education information will improve Description Including pain rating scale, medication(s)/side effects and non-pharmacologic comfort measures Outcome: Progressing

## 2024-07-07 DIAGNOSIS — K5792 Diverticulitis of intestine, part unspecified, without perforation or abscess without bleeding: Secondary | ICD-10-CM | POA: Diagnosis not present

## 2024-07-07 LAB — CBC WITH DIFFERENTIAL/PLATELET
Abs Immature Granulocytes: 0.1 K/uL — ABNORMAL HIGH (ref 0.00–0.07)
Basophils Absolute: 0 K/uL (ref 0.0–0.1)
Basophils Relative: 0 %
Eosinophils Absolute: 0 K/uL (ref 0.0–0.5)
Eosinophils Relative: 0 %
HCT: 33.6 % — ABNORMAL LOW (ref 36.0–46.0)
Hemoglobin: 11.2 g/dL — ABNORMAL LOW (ref 12.0–15.0)
Immature Granulocytes: 2 %
Lymphocytes Relative: 7 %
Lymphs Abs: 0.3 K/uL — ABNORMAL LOW (ref 0.7–4.0)
MCH: 27.8 pg (ref 26.0–34.0)
MCHC: 33.3 g/dL (ref 30.0–36.0)
MCV: 83.4 fL (ref 80.0–100.0)
Monocytes Absolute: 0.1 K/uL (ref 0.1–1.0)
Monocytes Relative: 2 %
Neutro Abs: 3.6 K/uL (ref 1.7–7.7)
Neutrophils Relative %: 89 %
Platelets: 285 K/uL (ref 150–400)
RBC: 4.03 MIL/uL (ref 3.87–5.11)
RDW: 14.2 % (ref 11.5–15.5)
WBC: 4.1 K/uL (ref 4.0–10.5)
nRBC: 0 % (ref 0.0–0.2)

## 2024-07-07 LAB — URINALYSIS, ROUTINE W REFLEX MICROSCOPIC
Bacteria, UA: NONE SEEN
Bilirubin Urine: NEGATIVE
Glucose, UA: >=500 mg/dL — AB
Ketones, ur: NEGATIVE mg/dL
Nitrite: NEGATIVE
Protein, ur: 100 mg/dL — AB
Specific Gravity, Urine: 1.009 (ref 1.005–1.030)
pH: 6 (ref 5.0–8.0)

## 2024-07-07 LAB — COMPREHENSIVE METABOLIC PANEL WITH GFR
ALT: 17 U/L (ref 0–44)
AST: 42 U/L — ABNORMAL HIGH (ref 15–41)
Albumin: 2.7 g/dL — ABNORMAL LOW (ref 3.5–5.0)
Alkaline Phosphatase: 44 U/L (ref 38–126)
Anion gap: 15 (ref 5–15)
BUN: 44 mg/dL — ABNORMAL HIGH (ref 8–23)
CO2: 14 mmol/L — ABNORMAL LOW (ref 22–32)
Calcium: 8.6 mg/dL — ABNORMAL LOW (ref 8.9–10.3)
Chloride: 111 mmol/L (ref 98–111)
Creatinine, Ser: 1.76 mg/dL — ABNORMAL HIGH (ref 0.44–1.00)
GFR, Estimated: 31 mL/min — ABNORMAL LOW (ref >60)
Glucose, Bld: 122 mg/dL — ABNORMAL HIGH (ref 70–99)
Potassium: 4.2 mmol/L (ref 3.5–5.1)
Sodium: 140 mmol/L (ref 135–145)
Total Bilirubin: 0.7 mg/dL (ref 0.0–1.2)
Total Protein: 6 g/dL — ABNORMAL LOW (ref 6.5–8.1)

## 2024-07-07 LAB — GLUCOSE, CAPILLARY
Glucose-Capillary: 120 mg/dL — ABNORMAL HIGH (ref 70–99)
Glucose-Capillary: 134 mg/dL — ABNORMAL HIGH (ref 70–99)
Glucose-Capillary: 172 mg/dL — ABNORMAL HIGH (ref 70–99)
Glucose-Capillary: 179 mg/dL — ABNORMAL HIGH (ref 70–99)
Glucose-Capillary: 182 mg/dL — ABNORMAL HIGH (ref 70–99)
Glucose-Capillary: 283 mg/dL — ABNORMAL HIGH (ref 70–99)

## 2024-07-07 MED ORDER — ENSURE PLUS HIGH PROTEIN PO LIQD
237.0000 mL | Freq: Two times a day (BID) | ORAL | Status: DC
  Administered 2024-07-07 – 2024-07-08 (×2): 237 mL via ORAL

## 2024-07-07 NOTE — Progress Notes (Signed)
 Mobility Specialist Progress Note:    07/07/24 1513  Mobility  Activity Ambulated with assistance (In hallway)  Level of Assistance Minimal assist, patient does 75% or more  Assistive Device Front wheel walker  Distance Ambulated (ft) 104 ft  Activity Response Tolerated well  Mobility Referral Yes  Mobility visit 1 Mobility  Mobility Specialist Start Time (ACUTE ONLY) 1438  Mobility Specialist Stop Time (ACUTE ONLY) 1505  Mobility Specialist Time Calculation (min) (ACUTE ONLY) 27 min   Received pt in bed and agreeable to mobility. Required MinA STS and VC for walker management. No c/o. Returned to room without fault. Left pt in bed with alarm on. Personal belongings and call light within reach. All needs met.   Lavanda Pollack Mobility Specialist  Please contact via [employer] or  Rehab Office [phone]

## 2024-07-07 NOTE — Plan of Care (Signed)
   Problem: Education: Goal: Knowledge of General Education information will improve Description Including pain rating scale, medication(s)/side effects and non-pharmacologic comfort measures Outcome: Progressing

## 2024-07-07 NOTE — Progress Notes (Addendum)
 PROGRESS NOTE    Jasmine Keith  FMW:[id number] DOB: Mar 10, 1951 DOA: 07/02/2024 PCP: Dorina Dallas RIGGERS   Brief Narrative:   73 year old female admitted to the hospital secondary to abdominal pain and diarrhea. CT abdomen/pelvis was obtained and showed diverticulitis. In the ED, she received IV antibiotics with Ceftriaxone  and Flagyl . She was admitted to the medical floor and developed acute onset shortness of breath and tongue swelling. She was experiencing respiratory distress and a rapid response was called. PCCM was consulted and patient was intubated for airway protection. She was subsequently extubated on 8/30. TR[facility] assumed care on 8/31.   Assessment & Plan:  Principal Problem:   Diverticulitis Active Problems:   Uncontrolled type 2 diabetes mellitus with hypoglycemia, with long-term current use of insulin  (HCC)   Angioedema   Malnutrition of moderate degree   Acute Hypoxic Respiratory Failure S/p intubation with subsequent extubation No evidence of acute hypoxia at this point  Swollen RUE: -Ordered US  venous duplex to rule out DVT   CAD s/p CABG Troponin elevation (600 ? 1400) with EKG showing ST changes in V1-V2. Cardiology consulted -Troponin peaked at 1493 - TTE reassuring, appreciate cardiology evaluation - Amlodipine  and carvedilol  on hold for now  Distributive shock, improved Angioedema  Suspect shock due to sedation, possible contribution of anaphylaxis.  Consider component of sepsis with her diverticulitis.  Pressors off - Augmentin  discontinued - Solu-Medrol , wean to 40 mg daily 8/30 - Benadryl , Pepcid  scheduled   Acute uncomplicated diverticulitis without perforation: -Afebrile, hemodynamically stable, no leukocytosis.  Continue with aztreonam  and metronidazole .   - Plan is to complete 7 days total antibiotics  Extensive erythematous rash: Involving b/l eye lids, anterior chest, both arms and legs. Continue with anti-allergics and benadryl    Acute  tubular necrosis CKD stage IV Anion gap metabolic acidosis - Follow BMP, urine output closely - Avoid nephrotoxic medications and renal dose medications.   Hx of SLE History of SLE on Plaquenil .  -Plaquenil  on hold   Hypothyroidism - Continue levothyroxine  25  Disposition: Lives at home with her husband and is IADL.   DVT prophylaxis: SCDs Start: 07/03/24 1704 SCDs Start: 07/02/24 1456. Dced heparin  on 8/31 as patient has an episode of rectal bleeding, mild.     Code Status: Full Code Family Communication:  Spoke to husband on the phone on 8/31. Status is: Inpatient Remains inpatient appropriate because: Diverticulitis, Kidney dysfunction    Subjective:  Complaining of sore throat which has gotten worse and throat spray hasn't helped. She has developed sores/rash over b/l eye lids as well in addition to extensive erythemtous rash over multiple areas of her body.   Examination:  General exam: Appears calm and comfortable  Respiratory system: Clear to auscultation. Respiratory effort normal. Cardiovascular system: S1 & S2 heard, RRR. No JVD, murmurs, rubs, gallops or clicks. No pedal edema. Gastrointestinal system: Abdomen is nondistended, soft and nontender. No organomegaly or masses felt. Normal bowel sounds heard. Central nervous system: Alert and oriented. No focal neurological deficits. Extremities: Symmetric 5 x 5 power. Swollen, erythematous right arm Skin: Swollen, erythematous right arm, evidence of erythematous rash over b/l eyelids, anterior chest, both arms, neck, as well as lower extremities. Psychiatry: Judgement and insight appear normal. Mood & affect appropriate.       Diet Orders (From admission, onward)     Start     Ordered   07/05/24 1325  Diet Carb Modified Fluid consistency: Thin; Room service appropriate? Yes  Diet effective now  Question Answer Comment  Diet-HS Snack? Nothing   Calorie Level Medium 1600-2000   Fluid consistency: Thin    Room service appropriate? Yes      07/05/24 1325            Objective: Vitals:   07/06/24 2056 07/07/24 0500 07/07/24 0524 07/07/24 0720  BP: (!) 199/83  (!) 199/93 (!) 196/87  Pulse: 84  85 79  Resp:    16  Temp: 98.3 F (36.8 C)  98 F (36.7 C) 98.4 F (36.9 C)  TempSrc: Oral   Oral  SpO2: 100%  100% 100%  Weight:  48.4 kg    Height:        Intake/Output Summary (Last 24 hours) at 07/07/2024 0904 Last data filed at 07/06/2024 1800 Gross per 24 hour  Intake 480 ml  Output --  Net 480 ml   Filed Weights   07/05/24 0500 07/06/24 0500 07/07/24 0500  Weight: 49.9 kg 48.4 kg 48.4 kg    Scheduled Meds:  Chlorhexidine  Gluconate Cloth  6 each Topical Daily   dicyclomine   10 mg Oral TID AC & HS   diphenhydrAMINE   25 mg Intravenous Q6H   famotidine   20 mg Oral Daily   gabapentin   300 mg Oral Daily   levothyroxine   25 mcg Oral Q0600   loratadine   10 mg Oral Daily   methylPREDNISolone  (SOLU-MEDROL ) injection  40 mg Intravenous Daily   thiamine   100 mg Oral Daily   venlafaxine   37.5 mg Oral BID   Continuous Infusions:    Nutritional status Signs/Symptoms: mild fat depletion, severe muscle depletion, percent weight loss (9.8% x 5 months) Percent weight loss: 10 % Interventions: Refer to RD note for recommendations, Tube feeding, Prostat Body mass index is 18.9 kg/m.  Data Reviewed:   CBC: Recent Labs  Lab 07/02/24 1029 07/03/24 1751 07/03/24 1825 07/04/24 0330 07/05/24 0328 07/06/24 0900  WBC 5.3  --  3.3* 2.8* 5.1 5.3  NEUTROABS  --   --  2.9 2.3 4.5 4.4  HGB 9.9* 10.2* 11.6* 10.3* 8.4* 9.3*  HCT 31.1* 30.0* 34.7* 31.1* 26.8* 29.2*  MCV 90.1  --  84.4 83.8 92.4 89.8  PLT 227  --  268 218 218 264   Basic Metabolic Panel: Recent Labs  Lab 07/03/24 1825 07/04/24 0330 07/04/24 1008 07/05/24 0328 07/05/24 1850 07/06/24 0901  NA 134* 134* 135 133* 135 137  K 3.8 3.9 4.0 4.4 4.4 4.0  CL 105 105 102 106 107 110  CO2 19* 17* 18* 13* 17* 15*   GLUCOSE 223* 158* 151* 226* 178* 216*  BUN 24* 28* 34* 47* 54* 52*  CREATININE 1.65* 1.93* 2.40* 2.95* 2.75* 2.37*  CALCIUM  8.5* 8.5* 8.5* 7.9* 8.1* 8.5*  MG 1.7  --   --  3.0*  --   --   PHOS 4.1  --   --  7.7*  --   --    GFR: Estimated Creatinine Clearance: 16.6 mL/min (A) (by C-G formula based on SCr of 2.37 mg/dL (H)). Liver Function Tests: Recent Labs  Lab 07/02/24 1029 07/03/24 1825 07/04/24 0330 07/05/24 0328 07/06/24 0901  AST 24 31 30 24  34  ALT 9 9 9 9 12   ALKPHOS 37* 40 30* 34* 48  BILITOT 0.8 0.9 0.8 0.6 0.5  PROT 6.3* 6.3* 5.7* 5.3* 5.8*  ALBUMIN  2.8* 2.8* 2.4* 2.2* 2.5*   Recent Labs  Lab 07/02/24 1029  LIPASE 32   No results for input(s): AMMONIA in the last  168 hours. Coagulation Profile: No results for input(s): INR, PROTIME in the last 168 hours. Cardiac Enzymes: No results for input(s): CKTOTAL, CKMB, CKMBINDEX, TROPONINI in the last 168 hours. BNP (last 3 results) No results for input(s): PROBNP in the last 8760 hours. HbA1C: No results for input(s): HGBA1C in the last 72 hours. CBG: Recent Labs  Lab 07/06/24 1544 07/06/24 2057 07/07/24 0003 07/07/24 0522 07/07/24 0716  GLUCAP 295* 235* 172* 134* 120*   Lipid Profile: No results for input(s): CHOL, HDL, LDLCALC, TRIG, CHOLHDL, LDLDIRECT in the last 72 hours.  Thyroid  Function Tests: No results for input(s): TSH, T4TOTAL, FREET4, T3FREE, THYROIDAB in the last 72 hours. Anemia Panel: No results for input(s): VITAMINB12, FOLATE, FERRITIN, TIBC, IRON, RETICCTPCT in the last 72 hours. Sepsis Labs: Recent Labs  Lab 07/02/24 1302 07/02/24 1527 07/03/24 1825 07/05/24 1850  LATICACIDVEN 0.6 1.1 1.0 1.2    Recent Results (from the past 240 hours)  MRSA Next Gen by PCR, Nasal     Status: None   Collection Time: 07/03/24  5:01 PM   Specimen: Nasal Mucosa; Nasal Swab  Result Value Ref Range Status   MRSA by PCR Next Gen NOT DETECTED NOT  DETECTED Final    Comment: (NOTE) The GeneXpert MRSA Assay (FDA approved for NASAL specimens only), is one component of a comprehensive MRSA colonization surveillance program. It is not intended to diagnose MRSA infection nor to guide or monitor treatment for MRSA infections. Test performance is not FDA approved in patients less than 20 years old. Performed at [facility], [address], [place], [place] [address]         Radiology Studies: No results found.      LOS: 3 days   Time spent= 43 mins    Deliliah Room, MD [facility]  If 7PM-7AM, please contact night-coverage  07/07/2024, 9:04 AM

## 2024-07-07 NOTE — Evaluation (Signed)
 Occupational Therapy Evaluation Patient Details Name: Jasmine Keith MRN: [id number] DOB: Mar 27, 1951 Today's Date: 07/07/2024   History of Present Illness   The pt is a 73 yo female presenting 8/27 with abdominal pain and diarrhea. Admitted for management of hypoglycemia and diverticulitis. Rapid response called 8/28 due to possible anaphylaxis, pt intubated and transferred to ICU, extubated 8/30. Hospital course complicated by elevated troponin, cardiology consulted and suspect due to demand ischemia. RUE swelling noted, awaiting ultrasound to rule out DVT. 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     Clinical Impressions Prior to this admission, patient living with her spouse, does not drive, and will occasionally use AD for ambulation. Per husband, patient requires assist for ADLs, and he manages her medication. Currently, patient is confused, presenting with generalized weakness, decreased activity tolerance, and min A for bed mobility, ADLs, transfers. Despite use of errorless learning techniques, patient unable to state where she was after 5-10 seconds elapsed. OT recommending HHOT at discharge, with OT to conitnue to follow acutely.      If plan is discharge home, recommend the following:   A little help with walking and/or transfers;A little help with bathing/dressing/bathroom;Assistance with cooking/housework;Direct supervision/assist for medications management;Direct supervision/assist for financial management;Assist for transportation;Help with stairs or ramp for entrance;Supervision due to cognitive status     Functional Status Assessment   Patient has had a recent decline in their functional status and demonstrates the ability to make significant improvements in function in a reasonable and predictable amount of time.     Equipment Recommendations   None recommended  by OT     Recommendations for Other Services         Precautions/Restrictions   Precautions Precautions: Fall Recall of Precautions/Restrictions: Impaired Restrictions Weight Bearing Restrictions Per Provider Order: No     Mobility Bed Mobility Overal bed mobility: Needs Assistance Bed Mobility: Supine to Sit, Sit to Supine     Supine to sit: Min assist, HOB elevated, Used rails Sit to supine: Min assist   General bed mobility comments: increased time and min A to complete, L lateral lean with cues to correct    Transfers Overall transfer level: Needs assistance Equipment used: Rolling walker (2 wheels) Transfers: Sit to/from Stand Sit to Stand: Min assist           General transfer comment: minA, posterior LOB with initial stand and cues for foot placement, cues required throughout, remained min A throughout session      Balance Overall balance assessment: History of Falls, Needs assistance Sitting-balance support: No upper extremity supported, Feet supported Sitting balance-Leahy Scale: Good     Standing balance support: Bilateral upper extremity supported, During functional activity, Reliant on assistive device for balance Standing balance-Leahy Scale: Poor Standing balance comment: minA to correct lateral LOB with BUE support                           ADL either performed or assessed with clinical judgement   ADL Overall ADL's : Needs assistance/impaired Eating/Feeding: Set up;Sitting   Grooming: Set up;Sitting   Upper Body Bathing: Contact guard assist;Sitting   Lower Body Bathing: Minimal assistance;Sit to/from stand;Sitting/lateral leans   Upper Body Dressing : Contact guard assist;Sitting   Lower Body Dressing: Minimal assistance;Sitting/lateral leans;Sit to/from stand   Toilet Transfer: Minimal assistance;Ambulation;Rolling walker (2 wheels)   Toileting- Clothing Manipulation  and Hygiene: Minimal assistance;Sitting/lateral  lean;Sit to/from stand Toileting - Clothing Manipulation Details (indicate cue type and reason): for thoroughness     Functional mobility during ADLs: Minimal assistance;Cueing for safety;Cueing for sequencing;Rolling walker (2 wheels) General ADL Comments: Prior to this admission, patient living with her spouse, does not drive, and will occasionally use AD for ambulation. Per husband, patient requires assist for ADLs, and he manages her medication. Currently, patient is confused, presenting with generalized weakness, decreased activity tolerance, and min A for bed mobility, ADLs, transfers. Despite use of errorless learning techniques, patient unable to state where she was after 5-10 seconds elapsed. OT recommending HHOT at discharge, with OT to conitnue to follow acutely.     Vision Baseline Vision/History: 1 Wears glasses Ability to See in Adequate Light: 0 Adequate Patient Visual Report: Blurring of vision Vision Assessment?: Yes;Wears glasses for reading Eye Alignment: Within Functional Limits Ocular Range of Motion: Within Functional Limits Alignment/Gaze Preference: Within Defined Limits Tracking/Visual Pursuits: Able to track stimulus in all quads without difficulty Saccades: Decreased speed of saccadic movement Convergence: Within functional limits Additional Comments: Reports blurred vision but is unable to elaborate despite increased attempts     Perception Perception: Not tested       Praxis Praxis: Not tested       Pertinent Vitals/Pain Pain Assessment Pain Assessment: Faces Faces Pain Scale: Hurts a little bit Pain Location: generalized, could not state Pain Descriptors / Indicators: Grimacing, Guarding, Discomfort Pain Intervention(s): Limited activity within patient's tolerance, Monitored during session, Repositioned     Extremity/Trunk Assessment Upper Extremity Assessment Upper Extremity Assessment: Generalized weakness;Right hand dominant   Lower Extremity  Assessment Lower Extremity Assessment: Defer to PT evaluation   Cervical / Trunk Assessment Cervical / Trunk Assessment: Normal   Communication Communication Communication: No apparent difficulties   Cognition Arousal: Alert Behavior During Therapy: WFL for tasks assessed/performed Cognition: History of cognitive impairments, Cognition impaired   Orientation impairments: Place, Time, Situation Awareness: Intellectual awareness impaired, Online awareness impaired Memory impairment (select all impairments): Short-term memory, Working [employer] fast streamer, Non-declarative long-term memory, Geneticist, molecular long-term memory Attention impairment (select first level of impairment): Focused attention Executive functioning impairment (select all impairments): Initiation, Organization, Sequencing, Reasoning, Problem solving OT - Cognition Comments: Unable to state where she was, even with choices, errorless learning used throughout, and unable to recall after 5-10 seconds, often looking off in the distance and requiring redirection                 Following commands: Impaired Following commands impaired: Follows one step commands with increased time     Cueing  General Comments   Cueing Techniques: Verbal cues      Exercises     Shoulder Instructions      Home Living Family/patient expects to be discharged to:: Private residence Living Arrangements: Spouse/significant other Available Help at Discharge: Family Type of Home: House Home Access: Level entry     Home Layout: One level     Bathroom Shower/Tub: Chief Strategy Officer: Standard     Home Equipment: Agricultural consultant (2 wheels);Grab bars - tub/shower;Shower seat;Hand held shower head;Tub bench          Prior Functioning/Environment Prior Level of Function : Needs assist;History of Falls (last six months)             Mobility Comments: limited endurance (<100 ft), reports  a couple falls in last 6 months ADLs  Comments: spouse assists her into bathroom, she manages washing, spouse  does medications, she can cook    OT Problem List: Decreased strength;Decreased range of motion;Impaired balance (sitting and/or standing);Decreased coordination;Decreased cognition;Decreased safety awareness;Decreased knowledge of use of DME or AE;Decreased knowledge of precautions   OT Treatment/Interventions: Self-care/ADL training;Therapeutic exercise;Energy conservation;DME and/or AE instruction;Manual therapy;Therapeutic activities;Patient/family education;Balance training;Cognitive remediation/compensation      OT Goals(Current goals can be found in the care plan section)   Acute Rehab OT Goals Patient Stated Goal: to get some sleep OT Goal Formulation: With patient Time For Goal Achievement: 07/21/24 Potential to Achieve Goals: Good   OT Frequency:  Min 2X/week    Co-evaluation              AM-PAC OT 6 Clicks Daily Activity     Outcome Measure Help from another person eating meals?: A Little Help from another person taking care of personal grooming?: A Little Help from another person toileting, which includes using toliet, bedpan, or urinal?: A Little Help from another person bathing (including washing, rinsing, drying)?: A Little Help from another person to put on and taking off regular upper body clothing?: A Little Help from another person to put on and taking off regular lower body clothing?: A Little 6 Click Score: 18   End of Session Equipment Utilized During Treatment: Gait belt;Rolling walker (2 wheels) Nurse Communication: Mobility status  Activity Tolerance: Patient tolerated treatment well Patient left: in bed;with call bell/phone within reach;with bed alarm set;with family/visitor present  OT Visit Diagnosis: Unsteadiness on feet (R26.81);Other abnormalities of gait and mobility (R26.89);Repeated falls (R29.6);Muscle weakness (generalized) (M62.81);History of falling (Z91.81);Other  symptoms and signs involving cognitive function                Time: 8848-8780 OT Time Calculation (min): 28 min Charges:  OT General Charges $OT Visit: 1 Visit OT Evaluation $OT Eval Moderate Complexity: 1 Mod OT Treatments $Self Care/Home Management : 8-22 mins  Ronal Gift E. Bryar Dahms, OTR/L Acute Rehabilitation Services [phone]   Ronal Gift Salt 07/07/2024, 1:35 PM

## 2024-07-08 ENCOUNTER — Inpatient Hospital Stay (HOSPITAL_COMMUNITY)

## 2024-07-08 DIAGNOSIS — K5792 Diverticulitis of intestine, part unspecified, without perforation or abscess without bleeding: Secondary | ICD-10-CM | POA: Diagnosis not present

## 2024-07-08 DIAGNOSIS — M7989 Other specified soft tissue disorders: Secondary | ICD-10-CM

## 2024-07-08 DIAGNOSIS — E43 Unspecified severe protein-calorie malnutrition: Secondary | ICD-10-CM | POA: Insufficient documentation

## 2024-07-08 LAB — COMPREHENSIVE METABOLIC PANEL WITH GFR
ALT: 20 U/L (ref 0–44)
AST: 38 U/L (ref 15–41)
Albumin: 2.7 g/dL — ABNORMAL LOW (ref 3.5–5.0)
Alkaline Phosphatase: 46 U/L (ref 38–126)
Anion gap: 15 (ref 5–15)
BUN: 38 mg/dL — ABNORMAL HIGH (ref 8–23)
CO2: 19 mmol/L — ABNORMAL LOW (ref 22–32)
Calcium: 9.1 mg/dL (ref 8.9–10.3)
Chloride: 104 mmol/L (ref 98–111)
Creatinine, Ser: 1.57 mg/dL — ABNORMAL HIGH (ref 0.44–1.00)
GFR, Estimated: 35 mL/min — ABNORMAL LOW (ref >60)
Glucose, Bld: 136 mg/dL — ABNORMAL HIGH (ref 70–99)
Potassium: 4.1 mmol/L (ref 3.5–5.1)
Sodium: 138 mmol/L (ref 135–145)
Total Bilirubin: 1 mg/dL (ref 0.0–1.2)
Total Protein: 6.3 g/dL — ABNORMAL LOW (ref 6.5–8.1)

## 2024-07-08 LAB — GLUCOSE, CAPILLARY
Glucose-Capillary: 134 mg/dL — ABNORMAL HIGH (ref 70–99)
Glucose-Capillary: 143 mg/dL — ABNORMAL HIGH (ref 70–99)
Glucose-Capillary: 155 mg/dL — ABNORMAL HIGH (ref 70–99)
Glucose-Capillary: 224 mg/dL — ABNORMAL HIGH (ref 70–99)
Glucose-Capillary: 264 mg/dL — ABNORMAL HIGH (ref 70–99)
Glucose-Capillary: 281 mg/dL — ABNORMAL HIGH (ref 70–99)
Glucose-Capillary: 360 mg/dL — ABNORMAL HIGH (ref 70–99)

## 2024-07-08 MED ORDER — INSULIN ASPART 100 UNIT/ML IJ SOLN
0.0000 [IU] | Freq: Three times a day (TID) | INTRAMUSCULAR | Status: DC
  Administered 2024-07-08: 9 [IU] via SUBCUTANEOUS
  Administered 2024-07-08: 5 [IU] via SUBCUTANEOUS
  Administered 2024-07-09: 2 [IU] via SUBCUTANEOUS
  Administered 2024-07-09: 1 [IU] via SUBCUTANEOUS

## 2024-07-08 MED ORDER — CARVEDILOL 6.25 MG PO TABS
6.2500 mg | ORAL_TABLET | Freq: Two times a day (BID) | ORAL | Status: DC
  Administered 2024-07-08 – 2024-07-09 (×3): 6.25 mg via ORAL
  Filled 2024-07-08 (×3): qty 1

## 2024-07-08 MED ORDER — HYDRALAZINE HCL 50 MG PO TABS
100.0000 mg | ORAL_TABLET | Freq: Three times a day (TID) | ORAL | Status: DC
Start: 2024-07-08 — End: 2024-07-09
  Administered 2024-07-08 – 2024-07-09 (×3): 100 mg via ORAL
  Filled 2024-07-08 (×3): qty 2

## 2024-07-08 MED ORDER — ADULT MULTIVITAMIN W/MINERALS CH
1.0000 | ORAL_TABLET | Freq: Every day | ORAL | Status: DC
  Administered 2024-07-08 – 2024-07-09 (×2): 1 via ORAL
  Filled 2024-07-08 (×2): qty 1

## 2024-07-08 MED ORDER — AMLODIPINE BESYLATE 5 MG PO TABS
5.0000 mg | ORAL_TABLET | Freq: Every day | ORAL | Status: DC
  Administered 2024-07-08 – 2024-07-09 (×2): 5 mg via ORAL
  Filled 2024-07-08 (×2): qty 1

## 2024-07-08 MED ORDER — BUSPIRONE HCL 5 MG PO TABS
7.5000 mg | ORAL_TABLET | Freq: Two times a day (BID) | ORAL | Status: DC
  Administered 2024-07-08 – 2024-07-09 (×3): 7.5 mg via ORAL
  Filled 2024-07-08 (×3): qty 2

## 2024-07-08 MED ORDER — INSULIN GLARGINE 100 UNIT/ML ~~LOC~~ SOLN
15.0000 [IU] | Freq: Every day | SUBCUTANEOUS | Status: DC
  Administered 2024-07-08 – 2024-07-09 (×2): 15 [IU] via SUBCUTANEOUS
  Filled 2024-07-08 (×2): qty 0.15

## 2024-07-08 MED ORDER — INSULIN ASPART 100 UNIT/ML IJ SOLN
0.0000 [IU] | Freq: Every day | INTRAMUSCULAR | Status: DC
  Administered 2024-07-08: 2 [IU] via SUBCUTANEOUS

## 2024-07-08 MED ORDER — BOOST / RESOURCE BREEZE PO LIQD CUSTOM
1.0000 | Freq: Three times a day (TID) | ORAL | Status: DC
  Administered 2024-07-08: 1 via ORAL
  Administered 2024-07-08: 237 mL via ORAL
  Administered 2024-07-09: 1 via ORAL

## 2024-07-08 NOTE — Progress Notes (Addendum)
 Nutrition Follow-up  DOCUMENTATION CODES:   Underweight, Severe malnutrition in context of acute illness/injury  INTERVENTION:  Encourage PO intake-Currently on regular diet with thin liquids Transition to regular diet to promote PO intake CBG should be managed by insulin  by MD discretion  Trial Boost Breeze po TID, each supplement provides 250 kcal and 9 grams of protein Encourage Family to bring in food from outside  MVI with minerals daily High calorie high protein handout in AVS  *Pt does not like Ensure   NUTRITION DIAGNOSIS:   Severe Malnutrition related to acute illness as evidenced by severe muscle depletion, mild fat depletion (She was able to get up to 111 lbs however since being sick she has lost 12 lbs, 10% in the last couple of weeks.). -  New dx  GOAL:   Patient will meet greater than or equal to 90% of their needs - Progressing   MONITOR:   PO intake, Supplement acceptance, Labs, Weight trends, Skin  REASON FOR ASSESSMENT:   Ventilator, Consult Enteral/tube feeding initiation and management  ASSESSMENT:   Pt with hx of CAD s/p CABGx3, DM type 2, GERD, CKD3a, HLD, and HTN, presented to ED with severe diarrhea ongoing for several days. Admitted for management of hypoglycemia and diverticulitis. Rapid response called 8/28 due to possible anaphylaxis, pt intubated and transferred to ICU, extubated 8/30.  8/27 - presented to ED  8/28 - tongue swelling, intubated for airway protection, transferred to ICU  8/29 -Tube feeds started via OGT 8/30 - Extubated, tube feeds discontinued, carb modified diet, thin liquids   Pt resting in bed eating breakfast, Husband at bedside. Pt was eating well 2 days ago, eating around 50% of her meals. However, pt did not eat anything yesterday as she felt bad and was in pain. Pt also reports having a sore throat post extubation that has been stopping her from eating. Throat spray and lozenges have not helped. Encouraged small  frequent snacks and warm/cold liquids. Encouraged use of ONS. Pt does not like the Ensues and does not drink them, will switch to [employer].  Pt was on a carb modified diet, CBG's have been running high ranging from 120-283 mg/dL in the last 24 hours. PT and family agree she may eat more if her diet was liberalized. Will liberalize diet as CBG's should be controlled by insulin  managed by the MD during admission. Encouraged pt to not go overboard with sugar sweetened beverages and food.   Husband reports before getting sick a couple weeks ago she was gaining weight and had a good appetite. She was able to get up to 111 lbs however since being sick she has lost 12 lbs, 10% in the last couple of weeks. This is significant for the time frame and along with pt's muscle and fat depletions, she meets criteria for acute severe malnutrition.   No nausea, vomiting, abdominal pain. Did have blood in stool this morning. Denies any food allergies.    Admit weight: 47.2 kg Current weight: 45.1 kg   Average Meal Intake: 8/31: 36% intake x 3 recorded meals 9/1: 0% intake x 3 recorded meals   Nutritionally Relevant Medications: Scheduled Meds:  famotidine   20 mg Oral Daily   feeding supplement  1 Container Oral TID BM   multivitamin with minerals  1 tablet Oral Daily   Labs Reviewed: BUN 38 Creatinine 1.57 GFR 35 CBG ranges from 120-283 mg/dL over the last 24 hours HgbA1c 7.9  Diet Order:   Diet Order  Diet regular Room service appropriate? Yes with Assist; Fluid consistency: Thin  Diet effective now                   EDUCATION NEEDS:   Education needs have been addressed  Skin:  Skin Assessment: Reviewed RN Assessment  Last BM:  9/2, type 7  Height:   Ht Readings from Last 1 Encounters:  07/04/24 5' 3 (1.6 m)    Weight:   Wt Readings from Last 1 Encounters:  07/08/24 45.1 kg    Ideal Body Weight:  52.3 kg  BMI:  Body mass index is 17.61  kg/m.  Estimated Nutritional Needs:   Kcal:  1400-1600 kcal/d  Protein:  70-95 g/d  Fluid:  >1.5L/d   Olivia Kenning, RD Registered Dietitian  See Amion for more information

## 2024-07-08 NOTE — Progress Notes (Signed)
 PROGRESS NOTE    Jasmine Keith  FMW:[id number] DOB: 11-Aug-1951 DOA: 07/02/2024 PCP: Dorina Dallas RIGGERS   Brief Narrative:   73 year old female admitted to the hospital secondary to abdominal pain and diarrhea. CT abdomen/pelvis was obtained and showed diverticulitis. In the ED, she received IV antibiotics with Ceftriaxone  and Flagyl . She was admitted to the medical floor and developed acute onset shortness of breath and tongue swelling. She was experiencing respiratory distress and a rapid response was called. PCCM was consulted and patient was intubated for airway protection. She was subsequently extubated on 8/30. TRH assumed care on 8/31. Clinically improving.    Assessment & Plan:  Principal Problem:   Diverticulitis Active Problems:   Uncontrolled type 2 diabetes mellitus with hypoglycemia, with long-term current use of insulin  (HCC)   Angioedema   Malnutrition of moderate degree   Protein-calorie malnutrition, severe   Acute Hypoxic Respiratory Failure S/p intubation with subsequent extubation No evidence of acute hypoxia at this point  Swollen RUE: -Ordered US  venous duplex which showed evidence of acute right superficial cephalic vein thrombosis but no DVT.   CAD s/p CABG Troponin elevation (600 ? 1400) with EKG showing ST changes in V1-V2. Cardiology consulted -Troponin peaked at 1493 - TTE reassuring, appreciate cardiology evaluation  Distributive shock,resolved Angioedema  Suspect shock due to sedation, possible contribution of anaphylaxis.  Consider component of sepsis with her diverticulitis.  Pressors off - Augmentin  discontinued - Dced IV solumedrol on 9/2 - Benadryl , Pepcid  scheduled   Acute uncomplicated diverticulitis without perforation: -Afebrile, hemodynamically stable, no leukocytosis.  Continue with aztreonam  and metronidazole .   - Plan is to complete 7 days of total antibiotics  Extensive erythematous rash: Involving b/l eye lids, anterior  chest, both arms and legs. Continue with anti-allergics and benadryl  Needs outpatient follow up with dermatology and allergist   Acute tubular necrosis, resolved CKD stage IV Anion gap metabolic acidosis - Follow BMP, urine output closely - Avoid nephrotoxic medications and renal dose medications.  H/o diverticulosis and internal hemorrhoids,POA: Intermittent episodes of mild rectal bleeding.   Hx of SLE History of SLE on Plaquenil .  -Plaquenil  on hold   Hypothyroidism - Continue levothyroxine  25  Essential Hypertension: continue with amlodipine , hydralazine  and coreg .  Disposition: Lives at home with her husband and is IADL. Needs HHS on discharge.   DVT prophylaxis: SCDs Start: 07/03/24 1704 SCDs Start: 07/02/24 1456. Dced heparin  on 8/31 as patient has an episode of rectal bleeding, mild.     Code Status: Full Code Family Communication:  Spoke to husband on the phone on 8/31. Status is: Inpatient Remains inpatient appropriate because: Diverticulitis, Kidney dysfunction    Subjective:  No acute events overnight. Husband is present at the bedside. She feels better. Appetite is not so good but she is trying to eat. No evidence of rectal bleeding in the past 24 hours.   Examination:  General exam: Appears calm and comfortable  Respiratory system: Clear to auscultation. Respiratory effort normal. Cardiovascular system: S1 & S2 heard, RRR. No JVD, murmurs, rubs, gallops or clicks. No pedal edema. Gastrointestinal system: Abdomen is nondistended, soft and nontender. No organomegaly or masses felt. Normal bowel sounds heard. Central nervous system: Alert and oriented. No focal neurological deficits. Extremities: Symmetric 5 x 5 power. Swollen, erythematous right arm Skin: Swollen, erythematous right arm, evidence of erythematous rash over b/l eyelids, anterior chest, both arms, neck, as well as lower extremities. Psychiatry: Judgement and insight appear normal. Mood &  affect appropriate.  Diet Orders (From admission, onward)     Start     Ordered   07/08/24 0941  Diet regular Room service appropriate? Yes with Assist; Fluid consistency: Thin  Diet effective now       Question Answer Comment  Room service appropriate? Yes with Assist   Fluid consistency: Thin      07/08/24 0941            Objective: Vitals:   07/08/24 0002 07/08/24 0400 07/08/24 0500 07/08/24 0800  BP: 135/77 (!) 154/83  (!) 176/96  Pulse: 88 86  (!) 110  Resp: 18 16  17   Temp: 98.6 F (37 C) 98.2 F (36.8 C)  98.4 F (36.9 C)  TempSrc: Oral Oral  Oral  SpO2: 99% 100%  100%  Weight:   45.1 kg   Height:        Intake/Output Summary (Last 24 hours) at 07/08/2024 1307 Last data filed at 07/08/2024 1030 Gross per 24 hour  Intake 490 ml  Output 250 ml  Net 240 ml   Filed Weights   07/06/24 0500 07/07/24 0500 07/08/24 0500  Weight: 48.4 kg 48.4 kg 45.1 kg    Scheduled Meds:  amLODipine   5 mg Oral Daily   busPIRone   7.5 mg Oral BID   dicyclomine   10 mg Oral TID AC & HS   diphenhydrAMINE   25 mg Intravenous Q6H   famotidine   20 mg Oral Daily   feeding supplement  1 Container Oral TID BM   gabapentin   300 mg Oral Daily   hydrALAZINE   100 mg Oral TID   insulin  aspart  0-5 Units Subcutaneous QHS   insulin  aspart  0-9 Units Subcutaneous TID WC   levothyroxine   25 mcg Oral Q0600   loratadine   10 mg Oral Daily   methylPREDNISolone  (SOLU-MEDROL ) injection  40 mg Intravenous Daily   multivitamin with minerals  1 tablet Oral Daily   venlafaxine   37.5 mg Oral BID   Continuous Infusions:    Nutritional status Signs/Symptoms: severe muscle depletion, mild fat depletion (She was able to get up to 111 lbs however since being sick she has lost 12 lbs, 10% in the last couple of weeks.) Percent weight loss: 10 % Interventions: Refer to RD note for recommendations Body mass index is 17.61 kg/m.  Data Reviewed:   CBC: Recent Labs  Lab 07/03/24 1825  07/04/24 0330 07/05/24 0328 07/06/24 0900 07/07/24 1532  WBC 3.3* 2.8* 5.1 5.3 4.1  NEUTROABS 2.9 2.3 4.5 4.4 3.6  HGB 11.6* 10.3* 8.4* 9.3* 11.2*  HCT 34.7* 31.1* 26.8* 29.2* 33.6*  MCV 84.4 83.8 92.4 89.8 83.4  PLT 268 218 218 264 285   Basic Metabolic Panel: Recent Labs  Lab 07/03/24 1825 07/04/24 0330 07/05/24 0328 07/05/24 1850 07/06/24 0901 07/07/24 0716 07/08/24 0617  NA 134*   < > 133* 135 137 140 138  K 3.8   < > 4.4 4.4 4.0 4.2 4.1  CL 105   < > 106 107 110 111 104  CO2 19*   < > 13* 17* 15* 14* 19*  GLUCOSE 223*   < > 226* 178* 216* 122* 136*  BUN 24*   < > 47* 54* 52* 44* 38*  CREATININE 1.65*   < > 2.95* 2.75* 2.37* 1.76* 1.57*  CALCIUM  8.5*   < > 7.9* 8.1* 8.5* 8.6* 9.1  MG 1.7  --  3.0*  --   --   --   --   PHOS 4.1  --  7.7*  --   --   --   --    < > = values in this interval not displayed.   GFR: Estimated Creatinine Clearance: 23.4 mL/min (A) (by C-G formula based on SCr of 1.57 mg/dL (H)). Liver Function Tests: Recent Labs  Lab 07/04/24 0330 07/05/24 0328 07/06/24 0901 07/07/24 0716 07/08/24 0617  AST 30 24 34 42* 38  ALT 9 9 12 17 20   ALKPHOS 30* 34* 48 44 46  BILITOT 0.8 0.6 0.5 0.7 1.0  PROT 5.7* 5.3* 5.8* 6.0* 6.3*  ALBUMIN  2.4* 2.2* 2.5* 2.7* 2.7*   Recent Labs  Lab 07/02/24 1029  LIPASE 32   No results for input(s): AMMONIA in the last 168 hours. Coagulation Profile: No results for input(s): INR, PROTIME in the last 168 hours. Cardiac Enzymes: No results for input(s): CKTOTAL, CKMB, CKMBINDEX, TROPONINI in the last 168 hours. BNP (last 3 results) No results for input(s): PROBNP in the last 8760 hours. HbA1C: No results for input(s): HGBA1C in the last 72 hours. CBG: Recent Labs  Lab 07/07/24 2227 07/08/24 0019 07/08/24 0414 07/08/24 0801 07/08/24 1147  GLUCAP 179* 143* 134* 155* 264*   Lipid Profile: No results for input(s): CHOL, HDL, LDLCALC, TRIG, CHOLHDL, LDLDIRECT in the last 72  hours.  Thyroid  Function Tests: No results for input(s): TSH, T4TOTAL, FREET4, T3FREE, THYROIDAB in the last 72 hours. Anemia Panel: No results for input(s): VITAMINB12, FOLATE, FERRITIN, TIBC, IRON, RETICCTPCT in the last 72 hours. Sepsis Labs: Recent Labs  Lab 07/02/24 1302 07/02/24 1527 07/03/24 1825 07/05/24 1850  LATICACIDVEN 0.6 1.1 1.0 1.2    Recent Results (from the past 240 hours)  MRSA Next Gen by PCR, Nasal     Status: None   Collection Time: 07/03/24  5:01 PM   Specimen: Nasal Mucosa; Nasal Swab  Result Value Ref Range Status   MRSA by PCR Next Gen NOT DETECTED NOT DETECTED Final    Comment: (NOTE) The GeneXpert MRSA Assay (FDA approved for NASAL specimens only), is one component of a comprehensive MRSA colonization surveillance program. It is not intended to diagnose MRSA infection nor to guide or monitor treatment for MRSA infections. Test performance is not FDA approved in patients less than 45 years old. Performed at [facility], [address] [place], [place] [address]         Radiology Studies: VAS US  UPPER EXTREMITY VENOUS DUPLEX Result Date: 07/08/2024 UPPER VENOUS STUDY  Patient Name:  Jasmine Keith  Date of Exam:   07/08/2024 Medical Rec #: [id number]           Accession #:    [id number] Date of Birth: Sep 20, 1951          Patient Gender: F Patient Age:   83 years Exam Location:  [facility] Procedure:      VAS US  UPPER EXTREMITY VENOUS DUPLEX Referring Phys: DELILIAH Starasia Sinko --------------------------------------------------------------------------------  Indications: Pain, Swelling, Erythema, and tenderness. Comparison Study: No prior exam. Performing Technologist: Edilia Elden Appl  Examination Guidelines: A complete evaluation includes B-mode imaging, spectral Doppler, color Doppler, and power Doppler as needed of all accessible portions of each vessel. Bilateral testing is considered an integral part of a complete  examination. Limited examinations for reoccurring indications may be performed as noted.  Right Findings: +----------+------------+---------+-----------+----------+-------+ RIGHT     CompressiblePhasicitySpontaneousPropertiesSummary +----------+------------+---------+-----------+----------+-------+ IJV           Full       Yes  Yes                      +----------+------------+---------+-----------+----------+-------+ Subclavian    Full       Yes       Yes                      +----------+------------+---------+-----------+----------+-------+ Axillary      Full       Yes       Yes                      +----------+------------+---------+-----------+----------+-------+ Brachial      Full       Yes       Yes                      +----------+------------+---------+-----------+----------+-------+ Radial        Full                                          +----------+------------+---------+-----------+----------+-------+ Ulnar         Full                                          +----------+------------+---------+-----------+----------+-------+ Cephalic      None       No        No                       +----------+------------+---------+-----------+----------+-------+ Basilic       Full       Yes       Yes                      +----------+------------+---------+-----------+----------+-------+ Evidence of superficial vein thrombosis noted in the right cephalic vein from the upper arm to the distal forearm.  Left Findings: +----------+------------+---------+-----------+----------+-------+ LEFT      CompressiblePhasicitySpontaneousPropertiesSummary +----------+------------+---------+-----------+----------+-------+ IJV           Full       Yes       Yes                      +----------+------------+---------+-----------+----------+-------+ Subclavian    Full       Yes       Yes                       +----------+------------+---------+-----------+----------+-------+  Summary:  Right: No evidence of deep vein thrombosis in the upper extremity. Findings consistent with acute superficial vein thrombosis involving the right cephalic vein.  Left: No evidence of thrombosis in the subclavian.  *See table(s) above for measurements and observations.    Preliminary         LOS: 4 days   Time spent= 43 mins    Deliliah Room, MD [facility]  If 7PM-7AM, please contact night-coverage  07/08/2024, 1:07 PM

## 2024-07-08 NOTE — Plan of Care (Signed)

## 2024-07-08 NOTE — Inpatient Diabetes Management (Signed)
 Inpatient Diabetes Program Recommendations  AACE/ADA: New Consensus Statement on Inpatient Glycemic Control (2015)  Target Ranges:  Prepandial:   less than 140 mg/dL      Peak postprandial:   less than 180 mg/dL (1-2 hours)      Critically ill patients:  140 - 180 mg/dL   Lab Results  Component Value Date   GLUCAP 360 (H) 07/08/2024   HGBA1C 7.9 (H) 04/18/2024    Review of Glycemic Control  Latest Reference Range & Units 07/08/24 08:01 07/08/24 11:47 07/08/24 13:59  Glucose-Capillary 70 - 99 mg/dL 844 (H) 735 (H) 639 (H)   Diabetes history: DM 2 Outpatient Diabetes medications:  Lantus  30 units daily Current orders for Inpatient glycemic control:  Novolog  0-9 units tid with meals and HS  Inpatient Diabetes Program Recommendations:    Please consider adding Lantus  15 units daily.  (This is 1/2 of home dose).    Thanks,  Randall Bullocks, RN, BC-ADM Inpatient Diabetes Coordinator Pager [phone]  (8a-5p)

## 2024-07-08 NOTE — TOC CM/SW Note (Signed)
    Durable Medical Equipment  (From admission, onward)           Start     Ordered   07/08/24 1636  For home use only DME 4 wheeled rolling walker with seat  Once       Question:  Patient needs a walker to treat with the following condition  Answer:  Weakness   07/08/24 1635   07/08/24 1635  For home use only DME 3 n 1  Once        07/08/24 1635           Patient confined to a room with no bathroom therefore needs 3 in 1

## 2024-07-08 NOTE — Progress Notes (Addendum)
 Patient was incontinent for bowel movement had moderate amount of bleeding in stool.Notified the provider. She dose have pain at IV site but refused to remove and refused to get the new IV .Assessed the IV site mild redness and swelling present.
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
## 2024-07-08 NOTE — Progress Notes (Signed)
 Right upper extremity venous duplex has been competed.  Results can be found in chart review under CV Proc.  07/08/2024 12:54 PM  Jatziry Wechter Elden Appl, RVT.

## 2024-07-08 NOTE — Progress Notes (Addendum)
 Mobility Specialist Progress Note:    07/08/24 1115  Mobility  Activity Ambulated with assistance (In hallway)  Level of Assistance Contact guard assist, steadying assist  Assistive Device Front wheel walker  Distance Ambulated (ft) 60 ft  Activity Response Tolerated well  Mobility Referral Yes  Mobility visit 1 Mobility  Mobility Specialist Start Time (ACUTE ONLY) 1052  Mobility Specialist Stop Time (ACUTE ONLY) 1107  Mobility Specialist Time Calculation (min) (ACUTE ONLY) 15 min   Received pt in bed and agreeable to mobility. No physical assistance needed. Pt required light VC for RW management. Distance limited d/t pt c/o BLE weakness. Returned to room without fault. Left pt in bed with alarm on. Personal belongings and call light within reach. All needs met.  Lavanda Pollack Mobility Specialist  Please contact via [employer] or  Rehab Office [phone]

## 2024-07-08 NOTE — Progress Notes (Signed)
 Physical Therapy Treatment Patient Details Name: Jasmine Keith MRN: [id number] DOB: January 26, 1951 Today's Date: 07/08/2024   History of Present Illness The pt is a 73 yo female presenting 8/27 with abdominal pain and diarrhea. Admitted for management of hypoglycemia and diverticulitis. Rapid response called 8/28 due to possible anaphylaxis, pt intubated and transferred to ICU, extubated 8/30. Hospital course complicated by elevated troponin, cardiology consulted and suspect due to demand ischemia. RUE swelling noted, awaiting ultrasound to rule out DVT. 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    PT Comments  Pt received in bed, husband present. Worked on bed mobility from bed flat position as pt has regular bed at home, she does have a bed rail. Trained pt in use of rollator today to allow pt to have a seat when needed. Pt tolerated increased total ambulation distance when using rollator. Discussed consistent cues for safety with husband since this is a new skill for pt and she has STM deficits. Husband voiced understanding. Pt also tolerated there ex and seated balance activities today. Recommend 3-in-1 for home as pt's gait speed is not sufficient for getting into bathroom before having an accident. Recommend HHPT at d/c. PT will continue to follow.     If plan is discharge home, recommend the following: A little help with walking and/or transfers;A little help with bathing/dressing/bathroom;Assistance with cooking/housework;Direct supervision/assist for medications management;Direct supervision/assist for financial management;Assist for transportation;Help with stairs or ramp for entrance;Supervision due to cognitive status   Can travel by private vehicle        Equipment Recommendations  Rollator (4 wheels);BSC/3in1    Recommendations for Other Services       Precautions /  Restrictions Precautions Precautions: Fall Recall of Precautions/Restrictions: Impaired Precaution/Restrictions Comments: watch BP, stable this session. STM deficits contribute to fall risk Restrictions Weight Bearing Restrictions Per Provider Order: No     Mobility  Bed Mobility Overal bed mobility: Needs Assistance Bed Mobility: Supine to Sit, Sit to Supine     Supine to sit: Supervision Sit to supine: Supervision   General bed mobility comments: practiced getting up from bed flat. Pt has bed rail at home. She was able to come to sitting and return to supine with increased time    Transfers Overall transfer level: Needs assistance Equipment used: Rollator (4 wheels) Transfers: Sit to/from Stand Sit to Stand: Contact guard assist           General transfer comment: pt taught how to lock brakes on rollator before standing. Practiced standing from bed to rollator as well as from seat of rollator. Pt needs consistent cues but no physical assist, close guarding for balance    Ambulation/Gait Ambulation/Gait assistance: Min assist Gait Distance (Feet): 150 Feet (50', 50', 50') Assistive device: Rollator (4 wheels) Gait Pattern/deviations: Step-through pattern, Decreased stride length, Trunk flexed, Narrow base of support, Drifts right/left Gait velocity: decreased Gait velocity interpretation: <1.8 ft/sec, indicate of risk for recurrent falls   General Gait Details: pt taught how to squeeze brakes during ambulation to control speed of rollator. She did very well when giving brakes some pressure. She liked having a seat with her and knowing that she could sit down when tired. However, given that this is a new skill for her, spoke with husband about the need for supervision with it's use   Stairs  Wheelchair Mobility     Tilt Bed    Modified Rankin (Stroke Patients Only)       Balance Overall balance assessment: History of Falls, Needs  assistance Sitting-balance support: No upper extremity supported, Feet supported Sitting balance-Leahy Scale: Good Sitting balance - Comments: worked on catching at tossing in sitting   Standing balance support: Bilateral upper extremity supported, During functional activity, Reliant on assistive device for balance Standing balance-Leahy Scale: Poor Standing balance comment: no overt LOB today in standing                            Communication Communication Communication: No apparent difficulties  Cognition Arousal: Alert Behavior During Therapy: WFL for tasks assessed/performed   PT - Cognitive impairments: Awareness, Memory, Initiation, Problem solving, Sequencing, Safety/Judgement, History of cognitive impairments                       PT - Cognition Comments: has difficulty remembering new tasks so educated husband on giving her consistent cues to make safe use of rollator a rote activity Following commands: Impaired Following commands impaired: Follows one step commands with increased time    Cueing Cueing Techniques: Verbal cues  Exercises General Exercises - Lower Extremity Hip Flexion/Marching: AROM, Both, 10 reps, Seated    General Comments General comments (skin integrity, edema, etc.): VSS      Pertinent Vitals/Pain Pain Assessment Pain Assessment: Faces Faces Pain Scale: No hurt    Home Living                          Prior Function            PT Goals (current goals can now be found in the care plan section) Acute Rehab PT Goals Patient Stated Goal: to return home PT Goal Formulation: With patient Time For Goal Achievement: 07/20/24 Potential to Achieve Goals: Good Progress towards PT goals: Progressing toward goals    Frequency    Min 2X/week      PT Plan      Co-evaluation              AM-PAC PT 6 Clicks Mobility   Outcome Measure  Help needed turning from your back to your side while in a flat  bed without using bedrails?: A Little Help needed moving from lying on your back to sitting on the side of a flat bed without using bedrails?: A Little Help needed moving to and from a bed to a chair (including a wheelchair)?: A Little Help needed standing up from a chair using your arms (e.g., wheelchair or bedside chair)?: A Little Help needed to walk in hospital room?: A Little Help needed climbing 3-5 steps with a railing? : A Lot 6 Click Score: 17    End of Session Equipment Utilized During Treatment: Gait belt Activity Tolerance: Patient tolerated treatment well Patient left: in bed;with call bell/phone within reach;with bed alarm set;with family/visitor present Nurse Communication: Mobility status PT Visit Diagnosis: Unsteadiness on feet (R26.81);Other abnormalities of gait and mobility (R26.89);Repeated falls (R29.6);Muscle weakness (generalized) (M62.81)     Time: 8561-8479 PT Time Calculation (min) (ACUTE ONLY): 42 min  Charges:    $Gait Training: 8-22 mins $Therapeutic Exercise: 8-22 mins $Therapeutic Activity: 8-22 mins PT General Charges $$ ACUTE PT VISIT: 1 Visit  Richerd Lipoma, PT  Acute Rehab Services Secure chat preferred Office [phone]    Richerd LITTIE Lipoma 07/08/2024, 4:43 PM

## 2024-07-09 ENCOUNTER — Telehealth: Payer: Self-pay

## 2024-07-09 DIAGNOSIS — K5792 Diverticulitis of intestine, part unspecified, without perforation or abscess without bleeding: Secondary | ICD-10-CM | POA: Diagnosis not present

## 2024-07-09 LAB — CBC WITH DIFFERENTIAL/PLATELET
Basophils Absolute: 0 K/uL (ref 0.0–0.1)
Basophils Relative: 0 %
Eosinophils Absolute: 0.1 K/uL (ref 0.0–0.5)
Eosinophils Relative: 1 %
HCT: 29.3 % — ABNORMAL LOW (ref 36.0–46.0)
Hemoglobin: 9.7 g/dL — ABNORMAL LOW (ref 12.0–15.0)
Lymphocytes Relative: 13 %
Lymphs Abs: 0.8 K/uL (ref 0.7–4.0)
MCH: 27.8 pg (ref 26.0–34.0)
MCHC: 33.1 g/dL (ref 30.0–36.0)
MCV: 84 fL (ref 80.0–100.0)
Monocytes Absolute: 0.4 K/uL (ref 0.1–1.0)
Monocytes Relative: 7 %
Neutro Abs: 4.6 K/uL (ref 1.7–7.7)
Neutrophils Relative %: 79 %
Platelets: 312 K/uL (ref 150–400)
RBC: 3.49 MIL/uL — ABNORMAL LOW (ref 3.87–5.11)
RDW: 14.3 % (ref 11.5–15.5)
WBC: 5.8 K/uL (ref 4.0–10.5)
nRBC: 0 % (ref 0.0–0.2)

## 2024-07-09 LAB — BASIC METABOLIC PANEL WITH GFR
Anion gap: 10 (ref 5–15)
BUN: 50 mg/dL — ABNORMAL HIGH (ref 8–23)
CO2: 20 mmol/L — ABNORMAL LOW (ref 22–32)
Calcium: 9.1 mg/dL (ref 8.9–10.3)
Chloride: 106 mmol/L (ref 98–111)
Creatinine, Ser: 1.56 mg/dL — ABNORMAL HIGH (ref 0.44–1.00)
GFR, Estimated: 35 mL/min — ABNORMAL LOW (ref >60)
Glucose, Bld: 106 mg/dL — ABNORMAL HIGH (ref 70–99)
Potassium: 4 mmol/L (ref 3.5–5.1)
Sodium: 136 mmol/L (ref 135–145)

## 2024-07-09 LAB — GLUCOSE, CAPILLARY
Glucose-Capillary: 122 mg/dL — ABNORMAL HIGH (ref 70–99)
Glucose-Capillary: 135 mg/dL — ABNORMAL HIGH (ref 70–99)
Glucose-Capillary: 185 mg/dL — ABNORMAL HIGH (ref 70–99)
Glucose-Capillary: 95 mg/dL (ref 70–99)

## 2024-07-09 MED ORDER — DIPHENHYDRAMINE HCL 50 MG PO TABS: 50.0000 mg | ORAL_TABLET | Freq: Four times a day (QID) | ORAL | 0 refills | Status: AC | PRN

## 2024-07-09 MED ORDER — MENTHOL 3 MG MT LOZG: 1.0000 | LOZENGE | OROMUCOSAL | 12 refills | Status: AC | PRN

## 2024-07-09 MED ORDER — EPINEPHRINE 0.3 MG/0.3ML IJ SOAJ
0.3000 mg | Freq: Once | INTRAMUSCULAR | 1 refills | Status: AC | PRN
Start: 2024-07-09 — End: ?

## 2024-07-09 MED ORDER — LORATADINE 10 MG PO TABS: 10.0000 mg | ORAL_TABLET | Freq: Every day | ORAL | 0 refills | Status: AC

## 2024-07-09 NOTE — Progress Notes (Signed)
 Occupational Therapy Treatment Patient Details Name: Jasmine Keith MRN: [id number] DOB: 08-Jan-1951 Today's Date: 07/09/2024   History of present illness The pt is a 73 yo female presenting 8/27 with abdominal pain and diarrhea. Admitted for management of hypoglycemia and diverticulitis. Rapid response called 8/28 due to possible anaphylaxis, pt intubated and transferred to ICU, extubated 8/30. Hospital course complicated by elevated troponin, cardiology consulted and suspect due to demand ischemia. RUE swelling noted, awaiting ultrasound to rule out DVT. 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   OT comments  Patient seen in order to continue with cognitive assessment, and increase independence with ADLs and functional mobility. Patient found in the dark, attempting to eat breakfast. Patient agitated this morning, initally agreeable to therapy, however refusing to move breakfast tray placed over her lap (phone trapped underneath her) and head minimally elevated as her feet were all the way at the bottom of the bed. Patient refusing sitting EOB for medications or to eat breakfast stating, Im watching TV right now. Patient remains disoriented, with RN present for entire exchange. OT recommendation remains appropriate, will continue to follow.       If plan is discharge home, recommend the following:  A little help with walking and/or transfers;A little help with bathing/dressing/bathroom;Assistance with cooking/housework;Direct supervision/assist for medications management;Direct supervision/assist for financial management;Assist for transportation;Help with stairs or ramp for entrance;Supervision due to cognitive status   Equipment Recommendations  None recommended by OT    Recommendations for Other Services      Precautions / Restrictions Precautions Precautions: Fall Recall of  Precautions/Restrictions: Impaired Precaution/Restrictions Comments: watch BP, stable this session. STM deficits contribute to fall risk Restrictions Weight Bearing Restrictions Per Provider Order: No       Mobility Bed Mobility Overal bed mobility: Needs Assistance             General bed mobility comments: refused despite encouragement    Transfers Overall transfer level: Needs assistance                 General transfer comment: refused     Balance Overall balance assessment: History of Falls, Needs assistance                                         ADL either performed or assessed with clinical judgement   ADL Overall ADL's : Needs assistance/impaired Eating/Feeding: Set up;Sitting                                   Functional mobility during ADLs: Minimal assistance;Cueing for safety;Cueing for sequencing;Rolling walker (2 wheels) General ADL Comments: Patient seen in order to continue with cognitive assessment, and increase independence with ADLs and functional mobility. Patient found in the dark, attempting to eat breakfast. Patient agitated this morning, initally agreeable to therapy, however refusing to move breakfast tray placed over her lap (phone trapped underneath her) and head minimally elevated as her feet were all the way at the bottom of the bed. Patient refusing sitting EOB for medications or to eat breakfast stating, Im watching TV right now. Patient remains disoriented, with RN present for entire exchange. OT recommendation remains appropriate, will continue to follow.    Extremity/Trunk Assessment  Vision       Perception     Praxis     Communication Communication Communication: No apparent difficulties   Cognition Arousal: Alert Behavior During Therapy: Agitated Cognition: History of cognitive impairments, Cognition impaired   Orientation impairments: Place, Time,  Situation Awareness: Intellectual awareness impaired, Online awareness impaired Memory impairment (select all impairments): Short-term memory, Working [employer] fast streamer, Non-declarative long-term memory, Geneticist, molecular long-term memory Attention impairment (select first level of impairment): Focused attention Executive functioning impairment (select all impairments): Initiation, Organization, Sequencing, Reasoning, Problem solving OT - Cognition Comments: Agitated and with zero safety awareness, down at the bototm of the bed and with breakfast tray in lap, refusing repositioning despite safety concerns                 Following commands: Impaired Following commands impaired: Follows one step commands with increased time      Cueing   Cueing Techniques: Verbal cues  Exercises      Shoulder Instructions       General Comments VSS    Pertinent Vitals/ Pain       Pain Assessment Pain Assessment: Faces Faces Pain Scale: No hurt  Home Living                                          Prior Functioning/Environment              Frequency  Min 2X/week        Progress Toward Goals  OT Goals(current goals can now be found in the care plan section)  Progress towards OT goals: Progressing toward goals  Acute Rehab OT Goals Patient Stated Goal: to watch TV OT Goal Formulation: Patient unable to participate in goal setting Time For Goal Achievement: 07/21/24 Potential to Achieve Goals: Fair  Plan      Co-evaluation                 AM-PAC OT 6 Clicks Daily Activity     Outcome Measure   Help from another person eating meals?: A Little Help from another person taking care of personal grooming?: A Little Help from another person toileting, which includes using toliet, bedpan, or urinal?: A Little Help from another person bathing (including washing, rinsing, drying)?: A Little Help from another person to put on and taking off regular upper body  clothing?: A Little Help from another person to put on and taking off regular lower body clothing?: A Little 6 Click Score: 18    End of Session    OT Visit Diagnosis: Unsteadiness on feet (R26.81);Other abnormalities of gait and mobility (R26.89);Repeated falls (R29.6);Muscle weakness (generalized) (M62.81);History of falling (Z91.81);Other symptoms and signs involving cognitive function   Activity Tolerance     Patient Left     Nurse Communication          Time: 9174-9160 OT Time Calculation (min): 14 min  Charges: OT General Charges $OT Visit: 1 Visit OT Treatments $Self Care/Home Management : 8-22 mins  Ronal Gift E. Ruthella Kirchman, OTR/L Acute Rehabilitation Services [phone]   Ronal Gift Salt 07/09/2024, 9:29 AM

## 2024-07-09 NOTE — Discharge Summary (Signed)
 Physician Discharge Summary  Jasmine Keith FMW:[id number] DOB: May 14, 1951 DOA: 07/02/2024  PCP: Dorina Loving, PA-C  Admit date: 07/02/2024 Discharge date: 07/09/2024 Recommendations for Outpatient Follow-up:  Follow up with PCP in 1 weeks-call for appointment Please obtain BMP/CBC in one week Follow-up with dermatology, allergy specialist  Discharge Dispo: home Discharge Condition: Stable Code Status:   Code Status: Full Code Diet recommendation:  Diet Order             Diet general           Diet regular Room service appropriate? Yes with Assist; Fluid consistency: Thin  Diet effective now                    Brief/Interim Summary: Jasmine Keith is a 73 y.o. year old female with PMH of CAD/CABG, CKD stage IV, history of SLE, diverticulosis, hypothyroidism hypertension admitted to the hospital due to abdominal pain, diarrhea.CT abdomen/pelvis was obtained and showed diverticulitis. In the ED, she received IV antibiotics with Ceftriaxone  and Flagyl  and was admitted to the medical floor and developed acute onset shortness of breath and tongue swelling, was experiencing respiratory distress and a rapid response was called. PCCM was consulted and patient was intubated for airway protection subsequently extubated on 8/30 and transferred to [facility] 8/31.  Patient has completed antibiotic, tolerating diet no nausea vomiting fever chills.  Hemodynamics remained stable.  Rash is improving.  She completed a steroid and at this time remains stable for discharge home  Subjective: Seen and examined today Overnight afebrile BP 120-150 systolic, on room air Labs reviewed-creatinine about the same at 1.5, hemoglobin stable 9.7 She has been eating well and eager to go home.  Discharge diagnosis:   Acute Hypoxic Respiratory Failure Angioedema likely due to Augmentin  Distributive shock-resolved Patient had angioedema/allergic reaction with respiratory failure angioedema and distributive  shock.Intubated for airway protection with subsequent extubation.  Had distributive shock that resolved.  Augmentin  discontinued.  Initially was in ICU with pressors, transferred to [facility] 8/31.  Completed steroid 9/1, continue Pepcid , Benadryl  overall symptoms has improved.Respiratory status stable, on room air.   RUE swelling: US  venous duplex which showed evidence of acute right superficial cephalic vein thrombosis but no DVT.   CAD s/p CABG Elevated troponin due to demand ischemia troponin peaked at 1493 Troponin elevation (600 ? 1400) with EKG showing ST changes in V1-V2. Cardiology consulted, likely demand ischemia, echocardiogram reassuring, no further plan.  Continue home Coreg ,   Acute uncomplicated diverticulitis without perforation H/o diverticulosis and internal hemorrhoids,POA: Intermittent episodes of mild rectal bleeding: Component of hypotension in the setting of diverticulitis along with distributive shock. Augmentin  discontinued due to allergic reaction, at this time no leukocytosis and no fever.   Initially received ceftriaxone  and then Augmentin  subsequently given aztreonam  and Flagyl  completed 8/31 while in ICU No abdominal pain tolerating diet no nausea vomiting fever chills he remains asymptomatic. She does have outpatient GI and had recent colonoscopy, she is in agreement to follow-up with her GI physician and discuss about colonoscopy post diverticulitis.  Extensive erythematous rash: Involving b/l eye lids, anterior chest, both arms and legs.  Continuing on anti-allergics and benadryl  Needs outpatient follow up with dermatology and allergist.  Solu-Medrol  discontinued 9/2 Overall rash has significantly improved as per the husband and aware about the follow-up she needs   Acute tubular necrosis, resolved CKD stage IV Anion gap metabolic acidosis [name] remains stable at 1.5.  Monitor, avoid nephrotoxic medication. monitor renal function   Hx  of SLE History of SLE on  Plaquenil  on hold   Hypothyroidism  Continue levothyroxine  25   Essential Hypertension: Stable continue with amlodipine , hydralazine  and coreg .  Severe malnutrition with Body mass index is 17.61 kg/m.: Nutrition Problem: Severe Malnutrition Etiology: acute illness Signs/Symptoms: severe muscle depletion, mild fat depletion (She was able to get up to 111 lbs however since being sick she has lost 12 lbs, 10% in the last couple of weeks.) Percent weight loss: 10 % Interventions: Refer to RD note for recommendations   Mobility: PT Orders: Active  PT Follow up Rec: Other (Comment) ([facility])07/08/2024 1634   DVT prophylaxis: SCDs Start: 07/03/24 1704 SCDs Start: 07/02/24 1456 Code Status:   Code Status: Full Code Family Communication: plan of care discussed with patient at bedside. Patient status is: Remains hospitalized because of severity of illness Level of care: Med-Surg   Dispo: The patient is from: home            Anticipated disposition: home today.  Patient is eager to go home today. Objective: Vitals last 24 hrs: Vitals:   07/08/24 1600 07/08/24 2029 07/09/24 0455 07/09/24 0747  BP: (!) 159/62 (!) 146/64 (!) 120/40 (!) 151/68  Pulse: 74 73 71 80  Resp:    16  Temp:  98.4 F (36.9 C) 98.2 F (36.8 C) 99.3 F (37.4 C)  TempSrc:  Oral Oral Oral  SpO2:  100% 99% 100%  Weight:      Height:        Physical Examination: General exam: alert awake, oriented, older than stated age HEENT:Oral mucosa moist, Ear/Nose WNL grossly Respiratory system: Bilaterally clear BS,no use of accessory muscle Cardiovascular system: S1 & S2 +, No JVD. Gastrointestinal system: Abdomen soft,NT,ND, BS+ Nervous System: Alert, awake, moving all extremities,and following commands. Extremities: LE edema neg, distal extremities warm.  Skin: rashes medial thigh over eyes and back- which is much better, no icterus. MSK: Normal muscle bulk,tone, power   Medications reviewed:  Scheduled Meds:   amLODipine   5 mg Oral Daily   busPIRone   7.5 mg Oral BID   carvedilol   6.25 mg Oral BID WC   dicyclomine   10 mg Oral TID AC & HS   diphenhydrAMINE   25 mg Intravenous Q6H   famotidine   20 mg Oral Daily   feeding supplement  1 Container Oral TID BM   gabapentin   300 mg Oral Daily   hydrALAZINE   100 mg Oral TID   insulin  aspart  0-5 Units Subcutaneous QHS   insulin  aspart  0-9 Units Subcutaneous TID WC   insulin  glargine  15 Units Subcutaneous Daily   levothyroxine   25 mcg Oral Q0600   loratadine   10 mg Oral Daily   multivitamin with minerals  1 tablet Oral Daily   venlafaxine   37.5 mg Oral BID   Continuous Infusions: Diet: Diet Order             Diet general           Diet regular Room service appropriate? Yes with Assist; Fluid consistency: Thin  Diet effective now                      Consultation: See note.  Discharge Instructions  Discharge Instructions     Diet general   Complete by: As directed    Discharge instructions   Complete by: As directed    Please call call MD or return to ER for similar or worsening recurring problem that brought  you to hospital or if any fever,nausea/vomiting,abdominal pain, uncontrolled pain, chest pain,  shortness of breath or any other alarming symptoms.  Please follow-up your doctor as instructed in a week time and call the office for appointment.  Please avoid alcohol, smoking, or any other illicit substance and maintain healthy habits including taking your regular medications as prescribed.  You were cared for by a hospitalist during your hospital stay. If you have any questions about your discharge medications or the care you received while you were in the hospital after you are discharged, you can call the unit and ask to speak with the hospitalist on call if the hospitalist that took care of you is not available.  Once you are discharged, your primary care physician will handle any further medical issues. Please note that NO  REFILLS for any discharge medications will be authorized once you are discharged, as it is imperative that you return to your primary care physician (or establish a relationship with a primary care physician if you do not have one) for your aftercare needs so that they can reassess your need for medications and monitor your lab values   Increase activity slowly   Complete by: As directed       Allergies as of 07/09/2024       Reactions   Augmentin  [amoxicillin -pot Clavulanate] Anaphylaxis   Lipitor [atorvastatin ] Other (See Comments)   myalgias   Lovastatin Other (See Comments)   myalgias   Rosuvastatin Other (See Comments)   myalgia        Medication List     STOP taking these medications    hydroxychloroquine  200 MG tablet Commonly known as: PLAQUENIL        TAKE these medications    acetaminophen  325 MG tablet Commonly known as: TYLENOL  Take 2 tablets (650 mg total) by mouth every 6 (six) hours as needed for mild pain (or Fever >/= 101).   alendronate  70 MG tablet Commonly known as: FOSAMAX  TAKE 1 TABLET BY MOUTH ONCE A WEEK. TAKE WITH A FULL GLASS OF WATER ON AN EMPTY STOMACH.   amLODipine  5 MG tablet Commonly known as: NORVASC  TAKE 1 TABLET (5 MG TOTAL) BY MOUTH DAILY.   BD Pen Needle Nano U/F 32G X 4 MM Misc Generic drug: Insulin  Pen Needle Use one needle daily to inject insulin    Pen Needles 3/16 31G X 5 MM Misc Inject 8 Units into the skin daily.   busPIRone  7.5 MG tablet Commonly known as: BUSPAR  TAKE 1 TABLET BY MOUTH 2 TIMES DAILY.   carvedilol  6.25 MG tablet Commonly known as: COREG  TAKE 1 TABLET BY MOUTH TWICE A DAY WITH FOOD   cyclobenzaprine  5 MG tablet Commonly known as: FLEXERIL  Take 1 tablet (5 mg total) by mouth at bedtime.   dicyclomine  10 MG capsule Commonly known as: BENTYL  TAKE 1 CAPSULE (10 MG TOTAL) BY MOUTH 4 TIMES A DAY BEFORE MEALS AND AT BEDTIME   diphenhydrAMINE  50 MG tablet Commonly known as: BENADRYL  Take 1 tablet (50  mg total) by mouth every 6 (six) hours as needed for itching or allergies.   EPINEPHrine  0.3 mg/0.3 mL Soaj injection Commonly known as: EPI-PEN Inject 0.3 mg into the muscle once as needed (acute allergic reaction).   fenofibrate  160 MG tablet TAKE 1 TABLET BY MOUTH EVERY DAY   fluticasone  50 MCG/ACT nasal spray Commonly known as: FLONASE  Place 2 sprays into both nostrils daily.   gabapentin  300 MG capsule Commonly known as: NEURONTIN  Take 1 capsule (300  mg total) by mouth 2 (two) times daily.   hydrALAZINE  100 MG tablet Commonly known as: APRESOLINE  Take 1 tablet (100 mg total) by mouth 3 (three) times daily.   inclisiran 284 MG/1.5ML Sosy injection Commonly known as: LEQVIO  Inject 1.5 mLs (284 mg total) into the skin every 6 (six) months.   Insulin  Glargine Solostar 100 UNIT/ML Solostar Pen Commonly known as: LANTUS  Inject 30 Units into the skin daily.   levothyroxine  25 MCG tablet Commonly known as: SYNTHROID  TAKE 1 TABLET BY MOUTH EVERY DAY BEFORE BREAKFAST   loratadine  10 MG tablet Commonly known as: CLARITIN  Take 1 tablet (10 mg total) by mouth daily for 7 days. Start taking on: July 10, 2024   Magnesium  250 MG Tabs Take 4 tablets by mouth daily.   meclizine  12.5 MG tablet Commonly known as: ANTIVERT  Take 1 tablet (12.5 mg total) by mouth 3 (three) times daily as needed for dizziness.   menthol -cetylpyridinium 3 MG lozenge Commonly known as: CEPACOL Take 1 lozenge (3 mg total) by mouth as needed for sore throat.   pantoprazole  40 MG tablet Commonly known as: PROTONIX  TAKE 1 TABLET BY MOUTH EVERY DAY   promethazine -dextromethorphan 6.25-15 MG/5ML syrup Commonly known as: PROMETHAZINE -DM Take 5 mLs by mouth 4 (four) times daily as needed.   valsartan  80 MG tablet Commonly known as: DIOVAN  Take 1 tablet (80 mg total) by mouth daily.   venlafaxine  XR 75 MG 24 hr capsule Commonly known as: EFFEXOR -XR Take 1 capsule (75 mg total) by mouth daily with  breakfast.               Durable Medical Equipment  (From admission, onward)           Start     Ordered   07/08/24 1636  For home use only DME 4 wheeled rolling walker with seat  Once       Question:  Patient needs a walker to treat with the following condition  Answer:  Weakness   07/08/24 1635   07/08/24 1635  For home use only DME 3 n 1  Once        07/08/24 1635            Follow-up Information     [place], [place], PA-C. Schedule an appointment as soon as possible for a visit in 1 week(s).   Specialties: Internal Medicine, Family Medicine Contact information: [address] [place] [address] [phone]         [facility] Follow up.   Specialty: Home Health Services Contact information: [address] [place] [address] [phone]                Allergies  Allergen Reactions   Augmentin  [Amoxicillin -Pot Clavulanate] Anaphylaxis   Lipitor [Atorvastatin ] Other (See Comments)    myalgias   Lovastatin Other (See Comments)    myalgias   Rosuvastatin Other (See Comments)    myalgia    The results of significant diagnostics from this hospitalization (including imaging, microbiology, ancillary and laboratory) are listed below for reference.    Microbiology: Recent Results (from the past 240 hours)  MRSA Next Gen by PCR, Nasal     Status: None   Collection Time: 07/03/24  5:01 PM   Specimen: Nasal Mucosa; Nasal Swab  Result Value Ref Range Status   MRSA by PCR Next Gen NOT DETECTED NOT DETECTED Final    Comment: (NOTE) The GeneXpert MRSA Assay (FDA approved for NASAL specimens  only), is one component of a comprehensive MRSA colonization surveillance program. It is not intended to diagnose MRSA infection nor to guide or monitor treatment for MRSA infections. Test performance is not FDA approved in patients less than 1 years old. Performed at [facility], [address], [place],  [place] [address]     Procedures/Studies: VAS US  UPPER EXTREMITY VENOUS DUPLEX Result Date: 07/08/2024 UPPER VENOUS STUDY  Patient Name:  Jasmine Keith  Date of Exam:   07/08/2024 Medical Rec #: [id number]           Accession #:    [id number] Date of Birth: 03/27/1951          Patient Gender: F Patient Age:   20 years Exam Location:  [facility] Procedure:      VAS US  UPPER EXTREMITY VENOUS DUPLEX Referring Phys: DELILIAH RASHID --------------------------------------------------------------------------------  Indications: Pain, Swelling, Erythema, and tenderness. Comparison Study: No prior exam. Performing Technologist: Edilia Elden Appl  Examination Guidelines: A complete evaluation includes B-mode imaging, spectral Doppler, color Doppler, and power Doppler as needed of all accessible portions of each vessel. Bilateral testing is considered an integral part of a complete examination. Limited examinations for reoccurring indications may be performed as noted.  Right Findings: +----------+------------+---------+-----------+----------+-------+ RIGHT     CompressiblePhasicitySpontaneousPropertiesSummary +----------+------------+---------+-----------+----------+-------+ IJV           Full       Yes       Yes                      +----------+------------+---------+-----------+----------+-------+ Subclavian    Full       Yes       Yes                      +----------+------------+---------+-----------+----------+-------+ Axillary      Full       Yes       Yes                      +----------+------------+---------+-----------+----------+-------+ Brachial      Full       Yes       Yes                      +----------+------------+---------+-----------+----------+-------+ Radial        Full                                          +----------+------------+---------+-----------+----------+-------+ Ulnar         Full                                           +----------+------------+---------+-----------+----------+-------+ Cephalic      None       No        No                       +----------+------------+---------+-----------+----------+-------+ Basilic       Full       Yes       Yes                      +----------+------------+---------+-----------+----------+-------+ Evidence of superficial vein thrombosis noted in  the right cephalic vein from the upper arm to the distal forearm.  Left Findings: +----------+------------+---------+-----------+----------+-------+ LEFT      CompressiblePhasicitySpontaneousPropertiesSummary +----------+------------+---------+-----------+----------+-------+ IJV           Full       Yes       Yes                      +----------+------------+---------+-----------+----------+-------+ Subclavian    Full       Yes       Yes                      +----------+------------+---------+-----------+----------+-------+  Summary:  Right: No evidence of deep vein thrombosis in the upper extremity. Findings consistent with acute superficial vein thrombosis involving the right cephalic vein.  Left: No evidence of thrombosis in the subclavian.  *See table(s) above for measurements and observations.  Diagnosing physician: Debby Robertson Electronically signed by Debby Robertson on 07/08/2024 at 4:46:49 PM.    Final    ECHOCARDIOGRAM COMPLETE Result Date: 07/04/2024    ECHOCARDIOGRAM REPORT   Patient Name:   ARTESIA BERKEY Date of Exam: 07/04/2024 Medical Rec #:  [id number]          Height:       63.0 in Accession #:    [id number]         Weight:       104.5 lb Date of Birth:  1951-08-26         BSA:          1.467 m Patient Age:    72 years           BP:           132/51 mmHg Patient Gender: F                  HR:           62 bpm. Exam Location:  Inpatient Procedure: 2D Echo, Cardiac Doppler and Color Doppler (Both Spectral and Color            Flow Doppler were utilized during procedure). Indications:    Dyspnea  R06.00  History:        Patient has prior history of Echocardiogram examinations, most                 recent 09/22/2022. Prior CABG; Risk Factors:Diabetes and                 Hypertension.  Sonographer:    [name] Referring Phys: [id number] KHABIB DGAYLI IMPRESSIONS  1. Left ventricular ejection fraction, by estimation, is 70 to 75%. Left ventricular ejection fraction by PLAX is 71 %. The left ventricle has hyperdynamic function. The left ventricle has no regional wall motion abnormalities. There is moderate left ventricular hypertrophy. Left ventricular diastolic parameters are consistent with Grade I diastolic dysfunction (impaired relaxation).  2. Right ventricular systolic function is normal. The right ventricular size is normal. Tricuspid regurgitation signal is inadequate for assessing PA pressure.  3. The mitral valve is grossly normal. Trivial mitral valve regurgitation.  4. The aortic valve is tricuspid. Aortic valve regurgitation is not visualized.  5. The inferior vena cava is normal in size with greater than 50% respiratory variability, suggesting right atrial pressure of 3 mmHg. Comparison(s): Changes from prior study are noted. 09/22/2022: LVEF >75%, RVSP 25.3 mmHg. FINDINGS  Left Ventricle: Left ventricular ejection fraction, by estimation, is 70 to 75%. Left ventricular  ejection fraction by PLAX is 71 %. The left ventricle has hyperdynamic function. The left ventricle has no regional wall motion abnormalities. The left ventricular internal cavity size was normal in size. There is moderate left ventricular hypertrophy. Left ventricular diastolic parameters are consistent with Grade I diastolic dysfunction (impaired relaxation). Indeterminate filling pressures. Right Ventricle: The right ventricular size is normal. No increase in right ventricular wall thickness. Right ventricular systolic function is normal. Tricuspid regurgitation signal is inadequate for assessing PA pressure. Left Atrium: Left  atrial size was normal in size. Right Atrium: Right atrial size was normal in size. Pericardium: There is no evidence of pericardial effusion. Mitral Valve: The mitral valve is grossly normal. Trivial mitral valve regurgitation. Tricuspid Valve: The tricuspid valve is grossly normal. Tricuspid valve regurgitation is trivial. Aortic Valve: The aortic valve is tricuspid. Aortic valve regurgitation is not visualized. Aortic valve mean gradient measures 3.0 mmHg. Aortic valve peak gradient measures 5.3 mmHg. Aortic valve area, by VTI measures 2.14 cm. Pulmonic Valve: The pulmonic valve was normal in structure. Pulmonic valve regurgitation is not visualized. Aorta: The aortic root and ascending aorta are structurally normal, with no evidence of dilitation. Venous: The inferior vena cava is normal in size with greater than 50% respiratory variability, suggesting right atrial pressure of 3 mmHg. IAS/Shunts: No atrial level shunt detected by color flow Doppler.  LEFT VENTRICLE PLAX 2D LV EF:         Left            Diastology                ventricular     LV e' medial:    7.07 cm/s                ejection        LV E/e' medial:  12.3                fraction by     LV e' lateral:   11.40 cm/s                PLAX is 71      LV E/e' lateral: 7.6                %. LVIDd:         3.10 cm LVIDs:         1.90 cm LV PW:         1.30 cm LV IVS:        1.40 cm LVOT diam:     1.70 cm LV SV:         55 LV SV Index:   37 LVOT Area:     2.27 cm  LEFT ATRIUM             Index        RIGHT ATRIUM           Index LA Vol (A2C):   39.2 ml 26.71 ml/m  RA Area:     10.00 cm LA Vol (A4C):   27.7 ml 18.88 ml/m  RA Volume:   16.70 ml  11.38 ml/m LA Biplane Vol: 34.7 ml 23.65 ml/m  AORTIC VALVE AV Area (Vmax):    2.15 cm AV Area (Vmean):   2.12 cm AV Area (VTI):     2.14 cm AV Vmax:           115.00 cm/s AV Vmean:          81.100 cm/s  AV VTI:            0.256 m AV Peak Grad:      5.3 mmHg AV Mean Grad:      3.0 mmHg LVOT Vmax:          109.00 cm/s LVOT Vmean:        75.600 cm/s LVOT VTI:          0.241 m LVOT/AV VTI ratio: 0.94  AORTA Ao Root diam: 2.70 cm MITRAL VALVE MV Area (PHT): 2.26 cm     SHUNTS MV Decel Time: 335 msec     Systemic VTI:  0.24 m MV E velocity: 87.00 cm/s   Systemic Diam: 1.70 cm MV A velocity: 109.00 cm/s MV E/A ratio:  0.80 Vinie Maxcy MD Electronically signed by Vinie Maxcy MD Signature Date/Time: 07/04/2024/12:22:44 PM    Final    CT SOFT TISSUE NECK W CONTRAST Result Date: 07/03/2024 CLINICAL DATA:  Initial evaluation for suspected epiglottitis or tonsillitis. EXAM: CT NECK WITH CONTRAST TECHNIQUE: Multidetector CT imaging of the neck was performed using the standard protocol following the bolus administration of intravenous contrast. RADIATION DOSE REDUCTION: This exam was performed according to the departmental dose-optimization program which includes automated exposure control, adjustment of the mA and/or kV according to patient size and/or use of iterative reconstruction technique. CONTRAST:  65mL OMNIPAQUE  IOHEXOL  350 MG/ML SOLN COMPARISON:  None Available. FINDINGS: Pharynx and larynx: Patient is intubated with endotracheal and enteric tubes in place, limiting assessment. Additionally, assessment somewhat limited by streak artifact from dental amalgam. The oral tongue appears diffusely swollen, partially deviated to the right by the endotracheal to and protruding through the mouth. No visible collections or mass lesion. Finding raises the possibility for an acute inflammatory process/angioedema. The palatine tonsils themselves are difficult to visualize due to intubation, but appear grossly symmetric and within normal limits. No visible tonsillar or peritonsillar abscess. Fluid signal intensity present within the nasopharynx related to intubation. Nasopharynx otherwise unremarkable. There is suspected mucosal edema about the oropharyngeal mucosa, largely collapsed about the endotracheal tube, suspected to  reflect associated supraglottitis, likely due to angio edema. Associated hazy inflammatory stranding within the parapharyngeal and submandibular spaces bilaterally. Glottis grossly symmetric inferiorly. Subglottic airway clear. Endotracheal and enteric tubes appear appropriately position. Salivary glands: Salivary glands including the parotid and submandibular glands are within normal limits. Thyroid : Normal. Lymph nodes: No enlarged or pathologic adenopathy within the neck. Vascular: Atheromatous change about the aortic arch, carotid bifurcations, and skull base. 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 Limited intracranial: Unremarkable. Visualized orbits: Unremarkable. Mastoids and visualized paranasal sinuses: Mild mucosal thickening present about the ethmoidal air cells. Paranasal sinuses are otherwise clear. Mastoid air cells and middle ear cavities are well pneumatized and free of fluid. Skeleton: No worrisome osseous lesions. Moderate spondylosis at C5-6 and C6-7. Prior sternotomy noted. Upper chest: No other acute finding. Partially visualized lungs are clear. Other: None. IMPRESSION: 1. Diffuse swelling of the oral tongue with suspected edema throughout the oropharyngeal mucosa. Findings raise the possibility for an acute inflammatory process/angioedema. The oropharyngeal airway is collapsed around the endotracheal tube. No visible collections or mass lesion. 2. Endotracheal and enteric tubes in satisfactory position. 3. Occlusion of the left vertebral artery at its origin. 4.  Aortic Atherosclerosis (ICD10-I70.0). Electronically Signed   By: Morene Hoard M.D.   On: 07/03/2024 21:45   DG Chest Port 1 View Result Date: 07/03/2024 CLINICAL DATA:  Respiratory failure.  Orogastric tube placement. EXAM: PORTABLE CHEST 1 VIEW COMPARISON:  June 10, 2024. FINDINGS: Stable cardiomediastinal silhouette. Sternotomy wires are noted. Endotracheal tube is in  grossly good position. Nasogastric tube tip is seen in expected position of distal stomach. Lungs are clear. Bony thorax is unremarkable. IMPRESSION: Endotracheal and nasogastric tubes are in grossly good position. No acute cardiopulmonary disease. Electronically Signed   By: Lynwood Landy Raddle M.D.   On: 07/03/2024 17:43   CT ABDOMEN PELVIS W CONTRAST Result Date: 07/02/2024 CLINICAL DATA:  Lower abdominal pain, melena EXAM: CT ABDOMEN AND PELVIS WITH CONTRAST TECHNIQUE: Multidetector CT imaging of the abdomen and pelvis was performed using the standard protocol following bolus administration of intravenous contrast. RADIATION DOSE REDUCTION: This exam was performed according to the departmental dose-optimization program which includes automated exposure control, adjustment of the mA and/or kV according to patient size and/or use of iterative reconstruction technique. CONTRAST:  75mL OMNIPAQUE  IOHEXOL  350 MG/ML SOLN COMPARISON:  None Available. FINDINGS: Lower chest: Cardiomegaly. Status post. Dependent atelectasis in both lower lobes. No acute process. Hepatobiliary: Normal hepatic contour and morphology. No discrete hepatic lesions. Normal appearance of the gallbladder. No intra or extrahepatic biliary ductal dilatation. Pancreas: Unremarkable. No pancreatic ductal dilatation or surrounding inflammatory changes. Spleen: Normal in size without focal abnormality. Adrenals/Urinary Tract: Normal adrenal glands. No hydronephrosis, nephrolithiasis or enhancing renal mass. 1.5 cm simple cyst in the interpolar left kidney. No imaging follow-up is recommended. Ureters and bladder are unremarkable. Stomach/Bowel: Diffuse submucosal edema of the rectum and sigmoid colon in a region with multiple small diverticula. Inflammatory stranding present adjacent to the involved sigmoid colon and rectum. Findings are most consistent with acute diverticulitis and proctitis. No evidence of free air or abscess. No obstruction.  Vascular/Lymphatic: Extensive calcified atherosclerotic plaque throughout the abdominal aorta. Fusiform aneurysmal dilation of the infrarenal abdominal aorta with a maximal diameter of 2.8 cm. 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 Severe calcified plaque results in high-grade stenosis of the left common iliac artery. Probable occlusion of both hypogastric arteries. No suspicious lymphadenopathy. Reproductive: Uterus and bilateral adnexa are unremarkable. Other: No abdominal wall hernia or abnormality. No abdominopelvic ascites. Musculoskeletal: No acute fracture or aggressive appearing lytic or blastic osseous lesion. IMPRESSION: 1. CT findings are consistent with acute uncomplicated sigmoid diverticulitis with additional inflammatory changes extending to the upper rectum. No evidence of perforation or abscess. 2. Fusiform infrarenal abdominal aortic aneurysm with a maximal diameter of 2.8 cm. Recommend follow-up ultrasound every 5 years. (Ref.: J Vasc Surg. 2018; 67:2-77 and J Am Coll Radiol 2013;10(10):789-794.) 3. 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 4. Cardiomegaly. 5. Additional ancillary findings as above. Electronically Signed   By: Wilkie Lent M.D.   On: 07/02/2024 13:26   DG Chest 2 View Result Date: 06/18/2024 CLINICAL DATA:  Cough and pharyngitis. EXAM: CHEST - 2 VIEW COMPARISON:  11/14/2023 and older studies. FINDINGS: Stable changes from prior CABG surgery. Cardiac silhouette is normal in size and configuration. Normal mediastinal and hilar contours. Clear lungs.  No pleural effusion or pneumothorax. Skeletal structures are intact. IMPRESSION: No active cardiopulmonary disease. Electronically Signed   By: Alm Parkins M.D.   On: 06/18/2024 08:48    Labs: BNP (last 3 results) No results for input(s): BNP in the last 8760 hours. Basic Metabolic Panel: Recent  Labs  Lab 07/03/24 1825 07/04/24 0330 07/05/24 0328 07/05/24 1850 07/06/24 0901 07/07/24 0716 07/08/24 0617 07/09/24  0225  NA 134*   < > 133* 135 137 140 138 136  K 3.8   < > 4.4 4.4 4.0 4.2 4.1 4.0  CL 105   < > 106 107 110 111 104 106  CO2 19*   < > 13* 17* 15* 14* 19* 20*  GLUCOSE 223*   < > 226* 178* 216* 122* 136* 106*  BUN 24*   < > 47* 54* 52* 44* 38* 50*  CREATININE 1.65*   < > 2.95* 2.75* 2.37* 1.76* 1.57* 1.56*  CALCIUM  8.5*   < > 7.9* 8.1* 8.5* 8.6* 9.1 9.1  MG 1.7  --  3.0*  --   --   --   --   --   PHOS 4.1  --  7.7*  --   --   --   --   --    < > = values in this interval not displayed.   Liver Function Tests: Recent Labs  Lab 07/04/24 0330 07/05/24 0328 07/06/24 0901 07/07/24 0716 07/08/24 0617  AST 30 24 34 42* 38  ALT 9 9 12 17 20   ALKPHOS 30* 34* 48 44 46  BILITOT 0.8 0.6 0.5 0.7 1.0  PROT 5.7* 5.3* 5.8* 6.0* 6.3*  ALBUMIN  2.4* 2.2* 2.5* 2.7* 2.7*   No results for input(s): LIPASE, AMYLASE in the last 168 hours. No results for input(s): AMMONIA in the last 168 hours. CBC: Recent Labs  Lab 07/04/24 0330 07/05/24 0328 07/06/24 0900 07/07/24 1532 07/09/24 0225  WBC 2.8* 5.1 5.3 4.1 5.8  NEUTROABS 2.3 4.5 4.4 3.6 4.6  HGB 10.3* 8.4* 9.3* 11.2* 9.7*  HCT 31.1* 26.8* 29.2* 33.6* 29.3*  MCV 83.8 92.4 89.8 83.4 84.0  PLT 218 218 264 285 312   CBG: Recent Labs  Lab 07/08/24 2028 07/09/24 0002 07/09/24 0453 07/09/24 0806 07/09/24 1212  GLUCAP 224* 95 135* 122* 185*  No results for input(s): TSH, T4TOTAL, T3FREE, THYROIDAB in the last 72 hours.  Invalid input(s): FREET3 Urinalysis    Component Value Date/Time   COLORURINE YELLOW 07/07/2024 2258   APPEARANCEUR CLEAR 07/07/2024 2258   LABSPEC 1.009 07/07/2024 2258   PHURINE 6.0 07/07/2024 2258   GLUCOSEU >=500 (A) 07/07/2024 2258   HGBUR MODERATE (A) 07/07/2024 2258   BILIRUBINUR NEGATIVE 07/07/2024 2258   BILIRUBINUR small 11/14/2023 1115   KETONESUR NEGATIVE  07/07/2024 2258   PROTEINUR 100 (A) 07/07/2024 2258   UROBILINOGEN 0.2 11/14/2023 1115   NITRITE NEGATIVE 07/07/2024 2258   LEUKOCYTESUR TRACE (A) 07/07/2024 2258   Sepsis Labs Recent Labs  Lab 07/05/24 0328 07/06/24 0900 07/07/24 1532 07/09/24 0225  WBC 5.1 5.3 4.1 5.8   Microbiology Recent Results (from the past 240 hours)  MRSA Next Gen by PCR, Nasal     Status: None   Collection Time: 07/03/24  5:01 PM   Specimen: Nasal Mucosa; Nasal Swab  Result Value Ref Range Status   MRSA by PCR Next Gen NOT DETECTED NOT DETECTED Final    Comment: (NOTE) The GeneXpert MRSA Assay (FDA approved for NASAL specimens only), is one component of a comprehensive MRSA colonization surveillance program. It is not intended to diagnose MRSA infection nor to guide or monitor treatment for MRSA infections. Test performance is not FDA approved in patients less than 71 years old. Performed at [facility], [address], [place], [place] [address]    Time coordinating discharge: 35 minutes  SIGNED: Mennie LAMY, MD  [facility] 07/09/2024, 1:13 PM  If  7PM-7AM, please contact night-coverage [email or web address]

## 2024-07-09 NOTE — TOC Progression Note (Addendum)
 Transition of Care (TOC) - Progression Note   Patient's rollator and 3 in 1 for home at bedside.   Followed up on patient's home health history.   Per Channing with [facility] when they received referral for home health services , they submitted to insurance and was told she was already opened with another home health agency.   Renalda with [facility] , stated they were seeing patient in February of 2024.   NCM explained above to patient and provided [email or web address] list of home health agencies to patient. Patient's husband planning on visiting today. NCM will follow up with patient later today   Followed up with patient and husband regarding choice of home health agency. They would like [facility]. NCM secure chatted MD for orders and face to face .   Florence Browning at [facility] services of [facility] , they will review patient's chart and call NCM back . They are aware discharge is today . Leeroy with [facility] called back and accepted referral. She has EPIC and will watch for discharge summary. Husband and patient aware  Patient Details  Name: Jasmine Keith MRN: [id number] Date of Birth: 1951/06/21  Transition of Care [facility]) CM/SW Contact  Gradie Ohm, Powell Jansky, RN Phone Number: 07/09/2024, 9:03 AM  Clinical Narrative:       Expected Discharge Plan: Home w Home Health Services Barriers to Discharge: Continued Medical Work up               Expected Discharge Plan and Services   Discharge Planning Services: CM Consult Post Acute Care Choice: Home Health, Durable Medical Equipment Living arrangements for the past 2 months: Single Family Home Expected Discharge Date: 07/03/24               DME Arranged: BOB Finder rolling with seat DME Agency: [employer] Date DME Agency Contacted: 07/08/24 Time DME Agency Contacted: [phone] Representative spoke with at DME Agency: [facility] HH Arranged: PT            Social Drivers of Health (SDOH) Interventions SDOH Screenings   Food Insecurity: No Food Insecurity (07/02/2024)  Housing: Low Risk  (07/02/2024)  Transportation Needs: No Transportation Needs (07/02/2024)  Utilities: Not At Risk (07/02/2024)  Alcohol Screen: Low Risk  (02/04/2024)  Depression (PHQ2-9): Low Risk  (06/26/2024)  Financial Resource Strain: Low Risk  (02/04/2024)  Physical Activity: Inactive (02/04/2024)  Social Connections: Socially Isolated (07/02/2024)  Stress: Stress Concern Present (02/04/2024)  Tobacco Use: Medium Risk (07/03/2024)  Health Literacy: Adequate Health Literacy (02/04/2024)    Readmission Risk Interventions    09/27/2022    3:28 PM 09/22/2022   10:34 AM 09/06/2022   12:29 PM  Readmission Risk Prevention Plan  Transportation Screening Complete Complete Complete  HRI or Home Care Consult Complete    Social Work Consult for Recovery Care Planning/Counseling Complete    Medication Review Oceanographer) Complete Referral to Pharmacy Referral to Pharmacy  HRI or Home Care Consult  Complete Complete  SW Recovery Care/Counseling Consult  Complete Complete  Palliative Care Screening  Not Applicable Not Applicable  Skilled Nursing Facility  Not Applicable Complete

## 2024-07-09 NOTE — Telephone Encounter (Signed)
 Called Jasmine Keith but received no answer. Voicemail left in regards to patient

## 2024-07-09 NOTE — Telephone Encounter (Signed)
 Called and spoke to pts husband made an apt for next week 07/16/24    Copied from CRM #[id number]. Topic: General - Call Back - No Documentation >> Jul 09, 2024  2:31 PM Lauren C wrote: Reason for CRM: Pts husband [place] (on [place]) returning a call he said was from [place]. No call notes seen. Recent crm communication was with Stacyann Mcconaughy. [phone] (Mobile)

## 2024-07-09 NOTE — Telephone Encounter (Signed)
 Copied from CRM [id number]. Topic: General - Other >> Jul 09, 2024  1:24 PM Robinson H wrote: Reason for CRM: [facility] calling to see if Dallas Maxwell will be wiling to sign off on home health orders for patient, going out to see her tomorrow for evaluation of home services. Will send a plan of care for patient. Also needs last date of COVID vaccine.  [facility] [phone]

## 2024-07-09 NOTE — Progress Notes (Signed)
 Physical Therapy Treatment Patient Details Name: CHELSEE HOSIE MRN: [id number] DOB: 1951-06-05 Today's Date: 07/09/2024   History of Present Illness The pt is a 73 yo female presenting 8/27 with abdominal pain and diarrhea. Admitted for management of hypoglycemia and diverticulitis. Rapid response called 8/28 due to possible anaphylaxis, pt intubated and transferred to ICU, extubated 8/30. Hospital course complicated by elevated troponin, cardiology consulted and suspect due to demand ischemia. RUE swelling noted, awaiting ultrasound to rule out DVT. 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   PT Comments  Pt in bed upon arrival with husband present and agreeable to PT session. Rollator was delivered to the room with education and demonstration provided on how to adjust. Pt continues to need frequent cueing for safety with the rollator with husband providing feedback. Noted that pt tends to have a harder time keeping proximity to the rollator as she fatigues. Education provided on taking frequent seated rest breaks with pt verbalizing agreement. Discussed walking program at home and keeping up with exercises. Husband is able to provide 24/7 assist upon d/c home and feels comfortable providing cues for safety with rollator. Continue to recommend HHPT. Acute PT to follow.     If plan is discharge home, recommend the following: A little help with walking and/or transfers;A little help with bathing/dressing/bathroom;Assistance with cooking/housework;Direct supervision/assist for medications management;Direct supervision/assist for financial management;Assist for transportation;Help with stairs or ramp for entrance;Supervision due to cognitive status   Can travel by private vehicle      Yes  Equipment Recommendations  Rollator (4 wheels);BSC/3in1       Precautions / Restrictions  Precautions Precautions: Fall Recall of Precautions/Restrictions: Impaired Precaution/Restrictions Comments: watch BP, stable this session. STM deficits contribute to fall risk Restrictions Weight Bearing Restrictions Per Provider Order: No     Mobility  Bed Mobility Overal bed mobility: Needs Assistance Bed Mobility: Supine to Sit, Sit to Supine    Supine to sit: Supervision Sit to supine: Supervision       Transfers Overall transfer level: Needs assistance Equipment used: Rollator (4 wheels) Transfers: Sit to/from Stand Sit to Stand: Contact guard assist      General transfer comment: CGA for safety with cues to lock brakes prior to sit<>stand transfer. Consistent cues needed throughout    Ambulation/Gait Ambulation/Gait assistance: Min assist Gait Distance (Feet): 120 Feet Assistive device: Rollator (4 wheels) Gait Pattern/deviations: Step-through pattern, Decreased stride length, Trunk flexed, Narrow base of support, Drifts right/left Gait velocity: decreased     General Gait Details: With fatigue, pt needed cues for rollator proximity and to give brakes some pressure to keep rollator from sliding anteriorly. Husband giving cues throughout for safety      Balance Overall balance assessment: History of Falls, Needs assistance       Communication Communication Communication: No apparent difficulties  Cognition Arousal: Alert Behavior During Therapy: WFL for tasks assessed/performed   PT - Cognitive impairments: Awareness, Memory, Initiation, Problem solving, Sequencing, Safety/Judgement, History of cognitive impairments      PT - Cognition Comments: relies on spouse for remembering how to manage rollator Following commands: Impaired Following commands impaired: Follows one step commands with increased time    Cueing Cueing Techniques: Verbal cues         Pertinent Vitals/Pain Pain Assessment Pain Assessment: No/denies pain     PT Goals (current  goals can now be found in the care plan  section) Acute Rehab PT Goals PT Goal Formulation: With patient Time For Goal Achievement: 07/20/24 Potential to Achieve Goals: Good Progress towards PT goals: Progressing toward goals    Frequency    Min 2X/week       AM-PAC PT 6 Clicks Mobility   Outcome Measure  Help needed turning from your back to your side while in a flat bed without using bedrails?: A Little Help needed moving from lying on your back to sitting on the side of a flat bed without using bedrails?: A Little Help needed moving to and from a bed to a chair (including a wheelchair)?: A Little Help needed standing up from a chair using your arms (e.g., wheelchair or bedside chair)?: A Little Help needed to walk in hospital room?: A Little Help needed climbing 3-5 steps with a railing? : A Lot 6 Click Score: 17    End of Session   Activity Tolerance: Patient tolerated treatment well Patient left: in bed;with call bell/phone within reach;with bed alarm set;with family/visitor present Nurse Communication: Mobility status PT Visit Diagnosis: Unsteadiness on feet (R26.81);Other abnormalities of gait and mobility (R26.89);Repeated falls (R29.6);Muscle weakness (generalized) (M62.81)     Time: 8559-8496 PT Time Calculation (min) (ACUTE ONLY): 23 min  Charges:    $Gait Training: 8-22 mins $Therapeutic Activity: 8-22 mins PT General Charges $$ ACUTE PT VISIT: 1 Visit                    Kate ORN, PT, DPT Secure Chat Preferred  Rehab Office [phone]  Kate BRAVO Wendolyn 07/09/2024, 3:20 PM

## 2024-07-09 NOTE — Hospital Course (Addendum)
 Jasmine Keith is a 73 y.o. year old female with PMH of CAD/CABG, CKD stage IV, history of SLE, diverticulosis, hypothyroidism hypertension admitted to the hospital due to abdominal pain, diarrhea.CT abdomen/pelvis was obtained and showed diverticulitis. In the ED, she received IV antibiotics with Ceftriaxone  and Flagyl  and was admitted to the medical floor and developed acute onset shortness of breath and tongue swelling, was experiencing respiratory distress and a rapid response was called. PCCM was consulted and patient was intubated for airway protection subsequently extubated on 8/30 and transferred to [facility] 8/31.  Patient has completed antibiotic, tolerating diet no nausea vomiting fever chills.  Hemodynamics remained stable.  Rash is improving.  She completed a steroid and at this time remains stable for discharge home  Subjective: Seen and examined today Overnight afebrile BP 120-150 systolic, on room air Labs reviewed-creatinine about the same at 1.5, hemoglobin stable 9.7 She has been eating well and eager to go home.  Discharge diagnosis:   Acute Hypoxic Respiratory Failure Angioedema likely due to Augmentin  Distributive shock-resolved Patient had angioedema/allergic reaction with respiratory failure angioedema and distributive shock.Intubated for airway protection with subsequent extubation.  Had distributive shock that resolved.  Augmentin  discontinued.  Initially was in ICU with pressors, transferred to TR[facility] 8/31.  Completed steroid 9/1, continue Pepcid , Benadryl  overall symptoms has improved.Respiratory status stable, on room air.   RUE swelling: US  venous duplex which showed evidence of acute right superficial cephalic vein thrombosis but no DVT.   CAD s/p CABG Elevated troponin due to demand ischemia troponin peaked at 1493 Troponin elevation (600 ? 1400) with EKG showing ST changes in V1-V2. Cardiology consulted, likely demand ischemia, echocardiogram reassuring, no further  plan.  Continue home Coreg ,   Acute uncomplicated diverticulitis without perforation H/o diverticulosis and internal hemorrhoids,POA: Intermittent episodes of mild rectal bleeding: Component of hypotension in the setting of diverticulitis along with distributive shock. Augmentin  discontinued due to allergic reaction, at this time no leukocytosis and no fever.   Initially received ceftriaxone  and then Augmentin  subsequently given aztreonam  and Flagyl  completed 8/31 while in ICU No abdominal pain tolerating diet no nausea vomiting fever chills he remains asymptomatic. She does have outpatient GI and had recent colonoscopy, she is in agreement to follow-up with her GI physician and discuss about colonoscopy post diverticulitis.  Extensive erythematous rash: Involving b/l eye lids, anterior chest, both arms and legs.  Continuing on anti-allergics and benadryl  Needs outpatient follow up with dermatology and allergist.  Solu-Medrol  discontinued 9/2 Overall rash has significantly improved as per the husband and aware about the follow-up she needs   Acute tubular necrosis, resolved CKD stage IV Anion gap metabolic acidosis [name] remains stable at 1.5.  Monitor, avoid nephrotoxic medication. monitor renal function   Hx of SLE History of SLE on Plaquenil  on hold   Hypothyroidism  Continue levothyroxine  25   Essential Hypertension: Stable continue with amlodipine , hydralazine  and coreg .  Severe malnutrition with Body mass index is 17.61 kg/m.: Nutrition Problem: Severe Malnutrition Etiology: acute illness Signs/Symptoms: severe muscle depletion, mild fat depletion (She was able to get up to 111 lbs however since being sick she has lost 12 lbs, 10% in the last couple of weeks.) Percent weight loss: 10 % Interventions: Refer to RD note for recommendations   Mobility: PT Orders: Active  PT Follow up Rec: Other (Comment) (Hh Aide)07/08/2024 1634   DVT prophylaxis: SCDs Start: 07/03/24  1704 SCDs Start: 07/02/24 1456 Code Status:   Code Status: Full Code Family Communication: plan of  care discussed with patient at bedside. Patient status is: Remains hospitalized because of severity of illness Level of care: Med-Surg   Dispo: The patient is from: home            Anticipated disposition: home today.  Patient is eager to go home today. Objective: Vitals last 24 hrs: Vitals:   07/08/24 1600 07/08/24 2029 07/09/24 0455 07/09/24 0747  BP: (!) 159/62 (!) 146/64 (!) 120/40 (!) 151/68  Pulse: 74 73 71 80  Resp:    16  Temp:  98.4 F (36.9 C) 98.2 F (36.8 C) 99.3 F (37.4 C)  TempSrc:  Oral Oral Oral  SpO2:  100% 99% 100%  Weight:      Height:        Physical Examination: General exam: alert awake, oriented, older than stated age HEENT:Oral mucosa moist, Ear/Nose WNL grossly Respiratory system: Bilaterally clear BS,no use of accessory muscle Cardiovascular system: S1 & S2 +, No JVD. Gastrointestinal system: Abdomen soft,NT,ND, BS+ Nervous System: Alert, awake, moving all extremities,and following commands. Extremities: LE edema neg, distal extremities warm.  Skin: rashes medial thigh over eyes and back- which is much better, no icterus. MSK: Normal muscle bulk,tone, power   Medications reviewed:  Scheduled Meds:  amLODipine   5 mg Oral Daily   busPIRone   7.5 mg Oral BID   carvedilol   6.25 mg Oral BID WC   dicyclomine   10 mg Oral TID AC & HS   diphenhydrAMINE   25 mg Intravenous Q6H   famotidine   20 mg Oral Daily   feeding supplement  1 Container Oral TID BM   gabapentin   300 mg Oral Daily   hydrALAZINE   100 mg Oral TID   insulin  aspart  0-5 Units Subcutaneous QHS   insulin  aspart  0-9 Units Subcutaneous TID WC   insulin  glargine  15 Units Subcutaneous Daily   levothyroxine   25 mcg Oral Q0600   loratadine   10 mg Oral Daily   multivitamin with minerals  1 tablet Oral Daily   venlafaxine   37.5 mg Oral BID   Continuous Infusions: Diet: Diet Order              Diet general           Diet regular Room service appropriate? Yes with Assist; Fluid consistency: Thin  Diet effective now

## 2024-07-10 ENCOUNTER — Telehealth: Payer: Self-pay

## 2024-07-10 ENCOUNTER — Ambulatory Visit (HOSPITAL_BASED_OUTPATIENT_CLINIC_OR_DEPARTMENT_OTHER)

## 2024-07-10 ENCOUNTER — Telehealth: Payer: Self-pay | Admitting: *Deleted

## 2024-07-10 NOTE — Transitions of Care (Post Inpatient/ED Visit) (Signed)
 07/10/2024  Name: Jasmine Keith MRN: [id number] DOB: 05-Feb-1951  Today's TOC FU Call Status: Today's TOC FU Call Status:: Successful TOC FU Call Completed TOC FU Call Complete Date: 07/10/24 Patient's Name and Date of Birth confirmed.  Transition Care Management Follow-up Telephone Call Date of Discharge: 07/09/24 Discharge Facility: [name] Type of Discharge: Inpatient Admission Primary Inpatient Discharge Diagnosis:: Diverticulitis How have you been since you were released from the hospital?: Better Any questions or concerns?: No  Items Reviewed: Did you receive and understand the discharge instructions provided?: Yes Medications obtained,verified, and reconciled?: Yes (Medications Reviewed) Any new allergies since your discharge?: No Dietary orders reviewed?: NA Do you have support at home?: Yes People in Home [RPT]: spouse Name of Support/Comfort Primary Source: [name] Krock  Medications Reviewed Today: Medications Reviewed Today     Reviewed by Moises Reusing, RN (Case Manager) on 07/10/24 at 1215  Med List Status: <None>   Medication Order Taking? Sig Documenting Provider Last Dose Status Informant  acetaminophen  (TYLENOL ) 325 MG tablet [id number] No Take 2 tablets (650 mg total) by mouth every 6 (six) hours as needed for mild pain (or Fever >/= 101). Swayze, Ava, DO Unknown Active Spouse/Significant Other, Self  alendronate  (FOSAMAX ) 70 MG tablet [id number] No TAKE 1 TABLET BY MOUTH ONCE A WEEK. TAKE WITH A FULL GLASS OF WATER ON AN EMPTY STOMACH. Saguier, Edward, PA-C Past Week Active Self, Spouse/Significant Other           Med Note ROLENE RODRIGUEZ, SUSAN A   Wed Jul 02, 2024  3:31 PM) Pt adamant of being on medication. LF: 09/11/2023 for 85 DS  amLODipine  (NORVASC ) 5 MG tablet [id number]  TAKE 1 TABLET (5 MG TOTAL) BY MOUTH DAILY. Revankar, Jennifer SAUNDERS, MD  Active   busPIRone  (BUSPAR ) 7.5 MG tablet [id number] No TAKE 1 TABLET BY MOUTH 2 TIMES DAILY.  Saguier, Edward, PA-C 07/01/2024 Active Self, Spouse/Significant Other  carvedilol  (COREG ) 6.25 MG tablet [id number] No TAKE 1 TABLET BY MOUTH TWICE A DAY WITH FOOD Saguier, Edward, PA-C 07/01/2024 Active Self, Spouse/Significant Other  cyclobenzaprine  (FLEXERIL ) 5 MG tablet [id number] No Take 1 tablet (5 mg total) by mouth at bedtime.  Patient not taking: Reported on 07/02/2024   Jeannetta Lonni ORN, MD Not Taking Active Self, Spouse/Significant Other           Med Note ROLENE MNIMPHLZS, SUSAN A   Wed Jul 02, 2024  3:30 PM) Hasn't started medication  dicyclomine  (BENTYL ) 10 MG capsule [id number] No TAKE 1 CAPSULE (10 MG TOTAL) BY MOUTH 4 TIMES A DAY BEFORE MEALS AND AT BEDTIME Saguier, Edward, PA-C 07/01/2024 Active Self, Spouse/Significant Other  diphenhydrAMINE  (BENADRYL ) 50 MG tablet [id number]  Take 1 tablet (50 mg total) by mouth every 6 (six) hours as needed for itching or allergies. Christobal Guadalajara, MD  Active   EPINEPHrine  0.3 mg/0.3 mL IJ SOAJ injection [id number]  Inject 0.3 mg into the muscle once as needed (acute allergic reaction). Christobal Guadalajara, MD  Active   fenofibrate  160 MG tablet [id number] No TAKE 1 TABLET BY MOUTH EVERY DAY Saguier, Edward, PA-C 07/01/2024 Active Self, Spouse/Significant Other  fluticasone  (FLONASE ) 50 MCG/ACT nasal spray [id number] No Place 2 sprays into both nostrils daily. Saguier, Edward, PA-C 07/01/2024 Active Self, Spouse/Significant Other  gabapentin  (NEURONTIN ) 300 MG capsule [id number] No Take 1 capsule (300 mg total) by mouth 2 (two) times daily. Saguier, Edward, PA-C 07/01/2024 Active Self, Spouse/Significant Other  hydrALAZINE  (APRESOLINE ) 100 MG tablet [id number] No Take 1 tablet (100  mg total) by mouth 3 (three) times daily. SaguierDallas, PA-C 07/01/2024 Active Self, Spouse/Significant Other  inclisiran (LEQVIO ) 284 MG/1.5ML SOSY injection [id number] No Inject 1.5 mLs (284 mg total) into the skin every 6 (six) months. Revankar, Jennifer SAUNDERS, MD Unknown Active Self,  Spouse/Significant Other  Insulin  Glargine Solostar (LANTUS ) 100 UNIT/ML Solostar Pen [id number] No Inject 30 Units into the skin daily. Saguier, Edward, PA-C 07/01/2024 Active Self, Spouse/Significant Other  Insulin  Pen Needle (BD PEN NEEDLE NANO U/F) 32G X 4 MM MISC [id number]  Use one needle daily to inject insulin  Saguier, Dallas, PA-C  Active Self, Spouse/Significant Other  Insulin  Pen Needle (PEN NEEDLES 3/16) 31G X 5 MM MISC [id number]  Inject 8 Units into the skin daily. Saguier, Dallas, PA-C  Active Self, Spouse/Significant Other  levothyroxine  (SYNTHROID ) 25 MCG tablet [id number] No TAKE 1 TABLET BY MOUTH EVERY DAY BEFORE BREAKFAST Saguier, Edward, PA-C 07/01/2024 Active Self, Spouse/Significant Other  loratadine  (CLARITIN ) 10 MG tablet [id number]  Take 1 tablet (10 mg total) by mouth daily for 7 days. Christobal Guadalajara, MD  Active   Magnesium  250 MG TABS [id number] No Take 4 tablets by mouth daily. [provider] 07/01/2024 Active Self, Spouse/Significant Other  meclizine  (ANTIVERT ) 12.5 MG tablet [id number] No Take 1 tablet (12.5 mg total) by mouth 3 (three) times daily as needed for dizziness. Saguier, Dallas, PA-C Unknown Active Self, Spouse/Significant Other  menthol -cetylpyridinium (CEPACOL) 3 MG lozenge [id number]  Take 1 lozenge (3 mg total) by mouth as needed for sore throat. Christobal Guadalajara, MD  Active   pantoprazole  (PROTONIX ) 40 MG tablet [id number] No TAKE 1 TABLET BY MOUTH EVERY DAY Saguier, Edward, PA-C 07/01/2024 Active Self, Spouse/Significant Other  promethazine -dextromethorphan (PROMETHAZINE -DM) 6.25-15 MG/5ML syrup [id number] No Take 5 mLs by mouth 4 (four) times daily as needed. Antonio Cyndee Jamee SAUNDERS, DO Unknown Active Self, Spouse/Significant Other  valsartan  (DIOVAN ) 80 MG tablet [id number] No Take 1 tablet (80 mg total) by mouth daily. RevankarJennifer SAUNDERS, MD 07/01/2024 Active Self, Spouse/Significant Other  venlafaxine  XR (EFFEXOR -XR) 75 MG 24 hr capsule [id number] No Take 1 capsule (75 mg  total) by mouth daily with breakfast. Dorina Dallas, PA-C 07/01/2024 Active Self, Spouse/Significant Other            Home Care and Equipment/Supplies: Were Home Health Services Ordered?: Yes Name of Home Health Agency:: [facility] Has Agency set up a time to come to your home?: Yes First Home Health Visit Date: 07/10/24 Any new equipment or medical supplies ordered?: Yes Name of Medical supply agency?: [employer] Were you able to get the equipment/medical supplies?: Yes Do you have any questions related to the use of the equipment/supplies?: No  Functional Questionnaire: Do you need assistance with bathing/showering or dressing?: Yes (Currently needs assistance due to illness and hospital stay) Do you need assistance with meal preparation?: Yes (Currently needs assistance due to illness and hospital stay) Do you need assistance with eating?: No Do you have difficulty maintaining continence: No Do you need assistance with getting out of bed/getting out of a chair/moving?: No Do you have difficulty managing or taking your medications?: Yes (Currently needs assistance due to illness and hospital stay)  Follow up appointments reviewed: PCP Follow-up appointment confirmed?: Yes Date of PCP follow-up appointment?: 07/16/24 Follow-up Provider: [facility] Follow-up appointment confirmed?: NA Do you need transportation to your follow-up appointment?: No Do you understand care options if your condition(s) worsen?: Yes-patient verbalized understanding  SDOH Interventions Today    Flowsheet Row Most Recent  Value  SDOH Interventions   Food Insecurity Interventions Intervention Not Indicated  Housing Interventions Intervention Not Indicated  Transportation Interventions Intervention Not Indicated  Utilities Interventions Intervention Not Indicated    Medford Balboa, BSN, RN [place]  [facility] - Population Health RN Care Manager [phone]

## 2024-07-10 NOTE — Telephone Encounter (Addendum)
 Copied from CRM #[id number]. Topic: Clinical - Home Health Verbal Orders >> Jul 10, 2024  3:07 PM Rosina BIRCH wrote: Caller/Agency: michelle from [place] Callback Number: [phone] want to know if the plan of care is ok Service Requested: Skilled Nursing Frequency: one week one, two week one and one week three  Any new concerns about the patient? Yes, patient complaining of left knee pain from a previous fall and having trouble walking on it. Patient has not been xrayed

## 2024-07-10 NOTE — Telephone Encounter (Signed)
 Patient was recently in the hospital. Patient was advised upon discharge to stop taking the PLQ. Patient's husband would like to know if she should in fact stop the PLQ or if she needs to continue the PLQ. Patient was in the hospital for Diverticulitis and then had a severe allergic reaction to Augmentin . Please call patient's husband at [phone].

## 2024-07-10 NOTE — Telephone Encounter (Signed)
 Called michelle and relayed the message

## 2024-07-11 NOTE — Telephone Encounter (Signed)
 I believe this recommendation is to be off the plaquenil  until she finishes recovering from her infection and drug reaction. The steroids given while at the hospital should be sufficient for preventing any lupus activity short term. After her rashes clear up and is tolerating a normal diet again she should be able to resume the medication.

## 2024-07-11 NOTE — Telephone Encounter (Signed)
 LMOM  for patients husband to give the office a call.

## 2024-07-14 NOTE — Telephone Encounter (Signed)
 LMOM for patient to call office

## 2024-07-15 NOTE — Telephone Encounter (Signed)
 Attempted to contact patient , left message on husbands cell to call the office.

## 2024-07-16 ENCOUNTER — Ambulatory Visit: Payer: Self-pay

## 2024-07-16 ENCOUNTER — Encounter: Admitting: Medical

## 2024-07-16 NOTE — Telephone Encounter (Signed)
 With patient's recent hospitalization and complex medical history, and today presenting with severe dizziness/weakness ---it is recommended that the patient goes back to the Emergency Room Home Health Rosaline states that they didn't want to do that and possibly wanted to reschedule her hospital follow up that was supposed to be today because the patient is too weak to come in. This RN called [facility] to advise them of the ER Refusal and Home Health Rosaline was warm transferred over to the [facility] at this time.  FYI Only or Action Required?: Action required by provider: update on patient condition.  Patient was last seen in primary care on 06/26/2024 by Dorina Loving, PA-C.  Called Nurse Triage reporting Dizziness.  Symptoms began after getting home from hospitalization.  Interventions attempted: Rest, hydration, or home remedies.  Symptoms are: gradually worsening.  Triage Disposition: Go to ED Now (or PCP Triage), See HCP Within 4 Hours (Or PCP Triage)  Patient/caregiver understands and will follow disposition?: No, wishes to speak with PCP        Copied from CRM [id number]. Topic: Clinical - Red Word Triage >> Jul 16, 2024 12:20 PM Rea ORN wrote: Red Word that prompted transfer to Nurse Triage: Weakness. Reason for Disposition  [1] Systolic BP 90-110 AND [2] taking blood pressure medications AND [3] feeling weak or lightheaded  SEVERE dizziness (e.g., unable to stand, requires support to walk, feels like passing out now)  Answer Assessment - Initial Assessment Questions Got out of the hospital on the 3rd ---admitted for diverticulitis and Home Health Rosaline advised that the patient had to be intubated while in the hospital due to a reaction to an antibiotic  118/50 in left 98/50 in right  72 heart rate  Lantus  was increased by 10 everytime she was over 100 She last took it yesterday--Lantus  was held today 64 this morning---ate muffin & orange juice 165 after that  Patient  feeling dizzy and weak --patient on Meclizine  for dizziness  Some loose stools--improved since she came home  With patient's recent hospitalization and complex medical history, and today presenting with severe dizziness/weakness ---it is recommended that the patient goes back to the Emergency Room Home Health Rosaline states that they didn't want to do that and possibly wanted to reschedule her hospital follow up that was supposed to be today because the patient is too weak to come in. This RN called [facility] to advise them of the ER Refusal and Home Health Rosaline was warm transferred over to the [facility] at this time.   1. DESCRIPTION: Describe your dizziness.     Weak and dizzy 2. LIGHTHEADED: Do you feel lightheaded? (e.g., somewhat faint, woozy, weak upon standing)     yes 3. VERTIGO: Do you feel like either you or the room is spinning or tilting? (i.e., vertigo)     ----- 4. SEVERITY: How bad is it?  Do you feel like you are going to faint? Can you stand and walk?     severe 5. ONSET:  When did the dizziness begin?     ---- 6. AGGRAVATING FACTORS: Does anything make it worse? (e.g., standing, change in head position)     ----- 7. HEART RATE: Can you tell me your heart rate? How many beats in 15 seconds?  (Note: Not all patients can do this.)       72 per home health michelle 8. CAUSE: What do you think is causing the dizziness? (e.g., decreased fluids or food, diarrhea, emotional distress, heat exposure, new medicine,  sudden standing, vomiting; unknown)     Unsure exactly 9. RECURRENT SYMPTOM: Have you had dizziness before? If Yes, ask: When was the last time? What happened that time?     Yes  has been given meclizine  10. OTHER SYMPTOMS: Do you have any other symptoms? (e.g., fever, chest pain, vomiting, diarrhea, bleeding)       Some loose stools--better since getting home from hospital  Protocols used: Dizziness - Lightheadedness-A-AH, Blood Pressure -  Low-A-AH

## 2024-07-16 NOTE — Telephone Encounter (Signed)
 LMOM for Pt's husband Ryan informing him that PCP received message, does recommend that she go back to the ED for evaluation.

## 2024-07-16 NOTE — Progress Notes (Signed)
 This encounter was created in error - please disregard. Did extensive precharting/[prepare but pt then no showed/cancelled.
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
## 2024-07-16 NOTE — Telephone Encounter (Signed)
 Spoke w/ Rosaline, RN w/ Home Health- she is currently at home w/ Pt and her husband, Ryan. Pt is to weak and not feeling well enough to come today for her hospital f/u at 1:40pm today. I again recommended Ryan to take her back to the ER for dizziness and low bp. Ryan is refusing, wanting to reschedule the hospital f/u to another day this week w/ PCP. Informed I did not see an open slot for hospital f/u (40 min appt) in PCP's schedule for the remainder of the week. Pt's husband asked that I look at Dr. Jennefer schedule as well, however she is full as well. Informed I'd send a message to PCP for recommendations, however I informed Rosaline and Ryan that PCP may recommend she go back to ED as well.

## 2024-07-16 NOTE — Telephone Encounter (Signed)
 Flexeril  should be fine to take any time does not need to wait.

## 2024-07-16 NOTE — Telephone Encounter (Signed)
 I called patient, who verbalized understanding.

## 2024-07-16 NOTE — Telephone Encounter (Signed)
 I called patient's husband, Ryan, who verbalized understanding,  I believe this recommendation is to be off the plaquenil  until she finishes recovering from her infection and drug reaction. The steroids given while at the hospital should be sufficient for preventing any lupus activity short term. After her rashes clear up and is tolerating a normal diet again she should be able to resume the medication.

## 2024-07-17 ENCOUNTER — Telehealth: Payer: Self-pay

## 2024-07-17 NOTE — Telephone Encounter (Signed)
 Copied from CRM [id number]. Topic: Clinical - Home Health Verbal Orders >> Jul 17, 2024  2:14 PM Berneda FALCON wrote: Caller/Agency: Laurier from [facility] Callback Number: [phone] Service Requested: Physical Therapy Frequency: Laurier states that patient has been sick this week and was unable to do the PT Eval this week. They need an order to do the eval next week please. Any new concerns about the patient? No, just that patient was sick and not feeling well.

## 2024-07-18 ENCOUNTER — Ambulatory Visit (HOSPITAL_BASED_OUTPATIENT_CLINIC_OR_DEPARTMENT_OTHER)
Admission: RE | Admit: 2024-07-18 | Discharge: 2024-07-18 | Disposition: A | Discharge status: Home / Self Care | Source: Ambulatory Visit | Attending: Medical | Admitting: Medical

## 2024-07-18 ENCOUNTER — Ambulatory Visit: Admitting: Medical

## 2024-07-18 ENCOUNTER — Telehealth: Payer: Self-pay

## 2024-07-18 ENCOUNTER — Ambulatory Visit: Payer: Self-pay | Admitting: Medical

## 2024-07-18 ENCOUNTER — Encounter: Payer: Self-pay | Admitting: Medical

## 2024-07-18 VITALS — BP 110/50 | HR 63 | Temp 97.7°F | Resp 17 | Ht 63.0 in | Wt 104.6 lb

## 2024-07-18 DIAGNOSIS — T783XXD Angioneurotic edema, subsequent encounter: Secondary | ICD-10-CM

## 2024-07-18 DIAGNOSIS — R5383 Other fatigue: Secondary | ICD-10-CM

## 2024-07-18 DIAGNOSIS — K5792 Diverticulitis of intestine, part unspecified, without perforation or abscess without bleeding: Secondary | ICD-10-CM | POA: Diagnosis not present

## 2024-07-18 DIAGNOSIS — R197 Diarrhea, unspecified: Secondary | ICD-10-CM

## 2024-07-18 DIAGNOSIS — I10 Essential (primary) hypertension: Secondary | ICD-10-CM

## 2024-07-18 DIAGNOSIS — I82611 Acute embolism and thrombosis of superficial veins of right upper extremity: Secondary | ICD-10-CM

## 2024-07-18 DIAGNOSIS — M25562 Pain in left knee: Secondary | ICD-10-CM | POA: Insufficient documentation

## 2024-07-18 DIAGNOSIS — R42 Dizziness and giddiness: Secondary | ICD-10-CM

## 2024-07-18 DIAGNOSIS — E43 Unspecified severe protein-calorie malnutrition: Secondary | ICD-10-CM

## 2024-07-18 DIAGNOSIS — M329 Systemic lupus erythematosus, unspecified: Secondary | ICD-10-CM

## 2024-07-18 DIAGNOSIS — R5381 Other malaise: Secondary | ICD-10-CM

## 2024-07-18 DIAGNOSIS — N189 Chronic kidney disease, unspecified: Secondary | ICD-10-CM | POA: Diagnosis not present

## 2024-07-18 LAB — COMPREHENSIVE METABOLIC PANEL WITH GFR
ALT: 14 U/L (ref 0–35)
AST: 25 U/L (ref 0–37)
Albumin: 3.8 g/dL (ref 3.5–5.2)
Alkaline Phosphatase: 45 U/L (ref 39–117)
BUN: 29 mg/dL — ABNORMAL HIGH (ref 6–23)
CO2: 23 meq/L (ref 19–32)
Calcium: 9.2 mg/dL (ref 8.4–10.5)
Chloride: 106 meq/L (ref 96–112)
Creatinine, Ser: 1.32 mg/dL — ABNORMAL HIGH (ref 0.40–1.20)
GFR: 40.56 mL/min — ABNORMAL LOW (ref >60.00)
Glucose, Bld: 81 mg/dL (ref 70–99)
Potassium: 4 meq/L (ref 3.5–5.1)
Sodium: 139 meq/L (ref 135–145)
Total Bilirubin: 0.5 mg/dL (ref 0.2–1.2)
Total Protein: 6.9 g/dL (ref 6.0–8.3)

## 2024-07-18 LAB — CBC WITH DIFFERENTIAL/PLATELET
Basophils Absolute: 0.1 K/uL (ref 0.0–0.1)
Basophils Relative: 1.1 % (ref 0.0–3.0)
Eosinophils Absolute: 0.1 K/uL (ref 0.0–0.7)
Eosinophils Relative: 1.3 % (ref 0.0–5.0)
HCT: 30.3 % — ABNORMAL LOW (ref 36.0–46.0)
Hemoglobin: 9.7 g/dL — ABNORMAL LOW (ref 12.0–15.0)
Lymphocytes Relative: 7.4 % — ABNORMAL LOW (ref 12.0–46.0)
Lymphs Abs: 0.5 K/uL — ABNORMAL LOW (ref 0.7–4.0)
MCHC: 32.2 g/dL (ref 30.0–36.0)
MCV: 85.6 fl (ref 78.0–100.0)
Monocytes Absolute: 0.2 K/uL (ref 0.1–1.0)
Monocytes Relative: 2.5 % — ABNORMAL LOW (ref 3.0–12.0)
Neutro Abs: 5.7 K/uL (ref 1.4–7.7)
Neutrophils Relative %: 87.7 % — ABNORMAL HIGH (ref 43.0–77.0)
Platelets: 251 K/uL (ref 150.0–400.0)
RBC: 3.54 Mil/uL — ABNORMAL LOW (ref 3.87–5.11)
RDW: 16.7 % — ABNORMAL HIGH (ref 11.5–15.5)
WBC: 6.5 K/uL (ref 4.0–10.5)

## 2024-07-18 LAB — MAGNESIUM: Magnesium: 1.8 mg/dL (ref 1.5–2.5)

## 2024-07-18 MED ORDER — HYDRALAZINE HCL 50 MG PO TABS: 50.0000 mg | ORAL_TABLET | Freq: Three times a day (TID) | ORAL | 0 refills | Status: DC

## 2024-07-18 NOTE — Patient Instructions (Signed)
 Dizziness and lower bp readings Chronic dizziness possible likely due to antihypertensive therapy and polypharmacy? Low blood pressure readings potentially contributing. Differential includes dehydration, anemia, and medication side effects. - Reduce hydralazine  dose to 50 mg three times a day. - Continue amlodipine  and carvedilol . - Monitor blood pressure daily and report readings on Tuesday. - Evaluate for other causes of dizziness if symptoms persist.  Essential hypertension Hypertension managed with hydralazine , amlodipine , and carvedilol . Low blood pressure necessitates hydralazine  adjustment. - Reduce hydralazine  dose to 50 mg three times a day. - Continue amlodipine  and carvedilol . - Monitor blood pressure daily.  Systemic lupus erythematosus Lupus treatment with hydroxychloroquine  and muscle relaxers on hold due to rash and angioedema. Awaiting symptom resolution before resuming treatment. - Hold hydroxychloroquine  and muscle relaxers until symptoms resolve.  Angioedema due to Augmentin  Recent angioedema attributed to Augmentin . EpiPen  available for emergency use. - Refer to allergist for evaluation. - Ensure EpiPen  is available for emergency use.  Diarrhea with remote history of Clostridioides difficile infection Recent diarrhea with history of C. difficile infection. Risk of recurrence due to recent antibiotic use. - Collect stool samples if diarrhea remains watery and submit for C. difficile testing on Monday or Tuesday.  Diverticulitis History of diverticulitis with ongoing gastrointestinal symptoms. Recent colonoscopy performed 1.5 years ago. - Refer to gastroenterologist for further evaluation.  Severe protein-calorie malnutrition Recent hospitalization with severe malnutrition. Weight increased from 99 to 104 pounds. Nutritional status improving. - Continue nutritional supplements such as Carnation Instant Breakfast and Ensure.  Superficial vein thrombosis, right  upper extremity Superficial vein thrombosis in right arm. Requires monitoring for extension to deep vein system. - Order ultrasound of right upper extremity to assess for extension.  Chronic kidney disease Chronic kidney disease with previous decline in renal function during hospitalization. - Monitor renal function with laboratory tests.  Hypermagnesemia Previous high magnesium  levels noted. Current levels to be re-evaluated. - Check magnesium  levels with laboratory tests.  Fatigue and physical deconditioning Fatigue and physical deconditioning noted post-hospitalization. - Refer to physical therapy for rehabilitation.  Follow up date to be determined after lab and imaging review.

## 2024-07-18 NOTE — Progress Notes (Signed)
 Subjective:    Patient ID: Jasmine Keith, female    DOB: 12-24-1950, 73 y.o.   MRN: [id number]  HPI  Pt in for follow up. Pt hospitalized. Below h and p form addmision date 07-02-2024    Abdominal Pain   Diarrhea    HPI: Jasmine Keith is a 73 y.o. female with medical history significant of CAD s/p CABG, T2DM, GERD, stage IIIa CKD, and CDI p/w diverticulitis.   Pt was in [facility] until she started experiencing severe diarrhea for approximately 48 hours, which started on Tuesday morning. The diarrhea was described as black and watery, occurring four to five times a day, and often leading to urgency that resulted in accidents. The patient also experienced severe abdominal pain, particularly in the lower abdomen, which was described as doubling her over in pain. There was associated dizziness, especially when standing or moving the head quickly. The dizziness subsided quickly but was significant enough to require medication. The patient recently completed a course of antibiotics for a sore throat and flu-like symptoms, stopping the medication about a week ago. The patient has a history of C. diff infection but does not believe the current symptoms are related.   In the ED, pt hypertensive and intermittently tachypneic w/o hypoxia. Labs notable for Cr 1.5 (at baseline), glucose 60-->200s. CT abd showed diverticulitis. EDP requested admission for IV abx and hypoglycemia.  CT findings are consistent with acute uncomplicated sigmoid diverticulitis with additional inflammatory changes extending to the upper rectum. No evidence of perforation or abscess    Assessment and Plan: 9F h/o CAD s/p CABG, T2DM, GERD, stage IIIa CKD, and CDI p/w diverticulitis c/b hypoglycemia.   Hypoglcycemia Asyptomatic and likely related to ED IVF bolus; pt started on D10 gtt and rebounded quickly -D/c D10 and monitor glucose with PO intake only; if stable, then safe to d/c home tomorrow   Diverticulitis S/p  IV CTX and flagyl  in the ED -PO Agumentin 875/125 BID for 3 more days to complete 4 day course   HTN -PTA amlodipine  5mg  daily and Coreg  6.25mg  BID   DM2 -Lantus  20U nightly + SSI TID AC prn   SLE -PTA plaquenil    Hypothyroid -PTA levothyroxine      Advance Care Planning:   Code Status: Full Code    Consults: N/A   Family Communication: Husband     Severity of Illness: The appropriate patient status for this patient is OBSERVATION. Observation status is judged to be reasonable and necessary in order to provide the required intensity of service to ensure the patient's safety. The patient's presenting symptoms, physical exam findings, and initial radiographic and laboratory data in the context of their medical condition is felt to place them at decreased risk for further clinical deterioration. Furthermore, it is anticipated that the patient will be medically stable for discharge from the hospital within 2 midnights of admission.    Pt dc summary below  Discharge date: 07/09/2024 Recommendations for Outpatient Follow-up:  Follow up with PCP in 1 weeks-call for appointment Please obtain BMP/CBC in one week Follow-up with dermatology, allergy specialist   Brief/Interim Summary: Jasmine Keith is a 73 y.o. year old female with PMH of CAD/CABG, CKD stage IV, history of SLE, diverticulosis, hypothyroidism hypertension admitted to the hospital due to abdominal pain, diarrhea.CT abdomen/pelvis was obtained and showed diverticulitis. In the ED, she received IV antibiotics with Ceftriaxone  and Flagyl  and was admitted to the medical floor and developed acute onset shortness of breath and tongue swelling,  was experiencing respiratory distress and a rapid response was called. PCCM was consulted and patient was intubated for airway protection subsequently extubated on 8/30 and transferred to [facility] 8/31.  Patient has completed antibiotic, tolerating diet no nausea vomiting fever chills.   Hemodynamics remained stable.  Rash is improving.  She completed a steroid and at this time remains stable for discharge home    Subjective: Seen and examined today Overnight afebrile BP 120-150 systolic, on room air Labs reviewed-creatinine about the same at 1.5, hemoglobin stable 9.7 She has been eating well and eager to go home.   Discharge diagnosis:   Acute Hypoxic Respiratory Failure Angioedema likely due to Augmentin  Distributive shock-resolved Patient had angioedema/allergic reaction with respiratory failure angioedema and distributive shock.Intubated for airway protection with subsequent extubation.  Had distributive shock that resolved.  Augmentin  discontinued.  Initially was in ICU with pressors, transferred to [facility] 8/31.  Completed steroid 9/1, continue Pepcid , Benadryl  overall symptoms has improved.Respiratory status stable, on room air.   RUE swelling: US  venous duplex which showed evidence of acute right superficial cephalic vein thrombosis but no DVT.   CAD s/p CABG Elevated troponin due to demand ischemia troponin peaked at 1493 Troponin elevation (600 ? 1400) with EKG showing ST changes in V1-V2. Cardiology consulted, likely demand ischemia, echocardiogram reassuring, no further plan.  Continue home Coreg ,   Acute uncomplicated diverticulitis without perforation H/o diverticulosis and internal hemorrhoids,POA: Intermittent episodes of mild rectal bleeding: Component of hypotension in the setting of diverticulitis along with distributive shock. Augmentin  discontinued due to allergic reaction, at this time no leukocytosis and no fever.   Initially received ceftriaxone  and then Augmentin  subsequently given aztreonam  and Flagyl  completed 8/31 while in ICU No abdominal pain tolerating diet no nausea vomiting fever chills he remains asymptomatic. She does have outpatient GI and had recent colonoscopy, she is in agreement to follow-up with her GI physician and discuss about  colonoscopy post diverticulitis.   Extensive erythematous rash: Involving b/l eye lids, anterior chest, both arms and legs.  Continuing on anti-allergics and benadryl  Needs outpatient follow up with dermatology and allergist.  Solu-Medrol  discontinued 9/2 Overall rash has significantly improved as per the husband and aware about the follow-up she needs   Acute tubular necrosis, resolved CKD stage IV Anion gap metabolic acidosis [name] remains stable at 1.5.  Monitor, avoid nephrotoxic medication. monitor renal function   Hx of SLE History of SLE on Plaquenil  on hold   Hypothyroidism  Continue levothyroxine  25   Essential Hypertension: Stable continue with amlodipine , hydralazine  and coreg .   Severe malnutrition with Body mass index is 17.61 kg/m.: Nutrition Problem: Severe Malnutrition Etiology: acute illness Signs/Symptoms: severe muscle depletion, mild fat depletion (She was able to get up to 111 lbs however since being sick she has lost 12 lbs, 10% in the last couple of weeks.) Percent weight loss: 10 % Interventions: Refer to RD note for recommendations   Mobility: PT Orders: Active   PT Follow up Rec: Other (Comment) ([facility])07/08/2024 1634    DVT prophylaxis: SCDs Start: 07/03/24 1704 SCDs Start: 07/02/24 1456 Code Status:   Code Status: Full Code Family Communication: plan of care discussed with patient at bedside. Patient status is: Remains hospitalized because of severity of illness Level of care: Med-Surg    Dispo: The patient is from: home            Anticipated disposition: home today.  Patient is eager to go home today.    STOP taking these medications  hydroxychloroquine  200 MG tablet Commonly known as: PLAQUENIL            TAKE these medications     acetaminophen  325 MG tablet Commonly known as: TYLENOL  Take 2 tablets (650 mg total) by mouth every 6 (six) hours as needed for mild pain (or Fever >/= 101).    alendronate  70 MG tablet Commonly  known as: FOSAMAX  TAKE 1 TABLET BY MOUTH ONCE A WEEK. TAKE WITH A FULL GLASS OF WATER ON AN EMPTY STOMACH.    amLODipine  5 MG tablet Commonly known as: NORVASC  TAKE 1 TABLET (5 MG TOTAL) BY MOUTH DAILY.    BD Pen Needle Nano U/F 32G X 4 MM Misc Generic drug: Insulin  Pen Needle Use one needle daily to inject insulin     Pen Needles 3/16 31G X 5 MM Misc Inject 8 Units into the skin daily.    busPIRone  7.5 MG tablet Commonly known as: BUSPAR  TAKE 1 TABLET BY MOUTH 2 TIMES DAILY.    carvedilol  6.25 MG tablet Commonly known as: COREG  TAKE 1 TABLET BY MOUTH TWICE A DAY WITH FOOD    cyclobenzaprine  5 MG tablet Commonly known as: FLEXERIL  Take 1 tablet (5 mg total) by mouth at bedtime.    dicyclomine  10 MG capsule Commonly known as: BENTYL  TAKE 1 CAPSULE (10 MG TOTAL) BY MOUTH 4 TIMES A DAY BEFORE MEALS AND AT BEDTIME    diphenhydrAMINE  50 MG tablet Commonly known as: BENADRYL  Take 1 tablet (50 mg total) by mouth every 6 (six) hours as needed for itching or allergies.    EPINEPHrine  0.3 mg/0.3 mL Soaj injection Commonly known as: EPI-PEN Inject 0.3 mg into the muscle once as needed (acute allergic reaction).    fenofibrate  160 MG tablet TAKE 1 TABLET BY MOUTH EVERY DAY    fluticasone  50 MCG/ACT nasal spray Commonly known as: FLONASE  Place 2 sprays into both nostrils daily.    gabapentin  300 MG capsule Commonly known as: NEURONTIN  Take 1 capsule (300 mg total) by mouth 2 (two) times daily.    hydrALAZINE  100 MG tablet Commonly known as: APRESOLINE  Take 1 tablet (100 mg total) by mouth 3 (three) times daily.    inclisiran 284 MG/1.5ML Sosy injection Commonly known as: LEQVIO  Inject 1.5 mLs (284 mg total) into the skin every 6 (six) months.    Insulin  Glargine Solostar 100 UNIT/ML Solostar Pen Commonly known as: LANTUS  Inject 30 Units into the skin daily.    levothyroxine  25 MCG tablet Commonly known as: SYNTHROID  TAKE 1 TABLET BY MOUTH EVERY DAY BEFORE BREAKFAST     loratadine  10 MG tablet Commonly known as: CLARITIN  Take 1 tablet (10 mg total) by mouth daily for 7 days. Start taking on: July 10, 2024    Magnesium  250 MG Tabs Take 4 tablets by mouth daily.    meclizine  12.5 MG tablet Commonly known as: ANTIVERT  Take 1 tablet (12.5 mg total) by mouth 3 (three) times daily as needed for dizziness.    menthol -cetylpyridinium 3 MG lozenge Commonly known as: CEPACOL Take 1 lozenge (3 mg total) by mouth as needed for sore throat.    pantoprazole  40 MG tablet Commonly known as: PROTONIX  TAKE 1 TABLET BY MOUTH EVERY DAY    promethazine -dextromethorphan 6.25-15 MG/5ML syrup Commonly known as: PROMETHAZINE -DM Take 5 mLs by mouth 4 (four) times daily as needed.    valsartan  80 MG tablet Commonly known as: DIOVAN  Take 1 tablet (80 mg total) by mouth daily.    venlafaxine  XR 75 MG 24 hr capsule  Commonly known as: EFFEXOR -XR Take 1 capsule (75 mg total) by mouth daily with breakfast.                        Durable Medical Equipment  (From admission, onward)                 Start     Ordered    07/08/24 1636   For home use only DME 4 wheeled rolling walker with seat  Once       Question:  Patient needs a walker to treat with the following condition  Answer:  Weakness   07/08/24 1635    07/08/24 1635   For home use only DME 3 n 1  Once        07/08/24 1635                  Follow-up Information       [employer], [place], PA-C. Schedule an appointment as soon as possible for a visit in 1 week(s).   Specialties: Internal Medicine, Family Medicine Contact information: [address] [place] [address] [phone]              [facility] Follow up.   Specialty: Home Health Services Contact information: [address]  [place] [address] [phone]                        Allergies       Allergies  Allergen Reactions   Augmentin  [Amoxicillin -Pot Clavulanate] Anaphylaxis    Lipitor [Atorvastatin ] Other (See Comments)      myalgias   Lovastatin Other (See Comments)      myalgias   Rosuvastatin Other (See Comments)      myalgia        The results of significant diagnostics from this hospitalization (including imaging, microbiology, ancillary and laboratory) are listed below for reference.     Microbiology:        Recent Results (from the past 240 hours)  MRSA Next Gen by PCR, Nasal     Status: None    Collection Time: 07/03/24  5:01 PM    Specimen: Nasal Mucosa; Nasal Swab  Result Value Ref Range Status    MRSA by PCR Next Gen NOT DETECTED NOT DETECTED Final      Comment: (NOTE) The GeneXpert MRSA Assay (FDA approved for NASAL specimens only), is one component of a comprehensive MRSA colonization surveillance program. It is not intended to diagnose MRSA infection nor to guide or monitor treatment for MRSA infections. Test performance is not FDA approved in patients less than 61 years old. Performed at [facility], [address], [place], [place] [address]      Procedures/Studies: Imaging Results  VAS US  UPPER EXTREMITY VENOUS DUPLEX Result Date: 07/08/2024 UPPER VENOUS STUDY  Patient Name:  CACI ORREN  Date of Exam:   07/08/2024 Medical Rec #: [id number]           Accession #:    [id number] Date of Birth: 1951-06-30          Patient Gender: F Patient Age:   76 years Exam Location:  [facility] Procedure:      VAS US  UPPER EXTREMITY VENOUS DUPLEX Referring Phys: DELILIAH RASHID --------------------------------------------------------------------------------  Indications: Pain, Swelling, Erythema, and tenderness. Comparison Study: No prior exam. Performing Technologist: Edilia Elden Appl  Examination Guidelines: A complete evaluation includes  B-mode imaging, spectral Doppler, color Doppler, and power Doppler as needed of all accessible portions of each vessel. Bilateral testing is considered an integral part of a complete examination.  Limited examinations for reoccurring indications may be performed as noted.  Right Findings: +----------+------------+---------+-----------+----------+-------+ RIGHT     CompressiblePhasicitySpontaneousPropertiesSummary +----------+------------+---------+-----------+----------+-------+ IJV           Full       Yes       Yes                      +----------+------------+---------+-----------+----------+-------+ Subclavian    Full       Yes       Yes                      +----------+------------+---------+-----------+----------+-------+ Axillary      Full       Yes       Yes                      +----------+------------+---------+-----------+----------+-------+ Brachial      Full       Yes       Yes                      +----------+------------+---------+-----------+----------+-------+ Radial        Full                                          +----------+------------+---------+-----------+----------+-------+ Ulnar         Full                                          +----------+------------+---------+-----------+----------+-------+ Cephalic      None       No        No                       +----------+------------+---------+-----------+----------+-------+ Basilic       Full       Yes       Yes                      +----------+------------+---------+-----------+----------+-------+ Evidence of superficial vein thrombosis noted in the right cephalic vein from the upper arm to the distal forearm.  Left Findings: +----------+------------+---------+-----------+----------+-------+ LEFT      CompressiblePhasicitySpontaneousPropertiesSummary +----------+------------+---------+-----------+----------+-------+ IJV           Full       Yes       Yes                      +----------+------------+---------+-----------+----------+-------+ Subclavian    Full       Yes       Yes                       +----------+------------+---------+-----------+----------+-------+  Summary:  Right: No evidence of deep vein thrombosis in the upper extremity. Findings consistent with acute superficial vein thrombosis involving the right cephalic vein.  Left: No evidence of thrombosis in the subclavian.  *See table(s) above for measurements and observations.  Diagnosing physician: Debby Robertson Electronically signed by Debby Robertson on 07/08/2024 at 4:46:49 PM.  Final     ECHOCARDIOGRAM COMPLETE Result Date: 07/04/2024    ECHOCARDIOGRAM REPORT   Patient Name:   YAQUELINE GUTTER Date of Exam: 07/04/2024 Medical Rec #:  [id number]          Height:       63.0 in Accession #:    [id number]         Weight:       104.5 lb Date of Birth:  1951-09-06         BSA:          1.467 m Patient Age:    72 years           BP:           132/51 mmHg Patient Gender: F                  HR:           62 bpm. Exam Location:  Inpatient Procedure: 2D Echo, Cardiac Doppler and Color Doppler (Both Spectral and Color            Flow Doppler were utilized during procedure). Indications:    Dyspnea R06.00  History:        Patient has prior history of Echocardiogram examinations, most                 recent 09/22/2022. Prior CABG; Risk Factors:Diabetes and                 Hypertension.  Sonographer:    [name] Referring Phys: [id number] KHABIB DGAYLI IMPRESSIONS  1. Left ventricular ejection fraction, by estimation, is 70 to 75%. Left ventricular ejection fraction by PLAX is 71 %. The left ventricle has hyperdynamic function. The left ventricle has no regional wall motion abnormalities. There is moderate left ventricular hypertrophy. Left ventricular diastolic parameters are consistent with Grade I diastolic dysfunction (impaired relaxation).  2. Right ventricular systolic function is normal. The right ventricular size is normal. Tricuspid regurgitation signal is inadequate for assessing PA pressure.  3. The mitral valve is grossly normal. Trivial  mitral valve regurgitation.  4. The aortic valve is tricuspid. Aortic valve regurgitation is not visualized.  5. The inferior vena cava is normal in size with greater than 50% respiratory variability, suggesting right atrial pressure of 3 mmHg. Comparison(s): Changes from prior study are noted. 09/22/2022: LVEF >75%, RVSP 25.3 mmHg. FINDINGS  Left Ventricle: Left ventricular ejection fraction, by estimation, is 70 to 75%. Left ventricular ejection fraction by PLAX is 71 %. The left ventricle has hyperdynamic function. The left ventricle has no regional wall motion abnormalities. The left ventricular internal cavity size was normal in size. There is moderate left ventricular hypertrophy. Left ventricular diastolic parameters are consistent with Grade I diastolic dysfunction (impaired relaxation). Indeterminate filling pressures. Right Ventricle: The right ventricular size is normal. No increase in right ventricular wall thickness. Right ventricular systolic function is normal. Tricuspid regurgitation signal is inadequate for assessing PA pressure. Left Atrium: Left atrial size was normal in size. Right Atrium: Right atrial size was normal in size. Pericardium: There is no evidence of pericardial effusion. Mitral Valve: The mitral valve is grossly normal. Trivial mitral valve regurgitation. Tricuspid Valve: The tricuspid valve is grossly normal. Tricuspid valve regurgitation is trivial. Aortic Valve: The aortic valve is tricuspid. Aortic valve regurgitation is not visualized. Aortic valve mean gradient measures 3.0 mmHg. Aortic valve peak gradient measures 5.3 mmHg. Aortic valve area, by VTI measures 2.14 cm. Pulmonic Valve: The  pulmonic valve was normal in structure. Pulmonic valve regurgitation is not visualized. Aorta: The aortic root and ascending aorta are structurally normal, with no evidence of dilitation. Venous: The inferior vena cava is normal in size with greater than 50% respiratory variability,  suggesting right atrial pressure of 3 mmHg. IAS/Shunts: No atrial level shunt detected by color flow Doppler.  LEFT VENTRICLE PLAX 2D LV EF:         Left            Diastology                ventricular     LV e' medial:    7.07 cm/s                ejection        LV E/e' medial:  12.3                fraction by     LV e' lateral:   11.40 cm/s                PLAX is 71      LV E/e' lateral: 7.6                %. LVIDd:         3.10 cm LVIDs:         1.90 cm LV PW:         1.30 cm LV IVS:        1.40 cm LVOT diam:     1.70 cm LV SV:         55 LV SV Index:   37 LVOT Area:     2.27 cm  LEFT ATRIUM             Index        RIGHT ATRIUM           Index LA Vol (A2C):   39.2 ml 26.71 ml/m  RA Area:     10.00 cm LA Vol (A4C):   27.7 ml 18.88 ml/m  RA Volume:   16.70 ml  11.38 ml/m LA Biplane Vol: 34.7 ml 23.65 ml/m  AORTIC VALVE AV Area (Vmax):    2.15 cm AV Area (Vmean):   2.12 cm AV Area (VTI):     2.14 cm AV Vmax:           115.00 cm/s AV Vmean:          81.100 cm/s AV VTI:            0.256 m AV Peak Grad:      5.3 mmHg AV Mean Grad:      3.0 mmHg LVOT Vmax:         109.00 cm/s LVOT Vmean:        75.600 cm/s LVOT VTI:          0.241 m LVOT/AV VTI ratio: 0.94  AORTA Ao Root diam: 2.70 cm MITRAL VALVE MV Area (PHT): 2.26 cm     SHUNTS MV Decel Time: 335 msec     Systemic VTI:  0.24 m MV E velocity: 87.00 cm/s   Systemic Diam: 1.70 cm MV A velocity: 109.00 cm/s MV E/A ratio:  0.80 Vinie Maxcy MD Electronically signed by Vinie Maxcy MD Signature Date/Time: 07/04/2024/12:22:44 PM    Final     CT SOFT TISSUE NECK W CONTRAST Result Date: 07/03/2024 CLINICAL DATA:  Initial evaluation for suspected epiglottitis or tonsillitis. EXAM: CT NECK WITH  CONTRAST TECHNIQUE: Multidetector CT imaging of the neck was performed using the standard protocol following the bolus administration of intravenous contrast. RADIATION DOSE REDUCTION: This exam was performed according to the departmental dose-optimization program which  includes automated exposure control, adjustment of the mA and/or kV according to patient size and/or use of iterative reconstruction technique. CONTRAST:  65mL OMNIPAQUE  IOHEXOL  350 MG/ML SOLN COMPARISON:  None Available. FINDINGS: Pharynx and larynx: Patient is intubated with endotracheal and enteric tubes in place, limiting assessment. Additionally, assessment somewhat limited by streak artifact from dental amalgam. The oral tongue appears diffusely swollen, partially deviated to the right by the endotracheal to and protruding through the mouth. No visible collections or mass lesion. Finding raises the possibility for an acute inflammatory process/angioedema. The palatine tonsils themselves are difficult to visualize due to intubation, but appear grossly symmetric and within normal limits. No visible tonsillar or peritonsillar abscess. Fluid signal intensity present within the nasopharynx related to intubation. Nasopharynx otherwise unremarkable. There is suspected mucosal edema about the oropharyngeal mucosa, largely collapsed about the endotracheal tube, suspected to reflect associated supraglottitis, likely due to angio edema. Associated hazy inflammatory stranding within the parapharyngeal and submandibular spaces bilaterally. Glottis grossly symmetric inferiorly. Subglottic airway clear. Endotracheal and enteric tubes appear appropriately position. Salivary glands: Salivary glands including the parotid and submandibular glands are within normal limits. Thyroid : Normal. Lymph nodes: No enlarged or pathologic adenopathy within the neck. Vascular: Atheromatous change about the aortic arch, carotid bifurcations, and skull base. 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 Limited intracranial: Unremarkable. Visualized orbits: Unremarkable. Mastoids and visualized paranasal sinuses: Mild mucosal thickening present about the ethmoidal air cells. Paranasal sinuses are  otherwise clear. Mastoid air cells and middle ear cavities are well pneumatized and free of fluid. Skeleton: No worrisome osseous lesions. Moderate spondylosis at C5-6 and C6-7. Prior sternotomy noted. Upper chest: No other acute finding. Partially visualized lungs are clear. Other: None. IMPRESSION: 1. Diffuse swelling of the oral tongue with suspected edema throughout the oropharyngeal mucosa. Findings raise the possibility for an acute inflammatory process/angioedema. The oropharyngeal airway is collapsed around the endotracheal tube. No visible collections or mass lesion. 2. Endotracheal and enteric tubes in satisfactory position. 3. Occlusion of the left vertebral artery at its origin. 4.  Aortic Atherosclerosis (ICD10-I70.0). Electronically Signed   By: Morene Hoard M.D.   On: 07/03/2024 21:45    DG Chest Port 1 View Result Date: 07/03/2024 CLINICAL DATA:  Respiratory failure.  Orogastric tube placement. EXAM: PORTABLE CHEST 1 VIEW COMPARISON:  June 10, 2024. FINDINGS: Stable cardiomediastinal silhouette. Sternotomy wires are noted.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

## 2024-07-18 NOTE — Telephone Encounter (Signed)
 Spoke to [place] with the agency and notified him that another referral was placed and gave him the verbal order to proceed    Copied from CRM #[id number]. Topic: Clinical - Home Health Verbal Orders >> Jul 18, 2024  4:13 PM Rea BROCKS wrote: Caller/Agency: [facility] Callback Number: [phone] Service Requested: Physical Therapy  Eval  Frequency:  Any new concerns about the patient? PT stated that they need a new verbal order for physical therapy. They also stated that they have not received a phone call from the last verbal order that they made a couple days ago.

## 2024-07-21 ENCOUNTER — Telehealth: Payer: Self-pay

## 2024-07-21 NOTE — Progress Notes (Signed)
 Called pts husband to give lab results but received no answer left voicemail for pt to call back

## 2024-07-21 NOTE — Telephone Encounter (Signed)
 Called to speak with Nat from the [facility] agency but received no answer left voicemail

## 2024-07-21 NOTE — Telephone Encounter (Signed)
 Copied from CRM #[id number]. Topic: Clinical - Medical Advice >> Jul 21, 2024 12:46 PM Pinkey ORN wrote: Reason for CRM: Home Health Report >> Jul 21, 2024 12:50 PM Pinkey ORN wrote: Nat Case Manager  [facility]) [phone] / Office [phone]  States that patients diastolic number is remaining pretty low, in the 40s - 50s. Patient is also experiencing dizziness and wants to know if the blood pressure is related to the dizziness.

## 2024-07-22 NOTE — Telephone Encounter (Signed)
 Called Nat  2nd attempt with no answer left voicemail

## 2024-07-22 NOTE — Telephone Encounter (Signed)
 Also the husband had dropped her hydralazine  to 50, 3 times a day

## 2024-07-22 NOTE — Telephone Encounter (Signed)
 Spoke to nicole from the agency reports that Jasmine Keith systolic bp has been great ranging 118-120 with 140 being the highest I also tried to call walter the pts husband and left a voicemail for him to check the pts bp over the next 3 days and make sure she is hydrated and report the reading back to us 

## 2024-07-22 NOTE — Telephone Encounter (Unsigned)
 Copied from CRM [id number]. Topic: General - Other >> Jul 22, 2024 12:31 PM Jasmin G wrote: Reason for CRM: Ms. Nat from [facility] called to try to reach Ms. Gerard Chuckie SAILOR, CMA regarding recent missed phone call, please call back at [phone].

## 2024-07-24 ENCOUNTER — Encounter: Payer: Self-pay | Admitting: Medical

## 2024-07-24 ENCOUNTER — Encounter: Payer: Self-pay | Admitting: Nephrology

## 2024-07-29 ENCOUNTER — Ambulatory Visit: Payer: Self-pay

## 2024-07-29 NOTE — Telephone Encounter (Signed)
 FYI Only or Action Required?: FYI only for provider.  Patient was last seen in primary care on 07/18/2024 by Dorina Loving, PA-C.  Called Nurse Triage reporting Blood Pressure.  Symptoms began On going.  Interventions attempted: Rest, hydration, or home remedies.  Symptoms are: stable.  Triage Disposition: Information or Advice Only Call  Patient/caregiver understands and will follow disposition?: Yes  Reason for Disposition  Health information question, no triage required and triager able to answer question  Answer Assessment - Initial Assessment Questions Husband on the line, states BP is not new, Nurse was calling to report BP of 118/50. Patients husband denies any new or worsening symptoms, has appointment on 9/25.   1. REASON FOR CALL: What is the main reason for your call? or How can I best help you?     Home Health Nurse calling to report BP 118/50  2. SYMPTOMS : Do you have any symptoms?      Some dizziness  Protocols used: Information Only Call - No Triage-A-AH

## 2024-07-29 NOTE — Telephone Encounter (Signed)
 Copied from CRM #[id number]. Topic: Clinical - Medical Advice >> Jul 29, 2024  3:15 PM Mesmerise C wrote: Reason for CRM: Nat from [facility] advising the provider that patient BP was 118/50 she's been low diastolic BP reading, stated she's been having dizziness but has gotten better

## 2024-07-29 NOTE — Telephone Encounter (Signed)
 Appt scheduled

## 2024-07-30 ENCOUNTER — Telehealth: Payer: Self-pay

## 2024-07-30 NOTE — Telephone Encounter (Signed)
 Faxed off [facility] home health forms and received confirmation

## 2024-07-31 ENCOUNTER — Inpatient Hospital Stay (HOSPITAL_BASED_OUTPATIENT_CLINIC_OR_DEPARTMENT_OTHER): Admitting: Medical Oncology

## 2024-07-31 ENCOUNTER — Inpatient Hospital Stay

## 2024-07-31 ENCOUNTER — Ambulatory Visit (INDEPENDENT_AMBULATORY_CARE_PROVIDER_SITE_OTHER): Admitting: Medical

## 2024-07-31 ENCOUNTER — Inpatient Hospital Stay: Attending: Hematology & Oncology

## 2024-07-31 VITALS — BP 120/58 | HR 64 | Temp 98.1°F | Resp 17 | Ht 63.0 in | Wt 104.8 lb

## 2024-07-31 VITALS — BP 124/45 | HR 64 | Temp 98.3°F | Resp 20 | Ht 63.0 in | Wt 104.8 lb

## 2024-07-31 DIAGNOSIS — E538 Deficiency of other specified B group vitamins: Secondary | ICD-10-CM

## 2024-07-31 DIAGNOSIS — Z23 Encounter for immunization: Secondary | ICD-10-CM | POA: Diagnosis not present

## 2024-07-31 DIAGNOSIS — K5792 Diverticulitis of intestine, part unspecified, without perforation or abscess without bleeding: Secondary | ICD-10-CM | POA: Diagnosis not present

## 2024-07-31 DIAGNOSIS — N189 Chronic kidney disease, unspecified: Secondary | ICD-10-CM | POA: Diagnosis not present

## 2024-07-31 DIAGNOSIS — N179 Acute kidney failure, unspecified: Secondary | ICD-10-CM

## 2024-07-31 DIAGNOSIS — D500 Iron deficiency anemia secondary to blood loss (chronic): Secondary | ICD-10-CM | POA: Diagnosis not present

## 2024-07-31 DIAGNOSIS — R5383 Other fatigue: Secondary | ICD-10-CM

## 2024-07-31 DIAGNOSIS — D631 Anemia in chronic kidney disease: Secondary | ICD-10-CM

## 2024-07-31 DIAGNOSIS — D649 Anemia, unspecified: Secondary | ICD-10-CM | POA: Diagnosis not present

## 2024-07-31 DIAGNOSIS — E1122 Type 2 diabetes mellitus with diabetic chronic kidney disease: Secondary | ICD-10-CM

## 2024-07-31 DIAGNOSIS — T783XXD Angioneurotic edema, subsequent encounter: Secondary | ICD-10-CM | POA: Diagnosis not present

## 2024-07-31 LAB — CBC WITH DIFFERENTIAL (CANCER CENTER ONLY)
Abs Immature Granulocytes: 0.13 K/uL — ABNORMAL HIGH (ref 0.00–0.07)
Basophils Absolute: 0.1 K/uL (ref 0.0–0.1)
Basophils Relative: 1 %
Eosinophils Absolute: 0.1 K/uL (ref 0.0–0.5)
Eosinophils Relative: 3 %
HCT: 27.3 % — ABNORMAL LOW (ref 36.0–46.0)
Hemoglobin: 8.7 g/dL — ABNORMAL LOW (ref 12.0–15.0)
Immature Granulocytes: 3 %
Lymphocytes Relative: 11 %
Lymphs Abs: 0.5 K/uL — ABNORMAL LOW (ref 0.7–4.0)
MCH: 27.9 pg (ref 26.0–34.0)
MCHC: 31.9 g/dL (ref 30.0–36.0)
MCV: 87.5 fL (ref 80.0–100.0)
Monocytes Absolute: 0.1 K/uL (ref 0.1–1.0)
Monocytes Relative: 3 %
Neutro Abs: 3.1 K/uL (ref 1.7–7.7)
Neutrophils Relative %: 79 %
Platelet Count: 303 K/uL (ref 150–400)
RBC: 3.12 MIL/uL — ABNORMAL LOW (ref 3.87–5.11)
RDW: 15.8 % — ABNORMAL HIGH (ref 11.5–15.5)
WBC Count: 4 K/uL (ref 4.0–10.5)
nRBC: 0 % (ref 0.0–0.2)

## 2024-07-31 LAB — CMP (CANCER CENTER ONLY)
ALT: 6 U/L (ref 0–44)
AST: 24 U/L (ref 15–41)
Albumin: 3.9 g/dL (ref 3.5–5.0)
Alkaline Phosphatase: 56 U/L (ref 38–126)
Anion gap: 13 (ref 5–15)
BUN: 17 mg/dL (ref 8–23)
CO2: 21 mmol/L — ABNORMAL LOW (ref 22–32)
Calcium: 9 mg/dL (ref 8.9–10.3)
Chloride: 107 mmol/L (ref 98–111)
Creatinine: 1.37 mg/dL — ABNORMAL HIGH (ref 0.44–1.00)
GFR, Estimated: 41 mL/min — ABNORMAL LOW (ref >60)
Glucose, Bld: 107 mg/dL — ABNORMAL HIGH (ref 70–99)
Potassium: 4.1 mmol/L (ref 3.5–5.1)
Sodium: 141 mmol/L (ref 135–145)
Total Bilirubin: 0.3 mg/dL (ref 0.0–1.2)
Total Protein: 6.8 g/dL (ref 6.5–8.1)

## 2024-07-31 LAB — VITAMIN B12: Vitamin B-12: 452 pg/mL (ref 180–914)

## 2024-07-31 LAB — FERRITIN: Ferritin: 325 ng/mL — ABNORMAL HIGH (ref 11–307)

## 2024-07-31 LAB — IRON AND IRON BINDING CAPACITY (CC-WL,HP ONLY)
Iron: 67 ug/dL (ref 28–170)
Saturation Ratios: 18 % (ref 10.4–31.8)
TIBC: 379 ug/dL (ref 250–450)
UIBC: 312 ug/dL

## 2024-07-31 LAB — RETIC PANEL
Immature Retic Fract: 16.2 % — ABNORMAL HIGH (ref 2.3–15.9)
RBC.: 3.14 MIL/uL — ABNORMAL LOW (ref 3.87–5.11)
Retic Count, Absolute: 94.5 K/uL (ref 19.0–186.0)
Retic Ct Pct: 3 % (ref 0.4–3.1)
Reticulocyte Hemoglobin: 30.2 pg (ref >27.9)

## 2024-07-31 MED ORDER — DARBEPOETIN ALFA 300 MCG/0.6ML IJ SOSY
300.0000 ug | PREFILLED_SYRINGE | Freq: Once | INTRAMUSCULAR | Status: AC
  Administered 2024-07-31: 300 ug via SUBCUTANEOUS
  Filled 2024-07-31: qty 0.6

## 2024-07-31 NOTE — Patient Instructions (Signed)

## 2024-07-31 NOTE — Progress Notes (Signed)
 Subjective:    Patient ID: Jasmine Keith, female    DOB: Mar 24, 1951, 73 y.o.   MRN: [id number]  HPI    Last visit A/P    Dizziness and lower bp readings Chronic dizziness possible likely due to antihypertensive therapy and polypharmacy? Low blood pressure readings potentially contributing. Differential includes dehydration, anemia, and medication side effects. - Reduce hydralazine  dose to 50 mg three times a day. - Continue amlodipine  and carvedilol . - Monitor blood pressure daily and report readings on Tuesday. - Evaluate for other causes of dizziness if symptoms persist.   Essential hypertension Hypertension managed with hydralazine , amlodipine , and carvedilol . Low blood pressure necessitates hydralazine  adjustment. - Reduce hydralazine  dose to 50 mg three times a day. - Continue amlodipine  and carvedilol . - Monitor blood pressure daily.   Systemic lupus erythematosus Lupus treatment with hydroxychloroquine  and muscle relaxers on hold due to rash and angioedema. Awaiting symptom resolution before resuming treatment. - Hold hydroxychloroquine  and muscle relaxers until symptoms resolve.   Angioedema due to Augmentin  Recent angioedema attributed to Augmentin . EpiPen  available for emergency use. - Refer to allergist for evaluation. - Ensure EpiPen  is available for emergency use.   Diarrhea with remote history of Clostridioides difficile infection Recent diarrhea with history of C. difficile infection. Risk of recurrence due to recent antibiotic use. - Collect stool samples if diarrhea remains watery and submit for C. difficile testing on Monday or Tuesday.   Diverticulitis History of diverticulitis with ongoing gastrointestinal symptoms. Recent colonoscopy performed 1.5 years ago. - Refer to gastroenterologist for further evaluation.   Severe protein-calorie malnutrition Recent hospitalization with severe malnutrition. Weight increased from 99 to 104 pounds.  Nutritional status improving. - Continue nutritional supplements such as Carnation Instant Breakfast and Ensure.   Superficial vein thrombosis, right upper extremity Superficial vein thrombosis in right arm. Requires monitoring for extension to deep vein system. - Order ultrasound of right upper extremity to assess for extension.   Chronic kidney disease Chronic kidney disease with previous decline in renal function during hospitalization. - Monitor renal function with laboratory tests.   Hypermagnesemia Previous high magnesium  levels noted. Current levels to be re-evaluated. - Check magnesium  levels with laboratory tests.   Fatigue and physical deconditioning Fatigue and physical deconditioning noted post-hospitalization. - Refer to physical therapy for rehabilitation.   Follow up date to be determined after lab and imaging review.      Jasmine Keith is a 73 year old female with hypertension and anemia who presents with dizziness and blood pressure variability.  She experiences daily dizziness, which has been less intense over the past week but remains a concern. The dizziness was previously more severe when her blood pressure was lower, such as 110/50 mmHg. No slurred speech, acute vision changes, vomiting, headache, or weakness in hands and feet.  Her blood pressure readings have been variable, with some readings as low as 104/72 mmHg and others in the 140s/60s range. Blood pressure tends to be lower in the mornings and higher in the afternoons. She is currently taking amlodipine , carvedilol , and hydralazine , with the hydralazine  dose recently reduced to 50 mg three times a day. Her caregiver notes significant variability.  She has a history of anemia, with a hemoglobin level of 9.7 g/dL noted two weeks ago. She has experienced black stools in the past, which were concerning for potential bleeding, but this has since resolved. She is not currently on iron supplements.  She  experiences abdominal pain, described  lower abdominal pain that occurs  five out of seven days a week. The pain remains a persistent issue but level of pain varies and minimal today. She also reports a history of diarrhea, which has recently improved, allowing her to discontinue the use of diapers. She was treated for diverticulitis in hospital and is awaiting appointment with GI MD.  She has not started taking hydroxychloroquine  for her lupus. She reports feeling fatigued but believes she is back to her baseline level of energy prior to her hospitalization. But pt reluctant to start med with residual abdomen pain.  She has a history of angioedema and is awaiting allergy testing to confirm the cause, with penicillin being a suspected allergen.    Review of Systems See hpi    Objective:   Physical Exam  General Mental Status- Alert. General Appearance- Not in acute distress.   Skin General: Color- Normal Color. Moisture- Normal Moisture.  Neck No JVD.  Chest and Lung Exam Auscultation: Breath Sounds:-CTA  Cardiovascular Auscultation:Rythm- RRR Murmurs & Other Heart Sounds:Auscultation of the heart reveals- No Murmurs.  Abdomen Inspection:-Inspeection Normal. Palpation/Percussion:Note:No mass. Palpation and Percussion of the abdomen reveal- very faint llq Tender, Non Distended + BS, no rebound or guarding.    Neurologic Cranial Nerve exam:- CN III-XII intact(No nystagmus), symmetric smile. Strength:- 5/5 equal and symmetric strength both upper and lower extremities.       Assessment & Plan:   Patient Instructions  Hypertension with blood pressure variability and and at time dizziness but overall less dizzy(bp today well controlled). Blood pressure variability with dizziness likely due to multifactorial causes including anemia. No central neurological cause suspected presently. - Continue amlodipine , carvedilol , hydralazine  regimen. - Monitor blood pressure, notify  if   stysoic greater than >160 mmHg. - Ensure proper blood pressure measurement technique.  Type 2 diabetes mellitus with variable glycemic control Variable blood glucose levels with potential dizziness from fluctuations. - Order A1c. - Monitor blood glucose, especially if dizzy. -on lantus  40 units daily.  Anemia with hx of diverticultis recently. Anemia may contribute to dizziness. Hemoglobin at 9.7. - Follow up with gastroenterologist. Number below please call. - Order CBC, iron panel, metabolic panel.  Chronic kidney disease -follow up with nephrologist.  Hx of diverticulitis. - Contact gastroenterologist for colonoscopy. - Consider Cipro  and Flagyl  if pain worsens, adjust for kidney function. -Let me know if your pain worsens as before.  Suspected systemic lupus erythematosus (SLE) Suspected SLE. Pt being seen by rheumatologist Hydroxychloroquine  previously held. - Contact rheumatologist to discuss starting hydroxychloroquine  after you see GI MD.  Deconditioning Deconditioning noted. - Confirm preferred physical therapy location in [place] for referral.  Angioedema to penicillin(but want to review potential other causes) Angioedema with penicillin. Allergy testing recommended. - Contact allergist for allergy testing and evaluation.  Follow up date to be determined after lab review        Gastroenterologist referral/appointment. A colonoscopy or work up might indicate the source of anemia/contributing factor. [phone]    Ainsley Sanguinetti, PA-C   I personally spent a total of 60 minutes in the care of the patient today including performing a medically appropriate exam/evaluation, counseling and educating, placing orders, and documenting clinical information in the EHR.
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
## 2024-07-31 NOTE — Patient Instructions (Addendum)
 Hypertension with blood pressure variability and and at time dizziness but overall less dizzy(bp today well controlled). Blood pressure variability with dizziness likely due to multifactorial causes including anemia. No central neurological cause suspected presently. - Continue amlodipine , carvedilol , hydralazine  regimen. - Monitor blood pressure, notify  if  stysoic greater than >160 mmHg. - Ensure proper blood pressure measurement technique.  Type 2 diabetes mellitus with variable glycemic control Variable blood glucose levels with potential dizziness from fluctuations. - Order A1c. - Monitor blood glucose, especially if dizzy. -on lantus  40 units daily.  Anemia with hx of diverticultis recently. Anemia may contribute to dizziness. Hemoglobin at 9.7. - Follow up with gastroenterologist. Number below please call. - Order CBC, iron panel, metabolic panel.  Chronic kidney disease -follow up with nephrologist.  Hx of diverticulitis. - Contact gastroenterologist for colonoscopy. - Consider Cipro  and Flagyl  if pain worsens, adjust for kidney function. -Let me know if your pain worsens as before.  Suspected systemic lupus erythematosus (SLE) Suspected SLE. Pt being seen by rheumatologist Hydroxychloroquine  previously held. - Contact rheumatologist to discuss starting hydroxychloroquine  after you see GI MD.  Deconditioning Deconditioning noted. - Confirm preferred physical therapy location in  for referral.  Angioedema to penicillin(but want to review potential other causes) Angioedema with penicillin. Allergy testing recommended. - Contact allergist for allergy testing and evaluation.  Follow up date to be determined after lab review        Gastroenterologist referral/appointment. A colonoscopy or work up might indicate the source of anemia/contributing factor. [phone]

## 2024-08-03 ENCOUNTER — Other Ambulatory Visit: Payer: Self-pay | Admitting: Medical

## 2024-08-03 ENCOUNTER — Other Ambulatory Visit: Payer: Self-pay | Admitting: Cardiology

## 2024-08-04 ENCOUNTER — Other Ambulatory Visit: Payer: Self-pay | Admitting: Medical

## 2024-08-04 ENCOUNTER — Encounter: Payer: Self-pay | Admitting: Gastroenterology

## 2024-08-05 ENCOUNTER — Ambulatory Visit: Payer: Self-pay | Admitting: Medical Oncology

## 2024-08-06 ENCOUNTER — Telehealth: Payer: Self-pay

## 2024-08-06 NOTE — Telephone Encounter (Signed)
 John,   I see this patient was cleared for care. I just want to confirm with you that she is ok to proceed here at [facility] it is popping up that she is flagged for difficult intubation in 2025 which I do not see a procedure for. I see she had a CABG in 2023 with no issues at that time. Pt PV is tomorrow, Please advise.   Thank you, PV

## 2024-08-06 NOTE — Telephone Encounter (Signed)
 [facility] home health forms faxed with confirmation recieved

## 2024-08-07 ENCOUNTER — Ambulatory Visit (AMBULATORY_SURGERY_CENTER)

## 2024-08-07 ENCOUNTER — Other Ambulatory Visit: Payer: Self-pay

## 2024-08-07 ENCOUNTER — Ambulatory Visit: Payer: Self-pay

## 2024-08-07 VITALS — Ht 61.0 in | Wt 104.0 lb

## 2024-08-07 DIAGNOSIS — Z860100 Personal history of colon polyps, unspecified: Secondary | ICD-10-CM

## 2024-08-07 MED ORDER — NA SULFATE-K SULFATE-MG SULF 17.5-3.13-1.6 GM/177ML PO SOLN: 1.0000 | Freq: Once | ORAL | 0 refills | Status: AC

## 2024-08-07 NOTE — Telephone Encounter (Signed)
 Noted on PV chart for PV today

## 2024-08-07 NOTE — Progress Notes (Signed)
 Denies allergies to eggs or soy products. Denies complication of anesthesia or sedation. Denies use of weight loss medication. Denies use of O2.   Emmi instructions given for colonoscopy.   During PV I discussed patients diabetes. Patient is on Lantus  once a day and she doses in the morning. Patient was told that she could take her regular Lantus  dose in the morning the day before. Patient dose not dose in the evening. Jasmine Keith and her husband states that she is very brittle and sometimes her glucose drops to 30 and then it will go real high. I instructed the patient to have a few ounces of apple juice about 10:50 10 minutes before she has to be NPO. I also encouraged the patient to call the physician that oversees her diabetes and make sure that our instructions are satisfactory for this patient. The patient verbalizes understanding of Lantus  instructions.

## 2024-08-07 NOTE — Telephone Encounter (Signed)
  FYI Only or Action Required?: FYI only for provider.  Patient was last seen in primary care on 07/31/2024 by Dorina Loving, PA-C.  Called Nurse Triage reporting General Question.  Symptoms began n/a.  Interventions attempted: Other: n/a.  Symptoms are: n/a.  Triage Disposition: Information or Advice Only Call  Patient/caregiver understands and will follow disposition?: Yes Reason for Disposition  General information question, no triage required and triager able to answer question  Answer Assessment - Initial Assessment Questions Patient's husband was confused why patient was flagged for difficult intubation and was wondering why the RN was needing to clear her for her appointment today, relayed notes from RN and CRNA to patients husband. He also asked about giving patient meds prescribed from rheumatology, advised to call specialist office regarding medications prescribed from Dr. Jeannetta.  1. REASON FOR CALL: What is the main reason for your call? or How can I best help you?     Questions about call from yesterday regarding intubation clearing  2. SYMPTOMS : Do you have any symptoms?      No  Protocols used: Information Only Call - No Triage-A-AH  Copied from CRM [id number]. Topic: General - Call Back - No Documentation >> Aug 07, 2024 11:15 AM Alfonso ORN wrote: Reason for CRM: pt called back for clarification on call from yesterday and updates. Relayed notes from RN pt has more questions. Please call back regarding updates .

## 2024-08-11 ENCOUNTER — Other Ambulatory Visit: Payer: Self-pay | Admitting: Medical

## 2024-08-11 MED ORDER — HYDRALAZINE HCL 50 MG PO TABS: 50.0000 mg | ORAL_TABLET | Freq: Three times a day (TID) | ORAL | 0 refills | Status: DC

## 2024-08-15 ENCOUNTER — Encounter: Payer: Self-pay | Admitting: Internal Medicine

## 2024-08-15 ENCOUNTER — Ambulatory Visit: Admitting: Internal Medicine

## 2024-08-15 VITALS — BP 130/82 | HR 63 | Resp 16 | Ht 63.0 in | Wt 106.2 lb

## 2024-08-15 DIAGNOSIS — M328 Other forms of systemic lupus erythematosus: Secondary | ICD-10-CM

## 2024-08-15 DIAGNOSIS — T782XXD Anaphylactic shock, unspecified, subsequent encounter: Secondary | ICD-10-CM

## 2024-08-15 DIAGNOSIS — T782XXA Anaphylactic shock, unspecified, initial encounter: Secondary | ICD-10-CM
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
## 2024-08-15 NOTE — Patient Instructions (Signed)
 Anaphylactic reaction to Augmentin  (amoxicillin -clavulanate) and penicillin allergy evaluation Severe anaphylactic reaction post-Augmentin  implicates it as the cause. Timeline supports Augmentin . Accurate allergen identification crucial due to lupus and potential immunosuppression and need for future antibiotics . Explained penicillin allergy testing protocol and risks. Emphasized importance of identifying true allergen to prevent severe reactions and ensure safe antibiotic use. Skin testing 97% accurate; drug challenge confirms allergy. - Schedule penicillin allergy testing: skin prick and intradermal. - If skin testing negative, proceed with drug challenge. - Counseled on the availability of rescue medications, including epinephrine , during testing.  Follow up: for penicillin skin testing.  Anticipate staying here for up to 2 hours.  Avoid all antihistamines for 3 days prior

## 2024-08-21 ENCOUNTER — Ambulatory Visit

## 2024-08-21 ENCOUNTER — Inpatient Hospital Stay

## 2024-08-21 ENCOUNTER — Ambulatory Visit (AMBULATORY_SURGERY_CENTER): Admitting: Gastroenterology

## 2024-08-21 ENCOUNTER — Encounter: Payer: Self-pay | Admitting: Gastroenterology

## 2024-08-21 ENCOUNTER — Ambulatory Visit: Admitting: Medical Oncology

## 2024-08-21 VITALS — BP 146/43 | HR 54 | Temp 97.3°F | Resp 14 | Ht 61.0 in | Wt 104.0 lb

## 2024-08-21 DIAGNOSIS — K5730 Diverticulosis of large intestine without perforation or abscess without bleeding: Secondary | ICD-10-CM | POA: Diagnosis not present

## 2024-08-21 DIAGNOSIS — Z1211 Encounter for screening for malignant neoplasm of colon: Secondary | ICD-10-CM | POA: Diagnosis not present

## 2024-08-21 DIAGNOSIS — D120 Benign neoplasm of cecum: Secondary | ICD-10-CM | POA: Diagnosis not present

## 2024-08-21 DIAGNOSIS — K640 First degree hemorrhoids: Secondary | ICD-10-CM | POA: Diagnosis not present

## 2024-08-21 DIAGNOSIS — Z860100 Personal history of colon polyps, unspecified: Secondary | ICD-10-CM

## 2024-08-21 DIAGNOSIS — K635 Polyp of colon: Secondary | ICD-10-CM | POA: Diagnosis not present

## 2024-08-21 MED ORDER — SODIUM CHLORIDE 0.9 % IV SOLN: 500.0000 mL | Freq: Once | INTRAVENOUS | Status: AC

## 2024-08-21 NOTE — Progress Notes (Signed)
 Called to room to assist during endoscopic procedure.  Patient ID and intended procedure confirmed with present staff. Received instructions for my participation in the procedure from the performing physician.
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
## 2024-08-21 NOTE — Patient Instructions (Signed)
 Resume all of your previous medications today as ordered.  Eat when you get home.  Be sure to take your blood sugar before you take any of your diabetes medication. Read your discharge instructions.    YOU HAD AN ENDOSCOPIC PROCEDURE TODAY AT THE [facility]:   Refer to the procedure report that was given to you for any specific questions about what was found during the examination.  If the procedure report does not answer your questions, please call your gastroenterologist to clarify.  If you requested that your care partner not be given the details of your procedure findings, then the procedure report has been included in a sealed envelope for you to review at your convenience later.  YOU SHOULD EXPECT: Some feelings of bloating in the abdomen. Passage of more gas than usual.  Walking can help get rid of the air that was put into your GI tract during the procedure and reduce the bloating. If you had a lower endoscopy (such as a colonoscopy or flexible sigmoidoscopy) you may notice spotting of blood in your stool or on the toilet paper. If you underwent a bowel prep for your procedure, you may not have a normal bowel movement for a few days.  Please Note:  You might notice some irritation and congestion in your nose or some drainage.  This is from the oxygen used during your procedure.  There is no need for concern and it should clear up in a day or so.  SYMPTOMS TO REPORT IMMEDIATELY:  Following lower endoscopy (colonoscopy or flexible sigmoidoscopy):  Excessive amounts of blood in the stool  Significant tenderness or worsening of abdominal pains  Swelling of the abdomen that is new, acute  Fever of 100F or higher   For urgent or emergent issues, a gastroenterologist can be reached at any hour by calling [phone]. Do not use MyChart messaging for urgent concerns.    DIET:  We do recommend a small meal at first, but then you may proceed to your regular diet.  Drink plenty  of fluids but you should avoid alcoholic beverages for 24 hours.  ACTIVITY:  You should plan to take it easy for the rest of today and you should NOT DRIVE or use heavy machinery until tomorrow (because of the sedation medicines used during the test).    FOLLOW UP: Our staff will call the number listed on your records the next business day following your procedure.  We will call around 7:15- 8:00 am to check on you and address any questions or concerns that you may have regarding the information given to you following your procedure. If we do not reach you, we will leave a message.     If any biopsies were taken you will be contacted by phone or by letter within the next 1-3 weeks.  Please call us  at [phone] if you have not heard about the biopsies in 3 weeks.    SIGNATURES/CONFIDENTIALITY: You and/or your care partner have signed paperwork which will be entered into your electronic medical record.  These signatures attest to the fact that that the information above on your After Visit Summary has been reviewed and is understood.  Full responsibility of the confidentiality of this discharge information lies with you and/or your care-partner.

## 2024-08-21 NOTE — Progress Notes (Signed)
 Report given to PACU, vss

## 2024-08-21 NOTE — Op Note (Signed)
 [facility] Patient Name: Jasmine Keith Procedure Date: 08/21/2024 2:56 PM MRN: [id number] Endoscopist: Lynnie Bring , MD, [id number] Age: 73 Referring MD:  Date of Birth: Jun 27, 1951 Gender: Female Account #: [id number] Procedure:                Colonoscopy Indications:              High risk colon cancer surveillance: Personal                            history of colonic polyps. H/O recent                            diverticulitis (resolved) Medicines:                Monitored Anesthesia Care Procedure:                Pre-Anesthesia Assessment:                           - Prior to the procedure, a History and Physical                            was performed, and patient medications and                            allergies were reviewed. The patient's tolerance of                            previous anesthesia was also reviewed. The risks                            and benefits of the procedure and the sedation                            options and risks were discussed with the patient.                            All questions were answered, and informed consent                            was obtained. Prior Anticoagulants: The patient has                            taken no anticoagulant or antiplatelet agents. ASA                            Grade Assessment: II - A patient with mild systemic                            disease. After reviewing the risks and benefits,                            the patient was deemed in satisfactory condition to  undergo the procedure.                           After obtaining informed consent, the colonoscope                            was passed under direct vision. Throughout the                            procedure, the patient's blood pressure, pulse, and                            oxygen saturations were monitored continuously. The                            Olympus Scope SN [id number] was introduced  through the                            anus and advanced to the 2 cm into the ileum. The                            colonoscopy was performed without difficulty. The                            patient tolerated the procedure well. The quality                            of the bowel preparation was good. The terminal                            ileum, ileocecal valve, appendiceal orifice, and                            rectum were photographed. Scope In: 3:10:47 PM Scope Out: 3:21:31 PM Scope Withdrawal Time: 0 hours 8 minutes 3 seconds  Total Procedure Duration: 0 hours 10 minutes 44 seconds  Findings:                 A 2 mm polyp was found in the cecum. The polyp was                            sessile. The polyp was removed with a cold biopsy                            forceps. Resection and retrieval were complete.                           Multiple medium-mouthed diverticula were found in                            the sigmoid colon, few in transverse colon and                            ascending colon.  Non-bleeding internal hemorrhoids were found during                            retroflexion. The hemorrhoids were small and Grade                            I (internal hemorrhoids that do not prolapse).                           The terminal ileum appeared normal.                           Retroflexion in the right colon was performed.                           The exam was otherwise without abnormality on                            direct and retroflexion views. Complications:            No immediate complications. Estimated Blood Loss:     Estimated blood loss: none. Impression:               - One 2 mm polyp in the cecum, removed with a cold                            biopsy forceps. Resected and retrieved.                           - Moderate predominantly sigmoid diverticulosis.                           - Non-bleeding internal hemorrhoids.                            - The examined portion of the ileum was normal.                           - The examination was otherwise normal on direct                            and retroflexion views. Recommendation:           - Patient has a contact number available for                            emergencies. The signs and symptoms of potential                            delayed complications were discussed with the                            patient. Return to normal activities tomorrow.                            Written discharge instructions were provided to  the                            patient.                           - Resume previous diet.                           - Continue present medications.                           - Await pathology results.                           - Repeat colonoscopy for surveillance based on                            pathology results. Likely not needed d/t age.                           - The findings and recommendations were discussed                            with the patient's family. Lynnie Bring, MD 08/21/2024 3:25:29 PM This report has been signed electronically.

## 2024-08-22 ENCOUNTER — Telehealth: Payer: Self-pay | Admitting: *Deleted

## 2024-08-22 NOTE — Telephone Encounter (Signed)
  Follow up Call-     08/21/2024    1:54 PM  Call back number  Post procedure Call Back phone  # [phone]  Permission to leave phone message Yes     Patient questions:   Message left to call if necessary.

## 2024-08-26 ENCOUNTER — Telehealth: Payer: Self-pay

## 2024-08-26 LAB — SURGICAL PATHOLOGY

## 2024-08-26 NOTE — Telephone Encounter (Signed)
 Received fax that [facility] has been trying to contact pt and schedule with her but she can't be reached. Referral placed on 06/26/24 just fyi

## 2024-08-28 ENCOUNTER — Inpatient Hospital Stay

## 2024-08-28 ENCOUNTER — Inpatient Hospital Stay: Attending: Hematology & Oncology

## 2024-08-28 ENCOUNTER — Other Ambulatory Visit: Payer: Self-pay | Admitting: Medical

## 2024-08-28 ENCOUNTER — Inpatient Hospital Stay (HOSPITAL_BASED_OUTPATIENT_CLINIC_OR_DEPARTMENT_OTHER): Admitting: Medical Oncology

## 2024-08-28 ENCOUNTER — Encounter: Payer: Self-pay | Admitting: Medical Oncology

## 2024-08-28 VITALS — BP 159/57 | HR 60 | Temp 98.5°F | Resp 18 | Ht 61.0 in | Wt 105.1 lb

## 2024-08-28 DIAGNOSIS — E538 Deficiency of other specified B group vitamins: Secondary | ICD-10-CM

## 2024-08-28 DIAGNOSIS — D500 Iron deficiency anemia secondary to blood loss (chronic): Secondary | ICD-10-CM | POA: Insufficient documentation

## 2024-08-28 DIAGNOSIS — N189 Chronic kidney disease, unspecified: Secondary | ICD-10-CM | POA: Insufficient documentation

## 2024-08-28 DIAGNOSIS — D631 Anemia in chronic kidney disease: Secondary | ICD-10-CM | POA: Diagnosis not present

## 2024-08-28 DIAGNOSIS — N179 Acute kidney failure, unspecified: Secondary | ICD-10-CM

## 2024-08-28 LAB — CBC WITH DIFFERENTIAL (CANCER CENTER ONLY)
Abs Immature Granulocytes: 0.04 K/uL (ref 0.00–0.07)
Basophils Absolute: 0.1 K/uL (ref 0.0–0.1)
Basophils Relative: 2 %
Eosinophils Absolute: 0.1 K/uL (ref 0.0–0.5)
Eosinophils Relative: 2 %
HCT: 36.1 % (ref 36.0–46.0)
Hemoglobin: 11.5 g/dL — ABNORMAL LOW (ref 12.0–15.0)
Immature Granulocytes: 1 %
Lymphocytes Relative: 11 %
Lymphs Abs: 0.4 K/uL — ABNORMAL LOW (ref 0.7–4.0)
MCH: 27.8 pg (ref 26.0–34.0)
MCHC: 31.9 g/dL (ref 30.0–36.0)
MCV: 87.2 fL (ref 80.0–100.0)
Monocytes Absolute: 0.2 K/uL (ref 0.1–1.0)
Monocytes Relative: 4 %
Neutro Abs: 3.3 K/uL (ref 1.7–7.7)
Neutrophils Relative %: 80 %
Platelet Count: 287 K/uL (ref 150–400)
RBC: 4.14 MIL/uL (ref 3.87–5.11)
RDW: 13.1 % (ref 11.5–15.5)
WBC Count: 4.1 K/uL (ref 4.0–10.5)
nRBC: 0 % (ref 0.0–0.2)

## 2024-08-28 LAB — RETIC PANEL
Immature Retic Fract: 8.6 % (ref 2.3–15.9)
RBC.: 4.06 MIL/uL (ref 3.87–5.11)
Retic Count, Absolute: 43 K/uL (ref 19.0–186.0)
Retic Ct Pct: 1.1 % (ref 0.4–3.1)
Reticulocyte Hemoglobin: 29.7 pg (ref >27.9)

## 2024-08-28 LAB — IRON AND IRON BINDING CAPACITY (CC-WL,HP ONLY)
Iron: 59 ug/dL (ref 28–170)
Saturation Ratios: 16 % (ref 10.4–31.8)
TIBC: 379 ug/dL (ref 250–450)
UIBC: 320 ug/dL

## 2024-08-28 LAB — VITAMIN B12: Vitamin B-12: 328 pg/mL (ref 180–914)

## 2024-08-28 LAB — FERRITIN: Ferritin: 204 ng/mL (ref 11–307)
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
## 2024-08-29 ENCOUNTER — Other Ambulatory Visit: Payer: Self-pay | Admitting: Medical

## 2024-08-30 ENCOUNTER — Ambulatory Visit: Payer: Self-pay | Admitting: Gastroenterology

## 2024-09-02 ENCOUNTER — Ambulatory Visit: Payer: Self-pay | Admitting: Medical Oncology

## 2024-09-02 NOTE — Telephone Encounter (Signed)
-----   Message from Lauraine CHRISTELLA Dais sent at 09/02/2024  1:26 PM EDT ----- Iron is good but B12 is still low. I would suggest she double her B12 supplement or consider B12 injections. Injections are given once weekly for 4 weeks then once monthly  ----- Message ----- From: Interface, Lab In [place] Sent: 08/28/2024  11:05 AM EDT To: Lauraine CHRISTELLA Dais, PA-C

## 2024-09-02 NOTE — Telephone Encounter (Signed)
 Called and informed patient and husband of of lab results. Patient  and husband verbalized understanding and would like to try OTC supplement first. Advised pt to take B12 1000 mcg 1 a day. Will recheck lab in 2 month per [place]. Scheduling message sent.
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
## 2024-09-07 ENCOUNTER — Other Ambulatory Visit: Payer: Self-pay | Admitting: Medical

## 2024-09-15 ENCOUNTER — Telehealth: Payer: Self-pay

## 2024-09-15 NOTE — Telephone Encounter (Signed)
 Contacted patient to advise that We can reschedule her follow-up, I recommend for 8 weeks after starting the Plaquenil  to give enough time to work.  Verbalized understanding and got FU scheduled for 12/09/2024

## 2024-09-15 NOTE — Telephone Encounter (Signed)
 Husband called the office stating that patient is over the diverticulitis and is ready to start the PLQ and the flexeril  as she was previously told to hold it. He is also asking if the follow up appointment on 09/17/2024 is necessary since the patient has not taking the medication yet and this was supposed to be a follow up after starting.   Please advise

## 2024-09-15 NOTE — Telephone Encounter (Signed)
 We can reschedule her follow-up, I recommend for 8 weeks after starting the Plaquenil  to give enough time to work.

## 2024-09-17 ENCOUNTER — Ambulatory Visit: Admitting: Internal Medicine

## 2024-09-17 DIAGNOSIS — M797 Fibromyalgia: Secondary | ICD-10-CM

## 2024-09-17 DIAGNOSIS — M329 Systemic lupus erythematosus, unspecified: Secondary | ICD-10-CM

## 2024-09-18 ENCOUNTER — Inpatient Hospital Stay

## 2024-09-18 ENCOUNTER — Inpatient Hospital Stay: Attending: Hematology & Oncology

## 2024-09-18 ENCOUNTER — Inpatient Hospital Stay: Admitting: Medical Oncology

## 2024-09-18 VITALS — BP 116/38 | HR 66 | Temp 98.0°F | Resp 17 | Wt 106.4 lb

## 2024-09-18 DIAGNOSIS — D500 Iron deficiency anemia secondary to blood loss (chronic): Secondary | ICD-10-CM

## 2024-09-18 DIAGNOSIS — E538 Deficiency of other specified B group vitamins: Secondary | ICD-10-CM

## 2024-09-18 DIAGNOSIS — D631 Anemia in chronic kidney disease: Secondary | ICD-10-CM

## 2024-09-18 DIAGNOSIS — N189 Chronic kidney disease, unspecified: Secondary | ICD-10-CM | POA: Diagnosis present

## 2024-09-18 DIAGNOSIS — E639 Nutritional deficiency, unspecified: Secondary | ICD-10-CM | POA: Diagnosis not present

## 2024-09-18 LAB — IRON AND IRON BINDING CAPACITY (CC-WL,HP ONLY)
Iron: 73 ug/dL (ref 28–170)
Saturation Ratios: 19 % (ref 10.4–31.8)
TIBC: 384 ug/dL (ref 250–450)
UIBC: 311 ug/dL

## 2024-09-18 LAB — RETIC PANEL
Immature Retic Fract: 8 % (ref 2.3–15.9)
RBC.: 4.03 MIL/uL (ref 3.87–5.11)
Retic Count, Absolute: 50 K/uL (ref 19.0–186.0)
Retic Ct Pct: 1.2 % (ref 0.4–3.1)
Reticulocyte Hemoglobin: 32.3 pg (ref >27.9)

## 2024-09-18 LAB — CMP (CANCER CENTER ONLY)
ALT: 6 U/L (ref 0–44)
AST: 25 U/L (ref 15–41)
Albumin: 4.1 g/dL (ref 3.5–5.0)
Alkaline Phosphatase: 45 U/L (ref 38–126)
Anion gap: 11 (ref 5–15)
BUN: 23 mg/dL (ref 8–23)
CO2: 21 mmol/L — ABNORMAL LOW (ref 22–32)
Calcium: 9.5 mg/dL (ref 8.9–10.3)
Chloride: 102 mmol/L (ref 98–111)
Creatinine: 1.53 mg/dL — ABNORMAL HIGH (ref 0.44–1.00)
GFR, Estimated: 36 mL/min — ABNORMAL LOW (ref >60)
Glucose, Bld: 239 mg/dL — ABNORMAL HIGH (ref 70–99)
Potassium: 4.6 mmol/L (ref 3.5–5.1)
Sodium: 134 mmol/L — ABNORMAL LOW (ref 135–145)
Total Bilirubin: 0.4 mg/dL (ref 0.0–1.2)
Total Protein: 7 g/dL (ref 6.5–8.1)

## 2024-09-18 LAB — CBC WITH DIFFERENTIAL (CANCER CENTER ONLY)
Abs Immature Granulocytes: 0.04 K/uL (ref 0.00–0.07)
Basophils Absolute: 0.1 K/uL (ref 0.0–0.1)
Basophils Relative: 1 %
Eosinophils Absolute: 0.1 K/uL (ref 0.0–0.5)
Eosinophils Relative: 2 %
HCT: 34.4 % — ABNORMAL LOW (ref 36.0–46.0)
Hemoglobin: 11.3 g/dL — ABNORMAL LOW (ref 12.0–15.0)
Immature Granulocytes: 1 %
Lymphocytes Relative: 10 %
Lymphs Abs: 0.4 K/uL — ABNORMAL LOW (ref 0.7–4.0)
MCH: 28 pg (ref 26.0–34.0)
MCHC: 32.8 g/dL (ref 30.0–36.0)
MCV: 85.1 fL (ref 80.0–100.0)
Monocytes Absolute: 0.2 K/uL (ref 0.1–1.0)
Monocytes Relative: 4 %
Neutro Abs: 3.7 K/uL (ref 1.7–7.7)
Neutrophils Relative %: 82 %
Platelet Count: 263 K/uL (ref 150–400)
RBC: 4.04 MIL/uL (ref 3.87–5.11)
RDW: 12.5 % (ref 11.5–15.5)
WBC Count: 4.5 K/uL (ref 4.0–10.5)
nRBC: 0 % (ref 0.0–0.2)

## 2024-09-18 LAB — VITAMIN B12: Vitamin B-12: 663 pg/mL (ref 180–914)

## 2024-09-18 LAB — FERRITIN: Ferritin: 220 ng/mL (ref 11–307)
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
## 2024-09-22 ENCOUNTER — Ambulatory Visit: Payer: Self-pay

## 2024-09-22 NOTE — Telephone Encounter (Signed)
 LMTCB- unable to leave detailed message- no DPR.

## 2024-09-22 NOTE — Telephone Encounter (Signed)
-----   Message from Lauraine CHRISTELLA Dais sent at 09/22/2024  2:22 PM EST ----- B12 is already looking better- keep up B12 supplement. Iron levels look good. Continue follow up with her primary care regarding the chronic changes in her CMP.  ----- Message ----- From: Interface, [facility] [place] Sent: 09/18/2024  10:56 AM EST To: Lauraine CHRISTELLA Dais, PA-C

## 2024-09-23 ENCOUNTER — Encounter: Payer: Self-pay | Admitting: Hematology & Oncology

## 2024-09-23 ENCOUNTER — Encounter: Payer: Self-pay | Admitting: Cardiology

## 2024-09-23 NOTE — Telephone Encounter (Signed)
 This nurse left a message for patient  informing her that the Vitamin B 12 level is already looking better. Continue taking the B12 supplement. Iron levels look good. Continue to follow up with your PCP regarding the chronic changes in your CMP. If you have any questions or concerns, please call or send a MyChart message.

## 2024-09-23 NOTE — Telephone Encounter (Signed)
-----   Message from Lauraine CHRISTELLA Dais sent at 09/22/2024  2:22 PM EST ----- B12 is already looking better- keep up B12 supplement. Iron levels look good. Continue follow up with her primary care regarding the chronic changes in her CMP.  ----- Message ----- From: Interface, [facility] [place] Sent: 09/18/2024  10:56 AM EST To: Lauraine CHRISTELLA Dais, PA-C

## 2024-09-25 ENCOUNTER — Other Ambulatory Visit: Payer: Self-pay | Admitting: Medical

## 2024-09-25 ENCOUNTER — Ambulatory Visit: Payer: Self-pay | Admitting: *Deleted

## 2024-09-25 ENCOUNTER — Telehealth: Payer: Self-pay

## 2024-09-25 NOTE — Telephone Encounter (Signed)
 FYI Only or Action Required?: Action required by provider: request for appointment.  Patient was last seen in primary care on 07/31/2024 by Dorina Loving, PA-C.  Called Nurse Triage reporting Fall.  Symptoms began unknown- declined triage.    Triage Disposition: No disposition on file.  Patient/caregiver understands and will follow disposition?: See note- husband calling for appointment  Copied from CRM [id number]. Topic: Clinical - Red Word Triage >> Sep 25, 2024  9:02 AM Revonda D wrote: Red Word that prompted transfer to Nurse Triage: fell, pain  Pt's husband stated that the pt fell recently and is now experiencing pain on her right side. Pt would like to get an appt scheduled with the provider. Answer Assessment - Initial Assessment Questions Patient's husband is calling- he declines to answer triage questions- he states he just wants to see PCP. Wants to discuss pain from fall and medication issues with recently changes/prescribed medications. Wants to be seen today. Advised I would send message to PCP. Please call home number in response  8. PAIN: Is there pain? If Yes, ask: How bad is the pain? (e.g., Scale 0-10; none, mild, moderate, severe)     A lot of pain on R side  Protocols used: Chest Injury-A-AH

## 2024-09-25 NOTE — Telephone Encounter (Signed)
 No documentation in chart of the call for me to return pts call   Copied from CRM #[id number]. Topic: General - Call Back - No Documentation >> Sep 25, 2024 12:53 PM Viola F wrote: Reason for CRM: Patient spouse Ryan returned call to the office but there is no documentation, says call got disconnected. Please call back [phone] [facility])

## 2024-09-26 ENCOUNTER — Ambulatory Visit: Admitting: Internal Medicine

## 2024-09-27 ENCOUNTER — Emergency Department (HOSPITAL_BASED_OUTPATIENT_CLINIC_OR_DEPARTMENT_OTHER)
Admission: EM | Admit: 2024-09-27 | Discharge: 2024-09-27 | Disposition: A | Discharge status: Home / Self Care | Attending: Emergency Medicine | Admitting: Emergency Medicine

## 2024-09-27 ENCOUNTER — Emergency Department (HOSPITAL_BASED_OUTPATIENT_CLINIC_OR_DEPARTMENT_OTHER)

## 2024-09-27 ENCOUNTER — Encounter (HOSPITAL_BASED_OUTPATIENT_CLINIC_OR_DEPARTMENT_OTHER): Payer: Self-pay | Admitting: Emergency Medicine

## 2024-09-27 ENCOUNTER — Other Ambulatory Visit: Payer: Self-pay

## 2024-09-27 DIAGNOSIS — Z79899 Other long term (current) drug therapy: Secondary | ICD-10-CM | POA: Insufficient documentation

## 2024-09-27 DIAGNOSIS — E119 Type 2 diabetes mellitus without complications: Secondary | ICD-10-CM | POA: Insufficient documentation

## 2024-09-27 DIAGNOSIS — W182XXA Fall in (into) shower or empty bathtub, initial encounter: Secondary | ICD-10-CM | POA: Insufficient documentation

## 2024-09-27 DIAGNOSIS — Z794 Long term (current) use of insulin: Secondary | ICD-10-CM | POA: Diagnosis not present

## 2024-09-27 DIAGNOSIS — S2241XA Multiple fractures of ribs, right side, initial encounter for closed fracture: Secondary | ICD-10-CM | POA: Insufficient documentation

## 2024-09-27 MED ORDER — HYDROCODONE-ACETAMINOPHEN 5-325 MG PO TABS: 2.0000 | ORAL_TABLET | ORAL | 0 refills | Status: DC | PRN

## 2024-09-27 MED ORDER — OXYCODONE-ACETAMINOPHEN 5-325 MG PO TABS
1.0000 | ORAL_TABLET | Freq: Once | ORAL | Status: AC
  Administered 2024-09-27: 1 via ORAL
  Filled 2024-09-27: qty 1

## 2024-09-27 NOTE — Discharge Instructions (Signed)
 You were seen in the emergency department today for concerns of a fall.  Your x-ray of your right sided chest does appear to show concerns for fractures to the 7th and 8th ribs on the side portion underneath your underarm.  This is consistent with the area of pain that you are feeling.  I have started you on a prescription pain medication that you should take as needed for pain over the next several days.  Please follow-up closely with your primary care provider to ensure you are improving with your symptoms.  If you begin to experience any productive cough, shortness of breath, or fevers, return to the emergency department or seek medical evaluation.

## 2024-09-27 NOTE — ED Triage Notes (Signed)
 Pt fell onto side of bathtub yesterday; c/o RT rib pain; denies hitting head

## 2024-09-27 NOTE — ED Provider Notes (Signed)
  EMERGENCY DEPARTMENT AT [facility] Provider Note   CSN: [id number] Arrival date & time: 09/27/24  1146     Patient presents with: Jasmine Keith is a 73 y.o. female.  Patient with past history significant for type 2 diabetes, fibromyalgia, osteoporosis, smoking history, lupus presents to the emergency department with concerns of a fall.  Reports that she was in her bathtub running down when she slipped and landed on the right side of her ribs.  Dors is pain in this area.  Denies head impact, neck pain, or loss of consciousness.  Reports her pain is severe at this time and is not well-controlled on Tylenol  alone.  Denies any feelings of chest pain or shortness of breath.   Fall       Prior to Admission medications   Medication Sig Start Date End Date Taking? Authorizing Provider  HYDROcodone -acetaminophen  (NORCO/VICODIN) 5-325 MG tablet Take 2 tablets by mouth every 4 (four) hours as needed. 09/27/24  Yes Lanier Millon A, PA-C  acetaminophen  (TYLENOL ) 325 MG tablet Take 2 tablets (650 mg total) by mouth every 6 (six) hours as needed for mild pain (or Fever >/= 101). 05/29/22   Swayze, Ava, DO  alendronate  (FOSAMAX ) 70 MG tablet TAKE 1 TABLET BY MOUTH ONCE A WEEK. TAKE WITH A FULL GLASS OF WATER ON AN EMPTY STOMACH. 06/14/23   Saguier, Dallas, PA-C  amLODipine  (NORVASC ) 5 MG tablet TAKE 1 TABLET (5 MG TOTAL) BY MOUTH DAILY. 07/03/24   Revankar, Jennifer SAUNDERS, MD  busPIRone  (BUSPAR ) 7.5 MG tablet TAKE 1 TABLET BY MOUTH 2 TIMES DAILY. 08/04/24   Saguier, Dallas, PA-C  carvedilol  (COREG ) 6.25 MG tablet TAKE 1 TABLET BY MOUTH TWICE A DAY WITH FOOD 08/04/24   Saguier, Dallas, PA-C  dicyclomine  (BENTYL ) 10 MG capsule TAKE 1 CAPSULE (10 MG TOTAL) BY MOUTH 4 TIMES A DAY BEFORE MEALS AND AT BEDTIME 08/04/24   Saguier, Dallas, PA-C  diphenhydrAMINE  (BENADRYL ) 50 MG tablet Take 1 tablet (50 mg total) by mouth every 6 (six) hours as needed for itching or allergies. 07/09/24   Christobal Guadalajara, MD  EPINEPHrine  0.3 mg/0.3 mL IJ SOAJ injection Inject 0.3 mg into the muscle once as needed (acute allergic reaction). 07/09/24   Christobal Guadalajara, MD  fenofibrate  160 MG tablet TAKE 1 TABLET BY MOUTH EVERY DAY 04/16/24   Saguier, Dallas, PA-C  fluticasone  (FLONASE ) 50 MCG/ACT nasal spray Place 2 sprays into both nostrils daily. 08/23/23   Saguier, Dallas, PA-C  gabapentin  (NEURONTIN ) 300 MG capsule TAKE 1 CAPSULE BY MOUTH TWICE A DAY 08/29/24   Saguier, Dallas, PA-C  hydrALAZINE  (APRESOLINE ) 100 MG tablet TAKE 1 TABLET BY MOUTH 3 TIMES DAILY. 09/25/24   Saguier, Dallas, PA-C  hydrALAZINE  (APRESOLINE ) 50 MG tablet TAKE 1 TABLET BY MOUTH THREE TIMES A DAY 09/08/24   Saguier, Dallas, PA-C  hydroxychloroquine  (PLAQUENIL ) 200 MG tablet Take 200 mg by mouth daily. 07/27/24   [provider]  inclisiran (LEQVIO ) 284 MG/1.5ML SOSY injection Inject 1.5 mLs (284 mg total) into the skin every 6 (six) months. 03/13/24   Revankar, Jennifer SAUNDERS, MD  Insulin  Glargine Solostar (LANTUS ) 100 UNIT/ML Solostar Pen Inject 30 Units into the skin daily. 11/14/23   Saguier, Dallas, PA-C  Insulin  Pen Needle (BD PEN NEEDLE NANO U/F) 32G X 4 MM MISC Use one needle daily to inject insulin  11/16/22   Saguier, Dallas, PA-C  Insulin  Pen Needle (PEN NEEDLES 3/16) 31G X 5 MM MISC Inject 8 Units  into the skin daily. 09/07/23   Saguier, Dallas, PA-C  levothyroxine  (SYNTHROID ) 25 MCG tablet TAKE 1 TABLET BY MOUTH EVERY DAY BEFORE BREAKFAST 12/31/23   Saguier, Dallas, PA-C  loratadine  (CLARITIN ) 10 MG tablet Take 1 tablet (10 mg total) by mouth daily for 7 days. 07/10/24 08/28/24  Christobal Guadalajara, MD  Magnesium  250 MG TABS Take 4 tablets by mouth daily.    [provider]  meclizine  (ANTIVERT ) 12.5 MG tablet Take 1 tablet (12.5 mg total) by mouth 3 (three) times daily as needed for dizziness. 06/26/24   Saguier, Dallas, PA-C  menthol -cetylpyridinium (CEPACOL) 3 MG lozenge Take 1 lozenge (3 mg total) by mouth as needed for sore throat.  07/09/24   Christobal Guadalajara, MD  pantoprazole  (PROTONIX ) 40 MG tablet TAKE 1 TABLET BY MOUTH EVERY DAY 04/16/24   Saguier, Dallas, PA-C  promethazine -dextromethorphan (PROMETHAZINE -DM) 6.25-15 MG/5ML syrup Take 5 mLs by mouth 4 (four) times daily as needed. 06/10/24   Antonio Cyndee Rockers R, DO  valsartan  (DIOVAN ) 80 MG tablet TAKE 1 TABLET BY MOUTH EVERY DAY 08/04/24   Revankar, Jennifer SAUNDERS, MD  venlafaxine  XR (EFFEXOR -XR) 75 MG 24 hr capsule Take 1 capsule (75 mg total) by mouth daily with breakfast. 08/28/24   Saguier, Dallas, PA-C    Allergies: Augmentin  [amoxicillin -pot clavulanate], Lipitor [atorvastatin ], Lovastatin, and Rosuvastatin    Review of Systems  Musculoskeletal:        Right side rib pain  All other systems reviewed and are negative.   Updated Vital Signs BP (!) 150/64   Pulse 67   Temp 98.3 F (36.8 C)   Resp 16   Ht 5' 1 (1.549 m)   Wt 48.5 kg   SpO2 97%   BMI 20.22 kg/m   Physical Exam Vitals and nursing note reviewed.  Constitutional:      General: She is not in acute distress.    Appearance: She is well-developed.  HENT:     Head: Normocephalic and atraumatic.  Eyes:     Conjunctiva/sclera: Conjunctivae normal.  Cardiovascular:     Rate and Rhythm: Normal rate and regular rhythm.     Heart sounds: No murmur heard. Pulmonary:     Effort: Pulmonary effort is normal. No respiratory distress.     Breath sounds: Normal breath sounds.  Abdominal:     Palpations: Abdomen is soft.     Tenderness: There is no abdominal tenderness.  Musculoskeletal:        General: Tenderness present. No swelling or deformity.       Arms:     Cervical back: Neck supple.     Comments: Pain along the lateral and posterior right lower ribs. No appreciable bruising, swelling, or deformity.  Skin:    General: Skin is warm and dry.     Capillary Refill: Capillary refill takes less than 2 seconds.  Neurological:     Mental Status: She is alert.  Psychiatric:        Mood and Affect: Mood  normal.     (all labs ordered are listed, but only abnormal results are displayed) Labs Reviewed - No data to display  EKG: None  Radiology: DG Ribs Unilateral W/Chest Right Result Date: 09/27/2024 CLINICAL DATA:  Fall and right chest wall pain. EXAM: RIGHT RIBS AND CHEST - 3+ VIEW COMPARISON:  Chest radiograph dated 07/03/2024. FINDINGS: No focal consolidation, pleural effusion or pneumothorax. The cardiac silhouette is within normal limits. Median sternotomy wires and CABG vascular clips. Mildly displaced fractures of the anterolateral  right seventh and eighth ribs versus less likely chronic changes. Correlation with point tenderness recommended. IMPRESSION: Probable acute fractures of the anterolateral right seventh and eighth ribs. No pneumothorax. Correlation with point tenderness recommended. Electronically Signed   By: Vanetta Chou M.D.   On: 09/27/2024 13:26     Procedures   Medications Ordered in the ED  oxyCODONE -acetaminophen  (PERCOCET/ROXICET) 5-325 MG per tablet 1 tablet (1 tablet Oral Given 09/27/24 1250)                                    Medical Decision Making Amount and/or Complexity of Data Reviewed Radiology: ordered.  Risk Prescription drug management.   This patient presents to the ED for concern of fall, rib pain.  Differential diagnosis includes rib fracture,    Additional history obtained:  Additional history obtained from chart review   Imaging Studies ordered:  I ordered imaging studies including xray right ribs  I independently visualized and interpreted imaging which showed probable rib fractures at the 7th and 8th anterolateral right-sided rib I agree with the radiologist interpretation   Medicines ordered and prescription drug management:  I ordered medication including Percocet for pain Reevaluation of the patient after these medicines showed that the patient improved I have reviewed the patients home medicines and have made  adjustments as needed   Problem List / ED Course:  Patient presents to the emergency department today with concerns of a fall.  Reportedly had a fall while trying to get out of her bathtub yesterday.  Landed on the right side of her chest/ribs endorsing pain in this area.  She denies any head impact or head strike.  No reported loss of consciousness.  Denies any feelings of dizziness, lightheadedness, nausea, vomiting, or chest pain prior to this fall. On exam, patient has notable tenderness to the lateral portions of the right lower ribs as well as the posterior aspect.  No significant pain on the anterior portion.  Normal heart and lung sounds.  No obvious signs of diminished or absent lung sounds. X-ray of the right ribs obtained which shows probable fractures of the right anterior lateral 7th and 8th ribs.  Percocet given for pain control and appears to have improved pain. Given no signs of respiratory distress or airway compromise, will discharge patient home with an incentive spirometer and pain medicine.  Caution patient return to the emergency department if she begins to experience reductive cough, shortness of breath, fever, chills or bodyaches.  Advise close follow-up with PCP and discharged home in stable condition   Social Determinants of Health:  None  Final diagnoses:  Closed fracture of multiple ribs of right side, initial encounter    ED Discharge Orders          Ordered    HYDROcodone -acetaminophen  (NORCO/VICODIN) 5-325 MG tablet  Every 4 hours PRN        09/27/24 1412               Kiwanna Spraker A, PA-C 09/27/24 1627    Horton, Roxie HERO, DO 09/28/24 [id number]

## 2024-09-27 NOTE — ED Notes (Signed)
 Pt provided incentive spirometer and educated on how to use it. Patient verbalized understanding.

## 2024-09-30 ENCOUNTER — Ambulatory Visit: Payer: Self-pay

## 2024-09-30 MED ORDER — HYDROCODONE-ACETAMINOPHEN 5-325 MG PO TABS: ORAL_TABLET | ORAL | 0 refills | Status: DC

## 2024-09-30 NOTE — Addendum Note (Signed)
 Addended by: DORINA DALLAS HERO on: 09/30/2024 02:04 PM   Modules accepted: Orders

## 2024-09-30 NOTE — Telephone Encounter (Signed)
  FYI Only or Action Required?: Action required by provider: medication refill request.  Patient was last seen in primary care on 07/31/2024 by Dorina Loving, PA-C.  Called Nurse Triage reporting Rib Injury.  Symptoms began several days ago.  Interventions attempted: Prescription medications: pain medication.  Symptoms are: unchanged.Pt. Seen in ED Sunday after a fall, rib injury. Appointment tomorrow. Runs out of pain medication today, asking for refill today. Call pt.'s cell phone.  Triage Disposition: Call PCP Now  Patient/caregiver understands and will follow disposition?: Yes     Copied from CRM #[id number]. Topic: Clinical - Red Word Triage >> Sep 30, 2024  9:17 AM Logan F wrote: Red Word that prompted transfer to Nurse Triage: Jasmine Keith to the ER on Sunday due to a fall and they said she has fractured ribs.  She was told to follow up with her pcp but she says she is almost out of her meds and the pain is unbearable. Pco next avail is Dec 1st which is too long Reason for Disposition  [1] Caller has URGENT question AND [2] triager unable to answer question  Answer Assessment - Initial Assessment Questions 1. MECHANISM: How did the fall happen?     Fell in bathroom 2. DOMESTIC VIOLENCE AND ELDER ABUSE SCREENING: Did you fall because someone pushed you or tried to hurt you? If Yes, ask: Are you safe now?     no 3. ONSET: When did the fall happen? (e.g., minutes, hours, or days ago)     Sunday 4. LOCATION: What part of the body hit the ground? (e.g., back, buttocks, head, hips, knees, hands, head, stomach)     side 5. INJURY: Did you hurt (injure) yourself when you fell? If Yes, ask: What did you injure? Tell me more about this? (e.g., body area; type of injury; pain severity)     Fractured ribs 6. PAIN: Is there any pain? If Yes, ask: How bad is the pain? (e.g., Scale 0-10; or none, mild,      severe 7. SIZE: For cuts, bruises, or swelling, ask: How large is it?  (e.g., inches or centimeters)      no 8. PREGNANCY: Is there any chance you are pregnant? When was your last menstrual period?     no 9. OTHER SYMPTOMS: Do you have any other symptoms? (e.g., dizziness, fever, weakness; new-onset or worsening).      no 10. CAUSE: What do you think caused the fall (or falling)? (e.g., dizzy spell, tripped)       slipped  Protocols used: Falls and [facility]

## 2024-09-30 NOTE — Telephone Encounter (Signed)
 Patient's husband calls in stating they received a missed call, but no documentation of attempt in chart; Pt and husband ask to be contacted on one of their cell phone numbers: Call Jasmine Keith(Husband) [phone] first, back up number [phone](patient)

## 2024-10-01 ENCOUNTER — Ambulatory Visit: Admitting: Medical

## 2024-10-01 VITALS — BP 140/58 | HR 78 | Temp 97.9°F | Resp 15 | Ht 61.0 in | Wt 108.0 lb

## 2024-10-01 DIAGNOSIS — W19XXXD Unspecified fall, subsequent encounter: Secondary | ICD-10-CM | POA: Diagnosis not present

## 2024-10-01 DIAGNOSIS — Z79899 Other long term (current) drug therapy: Secondary | ICD-10-CM | POA: Diagnosis not present

## 2024-10-01 DIAGNOSIS — S2241XA Multiple fractures of ribs, right side, initial encounter for closed fracture: Secondary | ICD-10-CM

## 2024-10-01 DIAGNOSIS — R269 Unspecified abnormalities of gait and mobility: Secondary | ICD-10-CM

## 2024-10-01 NOTE — Progress Notes (Signed)
 Subjective:    Patient ID: [name], female    DOB: 09-01-1951, 73 y.o.   MRN: [id number]  HPI Pt in for follow up from the ED. Below are notes from that day of service.   Arrival date & time: 09/27/24  1146       Patient presents with: Brizeyda Holtmeyer is a 73 y.o. female.  Patient with past history significant for type 2 diabetes, fibromyalgia, osteoporosis, smoking history, lupus presents to the emergency department with concerns of a fall.  Reports that she was in her bathtub running down when she slipped and landed on the right side of her ribs.  Dors is pain in this area.  Denies head impact, neck pain, or loss of consciousness.  Reports her pain is severe at this time and is not well-controlled on Tylenol  alone.  Denies any feelings of chest pain or shortness of breath.  Problem List / ED Course:   Patient presents to the emergency department today with concerns of a fall.  Reportedly had a fall while trying to get out of her bathtub yesterday.  Landed on the right side of her chest/ribs endorsing pain in this area.  She denies any head impact or head strike.  No reported loss of consciousness.  Denies any feelings of dizziness, lightheadedness, nausea, vomiting, or chest pain prior to this fall. On exam, patient has notable tenderness to the lateral portions of the right lower ribs as well as the posterior aspect.  No significant pain on the anterior portion.  Normal heart and lung sounds.  No obvious signs of diminished or absent lung sounds. X-ray of the right ribs obtained which shows probable fractures of the right anterior lateral 7th and 8th ribs.  Percocet given for pain control and appears to have improved pain. Given no signs of respiratory distress or airway compromise, will discharge patient home with an incentive spirometer and pain medicine.  Caution patient return to the emergency department if she begins to experience reductive cough, shortness  of breath, fever, chills or bodyaches.  Advise close follow-up with PCP and discharged home in stable condition     Social Determinants of Health:   None   Final diagnoses:  Closed fracture of multiple ribs of right side, initial encounter      ED Discharge Orders             Ordered      HYDROcodone -acetaminophen  (NORCO/VICODIN) 5-325 MG tablet  Every 4 hours PRN        09/27/24 1412        Pt called for refill of pain meds yesterday before todays visit as she was running out. So I went ahead and sent in refill.   Leoni J Bayon is a 73 year old female who presents with rib pain following a fall.  She fell in the bathroom and hit the corner of the tub, resulting in right seventh and eighth rib fractures. Pain is severe at 8-9/10 and worsens with walking and twisting. Muscle spasms are present, and she avoids deep breathing because it worsens the pain.  She is taking Norco (hydrocodone  with Tylenol ) 2 tablets every 6 hours. The emergency room provided a limited supply, and she is here for a refill. She also takes gabapentin , which she staggers with Norco to limit sedation.  Her husband reports she is using an incentive spirometer. She has previously used lidocaine  patches for pain control.   Review  of Systems See hpi    Objective:   Physical Exam  General- No acute distress. Pleasant patient. Neck- Full range of motion, no jvd Lungs- Clear, even and unlabored. Heart- regular rate and rhythm. Neurologic- CNII- XII grossly intact.  Rt posterior ribs tender to palpation and deep range of motion.      Assessment & Plan:  Multiple right rib fractures with acute pain Acute pain from right rib fractures, severe, exacerbated by movement and deep breathing. Current management includes hydrocodone  with acetaminophen . Gabapentin  may cause drowsiness with narcotics. NSAIDs contraindicated due to renal concerns. Pain expected to persist 6-8 weeks with gradual improvement.  Lidocaine  patches may help. - Continue hydrocodone  with acetaminophen , two tablets every six hours as needed. - Use lidocaine  patches over the counter. - Avoid NSAIDs due to renal concerns. - Stagger gabapentin  and hydrocodone  to prevent sedation. - Sign pain management contract and complete urine drug screen for larger prescription. - Call Monday for refill and report average pain level.  History of falls and recent recurrent. -place new PT referral and will ask staff to get scheduled in [place]  Follow up date to be determined after update on monday   Addilyne Backs, PA-C

## 2024-10-01 NOTE — Addendum Note (Signed)
 Addended by: GERARD CHUCKIE SAILOR on: 10/01/2024 02:40 PM   Modules accepted: Orders

## 2024-10-01 NOTE — Patient Instructions (Addendum)
 Multiple right rib fractures with acute pain Acute pain from right rib fractures, severe, exacerbated by movement and deep breathing. Current management includes hydrocodone  with acetaminophen . Gabapentin  may cause drowsiness with narcotics. NSAIDs contraindicated due to renal concerns. Pain expected to persist 6-8 weeks with gradual improvement. Lidocaine  patches may help. - Continue hydrocodone  with acetaminophen , two tablets every six hours as needed. - Use lidocaine  patches over the counter. - Avoid NSAIDs due to renal concerns. - Stagger gabapentin  and hydrocodone  to prevent sedation. - Sign pain management contract and complete urine drug screen for larger prescription. - Call Monday for refill and report average pain level.  History of falls and recent recurrent. -place new PT referral and will ask staff to get scheduled in [place]  Follow up date to be determined after update on Monday

## 2024-10-03 ENCOUNTER — Other Ambulatory Visit: Payer: Self-pay | Admitting: Medical

## 2024-10-03 LAB — DM TEMPLATE

## 2024-10-04 ENCOUNTER — Ambulatory Visit: Payer: Self-pay | Admitting: Medical

## 2024-10-04 LAB — DRUG MONITORING PANEL 376104, URINE
Benzodiazepines: NEGATIVE ng/mL (ref <100)
Cocaine Metabolite: NEGATIVE ng/mL (ref <150)
Codeine: NEGATIVE ng/mL (ref <50)
Desmethyltramadol: NEGATIVE ng/mL (ref <100)
Hydrocodone: 1657 ng/mL — ABNORMAL HIGH (ref <50)
Hydromorphone: 303 ng/mL — ABNORMAL HIGH (ref <50)
Morphine: NEGATIVE ng/mL (ref <50)
Norhydrocodone: 3691 ng/mL — ABNORMAL HIGH (ref <50)
Opiates: POSITIVE ng/mL — AB (ref <100)
Oxycodone: NEGATIVE ng/mL (ref <100)
Tramadol: NEGATIVE ng/mL (ref <100)
Tramadol: NEGATIVE ng/mL (ref <500)

## 2024-10-04 LAB — DM TEMPLATE

## 2024-10-08 ENCOUNTER — Telehealth: Payer: Self-pay | Admitting: Medical

## 2024-10-08 NOTE — Telephone Encounter (Unsigned)
 Copied from CRM #[id number]. Topic: Clinical - Medication Refill >> Oct 08, 2024 11:16 AM Mercedes MATSU wrote: Medication:  HYDROcodone -acetaminophen  (NORCO/VICODIN) 5-325 MG tablet    Has the patient contacted their pharmacy? Yes (Agent: If no, request that the patient contact the pharmacy for the refill. If patient does not wish to contact the pharmacy document the reason why and proceed with request.) (Agent: If yes, when and what did the pharmacy advise?)  This is the patient's preferred pharmacy:  [id number] - [place], [place] - [address] AT [facility] [address] [place] [address] Phone: [phone] Fax: [phone]  Is this the correct pharmacy for this prescription? Yes If no, delete pharmacy and type the correct one.   Has the prescription been filled recently? Yes  Is the patient out of the medication? Yes  Has the patient been seen for an appointment in the last year OR does the patient have an upcoming appointment? Yes  Can we respond through MyChart? Yes  Agent: Please be advised that Rx refills may take up to 3 business days. We ask that you follow-up with your pharmacy.

## 2024-10-09 ENCOUNTER — Inpatient Hospital Stay

## 2024-10-09 ENCOUNTER — Inpatient Hospital Stay: Admitting: Medical Oncology

## 2024-10-09 ENCOUNTER — Ambulatory Visit: Payer: Self-pay

## 2024-10-09 ENCOUNTER — Other Ambulatory Visit: Payer: Self-pay | Admitting: Medical

## 2024-10-09 MED ORDER — HYDROCODONE-ACETAMINOPHEN 5-325 MG PO TABS: ORAL_TABLET | ORAL | 0 refills | Status: AC

## 2024-10-09 NOTE — Telephone Encounter (Signed)
 Duplicate request. Was received on 10/08/24 and has been forwarded to PCP.

## 2024-10-09 NOTE — Telephone Encounter (Signed)
Pt called and aware

## 2024-10-09 NOTE — Telephone Encounter (Signed)
 FYI Only or Action Required?: Action required by provider: medication refill request.  Needs Norco refilled.  Patient was last seen in primary care on 10/01/2024 by Dorina Loving, PA-C.  Called Nurse Triage reporting Pain.  Symptoms began several weeks ago.  Interventions attempted: Prescription medications: Norco.  Symptoms are: unchanged.  Triage Disposition: No disposition on file.  Patient/caregiver understands and will follow disposition?:   Copied from CRM [id number]. Topic: Clinical - Red Word Triage >> Oct 09, 2024  1:02 PM Ivette P wrote: [facility] that prompted transfer to Nurse Triage: Pt is in a lot of pain wants med refill now.     Answer Assessment - Initial Assessment Questions Patient seen recently. Was told to call in and report pain and ask for refill. Asked for a number on the pain scale but spouse didn't give number just that she in excrutiating pain.   1. LOCATION: Where does it hurt?      Rib pain 2. RADIATION: Does the pain shoot anywhere else? (e.g., chest, back)     *No Answer* 3. ONSET: When did the pain begin? (e.g., minutes, hours or days ago)      *No Answer* 4. SUDDEN: Gradual or sudden onset?     *No Answer* 5. PATTERN Does the pain come and go, or is it constant?     *No Answer* 6. SEVERITY: How bad is the pain?  (e.g., Scale 1-10; mild, moderate, or severe)     *No Answer* 7. RECURRENT SYMPTOM: Have you ever had this type of stomach pain before? If Yes, ask: When was the last time? and What happened that time?      *No Answer* 8. CAUSE: What do you think is causing the stomach pain? (e.g., gallstones, recent abdominal surgery)     *No Answer* 9. RELIEVING/AGGRAVATING FACTORS: What makes it better or worse? (e.g., antacids, bending or twisting motion, bowel movement)     *No Answer* 10. OTHER SYMPTOMS: Do you have any other symptoms? (e.g., back pain, diarrhea, fever, urination pain, vomiting)  Answer Assessment -  Initial Assessment Questions 1. DRUG NAME: What medicine do you need to have refilled?     Norco. Patient seen recently for rib fx. Was told to call in and report pain and ask for refill. Asked for a number on the pain scale but spouse didn't give number just that she in excrutiating pain. Patient's husband said they called Monday and reported pain and asked for refill.   1. LOCATION: Where does it hurt?      Rib pain  Protocols used: Abdominal Pain - Female-A-AH, Medication Refill and Renewal Call-A-AH

## 2024-10-09 NOTE — Telephone Encounter (Signed)
 Called pt husband and notified them that pcp is gone the rest of the day  States the pain is a 10 told them I would remind pcp first thing in the morning to refill and will call them when its done

## 2024-10-09 NOTE — Addendum Note (Signed)
 Addended by: DORINA DALLAS HERO on: 10/09/2024 02:39 PM   Modules accepted: Orders

## 2024-10-10 ENCOUNTER — Ambulatory Visit

## 2024-10-13 NOTE — Telephone Encounter (Signed)
 120 tab rx sent just recently. So should not be out just yet? See that rx.

## 2024-10-14 ENCOUNTER — Ambulatory Visit: Admitting: Neurology

## 2024-10-14 ENCOUNTER — Encounter: Payer: Self-pay | Admitting: Neurology

## 2024-10-14 VITALS — BP 140/62 | HR 68 | Ht 61.0 in | Wt 107.0 lb

## 2024-10-14 DIAGNOSIS — G3184 Mild cognitive impairment, so stated: Secondary | ICD-10-CM
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
## 2024-10-14 NOTE — Patient Instructions (Addendum)
 Will check dementia lab including ATN profile, TSH Continue current medications Consider setting up care with a therapist regarding your depressive symptoms Continue to follow with PCP Return in 1 year or sooner if worse.  There are well-accepted and sensible ways to reduce risk for Alzheimers disease and other degenerative brain disorders .  Exercise Daily Walk A daily 20 minute walk should be part of your routine. Disease related apathy can be a significant roadblock to exercise and the only way to overcome this is to make it a daily routine and perhaps have a reward at the end (something your loved one loves to eat or drink perhaps) or a personal trainer coming to the home can also be very useful. Most importantly, the patient is much more likely to exercise if the caregiver / spouse does it with him/her. In general a structured, repetitive schedule is best.  General Health: Any diseases which effect your body will effect your brain such as a pneumonia, urinary infection, blood clot, heart attack or stroke. Keep contact with your primary care doctor for regular follow ups.  Sleep. A good nights sleep is healthy for the brain. Seven hours is recommended. If you have insomnia or poor sleep habits we can give you some instructions. If you have sleep apnea wear your mask.  Diet: Eating a heart healthy diet is also a good idea; fish and poultry instead of red meat, nuts (mostly non-peanuts), vegetables, fruits, olive oil or canola oil (instead of butter), minimal salt (use other spices to flavor foods), whole grain rice, bread, cereal and pasta and wine in moderation.Research is now showing that the MIND diet, which is a combination of The Mediterranean diet and the DASH diet, is beneficial for cognitive processing and longevity. Information about this diet can be found in The MIND Diet, a book by Annitta Feeling, MS, RDN, and online at wildwildscience.es  Finances, Power of  [address] and Advance Directives: You should consider putting legal safeguards in place with regard to financial and medical decision making. While the spouse always has power of attorney for medical and financial issues in the absence of any form, you should consider what you want in case the spouse / caregiver is no longer around or capable of making decisions.

## 2024-10-17 ENCOUNTER — Ambulatory Visit

## 2024-10-17 VITALS — BP 139/63 | HR 64 | Temp 97.5°F | Resp 16 | Ht 61.0 in | Wt 104.8 lb

## 2024-10-17 DIAGNOSIS — E782 Mixed hyperlipidemia: Secondary | ICD-10-CM

## 2024-10-17 DIAGNOSIS — Z951 Presence of aortocoronary bypass graft: Secondary | ICD-10-CM

## 2024-10-17 LAB — THYROID PANEL WITH TSH
Free Thyroxine Index: 2.8 (ref 1.2–4.9)
T3 Uptake Ratio: 27 % (ref 24–39)
T4, Total: 10.2 ug/dL (ref 4.5–12.0)
TSH: 2.8 u[IU]/mL (ref 0.450–4.500)

## 2024-10-17 LAB — ATN PROFILE
A -- Beta-amyloid 42/40 Ratio: 0.124 (ref >0.102)
Beta-amyloid 40: 335.76 pg/mL
Beta-amyloid 42: 41.48 pg/mL
N -- NfL, Plasma: 8.91 pg/mL — ABNORMAL HIGH (ref 0.00–6.04)
T -- p-tau181: 1.66 pg/mL — ABNORMAL HIGH (ref 0.00–0.97)

## 2024-10-17 MED ORDER — INCLISIRAN SODIUM 284 MG/1.5ML ~~LOC~~ SOSY
284.0000 mg | PREFILLED_SYRINGE | Freq: Once | SUBCUTANEOUS | Status: AC
  Administered 2024-10-17: 284 mg via SUBCUTANEOUS
  Filled 2024-10-17: qty 1.5

## 2024-10-17 NOTE — Progress Notes (Signed)
 Diagnosis: Hyperlipidemia  Provider:  Chilton Greathouse MD  Procedure: Injection  Leqvio (inclisiran), Dose: 284 mg, Site: subcutaneous, Number of injections: 1  Injection Site(s): Right arm  Post Care: Patient declined observation  Discharge: Condition: Good, Destination: Home . AVS Provided  Performed by:  Loney Hering, LPN

## 2024-10-20 ENCOUNTER — Ambulatory Visit: Payer: Self-pay | Admitting: Neurology

## 2024-10-21 NOTE — Progress Notes (Signed)
 Please call and advise the patient that the recent labs we checked  did not show presence of Alzheimer disease biomarkers. Her memory loss can be due to depression or another form of Dementia such as Vascular. Please continue with PCP, encourage to set up care with a therapist/psychiatrist and call us  if your symptoms do get worse.   Bucky Grigg, MD

## 2024-10-24 ENCOUNTER — Ambulatory Visit: Admitting: Internal Medicine

## 2024-10-28 ENCOUNTER — Inpatient Hospital Stay

## 2024-10-29 ENCOUNTER — Other Ambulatory Visit: Payer: Self-pay | Admitting: Medical

## 2024-11-07 ENCOUNTER — Telehealth: Payer: Self-pay

## 2024-11-07 NOTE — Telephone Encounter (Signed)
 Auth Submission: NO AUTH NEEDED Site of care: Site of care: [facility] Payer: Medicare A/B with BCBS supplement Medication & CPT/J Code(s) submitted: Leqvio  (Inclisiran) J1306 Diagnosis Code:  Route of submission (phone, fax, portal):  Phone # Fax # Auth type: Buy/Bill PB Units/visits requested: 284mg  x 2 doses Reference number:  Approval from: 11/07/24 to 12/06/25

## 2024-11-11 ENCOUNTER — Telehealth: Payer: Self-pay | Admitting: Oncology

## 2024-11-11 ENCOUNTER — Other Ambulatory Visit: Payer: Self-pay | Admitting: Pharmacist

## 2024-11-11 NOTE — Telephone Encounter (Signed)
 Pts spouse walked into [facility] to check the status of a referral on this pt to transfer her care to our campus. Upon review, I did not see a referral in EPIC requesting a transfer or an in-basket msg from [facility].  I was able to schedule pt for a transfer of care new consult with labs for next week to at least get her seen, disclosed if she needed anything else at that visit that would be something the MD would disclose at time of service. Spouse was appreciative that we could get her seen soon, left with an appt calendar.

## 2024-11-13 ENCOUNTER — Other Ambulatory Visit: Payer: Self-pay | Admitting: Internal Medicine

## 2024-11-13 DIAGNOSIS — M797 Fibromyalgia: Secondary | ICD-10-CM

## 2024-11-14 ENCOUNTER — Ambulatory Visit: Payer: Self-pay

## 2024-11-14 NOTE — Telephone Encounter (Signed)
 Appt scheduled

## 2024-11-14 NOTE — Telephone Encounter (Signed)
 FYI Only or Action Required?: FYI only for provider: appointment scheduled on 11/17/24.  Patient was last seen in primary care on 10/01/2024 by Dorina Loving, PA-C.  Called Nurse Triage reporting Bleeding/Bruising.  Symptoms began a week ago.  Interventions attempted: Nothing.  Symptoms are: unchanged.  Triage Disposition: See PCP When Office is Open (Within 3 Days)  Patient/caregiver understands and will follow disposition?: Yes     Copied from CRM #[id number]. Topic: Clinical - Red Word Triage >> Nov 14, 2024 12:00 PM Suzen RAMAN wrote: Red Word that prompted transfer to Nurse Triage: bruise on arm... achiness rated at 7, started a  couple weeks ago started at lower part of arm and now has traveled up arm requesting an appt    Reason for Disposition  [1] After 10 days AND [2] bruise not fading  Answer Assessment - Initial Assessment Questions Pt called in to report worsening bruise on her L arm that is achy, 7/10 pain with touch. Pt denies any fever, no numbness or tingling in extremity, no SOB. Denies taking ASA or bloodthinners. Pt reports she did have a fall a couple weeks ago but at the time no injury or bruising. States that bruise started on her lower arm and seems to be worsening up her arm. Pt's husband states she does not have a hx of blood clots but that she did have something similar a couple years ago and u/s was negative for blood clot so unsure why pt is bruising. Based on no distress, no symptoms r/t DVT, pt scheduled for soonest available. Appointment scheduled for evaluation. Patient agrees with plan of care, and will call back if anything changes, or if symptoms worsen.      1. APPEARANCE of BRUISE: Describe the bruise.      Darkening, achy bruise on L arm   2. SIZE: How large is the bruise?      Started on lower l arm and moving up   3. NUMBER: How many bruises are there?      1   4. LOCATION: Where is the bruise located?      L arm   5. ONSET:  How long ago did the bruise occur?      Over a week ago; pt reports a fall several weeks ago but no injury or bruising at that time  6. CAUSE: What do you think caused the bruise?     Unsure    8. MEDICINES: Do you take any medicines which thin the blood such as: aspirin , apixaban, heparin , ibuprofen (NSAIDS), Plavix, or Coumadin?     No   9. OTHER SYMPTOMS: Do you have any other symptoms?  (e.g., weakness, dizziness, pain, fever, nosebleed, blood in urine/stool)     None  Protocols used: Bruises-A-AH

## 2024-11-17 ENCOUNTER — Encounter: Payer: Self-pay | Admitting: Family Medicine

## 2024-11-17 ENCOUNTER — Ambulatory Visit: Admitting: Family Medicine

## 2024-11-17 VITALS — BP 136/68 | HR 75 | Temp 97.6°F | Resp 16 | Ht 61.0 in | Wt 106.0 lb

## 2024-11-17 DIAGNOSIS — R58 Hemorrhage, not elsewhere classified: Secondary | ICD-10-CM
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
## 2024-11-17 NOTE — Patient Instructions (Addendum)
 Send me a message in 2 weeks if we are not improving.   Let us  know if you need anything.

## 2024-11-18 ENCOUNTER — Other Ambulatory Visit: Payer: Self-pay | Admitting: Medical

## 2024-11-19 ENCOUNTER — Other Ambulatory Visit: Payer: Self-pay | Admitting: Oncology

## 2024-11-19 DIAGNOSIS — D500 Iron deficiency anemia secondary to blood loss (chronic): Secondary | ICD-10-CM

## 2024-11-20 ENCOUNTER — Inpatient Hospital Stay

## 2024-11-20 ENCOUNTER — Inpatient Hospital Stay: Attending: Hematology & Oncology

## 2024-11-20 ENCOUNTER — Inpatient Hospital Stay: Admitting: Oncology

## 2024-11-20 DIAGNOSIS — Z801 Family history of malignant neoplasm of trachea, bronchus and lung: Secondary | ICD-10-CM | POA: Insufficient documentation

## 2024-11-20 DIAGNOSIS — D631 Anemia in chronic kidney disease: Secondary | ICD-10-CM

## 2024-11-20 DIAGNOSIS — D649 Anemia, unspecified: Secondary | ICD-10-CM | POA: Diagnosis not present

## 2024-11-20 DIAGNOSIS — N189 Chronic kidney disease, unspecified: Secondary | ICD-10-CM

## 2024-11-20 DIAGNOSIS — Z87891 Personal history of nicotine dependence: Secondary | ICD-10-CM | POA: Diagnosis not present

## 2024-11-20 DIAGNOSIS — D500 Iron deficiency anemia secondary to blood loss (chronic): Secondary | ICD-10-CM

## 2024-11-20 LAB — FOLATE: Folate: 6.7 ng/mL

## 2024-11-20 LAB — CMP (CANCER CENTER ONLY)
ALT: 13 U/L (ref 0–44)
AST: 32 U/L (ref 15–41)
Albumin: 4.2 g/dL (ref 3.5–5.0)
Alkaline Phosphatase: 57 U/L (ref 38–126)
Anion gap: 13 (ref 5–15)
BUN: 29 mg/dL — ABNORMAL HIGH (ref 8–23)
CO2: 22 mmol/L (ref 22–32)
Calcium: 9.7 mg/dL (ref 8.9–10.3)
Chloride: 104 mmol/L (ref 98–111)
Creatinine: 1.74 mg/dL — ABNORMAL HIGH (ref 0.44–1.00)
GFR, Estimated: 31 mL/min — ABNORMAL LOW
Glucose, Bld: 225 mg/dL — ABNORMAL HIGH (ref 70–99)
Potassium: 3.8 mmol/L (ref 3.5–5.1)
Sodium: 139 mmol/L (ref 135–145)
Total Bilirubin: 0.4 mg/dL (ref 0.0–1.2)
Total Protein: 6.8 g/dL (ref 6.5–8.1)

## 2024-11-20 LAB — VITAMIN B12: Vitamin B-12: 1258 pg/mL — ABNORMAL HIGH (ref 180–914)

## 2024-11-20 LAB — FERRITIN: Ferritin: 527 ng/mL — ABNORMAL HIGH (ref 11–307)

## 2024-11-20 LAB — CBC WITH DIFFERENTIAL (CANCER CENTER ONLY)
Abs Immature Granulocytes: 0.04 K/uL (ref 0.00–0.07)
Basophils Absolute: 0.1 K/uL (ref 0.0–0.1)
Basophils Relative: 2 %
Eosinophils Absolute: 0.1 K/uL (ref 0.0–0.5)
Eosinophils Relative: 3 %
HCT: 29 % — ABNORMAL LOW (ref 36.0–46.0)
Hemoglobin: 9.4 g/dL — ABNORMAL LOW (ref 12.0–15.0)
Immature Granulocytes: 1 %
Lymphocytes Relative: 11 %
Lymphs Abs: 0.3 K/uL — ABNORMAL LOW (ref 0.7–4.0)
MCH: 28.1 pg (ref 26.0–34.0)
MCHC: 32.4 g/dL (ref 30.0–36.0)
MCV: 86.8 fL (ref 80.0–100.0)
Monocytes Absolute: 0.1 K/uL (ref 0.1–1.0)
Monocytes Relative: 3 %
Neutro Abs: 2.6 K/uL (ref 1.7–7.7)
Neutrophils Relative %: 80 %
Platelet Count: 264 K/uL (ref 150–400)
RBC: 3.34 MIL/uL — ABNORMAL LOW (ref 3.87–5.11)
RDW: 14.5 % (ref 11.5–15.5)
WBC Count: 3.2 K/uL — ABNORMAL LOW (ref 4.0–10.5)
nRBC: 0 % (ref 0.0–0.2)

## 2024-11-20 LAB — IRON AND TIBC
Iron: 93 ug/dL (ref 28–170)
Saturation Ratios: 22 % (ref 10.4–31.8)
TIBC: 419 ug/dL (ref 250–450)
UIBC: 325 ug/dL

## 2024-11-21 ENCOUNTER — Other Ambulatory Visit: Payer: Self-pay | Admitting: Internal Medicine

## 2024-11-21 ENCOUNTER — Encounter: Payer: Self-pay | Admitting: Cardiology

## 2024-11-21 ENCOUNTER — Encounter: Payer: Self-pay | Admitting: Oncology

## 2024-11-21 DIAGNOSIS — D631 Anemia in chronic kidney disease: Secondary | ICD-10-CM | POA: Insufficient documentation

## 2024-11-21 DIAGNOSIS — M797 Fibromyalgia: Secondary | ICD-10-CM

## 2024-11-21 LAB — SOLUBLE TRANSFERRIN RECEPTOR: Transferrin Receptor: 11.8 nmol/L — ABNORMAL LOW (ref 12.2–27.3)
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
## 2024-11-21 NOTE — Telephone Encounter (Signed)
 Last Fill: 06/30/2024  Next Visit: 12/09/2024  Last Visit: 06/30/2024  Dx: Fibromyalgia   Current Dose per office note on on 06/30/2024: Plan: cyclobenzaprine  (FLEXERIL ) 5 MG tablet   Okay to refill Flexeril ?

## 2024-11-24 LAB — PROTEIN ELECTROPHORESIS, SERUM
A/G Ratio: 1 (ref 0.7–1.7)
Albumin ELP: 3.6 g/dL (ref 2.9–4.4)
Alpha-1-Globulin: 0.3 g/dL (ref 0.0–0.4)
Alpha-2-Globulin: 0.8 g/dL (ref 0.4–1.0)
Beta Globulin: 1.1 g/dL (ref 0.7–1.3)
Gamma Globulin: 1.4 g/dL (ref 0.4–1.8)
Globulin, Total: 3.5 g/dL (ref 2.2–3.9)
Total Protein ELP: 7.1 g/dL (ref 6.0–8.5)

## 2024-11-26 NOTE — Progress Notes (Unsigned)
 " [facility] at [facility] [address] [place],  [place]  [address] [phone]  Clinic Day: 11/27/2024  Referring physician: Dorina Loving, PA-C   HISTORY OF PRESENT ILLNESS:  The patient is a 74 y.o. female  who I recently began seeing for the continued the management of her anemia.  In the past, she has received Aranesp  injections for her anemia as it was primarily due to her baseline kidney disease.  She comes in today to review her recent labs to ensure there are no other issues factoring into her anemia.  Since her last visit, the patient has been doing okay.  She denies having any overt forms of blood loss to explain her anemia.    PHYSICAL EXAM:  Blood pressure (!) 120/45, pulse 67, temperature 99.1 F (37.3 C), temperature source Oral, resp. rate 14, height 5' 1 (1.549 m), weight 106 lb 1.6 oz (48.1 kg), SpO2 100%. Wt Readings from Last 3 Encounters:  11/27/24 106 lb 1.6 oz (48.1 kg)  11/17/24 106 lb (48.1 kg)  10/17/24 104 lb 12.8 oz (47.5 kg)   Body mass index is 20.05 kg/m. Performance status (ECOG): 2 - Symptomatic, <50% confined to bed Physical Exam Constitutional:      Appearance: Normal appearance. She is not ill-appearing.     Comments: A chronically ill-appearing older woman in no acute distress  HENT:     Mouth/Throat:     Mouth: Mucous membranes are moist.     Pharynx: Oropharynx is clear. No oropharyngeal exudate or posterior oropharyngeal erythema.  Cardiovascular:     Rate and Rhythm: Normal rate and regular rhythm.     Heart sounds: No murmur heard.    No friction rub. No gallop.  Pulmonary:     Effort: Pulmonary effort is normal. No respiratory distress.     Breath sounds: Decreased air movement present. Decreased breath sounds present. No wheezing, rhonchi or rales.  Abdominal:     General: Bowel sounds are normal. There is no distension.     Palpations: Abdomen is soft. There is no mass.     Tenderness: There is no abdominal  tenderness.  Musculoskeletal:        General: No swelling.     Right lower leg: No edema.     Left lower leg: No edema.  Lymphadenopathy:     Cervical: No cervical adenopathy.     Upper Body:     Right upper body: No supraclavicular or axillary adenopathy.     Left upper body: No supraclavicular or axillary adenopathy.     Lower Body: No right inguinal adenopathy. No left inguinal adenopathy.  Skin:    General: Skin is warm.     Coloration: Skin is not jaundiced.     Findings: No lesion or rash.  Neurological:     General: No focal deficit present.     Mental Status: She is alert and oriented to person, place, and time. Mental status is at baseline.  Psychiatric:        Mood and Affect: Mood normal.        Behavior: Behavior normal.        Thought Content: Thought content normal.    LABS:      Latest Ref Rng & Units 11/27/2024   11:06 AM 11/20/2024    2:35 PM 09/18/2024   10:28 AM  CBC  WBC 4.0 - 10.5 K/uL 3.8  3.2  4.5   Hemoglobin 12.0 - 15.0 g/dL 10.2  9.4  11.3   Hematocrit 36.0 - 46.0 % 31.2  29.0  34.4   Platelets 150 - 400 K/uL 242  264  263       Latest Ref Rng & Units 11/27/2024   11:06 AM 11/20/2024    2:35 PM 09/18/2024   10:28 AM  CMP  Glucose 70 - 99 mg/dL 879  774  760   BUN 8 - 23 mg/dL 33  29  23   Creatinine 0.44 - 1.00 mg/dL 8.13  8.25  8.46   Sodium 135 - 145 mmol/L 137  139  134   Potassium 3.5 - 5.1 mmol/L 4.3  3.8  4.6   Chloride 98 - 111 mmol/L 105  104  102   CO2 22 - 32 mmol/L 19  22  21    Calcium  8.9 - 10.3 mg/dL 9.4  9.7  9.5   Total Protein 6.5 - 8.1 g/dL 7.6  6.8  7.0   Total Bilirubin 0.0 - 1.2 mg/dL 0.5  0.4  0.4   Alkaline Phos 38 - 126 U/L 48  57  45   AST 15 - 41 U/L 40  32  25   ALT 0 - 44 U/L 11  13  6      Latest Reference Range & Units 11/20/24 14:35 11/20/24 14:36  Iron 28 - 170 ug/dL  93  UIBC ug/dL  674  TIBC 749 - 549 ug/dL  580  Saturation Ratios 10.4 - 31.8 %  22  Ferritin 11 - 307 ng/mL  527 (H)  Folate >5.9 ng/mL   6.7  Transferrin Receptor 12.2 - 27.3 nmol/L 11.8 (L)   Vitamin B12 180 - 914 pg/mL 1,258 (H)   (H): Data is abnormally high (L): Data is abnormally low  Latest Reference Range & Units 11/20/24 14:36  Total Protein ELP 6.0 - 8.5 g/dL 7.1  Albumin  ELP 2.9 - 4.4 g/dL 3.6  Globulin, Total 2.2 - 3.9 g/dL 3.5 (C)  A/G Ratio 0.7 - 1.7  1.0 (C)  Alpha-1-Globulin 0.0 - 0.4 g/dL 0.3  Joeyj-7-Honalopw 0.4 - 1.0 g/dL 0.8  Beta Globulin 0.7 - 1.3 g/dL 1.1  Gamma Globulin 0.4 - 1.8 g/dL 1.4  M-SPIKE, % Not Observed g/dL Not Observed  (C): Corrected  ASSESSMENT & PLAN:  A 73 y.o. female with anemia secondary to chronic renal insufficiency.  In clinic today, I went over all of her labs with her, which showed no evidence of any nutritional deficiencies or a possible plasma cell dyscrasia.  Moving forward, the plan will be to give her monthly Retacrit injections whenever her hemoglobin falls below 10.  As it is 10.2 today, red cell shot therapy will be held for now.  Her CBC will be checked monthly to determine if Retacrit therapy will be given.  I will see her back in 3 months for repeat clinical assessment.  The patient understands all the plans discussed today and is in agreement with them.  Kamillah Didonato DELENA Kerns, MD        "

## 2024-11-27 ENCOUNTER — Inpatient Hospital Stay (HOSPITAL_BASED_OUTPATIENT_CLINIC_OR_DEPARTMENT_OTHER): Admitting: Oncology

## 2024-11-27 ENCOUNTER — Other Ambulatory Visit: Payer: Self-pay | Admitting: Oncology

## 2024-11-27 ENCOUNTER — Inpatient Hospital Stay

## 2024-11-27 ENCOUNTER — Other Ambulatory Visit: Payer: Self-pay | Admitting: Medical

## 2024-11-27 ENCOUNTER — Other Ambulatory Visit: Payer: Self-pay

## 2024-11-27 VITALS — BP 120/45 | HR 67 | Temp 99.1°F | Resp 14 | Ht 61.0 in | Wt 106.1 lb

## 2024-11-27 DIAGNOSIS — D649 Anemia, unspecified: Secondary | ICD-10-CM | POA: Diagnosis not present

## 2024-11-27 DIAGNOSIS — D631 Anemia in chronic kidney disease: Secondary | ICD-10-CM

## 2024-11-27 DIAGNOSIS — N189 Chronic kidney disease, unspecified: Secondary | ICD-10-CM | POA: Diagnosis not present

## 2024-11-27 LAB — CBC WITH DIFFERENTIAL (CANCER CENTER ONLY)
Abs Immature Granulocytes: 0.04 K/uL (ref 0.00–0.07)
Basophils Absolute: 0 K/uL (ref 0.0–0.1)
Basophils Relative: 1 %
Eosinophils Absolute: 0.1 K/uL (ref 0.0–0.5)
Eosinophils Relative: 1 %
HCT: 31.2 % — ABNORMAL LOW (ref 36.0–46.0)
Hemoglobin: 10.2 g/dL — ABNORMAL LOW (ref 12.0–15.0)
Immature Granulocytes: 1 %
Lymphocytes Relative: 10 %
Lymphs Abs: 0.4 K/uL — ABNORMAL LOW (ref 0.7–4.0)
MCH: 28 pg (ref 26.0–34.0)
MCHC: 32.7 g/dL (ref 30.0–36.0)
MCV: 85.7 fL (ref 80.0–100.0)
Monocytes Absolute: 0.1 K/uL (ref 0.1–1.0)
Monocytes Relative: 4 %
Neutro Abs: 3.1 K/uL (ref 1.7–7.7)
Neutrophils Relative %: 83 %
Platelet Count: 242 K/uL (ref 150–400)
RBC: 3.64 MIL/uL — ABNORMAL LOW (ref 3.87–5.11)
RDW: 14.2 % (ref 11.5–15.5)
WBC Count: 3.8 K/uL — ABNORMAL LOW (ref 4.0–10.5)
nRBC: 0 % (ref 0.0–0.2)

## 2024-11-27 LAB — CMP (CANCER CENTER ONLY)
ALT: 11 U/L (ref 0–44)
AST: 40 U/L (ref 15–41)
Albumin: 4 g/dL (ref 3.5–5.0)
Alkaline Phosphatase: 48 U/L (ref 38–126)
Anion gap: 14 (ref 5–15)
BUN: 33 mg/dL — ABNORMAL HIGH (ref 8–23)
CO2: 19 mmol/L — ABNORMAL LOW (ref 22–32)
Calcium: 9.4 mg/dL (ref 8.9–10.3)
Chloride: 105 mmol/L (ref 98–111)
Creatinine: 1.86 mg/dL — ABNORMAL HIGH (ref 0.44–1.00)
GFR, Estimated: 28 mL/min — ABNORMAL LOW
Glucose, Bld: 120 mg/dL — ABNORMAL HIGH (ref 70–99)
Potassium: 4.3 mmol/L (ref 3.5–5.1)
Sodium: 137 mmol/L (ref 135–145)
Total Bilirubin: 0.5 mg/dL (ref 0.0–1.2)
Total Protein: 7.6 g/dL (ref 6.5–8.1)

## 2024-11-27 NOTE — Assessment & Plan Note (Signed)
 Jasmine Keith

## 2024-11-27 NOTE — Progress Notes (Signed)
 Per Dr. Ezzard: hold Aranesp  today
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
## 2024-11-27 NOTE — Assessment & Plan Note (Signed)
 [name]

## 2024-11-30 ENCOUNTER — Encounter: Payer: Self-pay | Admitting: Oncology

## 2024-11-30 ENCOUNTER — Encounter: Payer: Self-pay | Admitting: Cardiology

## 2024-12-04 ENCOUNTER — Ambulatory Visit: Payer: Self-pay

## 2024-12-04 NOTE — Telephone Encounter (Signed)
 FYI Only or Action Required?: FYI only for provider: appointment scheduled on 1/30.  Patient was last seen in primary care on 11/17/2024 by Frann Mabel Mt, DO.  Called Nurse Triage reporting Shoulder Pain.  Symptoms began caller won't directly answer, states ongoing from her lupus.  Interventions attempted: [name] Nothing Symptoms are: gradually worsening.  Triage Disposition: See PCP When Office is Open (Within 3 Days)  Patient/caregiver understands and will follow disposition?: Yes, will follow disposition  Reason for Triage: severe pain in right shoulder    Reason for Disposition  [1] MODERATE pain (e.g., interferes with normal activities) AND [2] present > 3 days  Answer Assessment - Initial Assessment Questions 1. ONSET: When did the pain start?     ongoing 2. LOCATION: Where is the pain located?     R shoulder 3. PAIN: How bad is the pain? (Scale 1-10; or mild, moderate, severe)     severe  Pt has lupus states that this is ongoing, requesting appt. Denies SOB, denies injury, denies N/V.  Protocols used: Shoulder Pain-A-AH

## 2024-12-04 NOTE — Telephone Encounter (Signed)
 Apt made for 12/05/2024

## 2024-12-05 ENCOUNTER — Ambulatory Visit (HOSPITAL_BASED_OUTPATIENT_CLINIC_OR_DEPARTMENT_OTHER)
Admission: RE | Admit: 2024-12-05 | Discharge: 2024-12-05 | Disposition: A | Source: Ambulatory Visit | Attending: Medical | Admitting: Medical

## 2024-12-05 ENCOUNTER — Ambulatory Visit: Admitting: Medical

## 2024-12-05 VITALS — BP 138/58 | HR 61 | Temp 97.9°F | Resp 12 | Ht 61.0 in | Wt 108.2 lb

## 2024-12-05 DIAGNOSIS — R5383 Other fatigue: Secondary | ICD-10-CM

## 2024-12-05 DIAGNOSIS — M79601 Pain in right arm: Secondary | ICD-10-CM

## 2024-12-05 DIAGNOSIS — Z794 Long term (current) use of insulin: Secondary | ICD-10-CM | POA: Diagnosis not present

## 2024-12-05 DIAGNOSIS — M25511 Pain in right shoulder: Secondary | ICD-10-CM | POA: Insufficient documentation

## 2024-12-05 DIAGNOSIS — E1122 Type 2 diabetes mellitus with diabetic chronic kidney disease: Secondary | ICD-10-CM

## 2024-12-05 DIAGNOSIS — T148XXA Other injury of unspecified body region, initial encounter: Secondary | ICD-10-CM | POA: Diagnosis not present
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
## 2024-12-05 NOTE — Addendum Note (Signed)
 Addended by: GERARD CHUCKIE SAILOR on: 12/05/2024 01:41 PM   Modules accepted: Orders

## 2024-12-05 NOTE — Patient Instructions (Signed)
 Acute right shoulder and upper extremity pain(axillary/ medial upper arm junction) with bruising Right shoulder pain , No trauma. Differential includes blood clot, unlikely given history. Pain worsens with arm movement. DVT for caution sake as heading toward 3 day weekend. - Ordered right shoulder x-ray. - Ordered right upper extremity ultrasound. -- Continue Tylenol  for pain management.   Bruising rt mid forearm to proximal distal bicep. scattered small bruises rt triceps area - Ordered CBC, PT, INR for bruising evaluation. - Continue Tylenol  for pain management.   Fatigue Persistent fatigue possibly related to lupus treatment. B12 levels high due to supplementation. - Ordered metabolic panel, CBC, thyroid  studies, and B12 level for fatigue evaluation. - Adjust B12 supplementation to 1000 mcg four days a week.  Type 2 diabetes mellitus with chronic kidney disease Type 2 diabetes with CKD. Insulin  regimen at 30 units daily. Glycemic control concerns with potential steroid use for shoulder pain. - Ordered A1c to assess glycemic control. - Consider low-dose steroid taper for shoulder pain if A1c indicates adequate control.  Follow up date to be determined after lab review

## 2024-12-06 ENCOUNTER — Ambulatory Visit: Payer: Self-pay | Admitting: Medical

## 2024-12-06 DIAGNOSIS — I808 Phlebitis and thrombophlebitis of other sites: Secondary | ICD-10-CM

## 2024-12-06 LAB — COMPREHENSIVE METABOLIC PANEL WITH GFR
AG Ratio: 1.3 (calc) (ref 1.0–2.5)
ALT: 11 U/L (ref 6–29)
AST: 23 U/L (ref 10–35)
Albumin: 4.3 g/dL (ref 3.6–5.1)
Alkaline phosphatase (APISO): 61 U/L (ref 37–153)
BUN/Creatinine Ratio: 18 (calc) (ref 6–22)
BUN: 34 mg/dL — ABNORMAL HIGH (ref 7–25)
CO2: 21 mmol/L (ref 20–32)
Calcium: 9.8 mg/dL (ref 8.6–10.4)
Chloride: 103 mmol/L (ref 98–110)
Creat: 1.87 mg/dL — ABNORMAL HIGH (ref 0.60–1.00)
Globulin: 3.3 g/dL (ref 1.9–3.7)
Glucose, Bld: 212 mg/dL — ABNORMAL HIGH (ref 65–99)
Potassium: 4.7 mmol/L (ref 3.5–5.3)
Sodium: 137 mmol/L (ref 135–146)
Total Bilirubin: 0.5 mg/dL (ref 0.2–1.2)
Total Protein: 7.6 g/dL (ref 6.1–8.1)
eGFR: 28 mL/min/{1.73_m2} — ABNORMAL LOW

## 2024-12-06 LAB — CBC WITH DIFFERENTIAL/PLATELET
Absolute Lymphocytes: 302 {cells}/uL — ABNORMAL LOW (ref 850–3900)
Absolute Monocytes: 70 {cells}/uL — ABNORMAL LOW (ref 200–950)
Basophils Absolute: 31 {cells}/uL (ref 0–200)
Basophils Relative: 1.1 %
Eosinophils Absolute: 62 {cells}/uL (ref 15–500)
Eosinophils Relative: 2.2 %
HCT: 30.5 % — ABNORMAL LOW (ref 35.9–46.0)
Hemoglobin: 9.8 g/dL — ABNORMAL LOW (ref 11.7–15.5)
MCH: 28.7 pg (ref 27.0–33.0)
MCHC: 32.1 g/dL (ref 31.6–35.4)
MCV: 89.4 fL (ref 81.4–101.7)
MPV: 11.5 fL (ref 7.5–12.5)
Monocytes Relative: 2.5 %
Neutro Abs: 2335 {cells}/uL (ref 1500–7800)
Neutrophils Relative %: 83.4 %
Platelets: 232 10*3/uL (ref 140–400)
RBC: 3.41 Million/uL — ABNORMAL LOW (ref 3.80–5.10)
RDW: 13.3 % (ref 11.0–15.0)
Total Lymphocyte: 10.8 %
WBC: 2.8 10*3/uL — ABNORMAL LOW (ref 3.8–10.8)

## 2024-12-06 LAB — HEMOGLOBIN A1C
Hgb A1c MFr Bld: 6.3 % — ABNORMAL HIGH
Mean Plasma Glucose: 134 mg/dL
eAG (mmol/L): 7.4 mmol/L

## 2024-12-06 LAB — TSH: TSH: 5.32 m[IU]/L — ABNORMAL HIGH (ref 0.40–4.50)

## 2024-12-06 LAB — PROTIME-INR
INR: 1.2 — ABNORMAL HIGH
Prothrombin Time: 12.4 s — ABNORMAL HIGH (ref 9.0–11.5)

## 2024-12-06 LAB — SPECIMEN COMPROMISED

## 2024-12-06 LAB — T4, FREE: Free T4: 1.4 ng/dL (ref 0.8–1.8)

## 2024-12-06 MED ORDER — METHYLPREDNISOLONE 4 MG PO TABS: ORAL_TABLET | ORAL | 0 refills | Status: AC

## 2024-12-06 NOTE — Addendum Note (Signed)
 Addended by: DORINA DALLAS DORINA PA-C M on: 12/06/2024 01:22 PM   Modules accepted: Orders

## 2024-12-08 ENCOUNTER — Ambulatory Visit (HOSPITAL_BASED_OUTPATIENT_CLINIC_OR_DEPARTMENT_OTHER)

## 2024-12-08 ENCOUNTER — Ambulatory Visit: Payer: Self-pay

## 2024-12-08 NOTE — Telephone Encounter (Signed)
" ° °  Summary: Right shoulder pain   Reason for Triage: Pt spouse calling reports she is having right shoulder pain.  States pt had an xray last week, 12/04/24, and has not received results.  Pt spouse reports she is in pain, and needs something for pain.         Answer Assessment - Initial Assessment Questions Twyla Nottingham the message from Dr Dorina to [facility] re: prednisone .  Advised Xray showed tendon inflammation and if steroids didn't help would refer to sports med. Nottingham was appreciative of the info. Will call back if no improvement in a few days.  1. MAIN CONCERN OR SYMPTOM:  What is your main concern right now? What question do you have? What's the main symptom you're worried about? (e.g., breathing difficulty, cough, fever, pain)     No call back from xray done 1/30 2. ONSET: When did the  shoulder pain  start?     Days ago 3. BETTER-SAME-WORSE: Are you getting better, staying the same, or getting worse compared to how you felt at your last visit to the doctor (most recent medical visit)?     same 4. VISIT DATE: When were you seen? (e.g., date)     1/30 5. VISIT DOCTOR: What is the name of the doctor taking care of you now?     saguier  9. PAIN: Is there any pain? If Yes, ask: How bad is it?  (Scale 0-10; or none, mild, moderate, severe)     moderate  Protocols used: Recent Medical Visit for Illness Follow-up Call-A-[facility]  "

## 2024-12-09 ENCOUNTER — Ambulatory Visit: Admitting: Internal Medicine

## 2024-12-09 DIAGNOSIS — M329 Systemic lupus erythematosus, unspecified: Secondary | ICD-10-CM

## 2024-12-09 DIAGNOSIS — M797 Fibromyalgia: Secondary | ICD-10-CM

## 2024-12-11 ENCOUNTER — Ambulatory Visit: Payer: Self-pay

## 2024-12-11 NOTE — Telephone Encounter (Signed)
 Pt notified and also advised that they maybe can offer something tomorrow to give her relief.

## 2024-12-11 NOTE — Addendum Note (Signed)
 Addended by: DORINA DALLAS DORINA PA-C M on: 12/11/2024 12:43 PM   Modules accepted: Orders

## 2024-12-11 NOTE — Telephone Encounter (Signed)
 Husband of pt called back noting the pt's R shoulder pain is unchanged despite taking prednisone . He is requesting 1.) that Dallas call in something else for the pain and 2.) that they be referred to sports medicine as discussed with provider. Please call pt back and advise.    FYI Only or Action Required?: Action required by provider: clinical question for provider and update on patient condition.  Patient was last seen in primary care on 12/05/2024 by Dorina Dallas, PA-C.  Called Nurse Triage reporting Shoulder Pain.   Interventions attempted: Prescription medications: prednisone .  Symptoms are: unchanged.  Triage Disposition: Callback by PCP Today  Patient/caregiver understands and will follow disposition?: Yes      Reason for Triage: Patient's Husband is calling due to her Prednisone  not working.   She is severe pain still in her right shoulder.  Previously seen by PCP  Last Date of Service: 12/05/2024    Transf. To NT    Reason for Disposition  [1] MODERATE pain (e.g., interferes with normal activities) AND [2] present > 3 days  Answer Assessment - Initial Assessment Questions 1. ONSET: When did the pain start?     Ongoing shoulder pain for which she has been taking prednisone , notes it has been ineffective  2. LOCATION: Where is the pain located?     Right shoulder.   3. PAIN: How bad is the pain? (Scale 1-10; or mild, moderate, severe)     She's in a hell of a lot of pain per husband.  Protocols used: Shoulder Pain-A-AH

## 2024-12-12 ENCOUNTER — Ambulatory Visit

## 2024-12-12 ENCOUNTER — Other Ambulatory Visit: Payer: Self-pay

## 2024-12-12 VITALS — BP 100/64 | Ht 61.0 in | Wt 108.0 lb

## 2024-12-12 DIAGNOSIS — M25511 Pain in right shoulder: Secondary | ICD-10-CM

## 2024-12-12 MED ORDER — METHYLPREDNISOLONE ACETATE 40 MG/ML IJ SUSP
40.0000 mg | Freq: Once | INTRAMUSCULAR | Status: AC
  Administered 2024-12-12: 40 mg via INTRA_ARTICULAR

## 2024-12-12 NOTE — Progress Notes (Unsigned)
" ° °  Subjective:    Patient ID: [name], female    DOB: 74 y.o., 08-Jun-1951   MRN: [id number]  Chief Complaint: Right shoulder pain   History of Present Illness 74 year old female with past medical history significant for hypothyroidism, type 2 diabetes, osteoporosis, lupus presenting for evaluation of right shoulder pain.     Review of Pertinent Imaging: 12/05/2024 4 view plain film radiographs obtained of the right shoulder per my independent evaluation revealing glenohumeral joint space narrowing with osteophytic change noted at the inferior portion of the glenoid.  No significant AC joint degenerative changes.  Mild spurring at the lateralmost portion of the acromion.  Type I acromion.    Objective:   There were no vitals filed for this visit. *** Shoulder ( compared to normal ) Inspection: *** swelling, *** scapular dyskinesis Palpation: TTP *** greater tuberosity, *** AC joint, *** biceps tendon, *** posterior shoulder AROM/PROM: 180*** forward flexion, 180*** abduction, 60*** external rotation, internal rotation to lumbar spine Strength: 5/5 lift off, 5/5 empty can, 5/5 external rotation, 5/5 flexion, *** drop arm test Special tests:    -Rotator Cuff: *** Neer's, *** Hawkin's, *** empty can, *** painful arc   -Labrum: *** O'brien's, *** Jerk   -Biceps: *** speed's, *** yergason's    -AC Joint: *** cross arm testing     -Instability: *** external rotation/apprehension/relocation test, *** sulcus sign   Right intra-articular Shoulder Injection with Ultrasound Guidance Procedure Note ESTELITA ITEN 08-29-1951 Indications: Pain Procedure Details Following the description of risks including infection bleeding, damage to surrounding structures, patient provided verbal/written consent for Right glenohumeral corticosteroid injection procedure with ultrasound. US  was used to identify the glenohumeral space. Patient was sterilely prepped in the usual fashion with  chlorhexidine .  Following topical anesthetization with ethyl chloride they were injected with a solution of 40mg  Depo-medrol  and 3cc Mepivacaine 2%. This was well visualized under ultrasound, please see associated photographic documentation. Patient tolerated well without complication.  Precautions provided. Cleaned and dressing applied.     Assessment & Plan:   Assessment & Plan    "

## 2024-12-29 ENCOUNTER — Inpatient Hospital Stay

## 2025-01-20 ENCOUNTER — Ambulatory Visit: Admitting: Internal Medicine

## 2025-01-26 ENCOUNTER — Inpatient Hospital Stay

## 2025-02-25 ENCOUNTER — Inpatient Hospital Stay

## 2025-02-25 ENCOUNTER — Inpatient Hospital Stay: Admitting: Oncology

## 2025-04-17 ENCOUNTER — Ambulatory Visit

## 2025-10-20 ENCOUNTER — Ambulatory Visit: Admitting: Neurology
